# Patient Record
Sex: Male | Born: 1950 | Race: White | Hispanic: No | Marital: Married
Health system: Southern US, Academic
[De-identification: ages and names within clinical notes are randomized; demographics above are authoritative.]

## PROBLEM LIST (undated history)

## (undated) ENCOUNTER — Ambulatory Visit

## (undated) ENCOUNTER — Encounter

## (undated) ENCOUNTER — Telehealth

## (undated) ENCOUNTER — Encounter: Attending: Family | Primary: Family

## (undated) ENCOUNTER — Encounter: Payer: MEDICARE | Attending: Pulmonary Disease | Primary: Pulmonary Disease

## (undated) ENCOUNTER — Ambulatory Visit: Payer: MEDICARE | Attending: Internal Medicine | Primary: Internal Medicine

## (undated) ENCOUNTER — Encounter: Attending: Internal Medicine | Primary: Internal Medicine

## (undated) ENCOUNTER — Encounter: Attending: Pulmonary Disease | Primary: Pulmonary Disease

## (undated) ENCOUNTER — Telehealth: Attending: Pulmonary Disease | Primary: Pulmonary Disease

## (undated) ENCOUNTER — Ambulatory Visit: Payer: MEDICARE

## (undated) ENCOUNTER — Ambulatory Visit: Payer: MEDICARE | Attending: Family | Primary: Family

## (undated) ENCOUNTER — Telehealth: Attending: Registered" | Primary: Registered"

## (undated) ENCOUNTER — Encounter
Payer: MEDICARE | Attending: Student in an Organized Health Care Education/Training Program | Primary: Student in an Organized Health Care Education/Training Program

## (undated) ENCOUNTER — Telehealth: Attending: Family | Primary: Family

## (undated) ENCOUNTER — Ambulatory Visit: Payer: MEDICARE | Attending: Registered" | Primary: Registered"

## (undated) ENCOUNTER — Non-Acute Institutional Stay: Payer: MEDICARE

## (undated) ENCOUNTER — Non-Acute Institutional Stay: Payer: MEDICARE | Attending: Clinical | Primary: Clinical

## (undated) ENCOUNTER — Ambulatory Visit: Payer: MEDICARE | Attending: Pulmonary Disease | Primary: Pulmonary Disease

## (undated) ENCOUNTER — Ambulatory Visit: Payer: Medicare (Managed Care)

## (undated) ENCOUNTER — Encounter: Payer: MEDICARE | Attending: Internal Medicine | Primary: Internal Medicine

## (undated) ENCOUNTER — Encounter
Attending: Pharmacist Clinician (PhC)/ Clinical Pharmacy Specialist | Primary: Pharmacist Clinician (PhC)/ Clinical Pharmacy Specialist

## (undated) ENCOUNTER — Telehealth: Attending: Physician Assistant | Primary: Physician Assistant

## (undated) DIAGNOSIS — E119 Type 2 diabetes mellitus without complications: Secondary | ICD-10-CM

## (undated) HISTORY — PX: OTHER SURGICAL HISTORY: SHX169

## (undated) MED ORDER — IBUPROFEN 200 MG TABLET
Freq: Four times a day (QID) | ORAL | 0 days | PRN
Start: ? — End: 2020-05-28

---
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
## 2014-11-29 ENCOUNTER — Emergency Department
Admission: EM | Admit: 2014-11-29 | Discharge: 2014-11-29 | Disposition: A | Discharge status: Home / Self Care | Payer: BC Managed Care – PPO | Attending: Emergency Medicine | Admitting: Emergency Medicine

## 2014-11-29 ENCOUNTER — Emergency Department: Payer: BC Managed Care – PPO

## 2014-11-29 DIAGNOSIS — W010XXA Fall on same level from slipping, tripping and stumbling without subsequent striking against object, initial encounter: Secondary | ICD-10-CM | POA: Diagnosis not present

## 2014-11-29 DIAGNOSIS — Y9389 Activity, other specified: Secondary | ICD-10-CM | POA: Insufficient documentation

## 2014-11-29 DIAGNOSIS — D329 Benign neoplasm of meninges, unspecified: Secondary | ICD-10-CM | POA: Insufficient documentation

## 2014-11-29 DIAGNOSIS — Y998 Other external cause status: Secondary | ICD-10-CM | POA: Insufficient documentation

## 2014-11-29 DIAGNOSIS — S43015A Anterior dislocation of left humerus, initial encounter: Secondary | ICD-10-CM | POA: Insufficient documentation

## 2014-11-29 DIAGNOSIS — Z87891 Personal history of nicotine dependence: Secondary | ICD-10-CM | POA: Insufficient documentation

## 2014-11-29 DIAGNOSIS — S43005A Unspecified dislocation of left shoulder joint, initial encounter: Secondary | ICD-10-CM

## 2014-11-29 DIAGNOSIS — E119 Type 2 diabetes mellitus without complications: Secondary | ICD-10-CM | POA: Insufficient documentation

## 2014-11-29 DIAGNOSIS — Y92009 Unspecified place in unspecified non-institutional (private) residence as the place of occurrence of the external cause: Secondary | ICD-10-CM | POA: Diagnosis not present

## 2014-11-29 DIAGNOSIS — S4992XA Unspecified injury of left shoulder and upper arm, initial encounter: Secondary | ICD-10-CM | POA: Diagnosis present

## 2014-11-29 HISTORY — DX: Type 2 diabetes mellitus without complications: E11.9

## 2014-11-29 MED ORDER — ONDANSETRON HCL 4 MG/2ML IJ SOLN
INTRAMUSCULAR | Status: AC
  Administered 2014-11-29: 4 mg via INTRAVENOUS
  Filled 2014-11-29: qty 2

## 2014-11-29 MED ORDER — OXYCODONE-ACETAMINOPHEN 5-325 MG PO TABS: 1.0000 | ORAL_TABLET | ORAL | Status: AC | PRN

## 2014-11-29 MED ORDER — ONDANSETRON HCL 4 MG/2ML IJ SOLN
4.0000 mg | Freq: Once | INTRAMUSCULAR | Status: AC
  Administered 2014-11-29: 4 mg via INTRAVENOUS

## 2014-11-29 MED ORDER — MORPHINE SULFATE 2 MG/ML IJ SOLN
INTRAMUSCULAR | Status: AC
  Administered 2014-11-29: 2 mg via INTRAVENOUS
  Filled 2014-11-29: qty 1

## 2014-11-29 MED ORDER — MORPHINE SULFATE 2 MG/ML IJ SOLN
2.0000 mg | Freq: Once | INTRAMUSCULAR | Status: AC
  Administered 2014-11-29: 2 mg via INTRAVENOUS

## 2014-11-29 NOTE — Discharge Instructions (Signed)
Shoulder Dislocation Your shoulder is made up of three bones: the collar bone (clavicle); the shoulder blade (scapula), which includes the socket (glenoid cavity); and the upper arm bone (humerus). Your shoulder joint is the place where these bones meet. Strong, fibrous tissues hold these bones together (ligaments). Muscles and strong, fibrous tissues that connect the muscles to these bones (tendons) allow your arm to move through this joint. The range of motion of your shoulder joint is more extensive than most of your other joints, and the glenoid cavity is very shallow. That is the reason that your shoulder joint is one of the most unstable joints in your body. It is far more prone to dislocation than your other joints. Shoulder dislocation is when your humerus is forced out of your shoulder joint. CAUSES Shoulder dislocation is caused by a forceful impact on your shoulder. This impact usually is from an injury, such as a sports injury or a fall. SYMPTOMS Symptoms of shoulder dislocation include:  Deformity of your shoulder.  Intense pain.  Inability to move your shoulder joint.  Numbness, weakness, or tingling around your shoulder joint (your neck or down your arm).  Bruising or swelling around your shoulder. DIAGNOSIS In order to diagnose a dislocated shoulder, your caregiver will perform a physical exam. Your caregiver also may have an X-ray exam done to see if you have any broken bones. Magnetic resonance imaging (MRI) is a procedure that sometimes is done to help your caregiver see any damage to the soft tissues around your shoulder, particularly your rotator cuff tendons. Additionally, your caregiver also may have electromyography done to measure the electrical discharges produced in your muscles if you have signs or symptoms of nerve damage. TREATMENT A shoulder dislocation is treated by placing the humerus back in the joint (reduction). Your caregiver does this either manually (closed  reduction), by moving your humerus back into the joint through manipulation, or through surgery (open reduction). When your humerus is back in place, severe pain should improve almost immediately. You also may need to have surgery if you have a weak shoulder joint or ligaments, and you have recurring shoulder dislocations, despite rehabilitation. In rare cases, surgery is necessary if your nerves or blood vessels are damaged during the dislocation. After your reduction, your arm will be placed in a shoulder immobilizer or sling to keep it from moving. Your caregiver will have you wear your shoulder immobilizer or sling for 3 days to 3 weeks, depending on how serious your dislocation is. When your shoulder immobilizer or sling is removed, your caregiver may prescribe physical therapy to help improve the range of motion in your shoulder joint. HOME CARE INSTRUCTIONS  The following measures can help to reduce pain and speed up the healing process:  Rest your injured joint. Do not move it. Avoid activities similar to the one that caused your injury.  Apply ice to your injured joint for the first day or two after your reduction or as directed by your caregiver. Applying ice helps to reduce inflammation and pain.  Put ice in a plastic bag.  Place a towel between your skin and the bag.  Leave the ice on for 15-20 minutes at a time, every 2 hours while you are awake.  Exercise your hand by squeezing a soft ball. This helps to eliminate stiffness and swelling in your hand and wrist.  Take over-the-counter or prescription medicine for pain or discomfort as told by your caregiver. SEEK IMMEDIATE MEDICAL CARE IF:   Your  shoulder immobilizer or sling becomes damaged.  Your pain becomes worse rather than better.  You lose feeling in your arm or hand, or they become white and cold. MAKE SURE YOU:   Understand these instructions.  Will watch your condition.  Will get help right away if you are not  doing well or get worse. Document Released: 01/19/2001 Document Revised: 09/10/2013 Document Reviewed: 02/14/2011 [facility] Patient Information 2015 [place], [place]. This information is not intended to replace advice given to you by your health care provider. Make sure you discuss any questions you have with your health care provider.  Meningioma Meningioma is a tumor that occurs in the thin tissue that covers the brain and spinal cord (meninges). Meningiomas are usually benign, which means they are not cancerous and do not spread to other areas. In rare cases, a meningioma may become cancerous (malignant). Older women have a higher risk of having meningiomas. However, men have a higher risk of having a meningioma that is malignant. RISK FACTORS People who have had radiation exposure in the past may have an increased risk of developing this type of tumor. People who have neurofibromatosis 2 may also have an increased risk of meningioma. Older women have a higher risk of meningiomas than men or children. However, men have a higher risk of meningiomas that are malignant. SIGNS AND SYMPTOMS Symptoms of meningioma usually begin very slowly. The symptoms may depend on the size and location of the tumor. Possible symptoms include:   Headaches.  Nausea and vomiting.  Vision changes.  Hearing changes.   Loss of the sense of smell.  Seizures.   Weakness or numbness on one side of the body or in an arm or leg.   Mood changes.   Problems with memory or thinking.  DIAGNOSIS  Brain tumors can usually be seen on brain imaging, such as CT scan or MRI. A sample of the tumor will need to be studied in a laboratory (biopsy) to confirm the diagnosis of meningioma. Information about the tumor cells also helps guide treatment. TREATMENT  Because meningioma is so slow growing, treatment is often delayed until symptoms affect daily activities. Regular monitoring is performed to track the tumor's  growth.  There are several ways that meningioma is treated:  Surgery to remove as much of the tumor as possible.  High-energy rays (radiation therapy) to help shrink or kill the tumor.  Chemotherapy to shrink or kill the tumor. Because normal cells may also be killed, chemotherapy has many side effects.  Targeted therapy, using substances that injure or kill cancer cells without affecting normal cells.  Steroid medicine to decrease brain swelling and improve symptoms. HOME CARE INSTRUCTIONS  Take all medicines as directed by your health care provider.  Go to all follow-up appointments. SEEK MEDICAL CARE IF:  Any symptoms come back.  You have diarrhea, throw up (vomit), or have abdominal pain.  You cannot eat or drink as much as you need.  You are more weak or tired than usual.   You are losing weight without trying. SEEK IMMEDIATE MEDICAL CARE IF:  Your diarrhea, vomiting, or abdominal pain does not go away.  You have new symptoms, such as vision problems or difficulty walking.   You have a seizure.   You have bleeding that does not stop.   You have trouble breathing.   You have a fever.  Document Released: 05/01/2013 Document Reviewed: 05/01/2013 [facility] Patient Information 2015 [place], [place]. This information is not intended to replace advice given  to you by your health care provider. Make sure you discuss any questions you have with your health care provider.

## 2014-11-29 NOTE — ED Notes (Signed)
Pt returned from CT °

## 2014-11-29 NOTE — ED Provider Notes (Signed)
[facility] Emergency Department Provider Note  ____________________________________________  Time seen: 1:00 AM  I have reviewed the triage vital signs and the nursing notes.   HISTORY  Chief Complaint Shoulder Injury      HPI Samuel Morgan is a 64 y.o. male presents with history of slipping on water at home with left shoulder injury. Patient states current pain score is 10 out of 10. Patient admits to drinking approximately 14 beers over the course of today. Patient admits to brief loss of consciousness status post fall    Past Medical History  Diagnosis Date  . Diabetes mellitus without complication     There are no active problems to display for this patient.   Past Surgical History  Procedure Laterality Date  . Liver cancer    . Colon cancer      No current outpatient prescriptions on file.  Allergies Review of patient's allergies indicates not on file.  No family history on file.  Social History History  Substance Use Topics  . Smoking status: Former Smoker    Quit date: 11/28/1989  . Smokeless tobacco: Not on file  . Alcohol Use: 8.4 oz/week    14 Cans of beer per week    Review of Systems  Constitutional: Negative for fever. Eyes: Negative for visual changes. ENT: Negative for sore throat. Cardiovascular: Negative for chest pain. Respiratory: Negative for shortness of breath. Gastrointestinal: Negative for abdominal pain, vomiting and diarrhea. Genitourinary: Negative for dysuria. Musculoskeletal: Positive left shoulder pain Skin: Negative for rash. Neurological: Negative for headaches, focal weakness or numbness.   10-point ROS otherwise negative.  ____________________________________________   PHYSICAL EXAM:  VITAL SIGNS: ED Triage Vitals  Enc Vitals Group     BP --      Pulse Rate 11/29/14 0036 113     Resp 11/29/14 0036 18     Temp 11/29/14 0036 98.8 F (37.1 C)     Temp Source 11/29/14 0036 Oral      SpO2 11/29/14 0036 93 %     Weight 11/29/14 0036 205 lb (92.987 kg)     Height 11/29/14 0036 5\' 8"  (1.727 m)     Head Cir --      Peak Flow --      Pain Score 11/29/14 0038 10     Pain Loc --      Pain Edu? --      Excl. in [place]? --      Constitutional: Alert and oriented. Well appearing and in no distress. Eyes: Conjunctivae are normal. PERRL. Normal extraocular movements. ENT   Head: Normocephalic and atraumatic.   Nose: No congestion/rhinnorhea.   Mouth/Throat: Mucous membranes are moist.   Neck: No stridor. Cardiovascular: Normal rate, regular rhythm. Normal and symmetric distal pulses are present in all extremities. No murmurs, rubs, or gallops. Respiratory: Normal respiratory effort without tachypnea nor retractions. Breath sounds are clear and equal bilaterally. No wheezes/rales/rhonchi. Gastrointestinal: Soft and nontender. No distention. There is no CVA tenderness. Genitourinary: deferred Musculoskeletal: Nontender with normal range of motion in all extremities. No joint effusions.  No lower extremity tenderness nor edema. Neurologic:  Normal speech and language. No gross focal neurologic deficits are appreciated. Speech is normal.  Skin:  Skin is warm, dry and intact. No rash noted. Psychiatric: Mood and affect are normal. Speech and behavior are normal. Patient exhibits appropriate insight and judgment.      RADIOLOGY Left shoulder x-ray revealed:    DG Shoulder Left (Final result) Result time:  11/29/14 02:05:42   Final result by Rad Results In Interface (11/29/14 02:05:42)   Narrative:   CLINICAL DATA: Fell on left shoulder, with left shoulder pain. Initial encounter.  EXAM: LEFT SHOULDER - 2+ VIEW  COMPARISON: None.  FINDINGS: There is anterior dislocation of the left humeral head. No definite Hill-Sachs or osseous Bankart lesion is characterized.  The left acromioclavicular joint demonstrates minimal degenerative change. Mild atelectasis  or scarring is noted within the left lung. No definite soft tissue abnormalities are characterized on radiograph.  IMPRESSION: Anterior dislocation of the left humeral head. No definite osseous Bankart lesion or Hill-Sachs lesion seen.   Electronically Signed By: Garald Balding M.D. On: 11/29/2014 02:05            CT head revealed: CT Head Wo Contrast (Final result) Result time: 11/29/14 04:00:58   Final result by Rad Results In Interface (11/29/14 04:00:58)   Narrative:   CLINICAL DATA: Initial evaluation for acute trauma, fall.  EXAM: CT HEAD WITHOUT CONTRAST  TECHNIQUE: Contiguous axial images were obtained from the base of the skull through the vertex without intravenous contrast.  COMPARISON: None.  FINDINGS: Diffuse prominence of the CSF containing spaces is compatible with generalized cerebral atrophy. Patchy hypodensity within thLeft shouldere periventricular and deep white matter of both cerebral hemispheres most consistent with chronic small vessel ischemic disease.  No acute large vessel territory infarct. No intracranial hemorrhage.  There is a hyperdense well-circumscribed mass that measures 2.2 x 1.8 x 1.8 cm overlying the anterior re- or right frontal lobe (series 2, image 19). While this lesion appears somewhat mild long and pointed on axial sequence, this of P ears fairly round and ovoid on coronal and sagittal sequence. This is favored to be extra-axial in nature, and is most consistent with a meningioma. There is minimal associated edema within the subjacent right frontal lobe without significant regional mass effect. No other mass lesion.  No midline shift. No hydrocephalus. No extra-axial fluid collection.  Scalp soft tissues within normal limits. No acute abnormality about the orbits. Sequelae of prior lens extraction seen within the left globe.  Calvarium intact.  Mild mucosal thickening within the visualized ethmoidal air  cells. Paranasal sinuses are otherwise clear. No mastoid effusion. Middle ear cavities are clear.  IMPRESSION: 1. No acute intracranial process. 2. 2.2 x 1.8 x 1.8 cm well-circumscribed hyperdense mass overlying the anterior right frontal lobe, most consistent with a meningioma. There is minimal edema within the subjacent right frontal lobe without significant regional mass effect. 3. 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       Procedure:Reduction of dislocation Date/Time: 4:16 AM Performed by: Gregor Hams Authorized by: Gregor Hams Consent: Verbal consent obtained. Risks and benefits: risks, benefits and alternatives were discussed Consent given by: patient Required items: required blood products, implants, devices, and special equipment available Time out: Immediately prior to procedure a "time out" was called to verify the correct patient, procedure, equipment, support staff and site/side marked as required.  Patient sedated: no  Vitals: Vital signs were monitored during sedation. Patient tolerance: Patient tolerated the procedure well with no immediate complications. Joint: left shoulder Reduction technique: traction-counter traction     INITIAL IMPRESSION / ASSESSMENT AND PLAN / ED COURSE  Pertinent labs & imaging results that were available during my care of the patient were reviewed by me and considered in my medical decision making (see chart for details). Patient received IV morphine with improvement in pain. Shoulder reduction performed via  traction countertraction technique as Milch technique was successful. Postreduction x-ray showed successful reduction of the left humeral head.   ____________________________________________   FINAL CLINICAL IMPRESSION(S) / ED DIAGNOSES  Final diagnoses:  Shoulder dislocation, left, initial encounter  Meningioma      Gregor Hams, MD 11/29/14 [phone]

## 2014-11-29 NOTE — ED Notes (Signed)
Patient slipped on water at home and patient fell onto left shoulder. Presents with left shoulder lower than right and pain.

## 2014-11-29 NOTE — ED Notes (Signed)
Patient reports drinking approximately 14 beers over the course of the day.

## 2016-08-13 IMAGING — CT CT HEAD W/O CM
3 of 4 series · 16 of 47 positions shown, 19 images · non-contrast
Comparison: None.

CLINICAL DATA: Initial evaluation for acute trauma, fall.

EXAM:
CT HEAD WITHOUT CONTRAST
TECHNIQUE: Contiguous axial images were obtained from the base of the skull
through the vertex without intravenous contrast.

[Series 4: head wo recons 1.5 · axial · 0.44mm/px · z∈[+429,+569]mm · 10 of 108 slices shown, 13 images]
[im 11/108  brain]
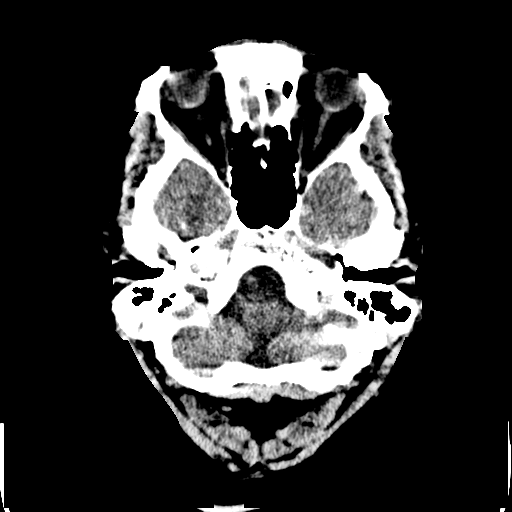
[im 11/108  bone]
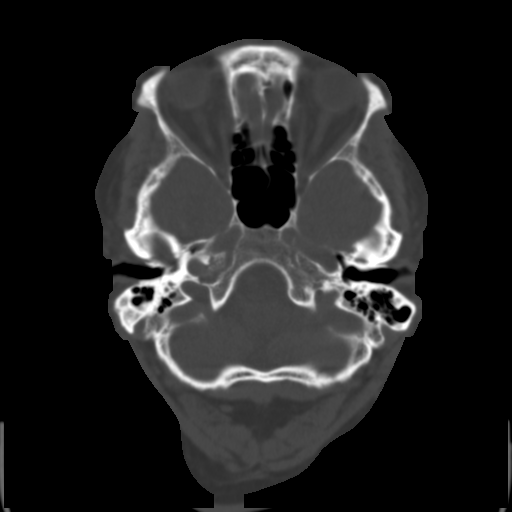
[im 21/108  brain]
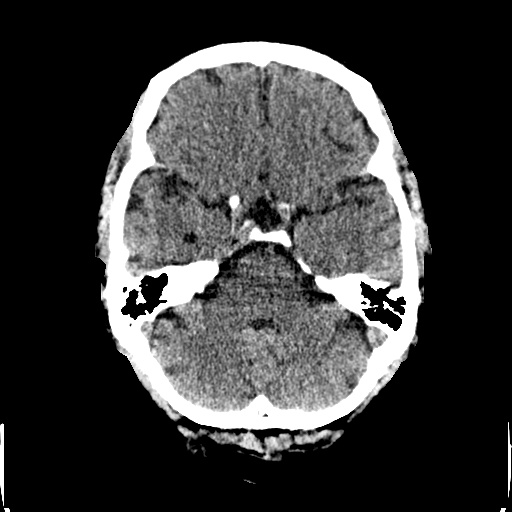
[im 31/108  brain]
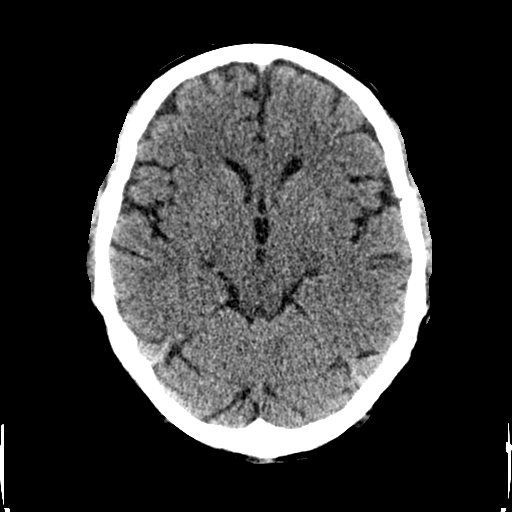
[im 41/108  brain]
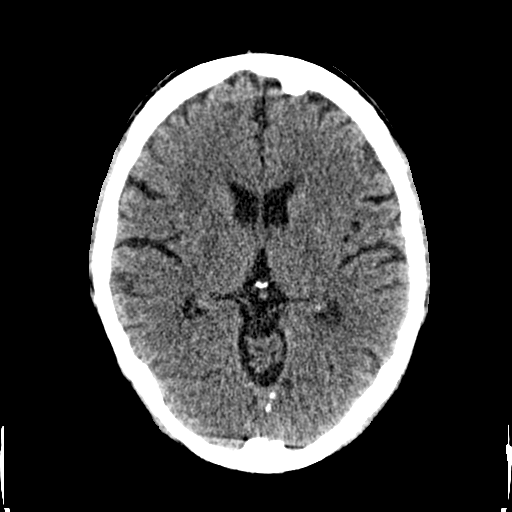
[im 51/108  brain]
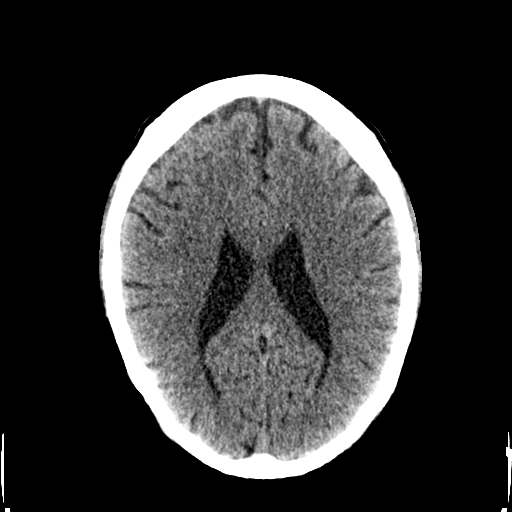
[im 51/108  bone]
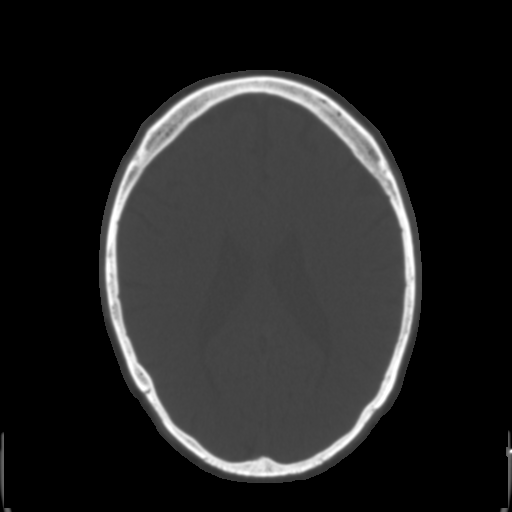
[im 62/108  brain]
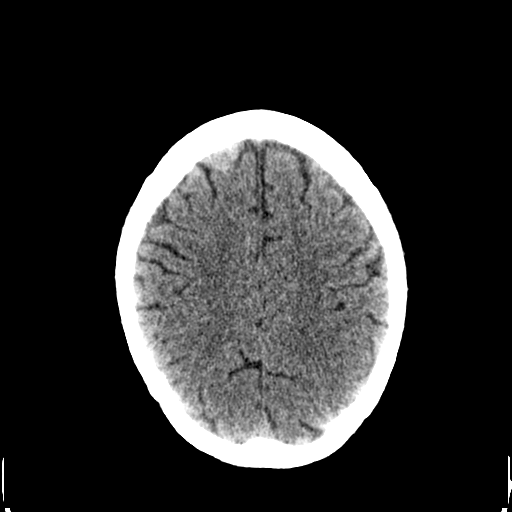
[im 72/108  brain]
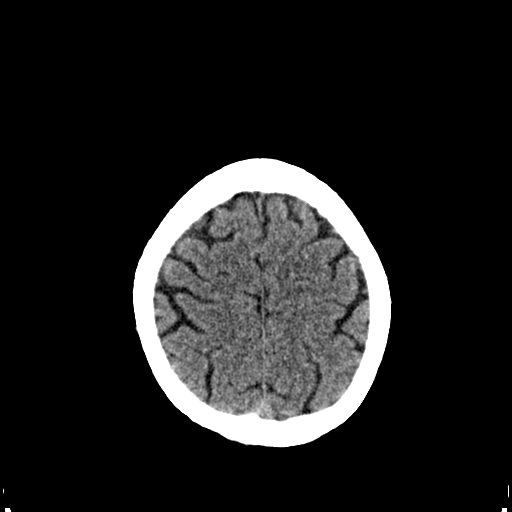
[im 82/108  brain]
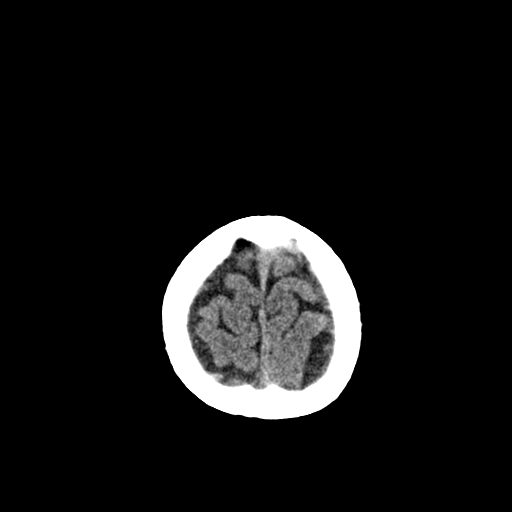
[im 92/108  brain]
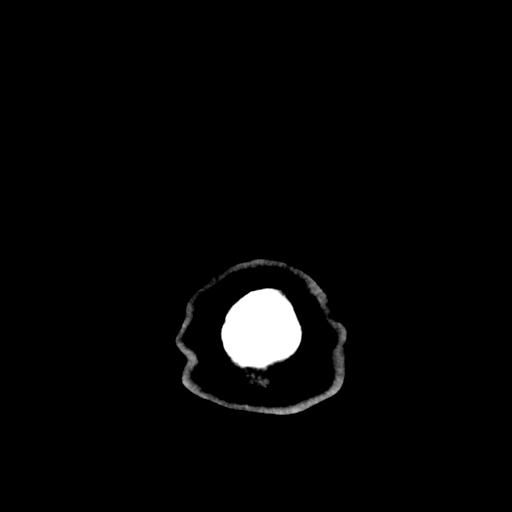
[im 92/108  bone]
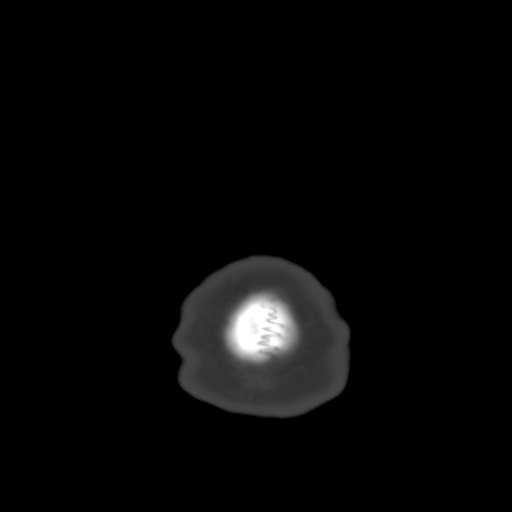
[im 102/108  brain]
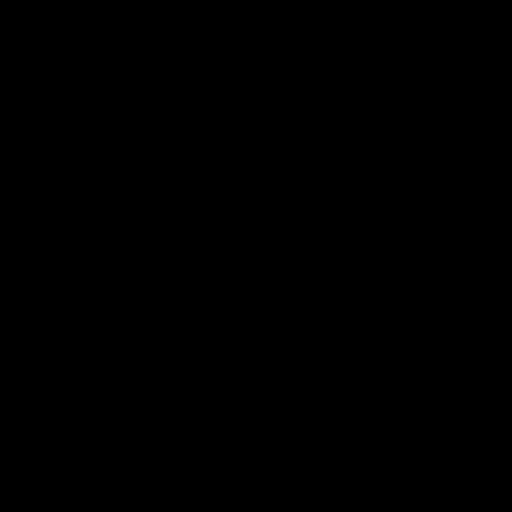

[Series 602: coronals · coronal · 0.52mm/px · 3 of 118 slices shown]
[im 40/118  brain]
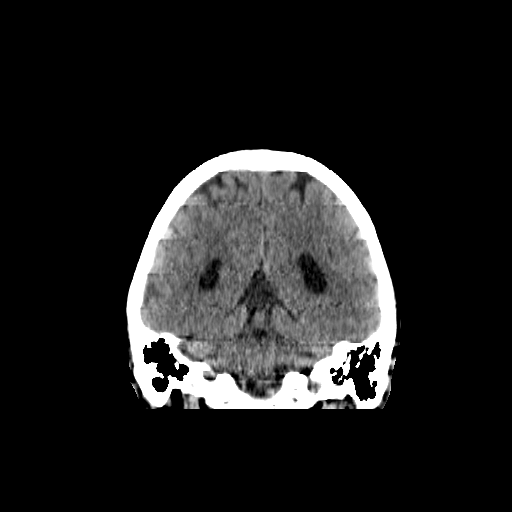
[im 53/118  brain]
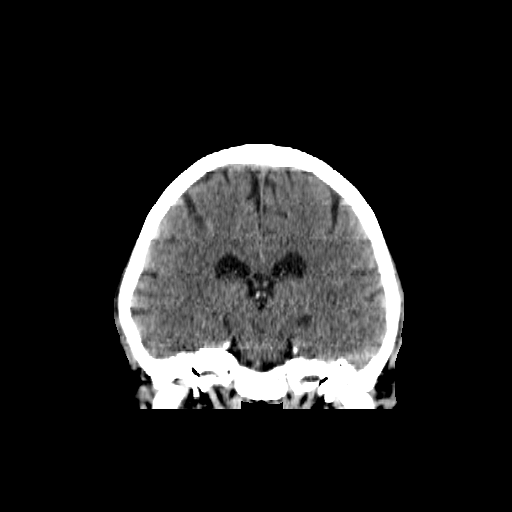
[im 66/118  brain]
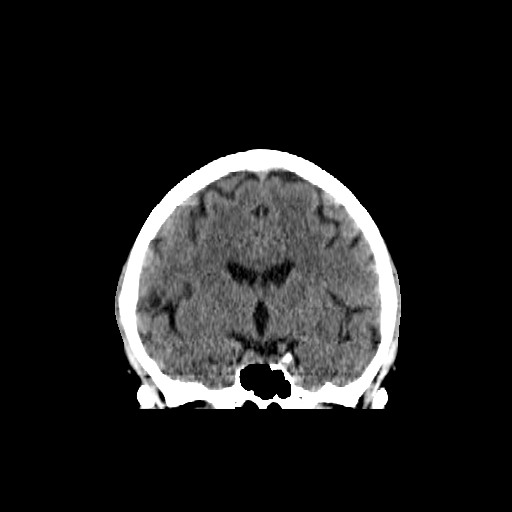

[Series 603: sags · sagittal · 0.52mm/px · 3 of 94 slices shown]
[im 32/94  brain]
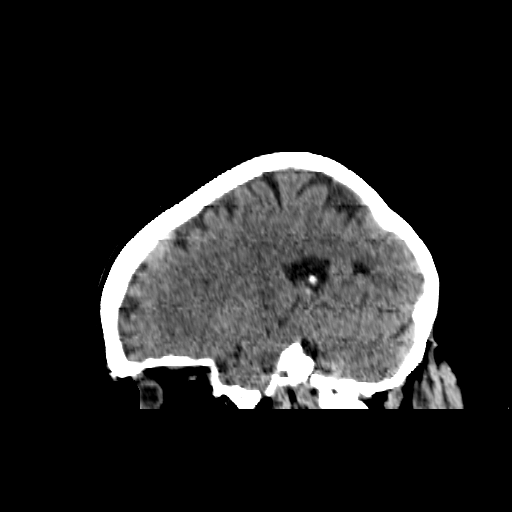
[im 47/94  brain]
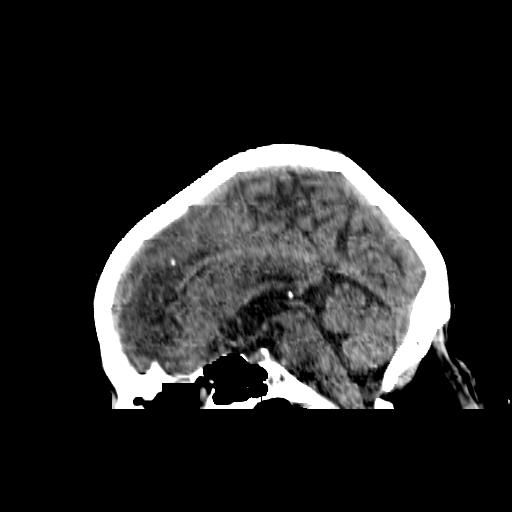
[im 63/94  brain]
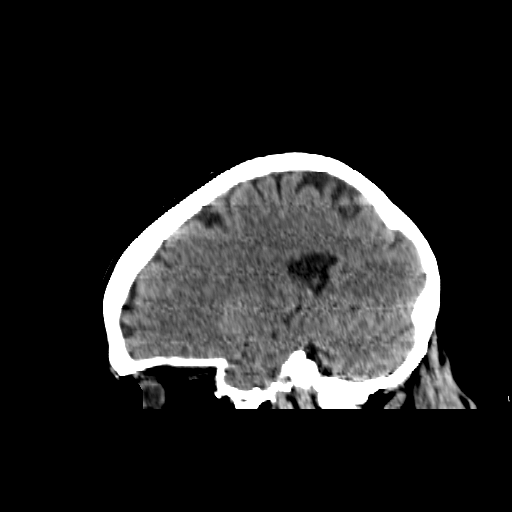

[16 of 47 positions shown; findings below may reference images not displayed]

FINDINGS: Diffuse prominence of the CSF containing spaces is compatible with
generalized cerebral atrophy. Patchy hypodensity within the
periventricular and deep white matter of both cerebral hemispheres
most consistent with chronic small vessel ischemic disease.

No acute large vessel territory infarct. No intracranial hemorrhage.

There is a hyperdense well-circumscribed mass that measures 2.2 x
1.8 x 1.8 cm overlying the anterior re- or right frontal lobe
(series 2, image 19). While this lesion appears somewhat mild long
and pointed on axial sequence, this of P ears fairly round and ovoid
on coronal and sagittal sequence. This is favored to be extra-axial
in nature, and is most consistent with a meningioma. There is
minimal associated edema within the subjacent right frontal lobe
without significant regional mass effect. No other mass lesion.

No midline shift. No hydrocephalus. No extra-axial fluid collection.

Scalp soft tissues within normal limits. No acute abnormality about
the orbits. Sequelae of prior lens extraction seen within the left
globe.

Calvarium intact.

Mild mucosal thickening within the visualized ethmoidal air cells.
Paranasal sinuses are otherwise clear. No mastoid effusion. Middle
ear cavities are clear.
IMPRESSION: 1. No acute intracranial process.
2. 2.2 x 1.8 x 1.8 cm well-circumscribed hyperdense mass overlying
the anterior right frontal lobe, most consistent with a meningioma.
There is minimal edema within the subjacent right frontal lobe
without significant regional mass effect.
3. Generalized cerebral atrophy with chronic microvascular ischemic
disease.

## 2016-08-13 IMAGING — CR DG SHOULDER 2+V*L*
3 series · 3 of 3 positions shown · non-contrast
Comparison: None.

CLINICAL DATA: Fell on left shoulder, with left shoulder pain.
Initial encounter.

EXAM:
LEFT SHOULDER - 2+ VIEW

[shoulder grashey (1 of 2)]
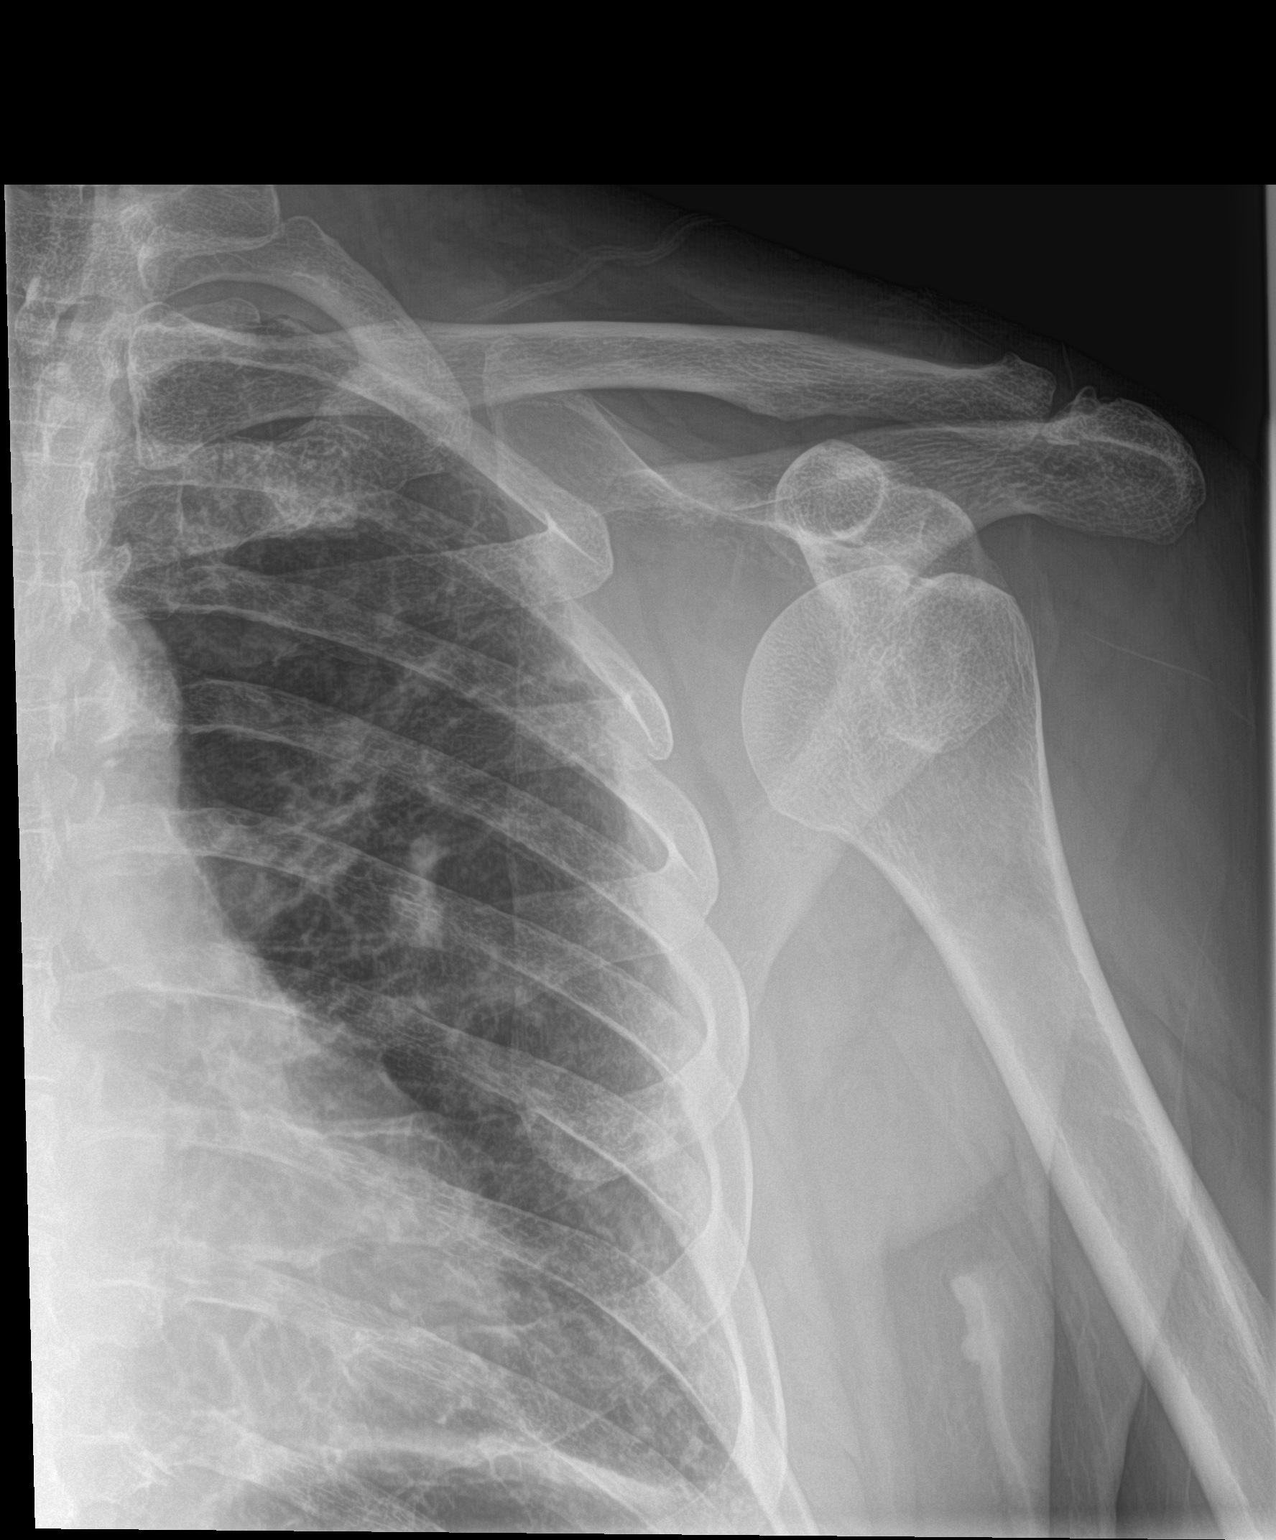

[shoulder y view]
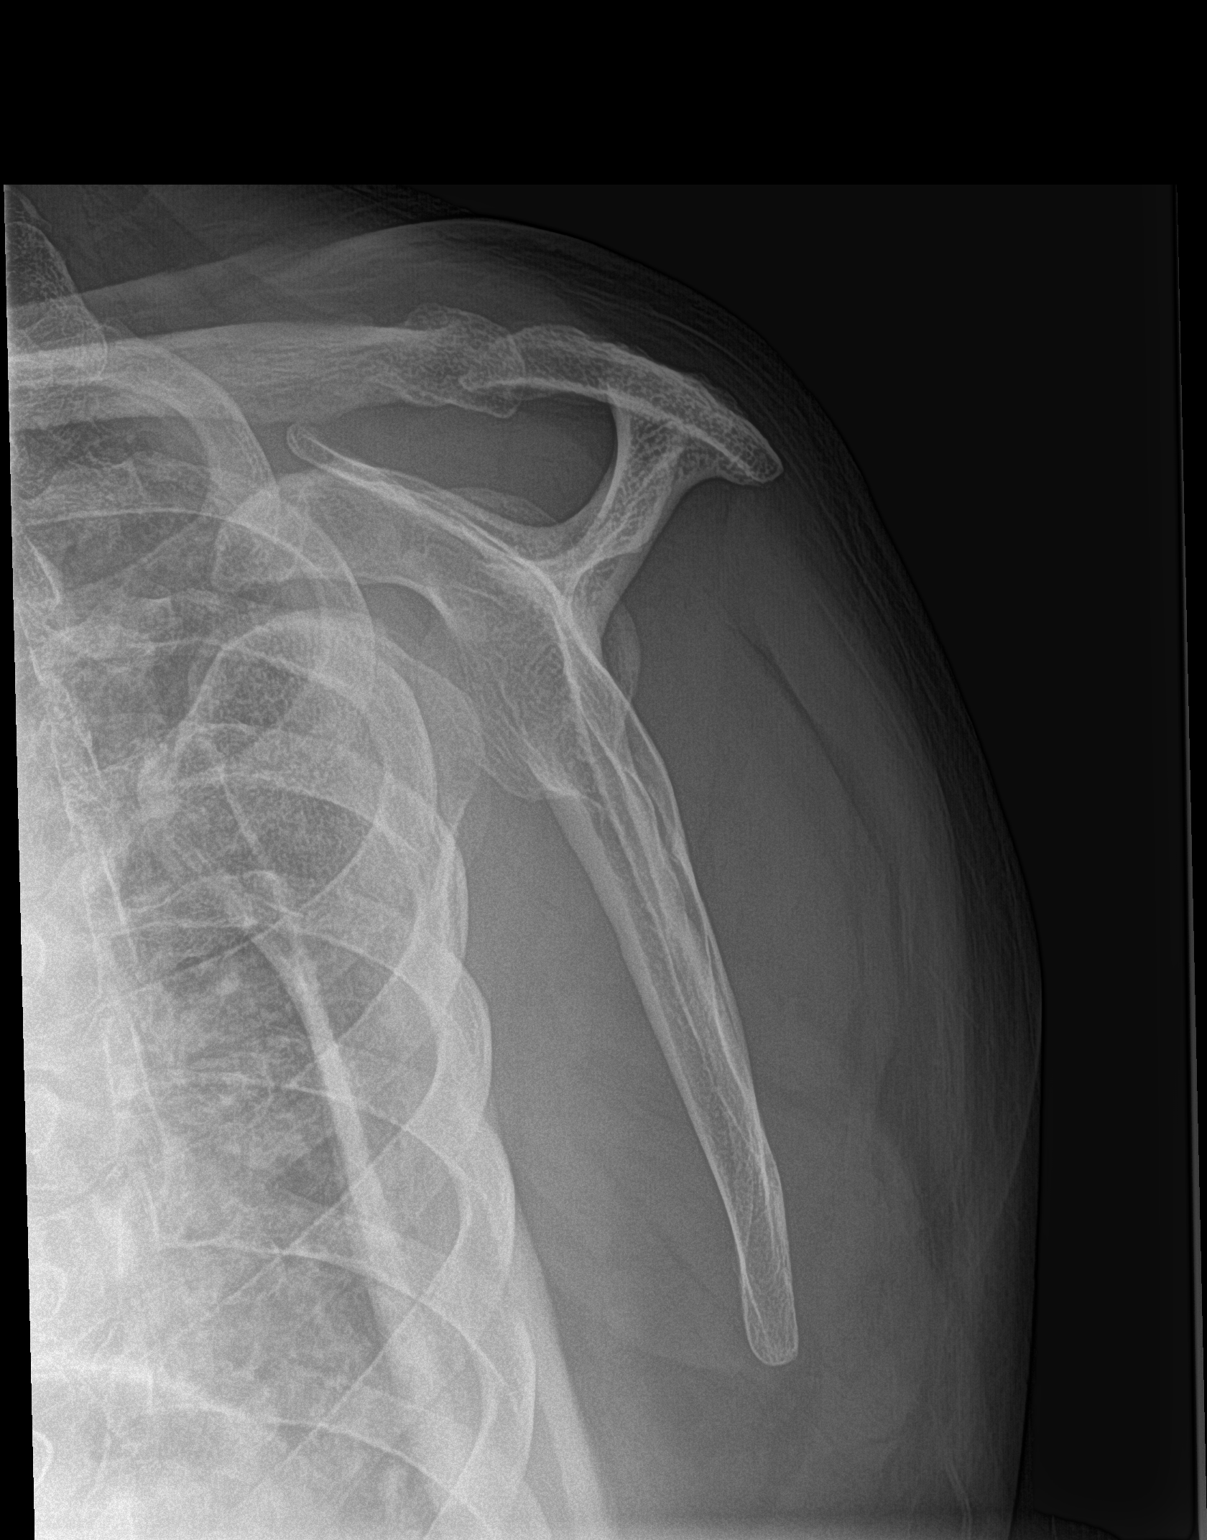

[shoulder grashey (2 of 2)]
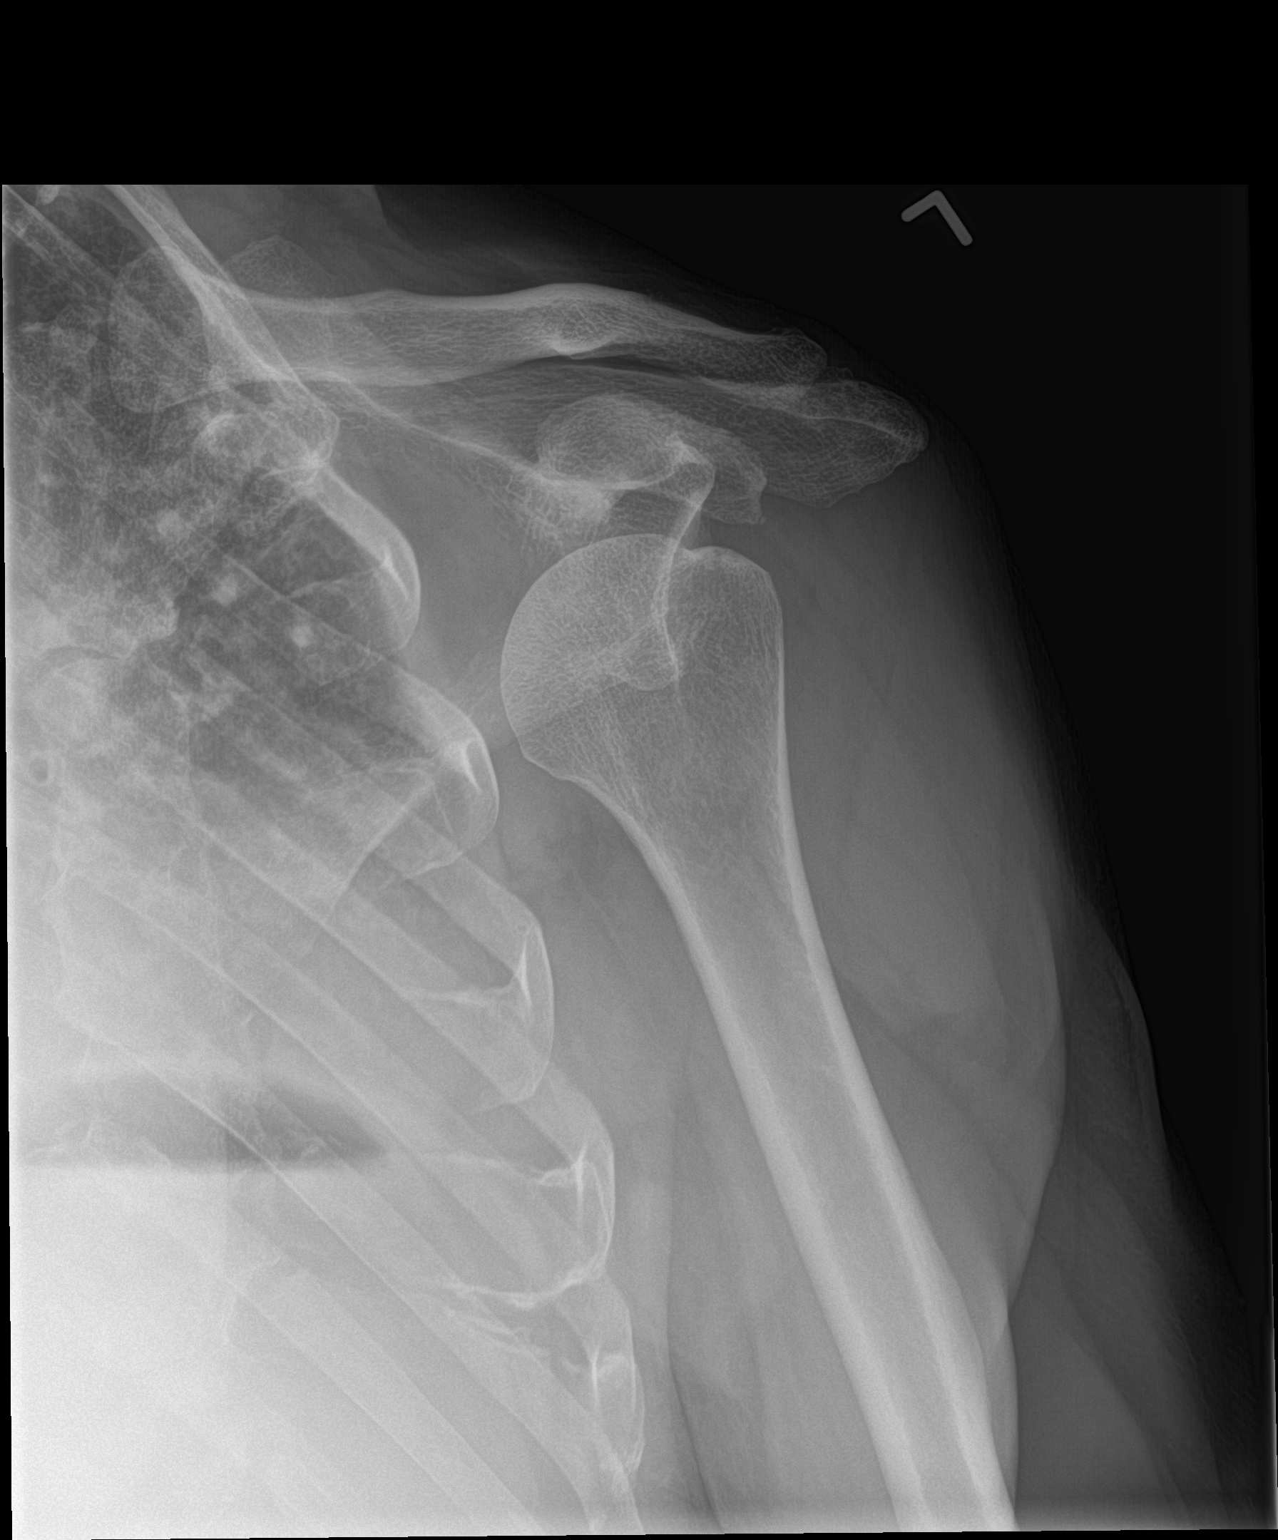

[3 of 3 positions shown; findings below may reference images not displayed]

FINDINGS: There is anterior dislocation of the left humeral head. No definite
Hill-Sachs or osseous Bankart lesion is characterized.

The left acromioclavicular joint demonstrates minimal degenerative
change. Mild atelectasis or scarring is noted within the left lung.
No definite soft tissue abnormalities are characterized on
radiograph.
IMPRESSION: Anterior dislocation of the left humeral head. No definite osseous
Bankart lesion or Hill-Sachs lesion seen.

## 2016-12-08 ENCOUNTER — Ambulatory Visit: Admission: RE | Admit: 2016-12-08 | Discharge: 2016-12-08 | Disposition: A | Discharge status: Home / Self Care

## 2016-12-08 DIAGNOSIS — Z77090 Contact with and (suspected) exposure to asbestos: Principal | ICD-10-CM

## 2016-12-17 ENCOUNTER — Ambulatory Visit: Admission: RE | Admit: 2016-12-17 | Discharge: 2016-12-17 | Disposition: A | Discharge status: Home / Self Care

## 2016-12-17 DIAGNOSIS — Z0000 Encounter for general adult medical examination without abnormal findings: Principal | ICD-10-CM

## 2016-12-17 MED ORDER — ENALAPRIL MALEATE 20 MG TABLET
ORAL_TABLET | Freq: Two times a day (BID) | ORAL | 1 refills | 0.00000 days | Status: CP
Start: 2016-12-17 — End: 2017-06-21

## 2016-12-17 MED ORDER — METFORMIN 1,000 MG TABLET
ORAL_TABLET | Freq: Two times a day (BID) | ORAL | 1 refills | 0 days | Status: CP
Start: 2016-12-17 — End: 2017-06-21

## 2016-12-17 MED ORDER — HYDROCHLOROTHIAZIDE 25 MG TABLET
ORAL_TABLET | Freq: Every day | ORAL | 1 refills | 0 days | Status: CP
Start: 2016-12-17 — End: 2017-06-21

## 2016-12-28 ENCOUNTER — Ambulatory Visit
Admission: RE | Admit: 2016-12-28 | Discharge: 2016-12-28 | Disposition: A | Discharge status: Home / Self Care | Attending: Anesthesiology | Admitting: Anesthesiology

## 2016-12-28 ENCOUNTER — Ambulatory Visit
Admission: RE | Admit: 2016-12-28 | Discharge: 2016-12-28 | Disposition: A | Discharge status: Home / Self Care | Attending: Gastroenterology | Admitting: Gastroenterology

## 2017-01-11 ENCOUNTER — Ambulatory Visit: Admission: RE | Admit: 2017-01-11 | Discharge: 2017-01-11 | Admitting: Ophthalmology

## 2017-01-11 DIAGNOSIS — H43813 Vitreous degeneration, bilateral: Secondary | ICD-10-CM

## 2017-01-11 DIAGNOSIS — E119 Type 2 diabetes mellitus without complications: Principal | ICD-10-CM

## 2017-01-11 DIAGNOSIS — H269 Unspecified cataract: Secondary | ICD-10-CM

## 2017-01-11 MED ORDER — SILDENAFIL (PULMONARY HYPERTENSION) 20 MG TABLET
ORAL_TABLET | 0 refills | 0 days | Status: CP
Start: 2017-01-11 — End: 2017-08-09

## 2017-02-11 ENCOUNTER — Ambulatory Visit: Admission: RE | Admit: 2017-02-11 | Discharge: 2017-02-11 | Attending: Internal Medicine | Admitting: Internal Medicine

## 2017-02-11 DIAGNOSIS — H00014 Hordeolum externum left upper eyelid: Principal | ICD-10-CM

## 2017-02-11 MED ORDER — ERYTHROMYCIN 5 MG/GRAM (0.5 %) EYE OINTMENT
Freq: Four times a day (QID) | OPHTHALMIC | 0 refills | 0.00000 days | Status: CP
Start: 2017-02-11 — End: 2017-02-21

## 2017-02-17 ENCOUNTER — Ambulatory Visit: Admission: RE | Admit: 2017-02-17 | Discharge: 2017-02-17

## 2017-02-17 DIAGNOSIS — H00014 Hordeolum externum left upper eyelid: Principal | ICD-10-CM

## 2017-03-23 MED ORDER — RANITIDINE 150 MG TABLET
ORAL_TABLET | 2 refills | 0 days | Status: CP
Start: 2017-03-23 — End: 2017-12-26

## 2017-06-21 MED ORDER — ENALAPRIL MALEATE 20 MG TABLET
ORAL_TABLET | Freq: Two times a day (BID) | ORAL | 1 refills | 0 days | Status: CP
Start: 2017-06-21 — End: 2017-12-30

## 2017-06-21 MED ORDER — LOVASTATIN 40 MG TABLET
ORAL_TABLET | Freq: Every day | ORAL | 2 refills | 0 days | Status: CP
Start: 2017-06-21 — End: 2017-12-30

## 2017-06-21 MED ORDER — HYDROCHLOROTHIAZIDE 25 MG TABLET
ORAL_TABLET | Freq: Every day | ORAL | 1 refills | 0 days | Status: CP
Start: 2017-06-21 — End: 2017-12-26

## 2017-06-21 MED ORDER — METFORMIN 1,000 MG TABLET
ORAL_TABLET | Freq: Two times a day (BID) | ORAL | 1 refills | 0 days | Status: CP
Start: 2017-06-21 — End: 2017-12-26

## 2017-08-09 MED ORDER — SILDENAFIL (PULMONARY HYPERTENSION) 20 MG TABLET: tablet | 0 refills | 0 days | Status: AC

## 2017-08-09 MED ORDER — SILDENAFIL (PULMONARY HYPERTENSION) 20 MG TABLET
ORAL_TABLET | ORAL | 0 refills | 0.00000 days | Status: CP
Start: 2017-08-09 — End: 2017-08-09

## 2017-09-26 MED ORDER — CLOBETASOL 0.05 % TOPICAL CREAM
Freq: Two times a day (BID) | TOPICAL | 1 refills | 0.00000 days | Status: CP
Start: 2017-09-26 — End: 2018-12-26

## 2017-12-21 ENCOUNTER — Encounter: Admit: 2017-12-21 | Discharge: 2017-12-22 | Payer: MEDICARE

## 2017-12-21 DIAGNOSIS — Z0289 Encounter for other administrative examinations: Principal | ICD-10-CM

## 2017-12-21 DIAGNOSIS — Z77090 Contact with and (suspected) exposure to asbestos: Secondary | ICD-10-CM

## 2017-12-26 MED ORDER — HYDROCHLOROTHIAZIDE 25 MG TABLET
ORAL_TABLET | 0 refills | 0 days | Status: CP
Start: 2017-12-26 — End: 2017-12-30

## 2017-12-26 MED ORDER — RANITIDINE 150 MG TABLET
ORAL_TABLET | 0 refills | 0 days | Status: CP
Start: 2017-12-26 — End: 2018-01-25

## 2017-12-26 MED ORDER — METFORMIN 1,000 MG TABLET
ORAL_TABLET | 0 refills | 0 days | Status: CP
Start: 2017-12-26 — End: 2017-12-30

## 2017-12-27 MED ORDER — NAPROXEN 500 MG TABLET
ORAL_TABLET | 0 refills | 0 days | Status: CP
Start: 2017-12-27 — End: 2017-12-30

## 2017-12-30 ENCOUNTER — Encounter: Admit: 2017-12-30 | Discharge: 2017-12-31 | Payer: MEDICARE

## 2017-12-30 DIAGNOSIS — Z0000 Encounter for general adult medical examination without abnormal findings: Secondary | ICD-10-CM

## 2017-12-30 DIAGNOSIS — C189 Malignant neoplasm of colon, unspecified: Principal | ICD-10-CM

## 2017-12-30 DIAGNOSIS — E119 Type 2 diabetes mellitus without complications: Secondary | ICD-10-CM

## 2017-12-30 DIAGNOSIS — R9389 Abnormal findings on diagnostic imaging of other specified body structures: Secondary | ICD-10-CM

## 2017-12-30 DIAGNOSIS — M4800 Spinal stenosis, site unspecified: Secondary | ICD-10-CM

## 2017-12-30 DIAGNOSIS — I10 Essential (primary) hypertension: Secondary | ICD-10-CM

## 2017-12-30 DIAGNOSIS — E782 Mixed hyperlipidemia: Secondary | ICD-10-CM

## 2017-12-30 MED ORDER — LOVASTATIN 40 MG TABLET
ORAL_TABLET | Freq: Every day | ORAL | 3 refills | 0 days | Status: CP
Start: 2017-12-30 — End: 2018-03-27

## 2017-12-30 MED ORDER — HYDROCHLOROTHIAZIDE 25 MG TABLET
ORAL_TABLET | Freq: Every day | ORAL | 3 refills | 0 days | Status: CP
Start: 2017-12-30 — End: 2018-12-25

## 2017-12-30 MED ORDER — ENALAPRIL MALEATE 20 MG TABLET
ORAL_TABLET | Freq: Two times a day (BID) | ORAL | 3 refills | 0.00000 days | Status: CP
Start: 2017-12-30 — End: 2018-12-25

## 2017-12-30 MED ORDER — METFORMIN 1,000 MG TABLET
ORAL_TABLET | Freq: Two times a day (BID) | ORAL | 3 refills | 0 days | Status: CP
Start: 2017-12-30 — End: 2018-12-25

## 2017-12-30 MED ORDER — SILDENAFIL (PULMONARY HYPERTENSION) 20 MG TABLET
ORAL_TABLET | 0 refills | 0 days | Status: CP
Start: 2017-12-30 — End: 2018-07-13

## 2018-01-26 MED ORDER — RANITIDINE 150 MG TABLET
ORAL_TABLET | 0 refills | 0 days | Status: CP
Start: 2018-01-26 — End: 2018-04-24

## 2018-01-26 MED ORDER — NAPROXEN 500 MG TABLET
ORAL_TABLET | 0 refills | 0 days | Status: CP
Start: 2018-01-26 — End: 2018-04-24

## 2018-02-14 ENCOUNTER — Encounter: Admit: 2018-02-14 | Discharge: 2018-02-15 | Payer: MEDICARE

## 2018-02-14 DIAGNOSIS — H04129 Dry eye syndrome of unspecified lacrimal gland: Secondary | ICD-10-CM

## 2018-02-14 DIAGNOSIS — E119 Type 2 diabetes mellitus without complications: Principal | ICD-10-CM

## 2018-02-14 DIAGNOSIS — H43813 Vitreous degeneration, bilateral: Secondary | ICD-10-CM

## 2018-02-14 DIAGNOSIS — H269 Unspecified cataract: Secondary | ICD-10-CM

## 2018-03-20 ENCOUNTER — Encounter: Admit: 2018-03-20 | Discharge: 2018-03-21 | Payer: MEDICARE

## 2018-03-20 DIAGNOSIS — J018 Other acute sinusitis: Principal | ICD-10-CM

## 2018-03-20 MED ORDER — FLUTICASONE PROPIONATE 50 MCG/ACTUATION NASAL SPRAY,SUSPENSION
Freq: Every day | NASAL | 11 refills | 0.00000 days | Status: CP
Start: 2018-03-20 — End: ?

## 2018-03-20 MED ORDER — AZITHROMYCIN 250 MG TABLET
ORAL_TABLET | 0 refills | 0 days | Status: CP
Start: 2018-03-20 — End: 2018-03-25

## 2018-03-24 ENCOUNTER — Encounter
Admit: 2018-03-24 | Discharge: 2018-03-25 | Payer: MEDICARE | Attending: Student in an Organized Health Care Education/Training Program | Primary: Student in an Organized Health Care Education/Training Program

## 2018-03-24 DIAGNOSIS — H2511 Age-related nuclear cataract, right eye: Principal | ICD-10-CM

## 2018-03-27 MED ORDER — LOVASTATIN 40 MG TABLET
ORAL_TABLET | Freq: Every day | ORAL | 3 refills | 0.00000 days | Status: CP
Start: 2018-03-27 — End: 2018-04-24

## 2018-04-24 MED ORDER — NAPROXEN 500 MG TABLET
0 refills | 0 days | Status: CP
Start: 2018-04-24 — End: 2018-06-25

## 2018-04-24 MED ORDER — RANITIDINE 150 MG TABLET
0 refills | 0 days | Status: CP
Start: 2018-04-24 — End: 2018-09-08

## 2018-04-24 MED ORDER — LOVASTATIN 40 MG TABLET
0 refills | 0 days | Status: CP
Start: 2018-04-24 — End: 2018-09-19

## 2018-05-24 ENCOUNTER — Encounter: Admit: 2018-05-24 | Discharge: 2018-05-25 | Payer: MEDICARE

## 2018-05-24 DIAGNOSIS — E119 Type 2 diabetes mellitus without complications: Principal | ICD-10-CM

## 2018-05-24 DIAGNOSIS — H2511 Age-related nuclear cataract, right eye: Principal | ICD-10-CM

## 2018-05-24 DIAGNOSIS — I10 Essential (primary) hypertension: Secondary | ICD-10-CM

## 2018-05-24 DIAGNOSIS — R9389 Abnormal findings on diagnostic imaging of other specified body structures: Secondary | ICD-10-CM

## 2018-05-25 ENCOUNTER — Encounter: Admit: 2018-05-25 | Discharge: 2018-05-25 | Payer: MEDICARE | Attending: Anesthesiology | Primary: Anesthesiology

## 2018-05-25 ENCOUNTER — Encounter: Admit: 2018-05-25 | Discharge: 2018-05-25 | Payer: MEDICARE

## 2018-05-25 DIAGNOSIS — H2511 Age-related nuclear cataract, right eye: Principal | ICD-10-CM

## 2018-05-25 MED ORDER — OFLOXACIN 0.3 % EYE DROPS
Freq: Four times a day (QID) | OPHTHALMIC | 0 refills | 0.00000 days | Status: CP
Start: 2018-05-25 — End: 2018-06-23

## 2018-05-25 MED ORDER — PREDNISOLONE ACETATE 1 % EYE DROPS,SUSPENSION
Freq: Four times a day (QID) | OPHTHALMIC | 1 refills | 0 days | Status: CP
Start: 2018-05-25 — End: 2018-06-23

## 2018-05-26 ENCOUNTER — Encounter
Admit: 2018-05-26 | Discharge: 2018-05-27 | Payer: MEDICARE | Attending: Student in an Organized Health Care Education/Training Program | Primary: Student in an Organized Health Care Education/Training Program

## 2018-05-26 DIAGNOSIS — Z9889 Other specified postprocedural states: Secondary | ICD-10-CM

## 2018-05-26 DIAGNOSIS — Z961 Presence of intraocular lens: Principal | ICD-10-CM

## 2018-06-23 ENCOUNTER — Encounter
Admit: 2018-06-23 | Discharge: 2018-06-24 | Payer: MEDICARE | Attending: Student in an Organized Health Care Education/Training Program | Primary: Student in an Organized Health Care Education/Training Program

## 2018-06-23 DIAGNOSIS — Z9889 Other specified postprocedural states: Principal | ICD-10-CM

## 2018-06-25 MED ORDER — NAPROXEN 500 MG TABLET
0 refills | 0 days | Status: CP
Start: 2018-06-25 — End: ?

## 2018-06-27 ENCOUNTER — Encounter: Admit: 2018-06-27 | Discharge: 2018-06-27 | Payer: MEDICARE

## 2018-06-27 ENCOUNTER — Encounter: Admit: 2018-06-27 | Discharge: 2018-06-27 | Payer: MEDICARE | Attending: Pulmonary Disease | Primary: Pulmonary Disease

## 2018-06-27 DIAGNOSIS — R9389 Abnormal findings on diagnostic imaging of other specified body structures: Principal | ICD-10-CM

## 2018-06-27 DIAGNOSIS — E119 Type 2 diabetes mellitus without complications: Secondary | ICD-10-CM

## 2018-07-10 ENCOUNTER — Encounter: Admit: 2018-07-10 | Discharge: 2018-07-11 | Payer: MEDICARE

## 2018-07-10 DIAGNOSIS — R9389 Abnormal findings on diagnostic imaging of other specified body structures: Principal | ICD-10-CM

## 2018-07-10 DIAGNOSIS — J8410 Pulmonary fibrosis, unspecified: Principal | ICD-10-CM

## 2018-07-10 DIAGNOSIS — R590 Localized enlarged lymph nodes: Principal | ICD-10-CM

## 2018-07-10 DIAGNOSIS — I7789 Other specified disorders of arteries and arterioles: Principal | ICD-10-CM

## 2018-07-10 DIAGNOSIS — J439 Emphysema, unspecified: Principal | ICD-10-CM

## 2018-07-13 MED ORDER — SILDENAFIL (PULMONARY HYPERTENSION) 20 MG TABLET
ORAL_TABLET | 0 refills | 0 days | Status: CP
Start: 2018-07-13 — End: ?

## 2018-07-14 ENCOUNTER — Encounter
Admit: 2018-07-14 | Discharge: 2018-07-15 | Payer: MEDICARE | Attending: Student in an Organized Health Care Education/Training Program | Primary: Student in an Organized Health Care Education/Training Program

## 2018-07-14 DIAGNOSIS — M1990 Unspecified osteoarthritis, unspecified site: Principal | ICD-10-CM

## 2018-07-14 DIAGNOSIS — J439 Emphysema, unspecified: Principal | ICD-10-CM

## 2018-07-14 DIAGNOSIS — H04123 Dry eye syndrome of bilateral lacrimal glands: Principal | ICD-10-CM

## 2018-07-14 DIAGNOSIS — Z789 Other specified health status: Principal | ICD-10-CM

## 2018-07-14 DIAGNOSIS — E119 Type 2 diabetes mellitus without complications: Principal | ICD-10-CM

## 2018-07-14 DIAGNOSIS — Z9889 Other specified postprocedural states: Principal | ICD-10-CM

## 2018-07-14 DIAGNOSIS — H269 Unspecified cataract: Principal | ICD-10-CM

## 2018-07-14 DIAGNOSIS — C801 Malignant (primary) neoplasm, unspecified: Principal | ICD-10-CM

## 2018-09-08 ENCOUNTER — Institutional Professional Consult (permissible substitution): Admit: 2018-09-08 | Discharge: 2018-09-09 | Payer: MEDICARE | Attending: Pulmonary Disease | Primary: Pulmonary Disease

## 2018-09-08 ENCOUNTER — Ambulatory Visit: Admit: 2018-09-08 | Discharge: 2018-09-09 | Payer: MEDICARE

## 2018-09-08 DIAGNOSIS — J8410 Pulmonary fibrosis, unspecified: Principal | ICD-10-CM

## 2018-09-08 DIAGNOSIS — J439 Emphysema, unspecified: Secondary | ICD-10-CM

## 2018-09-08 DIAGNOSIS — Z114 Encounter for screening for human immunodeficiency virus [HIV]: Secondary | ICD-10-CM

## 2018-09-08 MED ORDER — TIOTROPIUM BROMIDE 18 MCG CAPSULE WITH INHALATION DEVICE
Freq: Every day | RESPIRATORY_TRACT | 11 refills | 0 days | Status: CP
Start: 2018-09-08 — End: 2019-09-08

## 2018-09-08 MED ORDER — ALBUTEROL SULFATE HFA 90 MCG/ACTUATION AEROSOL INHALER
RESPIRATORY_TRACT | 6 refills | 0 days | Status: CP | PRN
Start: 2018-09-08 — End: ?

## 2018-09-19 MED ORDER — LOVASTATIN 40 MG TABLET
ORAL_TABLET | 0 refills | 0 days | Status: CP
Start: 2018-09-19 — End: 2018-12-25

## 2018-09-22 ENCOUNTER — Encounter: Admit: 2018-09-22 | Discharge: 2018-09-23 | Payer: MEDICARE

## 2018-09-26 ENCOUNTER — Ambulatory Visit: Admit: 2018-09-26 | Discharge: 2018-09-27 | Payer: MEDICARE

## 2018-09-26 DIAGNOSIS — Z114 Encounter for screening for human immunodeficiency virus [HIV]: Secondary | ICD-10-CM

## 2018-09-26 DIAGNOSIS — J439 Emphysema, unspecified: Principal | ICD-10-CM

## 2018-09-26 DIAGNOSIS — J8410 Pulmonary fibrosis, unspecified: Secondary | ICD-10-CM

## 2018-11-24 ENCOUNTER — Ambulatory Visit: Admit: 2018-11-24 | Discharge: 2018-11-25 | Payer: MEDICARE | Attending: Pulmonary Disease | Primary: Pulmonary Disease

## 2018-11-24 ENCOUNTER — Encounter: Admit: 2018-11-24 | Discharge: 2018-11-25 | Payer: MEDICARE

## 2018-11-24 DIAGNOSIS — J8410 Pulmonary fibrosis, unspecified: Principal | ICD-10-CM

## 2018-11-24 DIAGNOSIS — J439 Emphysema, unspecified: Principal | ICD-10-CM

## 2018-11-24 MED ORDER — FLUTICASONE PROPIONATE 230 MCG-SALMETEROL 21 MCG/ACTUATION HFA INHALER
Freq: Two times a day (BID) | RESPIRATORY_TRACT | 11 refills | 0.00000 days | Status: CP
Start: 2018-11-24 — End: 2019-11-24

## 2018-12-12 MED ORDER — OFEV 150 MG CAPSULE
ORAL_CAPSULE | Freq: Two times a day (BID) | ORAL | 11 refills | 0.00000 days | Status: CP
Start: 2018-12-12 — End: 2019-12-12

## 2018-12-25 MED ORDER — LOVASTATIN 40 MG TABLET
ORAL_TABLET | 0 refills | 0 days | Status: CP
Start: 2018-12-25 — End: ?

## 2018-12-25 MED ORDER — METFORMIN 1,000 MG TABLET
ORAL_TABLET | 0 refills | 0 days | Status: CP
Start: 2018-12-25 — End: ?

## 2018-12-25 MED ORDER — ENALAPRIL MALEATE 20 MG TABLET
ORAL_TABLET | 0 refills | 0 days | Status: CP
Start: 2018-12-25 — End: ?

## 2018-12-25 MED ORDER — HYDROCHLOROTHIAZIDE 25 MG TABLET
ORAL_TABLET | 0 refills | 0 days | Status: CP
Start: 2018-12-25 — End: ?

## 2018-12-26 MED ORDER — CLOBETASOL 0.05 % TOPICAL CREAM
Freq: Two times a day (BID) | TOPICAL | 1 refills | 0.00000 days | Status: CP
Start: 2018-12-26 — End: ?

## 2019-01-03 MED ORDER — PREDNISONE 20 MG TABLET
ORAL_TABLET | Freq: Every day | ORAL | 0 refills | 5.00000 days | Status: CP
Start: 2019-01-03 — End: 2019-01-08

## 2019-01-03 MED ORDER — AZITHROMYCIN 250 MG TABLET
ORAL_TABLET | 0 refills | 5 days | Status: CP
Start: 2019-01-03 — End: 2019-01-08

## 2019-01-17 ENCOUNTER — Ambulatory Visit: Admit: 2019-01-17 | Discharge: 2019-01-18 | Payer: MEDICARE

## 2019-01-17 DIAGNOSIS — J8410 Pulmonary fibrosis, unspecified: Secondary | ICD-10-CM

## 2019-01-19 ENCOUNTER — Encounter: Admit: 2019-01-19 | Discharge: 2019-01-20 | Payer: MEDICARE

## 2019-01-19 DIAGNOSIS — Z0000 Encounter for general adult medical examination without abnormal findings: Secondary | ICD-10-CM

## 2019-01-19 DIAGNOSIS — R9389 Abnormal findings on diagnostic imaging of other specified body structures: Secondary | ICD-10-CM

## 2019-01-19 DIAGNOSIS — E119 Type 2 diabetes mellitus without complications: Secondary | ICD-10-CM

## 2019-01-19 DIAGNOSIS — I10 Essential (primary) hypertension: Secondary | ICD-10-CM

## 2019-01-19 DIAGNOSIS — C189 Malignant neoplasm of colon, unspecified: Secondary | ICD-10-CM

## 2019-01-19 MED ORDER — ENALAPRIL MALEATE 20 MG TABLET
ORAL_TABLET | Freq: Two times a day (BID) | ORAL | 3 refills | 90 days | Status: CP
Start: 2019-01-19 — End: 2019-01-29

## 2019-01-19 MED ORDER — METFORMIN 1,000 MG TABLET
ORAL_TABLET | Freq: Two times a day (BID) | ORAL | 3 refills | 90 days | Status: CP
Start: 2019-01-19 — End: ?

## 2019-01-19 MED ORDER — HYDROCHLOROTHIAZIDE 25 MG TABLET
ORAL_TABLET | Freq: Every day | ORAL | 3 refills | 90 days | Status: CP
Start: 2019-01-19 — End: ?

## 2019-01-19 MED ORDER — FAMOTIDINE 20 MG TABLET
ORAL_TABLET | Freq: Every day | ORAL | 3 refills | 90.00000 days | Status: CP
Start: 2019-01-19 — End: ?

## 2019-01-19 MED ORDER — LOVASTATIN 40 MG TABLET
ORAL_TABLET | Freq: Every day | ORAL | 3 refills | 90 days | Status: CP
Start: 2019-01-19 — End: ?

## 2019-01-21 ENCOUNTER — Ambulatory Visit
Admit: 2019-01-21 | Discharge: 2019-01-21 | Disposition: A | Discharge status: Home / Self Care | Payer: MEDICARE | Attending: Emergency Medicine

## 2019-01-21 DIAGNOSIS — Z7982 Long term (current) use of aspirin: Secondary | ICD-10-CM

## 2019-01-21 DIAGNOSIS — E119 Type 2 diabetes mellitus without complications: Secondary | ICD-10-CM

## 2019-01-21 DIAGNOSIS — R221 Localized swelling, mass and lump, neck: Secondary | ICD-10-CM

## 2019-01-21 DIAGNOSIS — Z87891 Personal history of nicotine dependence: Secondary | ICD-10-CM

## 2019-01-21 DIAGNOSIS — J8410 Pulmonary fibrosis, unspecified: Secondary | ICD-10-CM

## 2019-01-21 DIAGNOSIS — T783XXA Angioneurotic edema, initial encounter: Secondary | ICD-10-CM

## 2019-01-21 DIAGNOSIS — J449 Chronic obstructive pulmonary disease, unspecified: Secondary | ICD-10-CM

## 2019-01-21 DIAGNOSIS — K148 Other diseases of tongue: Secondary | ICD-10-CM

## 2019-01-21 MED ORDER — PREDNISONE 20 MG TABLET
ORAL_TABLET | Freq: Every day | ORAL | 0 refills | 4.00000 days | Status: CP
Start: 2019-01-21 — End: 2019-01-29

## 2019-01-24 MED ORDER — GLIPIZIDE 5 MG TABLET
ORAL_TABLET | Freq: Every day | ORAL | 11 refills | 30 days | Status: CP
Start: 2019-01-24 — End: 2020-01-24

## 2019-01-29 ENCOUNTER — Encounter: Admit: 2019-01-29 | Discharge: 2019-01-30 | Payer: MEDICARE

## 2019-01-29 DIAGNOSIS — T464X5A Adverse effect of angiotensin-converting-enzyme inhibitors, initial encounter: Secondary | ICD-10-CM

## 2019-01-29 DIAGNOSIS — K120 Recurrent oral aphthae: Secondary | ICD-10-CM

## 2019-01-29 DIAGNOSIS — I10 Essential (primary) hypertension: Secondary | ICD-10-CM

## 2019-01-29 DIAGNOSIS — T783XXA Angioneurotic edema, initial encounter: Principal | ICD-10-CM

## 2019-01-29 DIAGNOSIS — N401 Enlarged prostate with lower urinary tract symptoms: Secondary | ICD-10-CM

## 2019-01-29 DIAGNOSIS — R3914 Feeling of incomplete bladder emptying: Secondary | ICD-10-CM

## 2019-01-29 MED ORDER — AMLODIPINE 5 MG TABLET
ORAL_TABLET | Freq: Every day | ORAL | 3 refills | 90 days | Status: CP
Start: 2019-01-29 — End: 2020-01-29

## 2019-01-29 MED ORDER — ALFUZOSIN ER 10 MG TABLET,EXTENDED RELEASE 24 HR
ORAL_TABLET | Freq: Every day | ORAL | 3 refills | 90 days | Status: CP
Start: 2019-01-29 — End: 2020-01-29

## 2019-02-16 ENCOUNTER — Encounter: Admit: 2019-02-16 | Discharge: 2019-02-17 | Payer: MEDICARE

## 2019-02-16 ENCOUNTER — Ambulatory Visit: Admit: 2019-02-16 | Discharge: 2019-02-17 | Payer: MEDICARE | Attending: Pulmonary Disease | Primary: Pulmonary Disease

## 2019-02-16 DIAGNOSIS — Z7951 Long term (current) use of inhaled steroids: Secondary | ICD-10-CM

## 2019-02-16 DIAGNOSIS — J439 Emphysema, unspecified: Secondary | ICD-10-CM

## 2019-02-16 DIAGNOSIS — I10 Essential (primary) hypertension: Secondary | ICD-10-CM

## 2019-02-16 DIAGNOSIS — Z87891 Personal history of nicotine dependence: Secondary | ICD-10-CM

## 2019-02-16 DIAGNOSIS — J8410 Pulmonary fibrosis, unspecified: Secondary | ICD-10-CM

## 2019-02-16 DIAGNOSIS — Z9981 Dependence on supplemental oxygen: Secondary | ICD-10-CM

## 2019-02-16 DIAGNOSIS — E119 Type 2 diabetes mellitus without complications: Secondary | ICD-10-CM

## 2019-02-16 DIAGNOSIS — Z85038 Personal history of other malignant neoplasm of large intestine: Secondary | ICD-10-CM

## 2019-02-16 DIAGNOSIS — R05 Cough: Secondary | ICD-10-CM

## 2019-02-16 DIAGNOSIS — Z9221 Personal history of antineoplastic chemotherapy: Secondary | ICD-10-CM

## 2019-02-16 DIAGNOSIS — Z5181 Encounter for therapeutic drug level monitoring: Secondary | ICD-10-CM

## 2019-02-16 DIAGNOSIS — R0683 Snoring: Secondary | ICD-10-CM

## 2019-02-19 ENCOUNTER — Encounter: Admit: 2019-02-19 | Discharge: 2019-02-20 | Payer: MEDICARE

## 2019-02-19 DIAGNOSIS — J849 Interstitial pulmonary disease, unspecified: Principal | ICD-10-CM

## 2019-02-19 DIAGNOSIS — R0602 Shortness of breath: Principal | ICD-10-CM

## 2019-03-07 ENCOUNTER — Encounter: Admit: 2019-03-07 | Discharge: 2019-03-08 | Payer: MEDICARE

## 2019-03-07 DIAGNOSIS — R05 Cough: Principal | ICD-10-CM

## 2019-03-09 ENCOUNTER — Encounter: Admit: 2019-03-09 | Discharge: 2019-03-10 | Payer: MEDICARE

## 2019-03-09 DIAGNOSIS — J189 Pneumonia, unspecified organism: Principal | ICD-10-CM

## 2019-03-09 MED ORDER — AMOXICILLIN 875 MG-POTASSIUM CLAVULANATE 125 MG TABLET
ORAL_TABLET | Freq: Two times a day (BID) | ORAL | 0 refills | 28 days | Status: CP
Start: 2019-03-09 — End: 2019-04-06

## 2019-03-16 ENCOUNTER — Ambulatory Visit: Admit: 2019-03-16 | Discharge: 2019-03-17 | Payer: MEDICARE

## 2019-03-16 DIAGNOSIS — E119 Type 2 diabetes mellitus without complications: Principal | ICD-10-CM

## 2019-03-27 MED ORDER — NAPROXEN 500 MG TABLET
ORAL_TABLET | 0 refills | 0 days | Status: CP
Start: 2019-03-27 — End: ?

## 2019-04-04 DIAGNOSIS — J189 Pneumonia, unspecified organism: Principal | ICD-10-CM

## 2019-04-04 MED ORDER — AMOXICILLIN 875 MG-POTASSIUM CLAVULANATE 125 MG TABLET
ORAL_TABLET | Freq: Two times a day (BID) | ORAL | 0 refills | 14.00000 days | Status: CP
Start: 2019-04-04 — End: 2019-04-18

## 2019-04-26 DIAGNOSIS — J189 Pneumonia, unspecified organism: Principal | ICD-10-CM

## 2019-05-09 ENCOUNTER — Encounter: Admit: 2019-05-09 | Discharge: 2019-05-10 | Payer: MEDICARE

## 2019-05-14 DIAGNOSIS — J189 Pneumonia, unspecified organism: Principal | ICD-10-CM

## 2019-05-14 MED ORDER — AMOXICILLIN 875 MG-POTASSIUM CLAVULANATE 125 MG TABLET
ORAL_TABLET | Freq: Two times a day (BID) | ORAL | 0 refills | 28.00000 days | Status: CP
Start: 2019-05-14 — End: 2019-06-11

## 2019-06-08 DIAGNOSIS — J189 Pneumonia, unspecified organism: Principal | ICD-10-CM

## 2019-06-11 ENCOUNTER — Encounter: Admit: 2019-06-11 | Discharge: 2019-06-12 | Payer: MEDICARE

## 2019-06-12 ENCOUNTER — Other Ambulatory Visit: Admit: 2019-06-12 | Discharge: 2019-06-13 | Payer: MEDICARE

## 2019-06-18 DIAGNOSIS — J449 Chronic obstructive pulmonary disease, unspecified: Principal | ICD-10-CM

## 2019-06-18 DIAGNOSIS — J439 Emphysema, unspecified: Principal | ICD-10-CM

## 2019-06-18 MED ORDER — TIOTROPIUM BROMIDE 18 MCG CAPSULE WITH INHALATION DEVICE
ORAL_CAPSULE | Freq: Every day | RESPIRATORY_TRACT | 11 refills | 30.00000 days | Status: CP
Start: 2019-06-18 — End: 2020-06-17

## 2019-06-22 ENCOUNTER — Encounter: Admit: 2019-06-22 | Discharge: 2019-06-23 | Payer: MEDICARE | Attending: Pulmonary Disease | Primary: Pulmonary Disease

## 2019-06-22 ENCOUNTER — Ambulatory Visit: Admit: 2019-06-22 | Discharge: 2019-06-23 | Payer: MEDICARE

## 2019-06-22 DIAGNOSIS — Z5181 Encounter for therapeutic drug level monitoring: Principal | ICD-10-CM

## 2019-06-22 DIAGNOSIS — J8410 Pulmonary fibrosis, unspecified: Principal | ICD-10-CM

## 2019-06-22 DIAGNOSIS — J449 Chronic obstructive pulmonary disease, unspecified: Principal | ICD-10-CM

## 2019-06-22 DIAGNOSIS — R0602 Shortness of breath: Principal | ICD-10-CM

## 2019-06-22 DIAGNOSIS — K219 Gastro-esophageal reflux disease without esophagitis: Principal | ICD-10-CM

## 2019-06-22 MED ORDER — PANTOPRAZOLE 40 MG TABLET,DELAYED RELEASE
ORAL_TABLET | Freq: Every day | ORAL | 11 refills | 30 days | Status: CP
Start: 2019-06-22 — End: 2020-06-21

## 2019-06-26 ENCOUNTER — Encounter: Admit: 2019-06-26 | Discharge: 2019-06-27 | Payer: MEDICARE

## 2019-06-26 DIAGNOSIS — H43392 Other vitreous opacities, left eye: Principal | ICD-10-CM

## 2019-06-26 DIAGNOSIS — Z961 Presence of intraocular lens: Principal | ICD-10-CM

## 2019-06-26 DIAGNOSIS — H43813 Vitreous degeneration, bilateral: Principal | ICD-10-CM

## 2019-06-26 DIAGNOSIS — E119 Type 2 diabetes mellitus without complications: Principal | ICD-10-CM

## 2019-06-26 DIAGNOSIS — H2511 Age-related nuclear cataract, right eye: Principal | ICD-10-CM

## 2019-06-27 DIAGNOSIS — R0602 Shortness of breath: Principal | ICD-10-CM

## 2019-06-27 DIAGNOSIS — J849 Interstitial pulmonary disease, unspecified: Principal | ICD-10-CM

## 2019-07-05 ENCOUNTER — Encounter: Admit: 2019-07-05 | Discharge: 2019-07-06 | Payer: MEDICARE

## 2019-07-06 ENCOUNTER — Encounter: Admit: 2019-07-06 | Discharge: 2019-07-07 | Payer: MEDICARE

## 2019-07-09 ENCOUNTER — Encounter: Admit: 2019-07-09 | Discharge: 2019-07-10 | Payer: MEDICARE

## 2019-07-23 ENCOUNTER — Encounter: Admit: 2019-07-23 | Discharge: 2019-07-24 | Payer: MEDICARE

## 2019-07-23 DIAGNOSIS — E119 Type 2 diabetes mellitus without complications: Principal | ICD-10-CM

## 2019-07-23 DIAGNOSIS — R9389 Abnormal findings on diagnostic imaging of other specified body structures: Principal | ICD-10-CM

## 2019-07-23 DIAGNOSIS — C189 Malignant neoplasm of colon, unspecified: Principal | ICD-10-CM

## 2019-07-23 DIAGNOSIS — Z0000 Encounter for general adult medical examination without abnormal findings: Principal | ICD-10-CM

## 2019-08-14 MED ORDER — SILDENAFIL (PULMONARY HYPERTENSION) 20 MG TABLET
ORAL_TABLET | Freq: Every day | ORAL | 11 refills | 30 days | Status: CP | PRN
Start: 2019-08-14 — End: ?

## 2019-08-15 MED ORDER — SILDENAFIL (PULMONARY HYPERTENSION) 20 MG TABLET
ORAL_TABLET | Freq: Every day | ORAL | 11 refills | 30 days | Status: CP | PRN
Start: 2019-08-15 — End: ?

## 2019-09-07 ENCOUNTER — Ambulatory Visit: Admit: 2019-09-07 | Discharge: 2019-09-08 | Payer: MEDICARE

## 2019-09-07 DIAGNOSIS — C189 Malignant neoplasm of colon, unspecified: Principal | ICD-10-CM

## 2019-09-19 DIAGNOSIS — R05 Cough: Principal | ICD-10-CM

## 2019-09-19 MED ORDER — AMOXICILLIN 875 MG-POTASSIUM CLAVULANATE 125 MG TABLET
ORAL_TABLET | Freq: Two times a day (BID) | ORAL | 0 refills | 14.00000 days | Status: CP
Start: 2019-09-19 — End: 2019-10-03

## 2019-09-26 DIAGNOSIS — J849 Interstitial pulmonary disease, unspecified: Principal | ICD-10-CM

## 2019-09-26 DIAGNOSIS — R0602 Shortness of breath: Principal | ICD-10-CM

## 2019-09-28 ENCOUNTER — Ambulatory Visit: Admit: 2019-09-28 | Discharge: 2019-09-28 | Payer: MEDICARE

## 2019-09-28 ENCOUNTER — Encounter: Admit: 2019-09-28 | Discharge: 2019-09-28 | Payer: MEDICARE

## 2019-09-28 ENCOUNTER — Ambulatory Visit: Admit: 2019-09-28 | Discharge: 2019-09-28 | Payer: MEDICARE | Attending: Pulmonary Disease | Primary: Pulmonary Disease

## 2019-09-28 DIAGNOSIS — J449 Chronic obstructive pulmonary disease, unspecified: Principal | ICD-10-CM

## 2019-09-28 DIAGNOSIS — J8410 Pulmonary fibrosis, unspecified: Principal | ICD-10-CM

## 2019-09-28 MED ORDER — TRELEGY ELLIPTA 100 MCG-62.5 MCG-25 MCG POWDER FOR INHALATION
Freq: Every day | RESPIRATORY_TRACT | 11 refills | 30 days | Status: CP
Start: 2019-09-28 — End: ?

## 2019-10-09 DIAGNOSIS — Z01818 Encounter for other preprocedural examination: Principal | ICD-10-CM

## 2019-11-08 ENCOUNTER — Encounter: Admit: 2019-11-08 | Discharge: 2019-11-09 | Payer: MEDICARE

## 2019-11-08 DIAGNOSIS — C189 Malignant neoplasm of colon, unspecified: Principal | ICD-10-CM

## 2019-11-08 DIAGNOSIS — J84112 Idiopathic pulmonary fibrosis: Principal | ICD-10-CM

## 2019-11-08 DIAGNOSIS — E119 Type 2 diabetes mellitus without complications: Principal | ICD-10-CM

## 2019-11-08 DIAGNOSIS — I272 Pulmonary hypertension, unspecified: Principal | ICD-10-CM

## 2019-11-08 MED ORDER — SILDENAFIL (PULMONARY HYPERTENSION) 20 MG TABLET
ORAL_TABLET | Freq: Every day | ORAL | 11 refills | 30 days | Status: CP | PRN
Start: 2019-11-08 — End: ?

## 2019-11-08 MED ORDER — NAPROXEN 500 MG TABLET
ORAL_TABLET | 0 refills | 0 days | Status: CP
Start: 2019-11-08 — End: ?

## 2019-11-30 ENCOUNTER — Telehealth: Admit: 2019-11-30 | Discharge: 2019-12-01 | Attending: Ambulatory Care | Primary: Ambulatory Care

## 2019-12-10 DIAGNOSIS — J84112 Idiopathic pulmonary fibrosis: Principal | ICD-10-CM

## 2019-12-11 DIAGNOSIS — J84112 Idiopathic pulmonary fibrosis: Principal | ICD-10-CM

## 2019-12-11 MED ORDER — OFEV 150 MG CAPSULE
ORAL_CAPSULE | Freq: Two times a day (BID) | ORAL | 11 refills | 0.00000 days | Status: CP
Start: 2019-12-11 — End: 2020-12-10

## 2019-12-28 ENCOUNTER — Encounter: Admit: 2019-12-28 | Discharge: 2019-12-28 | Payer: MEDICARE | Attending: Pulmonary Disease | Primary: Pulmonary Disease

## 2019-12-28 ENCOUNTER — Encounter: Admit: 2019-12-28 | Discharge: 2019-12-28 | Payer: MEDICARE

## 2019-12-28 DIAGNOSIS — J439 Emphysema, unspecified: Principal | ICD-10-CM

## 2019-12-28 MED ORDER — ALBUTEROL SULFATE HFA 90 MCG/ACTUATION AEROSOL INHALER
RESPIRATORY_TRACT | 11 refills | 0 days | Status: CP | PRN
Start: 2019-12-28 — End: ?

## 2020-02-05 MED ORDER — ALFUZOSIN ER 10 MG TABLET,EXTENDED RELEASE 24 HR
ORAL_TABLET | 0 refills | 0 days | Status: CP
Start: 2020-02-05 — End: ?

## 2020-02-05 MED ORDER — METFORMIN 1,000 MG TABLET
ORAL_TABLET | 0 refills | 0 days | Status: CP
Start: 2020-02-05 — End: ?

## 2020-02-05 MED ORDER — AMLODIPINE 5 MG TABLET
ORAL_TABLET | 0 refills | 0 days | Status: CP
Start: 2020-02-05 — End: ?

## 2020-02-05 MED ORDER — HYDROCHLOROTHIAZIDE 25 MG TABLET
ORAL_TABLET | 0 refills | 0 days | Status: CP
Start: 2020-02-05 — End: ?

## 2020-02-08 ENCOUNTER — Ambulatory Visit: Admit: 2020-02-08 | Discharge: 2020-02-09 | Payer: MEDICARE

## 2020-02-08 DIAGNOSIS — R9389 Abnormal findings on diagnostic imaging of other specified body structures: Principal | ICD-10-CM

## 2020-02-08 DIAGNOSIS — I10 Essential (primary) hypertension: Principal | ICD-10-CM

## 2020-02-08 DIAGNOSIS — Z0000 Encounter for general adult medical examination without abnormal findings: Principal | ICD-10-CM

## 2020-02-08 DIAGNOSIS — E119 Type 2 diabetes mellitus without complications: Principal | ICD-10-CM

## 2020-02-08 MED ORDER — NAPROXEN 500 MG TABLET
ORAL_TABLET | 0 refills | 0 days | Status: CP
Start: 2020-02-08 — End: ?

## 2020-02-08 MED ORDER — AMLODIPINE 10 MG TABLET
ORAL_TABLET | Freq: Every day | ORAL | 3 refills | 180 days | Status: CP
Start: 2020-02-08 — End: ?

## 2020-02-11 MED ORDER — GLIPIZIDE 5 MG TABLET
ORAL_TABLET | 1 refills | 0 days | Status: CP
Start: 2020-02-11 — End: ?

## 2020-02-12 ENCOUNTER — Encounter: Admit: 2020-02-12 | Discharge: 2020-02-12 | Payer: MEDICARE

## 2020-02-12 ENCOUNTER — Ambulatory Visit: Admit: 2020-02-12 | Discharge: 2020-02-12 | Payer: MEDICARE | Attending: Internal Medicine | Primary: Internal Medicine

## 2020-02-12 DIAGNOSIS — J439 Emphysema, unspecified: Principal | ICD-10-CM

## 2020-02-12 DIAGNOSIS — J84112 Idiopathic pulmonary fibrosis: Principal | ICD-10-CM

## 2020-02-12 DIAGNOSIS — Z01818 Encounter for other preprocedural examination: Principal | ICD-10-CM

## 2020-02-13 ENCOUNTER — Institutional Professional Consult (permissible substitution): Admit: 2020-02-13 | Discharge: 2020-02-14 | Payer: MEDICARE

## 2020-02-13 ENCOUNTER — Encounter: Admit: 2020-02-13 | Discharge: 2020-02-14 | Payer: MEDICARE

## 2020-02-13 ENCOUNTER — Encounter: Admit: 2020-02-13 | Discharge: 2020-02-14 | Payer: MEDICARE | Attending: Registered" | Primary: Registered"

## 2020-02-13 ENCOUNTER — Telehealth: Admit: 2020-02-13 | Discharge: 2020-02-14 | Payer: MEDICARE | Attending: Clinical | Primary: Clinical

## 2020-02-15 ENCOUNTER — Ambulatory Visit: Admit: 2020-02-15 | Discharge: 2020-02-17 | Payer: MEDICARE

## 2020-02-15 ENCOUNTER — Encounter: Admit: 2020-02-15 | Discharge: 2020-02-28 | Payer: MEDICARE

## 2020-02-21 DIAGNOSIS — Z01818 Encounter for other preprocedural examination: Principal | ICD-10-CM

## 2020-02-26 DIAGNOSIS — Z01818 Encounter for other preprocedural examination: Principal | ICD-10-CM

## 2020-02-27 DIAGNOSIS — Z01818 Encounter for other preprocedural examination: Principal | ICD-10-CM

## 2020-03-03 ENCOUNTER — Ambulatory Visit: Admit: 2020-03-03 | Discharge: 2020-03-03 | Payer: MEDICARE

## 2020-03-03 ENCOUNTER — Encounter: Admit: 2020-03-03 | Discharge: 2020-03-03 | Payer: MEDICARE

## 2020-03-03 MED ORDER — AMLODIPINE 5 MG TABLET
0 days
Start: 2020-03-03 — End: 2020-05-28

## 2020-03-04 ENCOUNTER — Ambulatory Visit: Admit: 2020-03-04 | Discharge: 2020-03-04 | Payer: MEDICARE

## 2020-03-05 ENCOUNTER — Ambulatory Visit: Admit: 2020-03-05 | Discharge: 2020-03-06 | Payer: MEDICARE

## 2020-03-05 ENCOUNTER — Encounter: Admit: 2020-03-05 | Discharge: 2020-03-06 | Payer: MEDICARE

## 2020-03-05 ENCOUNTER — Encounter: Admit: 2020-03-05 | Discharge: 2020-03-06 | Payer: MEDICARE | Attending: Clinical | Primary: Clinical

## 2020-03-07 ENCOUNTER — Encounter: Admit: 2020-03-07 | Discharge: 2020-03-08 | Payer: MEDICARE

## 2020-03-07 ENCOUNTER — Ambulatory Visit: Admit: 2020-03-07 | Discharge: 2020-03-08 | Payer: MEDICARE | Attending: Surgery | Primary: Surgery

## 2020-03-07 DIAGNOSIS — Z01818 Encounter for other preprocedural examination: Principal | ICD-10-CM

## 2020-03-10 ENCOUNTER — Encounter
Admit: 2020-03-10 | Discharge: 2020-03-11 | Payer: MEDICARE | Attending: Infectious Disease | Primary: Infectious Disease

## 2020-03-10 ENCOUNTER — Encounter
Admit: 2020-03-10 | Discharge: 2020-04-08 | Payer: MEDICARE | Attending: Rehabilitative and Restorative Service Providers" | Primary: Rehabilitative and Restorative Service Providers"

## 2020-03-10 ENCOUNTER — Encounter: Admit: 2020-03-10 | Discharge: 2020-03-11 | Payer: MEDICARE

## 2020-03-10 ENCOUNTER — Ambulatory Visit: Admit: 2020-03-10 | Discharge: 2020-03-11 | Payer: MEDICARE

## 2020-03-10 DIAGNOSIS — E119 Type 2 diabetes mellitus without complications: Principal | ICD-10-CM

## 2020-03-10 DIAGNOSIS — J8410 Pulmonary fibrosis, unspecified: Principal | ICD-10-CM

## 2020-03-10 DIAGNOSIS — R0602 Shortness of breath: Principal | ICD-10-CM

## 2020-03-10 DIAGNOSIS — Z01818 Encounter for other preprocedural examination: Principal | ICD-10-CM

## 2020-03-10 DIAGNOSIS — M1990 Unspecified osteoarthritis, unspecified site: Principal | ICD-10-CM

## 2020-03-10 DIAGNOSIS — Z7409 Other reduced mobility: Principal | ICD-10-CM

## 2020-03-10 DIAGNOSIS — Z85038 Personal history of other malignant neoplasm of large intestine: Principal | ICD-10-CM

## 2020-03-10 DIAGNOSIS — Z87891 Personal history of nicotine dependence: Principal | ICD-10-CM

## 2020-03-10 DIAGNOSIS — Z23 Encounter for immunization: Principal | ICD-10-CM

## 2020-03-10 DIAGNOSIS — J439 Emphysema, unspecified: Principal | ICD-10-CM

## 2020-03-11 ENCOUNTER — Encounter: Admit: 2020-03-11 | Discharge: 2020-03-12 | Payer: MEDICARE

## 2020-03-11 ENCOUNTER — Institutional Professional Consult (permissible substitution): Admit: 2020-03-11 | Discharge: 2020-03-12 | Payer: MEDICARE

## 2020-03-11 DIAGNOSIS — Z7982 Long term (current) use of aspirin: Principal | ICD-10-CM

## 2020-03-11 DIAGNOSIS — Z7984 Long term (current) use of oral hypoglycemic drugs: Principal | ICD-10-CM

## 2020-03-11 DIAGNOSIS — Z79899 Other long term (current) drug therapy: Principal | ICD-10-CM

## 2020-03-11 DIAGNOSIS — Z01818 Encounter for other preprocedural examination: Principal | ICD-10-CM

## 2020-03-12 ENCOUNTER — Ambulatory Visit: Admit: 2020-03-12 | Discharge: 2020-03-12 | Payer: MEDICARE

## 2020-03-12 ENCOUNTER — Encounter: Admit: 2020-03-12 | Discharge: 2020-03-12 | Payer: MEDICARE

## 2020-03-12 DIAGNOSIS — Z87891 Personal history of nicotine dependence: Principal | ICD-10-CM

## 2020-03-12 DIAGNOSIS — G8929 Other chronic pain: Principal | ICD-10-CM

## 2020-03-12 DIAGNOSIS — E119 Type 2 diabetes mellitus without complications: Principal | ICD-10-CM

## 2020-03-12 DIAGNOSIS — M25511 Pain in right shoulder: Principal | ICD-10-CM

## 2020-03-12 DIAGNOSIS — I7102 Dissection of abdominal aorta: Principal | ICD-10-CM

## 2020-03-12 DIAGNOSIS — I10 Essential (primary) hypertension: Principal | ICD-10-CM

## 2020-03-12 DIAGNOSIS — J84112 Idiopathic pulmonary fibrosis: Principal | ICD-10-CM

## 2020-03-12 DIAGNOSIS — J439 Emphysema, unspecified: Principal | ICD-10-CM

## 2020-03-12 DIAGNOSIS — Z23 Encounter for immunization: Principal | ICD-10-CM

## 2020-03-12 DIAGNOSIS — R0902 Hypoxemia: Principal | ICD-10-CM

## 2020-03-12 DIAGNOSIS — M25512 Pain in left shoulder: Principal | ICD-10-CM

## 2020-03-12 DIAGNOSIS — Z9981 Dependence on supplemental oxygen: Principal | ICD-10-CM

## 2020-03-12 DIAGNOSIS — Z85038 Personal history of other malignant neoplasm of large intestine: Principal | ICD-10-CM

## 2020-03-12 DIAGNOSIS — Z8505 Personal history of malignant neoplasm of liver: Principal | ICD-10-CM

## 2020-03-19 ENCOUNTER — Institutional Professional Consult (permissible substitution): Admit: 2020-03-19 | Discharge: 2020-03-20 | Payer: MEDICARE

## 2020-03-19 ENCOUNTER — Encounter: Admit: 2020-03-19 | Discharge: 2020-03-20 | Payer: MEDICARE | Attending: Registered" | Primary: Registered"

## 2020-03-19 DIAGNOSIS — Z01818 Encounter for other preprocedural examination: Principal | ICD-10-CM

## 2020-03-19 DIAGNOSIS — E119 Type 2 diabetes mellitus without complications: Principal | ICD-10-CM

## 2020-03-25 DIAGNOSIS — I700 Atherosclerosis of aorta: Principal | ICD-10-CM

## 2020-03-25 DIAGNOSIS — I7102 Dissection of abdominal aorta: Principal | ICD-10-CM

## 2020-03-25 DIAGNOSIS — Z789 Other specified health status: Principal | ICD-10-CM

## 2020-03-28 ENCOUNTER — Encounter: Admit: 2020-03-28 | Discharge: 2020-03-29 | Payer: MEDICARE | Attending: Clinical | Primary: Clinical

## 2020-03-28 DIAGNOSIS — Z7289 Other problems related to lifestyle: Principal | ICD-10-CM

## 2020-03-28 DIAGNOSIS — Z789 Other specified health status: Principal | ICD-10-CM

## 2020-03-28 DIAGNOSIS — I10 Essential (primary) hypertension: Principal | ICD-10-CM

## 2020-03-28 DIAGNOSIS — Z1339 Encounter for screening examination for other mental health and behavioral disorders: Principal | ICD-10-CM

## 2020-03-28 DIAGNOSIS — E119 Type 2 diabetes mellitus without complications: Principal | ICD-10-CM

## 2020-04-06 DIAGNOSIS — I714 Abdominal aortic aneurysm, without rupture: Principal | ICD-10-CM

## 2020-04-06 DIAGNOSIS — I7102 Dissection of abdominal aorta: Principal | ICD-10-CM

## 2020-04-07 ENCOUNTER — Encounter: Admit: 2020-04-07 | Discharge: 2020-04-08 | Payer: MEDICARE

## 2020-04-07 DIAGNOSIS — I714 Abdominal aortic aneurysm, without rupture: Principal | ICD-10-CM

## 2020-04-07 DIAGNOSIS — I7102 Dissection of abdominal aorta: Principal | ICD-10-CM

## 2020-04-07 MED ORDER — LOVASTATIN 40 MG TABLET
ORAL_TABLET | 0 refills | 0 days | Status: CP
Start: 2020-04-07 — End: 2020-05-28

## 2020-04-11 ENCOUNTER — Encounter: Admit: 2020-04-11 | Discharge: 2020-04-11 | Payer: PRIVATE HEALTH INSURANCE

## 2020-04-11 ENCOUNTER — Encounter: Admit: 2020-04-11 | Discharge: 2020-04-11 | Payer: MEDICARE | Attending: Pulmonary Disease | Primary: Pulmonary Disease

## 2020-04-11 DIAGNOSIS — J439 Emphysema, unspecified: Principal | ICD-10-CM

## 2020-04-11 DIAGNOSIS — Z7682 Awaiting organ transplant status: Principal | ICD-10-CM

## 2020-04-11 DIAGNOSIS — J8410 Pulmonary fibrosis, unspecified: Principal | ICD-10-CM

## 2020-04-11 DIAGNOSIS — I272 Pulmonary hypertension, unspecified: Principal | ICD-10-CM

## 2020-04-30 MED ORDER — ALFUZOSIN ER 10 MG TABLET,EXTENDED RELEASE 24 HR
ORAL_TABLET | ORAL | 3 refills | 0.00000 days | Status: CP
Start: 2020-04-30 — End: 2020-05-28

## 2020-04-30 MED ORDER — HYDROCHLOROTHIAZIDE 25 MG TABLET
ORAL_TABLET | 3 refills | 0.00000 days | Status: CP
Start: 2020-04-30 — End: 2020-05-28

## 2020-05-01 ENCOUNTER — Encounter: Admit: 2020-05-01 | Discharge: 2020-05-02 | Payer: MEDICARE | Attending: Clinical | Primary: Clinical

## 2020-05-01 DIAGNOSIS — F489 Nonpsychotic mental disorder, unspecified: Principal | ICD-10-CM

## 2020-05-01 DIAGNOSIS — Z1339 Encounter for screening examination for other mental health and behavioral disorders: Principal | ICD-10-CM

## 2020-05-13 MED ORDER — METFORMIN 1,000 MG TABLET
ORAL_TABLET | 0 refills | 0.00000 days | Status: SS
Start: 2020-05-13 — End: 2020-05-27

## 2020-05-14 ENCOUNTER — Encounter: Admit: 2020-05-14 | Discharge: 2020-05-14 | Payer: MEDICARE

## 2020-05-14 ENCOUNTER — Telehealth: Admit: 2020-05-14 | Discharge: 2020-05-15 | Payer: MEDICARE | Attending: Clinical | Primary: Clinical

## 2020-05-14 DIAGNOSIS — Z7289 Other problems related to lifestyle: Principal | ICD-10-CM

## 2020-05-15 ENCOUNTER — Encounter: Admit: 2020-05-15 | Discharge: 2020-05-28 | Disposition: A | Discharge status: Home / Self Care | Payer: MEDICARE

## 2020-05-15 ENCOUNTER — Ambulatory Visit: Admit: 2020-05-15 | Discharge: 2020-05-28 | Disposition: A | Discharge status: Home / Self Care | Payer: MEDICARE

## 2020-05-15 ENCOUNTER — Ambulatory Visit
Admit: 2020-05-15 | Discharge: 2020-05-28 | Disposition: A | Discharge status: Home / Self Care | Payer: PRIVATE HEALTH INSURANCE

## 2020-05-15 DIAGNOSIS — D509 Iron deficiency anemia, unspecified: Principal | ICD-10-CM

## 2020-05-15 DIAGNOSIS — T86812 Lung transplant infection: Principal | ICD-10-CM

## 2020-05-15 DIAGNOSIS — G4733 Obstructive sleep apnea (adult) (pediatric): Principal | ICD-10-CM

## 2020-05-15 DIAGNOSIS — Z7984 Long term (current) use of oral hypoglycemic drugs: Principal | ICD-10-CM

## 2020-05-15 DIAGNOSIS — M1990 Unspecified osteoarthritis, unspecified site: Principal | ICD-10-CM

## 2020-05-15 DIAGNOSIS — T8119XA Other postprocedural shock, initial encounter: Principal | ICD-10-CM

## 2020-05-15 DIAGNOSIS — Z7982 Long term (current) use of aspirin: Principal | ICD-10-CM

## 2020-05-15 DIAGNOSIS — Z8505 Personal history of malignant neoplasm of liver: Principal | ICD-10-CM

## 2020-05-15 DIAGNOSIS — J9612 Chronic respiratory failure with hypercapnia: Principal | ICD-10-CM

## 2020-05-15 DIAGNOSIS — Z85038 Personal history of other malignant neoplasm of large intestine: Principal | ICD-10-CM

## 2020-05-15 DIAGNOSIS — G8918 Other acute postprocedural pain: Principal | ICD-10-CM

## 2020-05-15 DIAGNOSIS — Z9221 Personal history of antineoplastic chemotherapy: Principal | ICD-10-CM

## 2020-05-15 DIAGNOSIS — D72828 Other elevated white blood cell count: Principal | ICD-10-CM

## 2020-05-15 DIAGNOSIS — E1165 Type 2 diabetes mellitus with hyperglycemia: Principal | ICD-10-CM

## 2020-05-15 DIAGNOSIS — Z9981 Dependence on supplemental oxygen: Principal | ICD-10-CM

## 2020-05-15 DIAGNOSIS — J9611 Chronic respiratory failure with hypoxia: Principal | ICD-10-CM

## 2020-05-15 DIAGNOSIS — D62 Acute posthemorrhagic anemia: Principal | ICD-10-CM

## 2020-05-15 DIAGNOSIS — J439 Emphysema, unspecified: Principal | ICD-10-CM

## 2020-05-15 DIAGNOSIS — B449 Aspergillosis, unspecified: Principal | ICD-10-CM

## 2020-05-15 DIAGNOSIS — K219 Gastro-esophageal reflux disease without esophagitis: Principal | ICD-10-CM

## 2020-05-15 DIAGNOSIS — J84112 Idiopathic pulmonary fibrosis: Principal | ICD-10-CM

## 2020-05-15 DIAGNOSIS — Z87891 Personal history of nicotine dependence: Principal | ICD-10-CM

## 2020-05-15 DIAGNOSIS — I10 Essential (primary) hypertension: Principal | ICD-10-CM

## 2020-05-15 DIAGNOSIS — E11649 Type 2 diabetes mellitus with hypoglycemia without coma: Principal | ICD-10-CM

## 2020-05-15 DIAGNOSIS — I272 Pulmonary hypertension, unspecified: Principal | ICD-10-CM

## 2020-05-15 DIAGNOSIS — Z20822 Contact with and (suspected) exposure to covid-19: Principal | ICD-10-CM

## 2020-05-15 DIAGNOSIS — E782 Mixed hyperlipidemia: Principal | ICD-10-CM

## 2020-05-15 DIAGNOSIS — T8182XA Emphysema (subcutaneous) resulting from a procedure, initial encounter: Principal | ICD-10-CM

## 2020-05-15 DIAGNOSIS — B371 Pulmonary candidiasis: Principal | ICD-10-CM

## 2020-05-15 DIAGNOSIS — J42 Unspecified chronic bronchitis: Principal | ICD-10-CM

## 2020-05-15 DIAGNOSIS — J849 Interstitial pulmonary disease, unspecified: Principal | ICD-10-CM

## 2020-05-15 DIAGNOSIS — Z9049 Acquired absence of other specified parts of digestive tract: Principal | ICD-10-CM

## 2020-05-15 DIAGNOSIS — Z942 Lung transplant status: Principal | ICD-10-CM

## 2020-05-15 DIAGNOSIS — Z298 Encounter for other specified prophylactic measures: Principal | ICD-10-CM

## 2020-05-15 DIAGNOSIS — B259 Cytomegaloviral disease, unspecified: Principal | ICD-10-CM

## 2020-05-15 NOTE — Unmapped (Addendum)
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    Procedure: Flexible Bronchoscopy    Double Lung transplant with Cardiopulmonary bypass    Donor backtable lung dissection  Placement of left subclavian central line       Surgeon:  Allayne Gitelman, MD    Surgical Assistant: Cathlean Sauer PA-C and Gunnar Fusi, PA-C no qualified resident was available.    Anesthesia:  General    Indications:  This is a 70 year old male who present with end stage lung disease secondary to ILD and COPD.  The patient has a history of diabetes, hypertension, colon cancer status post resection chemotherapy and right hepatectomy for metastasis.  The patient present for transplant.  The patient was made aware of the risk and the benefits of the procedure and were willing to proceed.    Operative findings:  Donor cross-clamp time was 14:12.  left lung out of ice time was 17:43.  left lung clamp off time was 18:33.  left lung cold ischemia time was 211 minutes.  left warm ischemia time was 50 minutes.   right lung out of ice time was 18:56.  right lung clamp off time was 19:42.  right lung cold ischemia time was 284 minutes.  right warm ischemia time was 46 minutes.  VA ECMO time was 192 minutes.    Gunnar Fusi assisted with placement of the left subclavian central line.    Tatjuana Misunina assisted with positioning and prepping and draping and placement of right femoral arterial line and incision he had dissection of native lungs and donor back table preparation of donor lungs and preparation for VA ECMO and implantation of donor lungs and closing.        Procedure:  The patient was brought into the operating room.  The patient was placed in the supine position.  After the appropriate timeout was performed and after the appropriate ABO and donor ID was established the patient was given general anesthesia and was then intubated with a double lumen endotracheal tube.  After the appropriate monitoring devices were placed and after the appropriate placement of the double-lumen endotracheal tube, the patient was kept in the supine position with the arms outstretched.   Bilateral anterior thoracotomy incisions were performed. The chest space was entered at the fourth intercostal space.  The right was isolated first.  Adhesions were taken down with a combination of sharp and blunt dissection.  The inferior pulmonary ligament was taken down to the level of the inferior pulmonary vein.  The hilum was then dissected.  The superior and inferior pulmonary veins were identified and encircled.  The pulmonary artery and its branches were identified and encircled at the hilum.   The left was then  isolated.  Adhesions were taken down with a combination of sharp and blunt dissection.  The inferior pulmonary ligament was taken down to the level of the inferior pulmonary vein.  The hilum was then dissected.  The superior and inferior pulmonary veins were identified and encircled.  The pulmonary artery and its branches were identified and encircled at the hilum.     The patient was then placed on VA ECMO.  In preparation the patient was given 5000 units of IV heparin.  The pericardium was opened.  The ascending aorta and the right atrium were identified.  Cannulation sutures were placed around the ascending aorta and  the right atrium.  After 3 minutes cannulas were then placed within the ascending aorta and right atrium and fashioned appropriately.  Cannulas were then connected to the [facility] ECMO circuit without entraining air.  The patient was then started on [place] ECMO..    The donor lungs are then taken out of ice.  The appropriate timeout was performed again to establish the patient the donor ID and the ABO compatibility.  The lungs were then separated on the back table.  All the hilar structures were then dissected free.  The pulmonary artery pulmonary vein and bronchus were easily visualized and trimmed appropriately.      The left lung transplant was performed first.  Endostaplers were used to ligate the pulmonary artery and pulmonary vein.  The bronchus was then cut sharply.  The left lung was then removed from the patient's body.  Bleeding was controlled with electrocautery and with hemoclips.  The pulmonary artery and pulmonary vein were then dissected back to into the pericardium.  The bronchus was trimmed back to the level of the mainstem bronchus.  Hemostasis was controlled with hemoclips.  The left donor lung was brought out of ice.  The bronchial anastomosis was performed first.  This was performed with 3-0 Maxon suture.  The membranous portion of the airway wasanastomosed in a running fashion.  The cartilaginous rings were also anastomosed in a running fashion as well.  A vascular clamp was then placed on the cuff of the left atrium.  The recipient's pulmonary veins were then opened.  This anastomosis was performed with a 4-0 Prolene suture.  This anastomosis was not tied.   A vascular clamp was placed on the pulmonary artery.  The pulmonary artery was then opened to fashion a cuff.  This anastomosis was performed with 6-0 Prolene suture.  This anastomosis was not tied.  Appropriate de-airing maneuvers were then performed.  The pulmonary artery and pulmonary vein anastomosis were then tied.  The lung was the re expanded.    The right lung transplant was performed next. Endostaplers were used to ligate the pulmonary artery and pulmonary vein.  The bronchus was then cut sharply.  The right lung was then removed from the patient's body.  Bleeding was controlled with electrocautery and with hemoclips.  The pulmonary artery and pulmonary vein were then dissected back to into the pericardium.  The bronchus was trimmed back to the level of the mainstem bronchus.  Hemostasis was controlled with hemoclips.  The right donor lung was brought out of ice.  The bronchial anastomosis was performed first.  This was performed with 3-0 Maxon suture.  The membranous portion of the airway wasanastomosed in a running fashion.  The cartilaginous rings were also anastomosed in a running fashion as well.  A vascular clamp was then placed on the cuff of the left atrium.  The recipient's pulmonary veins were then opened.  This anastomosis was performed with a 4-0 Prolene suture.  This anastomosis was not tied.   A vascular clamp was placed on the pulmonary artery.  The pulmonary artery was then opened to fashion a cuff.  This anastomosis was performed with 6-0 Prolene suture.  This anastomosis was not tied.  Appropriate de-airing maneuvers were then performed.  The pulmonary artery and pulmonary vein anastomosis were then tied.  The lung was the re expanded.  The lung was then allowed to slowly reperfuse over a 15 minute period.      The patient was slowly weaned from [place] ECMO.  After being weaned from [place] ECMO cannulas were removed.  Cannulation sutures were then tied.  Hemostasis was controlled.    The patient had 2 chest tubes placed on the right and 2 chest tubes placed on the left side.  The 2 anterior chest tubes were 32 French straight chest tubes.  2 posterior chest tubes were 32 French right angled chest tubes.    Thoracotomy incision was closed.  Sterile dressings placed on in the operating room.  The double-lumen endotracheal tube was then changed out to a single-lumen endotracheal tube.  Flexible bronchoscopy was performed.  This demonstrated patent airway.    The patient was then transported to the CT ICU in stable but critical condition.    Teaching Surgeon Attestation: I was present for the procedure

## 2020-05-16 DIAGNOSIS — K219 Gastro-esophageal reflux disease without esophagitis: Principal | ICD-10-CM

## 2020-05-16 DIAGNOSIS — I272 Pulmonary hypertension, unspecified: Principal | ICD-10-CM

## 2020-05-16 DIAGNOSIS — Z20822 Contact with and (suspected) exposure to covid-19: Principal | ICD-10-CM

## 2020-05-16 DIAGNOSIS — Z942 Lung transplant status: Principal | ICD-10-CM

## 2020-05-16 DIAGNOSIS — T8119XA Other postprocedural shock, initial encounter: Principal | ICD-10-CM

## 2020-05-16 DIAGNOSIS — M1990 Unspecified osteoarthritis, unspecified site: Principal | ICD-10-CM

## 2020-05-16 DIAGNOSIS — Z7984 Long term (current) use of oral hypoglycemic drugs: Principal | ICD-10-CM

## 2020-05-16 DIAGNOSIS — Z9049 Acquired absence of other specified parts of digestive tract: Principal | ICD-10-CM

## 2020-05-16 DIAGNOSIS — J439 Emphysema, unspecified: Principal | ICD-10-CM

## 2020-05-16 DIAGNOSIS — T8182XA Emphysema (subcutaneous) resulting from a procedure, initial encounter: Principal | ICD-10-CM

## 2020-05-16 DIAGNOSIS — G4733 Obstructive sleep apnea (adult) (pediatric): Principal | ICD-10-CM

## 2020-05-16 DIAGNOSIS — G8918 Other acute postprocedural pain: Principal | ICD-10-CM

## 2020-05-16 DIAGNOSIS — Z8505 Personal history of malignant neoplasm of liver: Principal | ICD-10-CM

## 2020-05-16 DIAGNOSIS — Z9221 Personal history of antineoplastic chemotherapy: Principal | ICD-10-CM

## 2020-05-16 DIAGNOSIS — E1165 Type 2 diabetes mellitus with hyperglycemia: Principal | ICD-10-CM

## 2020-05-16 DIAGNOSIS — Z85038 Personal history of other malignant neoplasm of large intestine: Principal | ICD-10-CM

## 2020-05-16 DIAGNOSIS — E782 Mixed hyperlipidemia: Principal | ICD-10-CM

## 2020-05-16 DIAGNOSIS — J9612 Chronic respiratory failure with hypercapnia: Principal | ICD-10-CM

## 2020-05-16 DIAGNOSIS — E11649 Type 2 diabetes mellitus with hypoglycemia without coma: Principal | ICD-10-CM

## 2020-05-16 DIAGNOSIS — Z7982 Long term (current) use of aspirin: Principal | ICD-10-CM

## 2020-05-16 DIAGNOSIS — T86812 Lung transplant infection: Principal | ICD-10-CM

## 2020-05-16 DIAGNOSIS — J9611 Chronic respiratory failure with hypoxia: Principal | ICD-10-CM

## 2020-05-16 DIAGNOSIS — I10 Essential (primary) hypertension: Principal | ICD-10-CM

## 2020-05-16 DIAGNOSIS — B371 Pulmonary candidiasis: Principal | ICD-10-CM

## 2020-05-16 DIAGNOSIS — Z87891 Personal history of nicotine dependence: Principal | ICD-10-CM

## 2020-05-16 DIAGNOSIS — D62 Acute posthemorrhagic anemia: Principal | ICD-10-CM

## 2020-05-16 DIAGNOSIS — J84112 Idiopathic pulmonary fibrosis: Principal | ICD-10-CM

## 2020-05-16 DIAGNOSIS — D509 Iron deficiency anemia, unspecified: Principal | ICD-10-CM

## 2020-05-16 DIAGNOSIS — Z9981 Dependence on supplemental oxygen: Principal | ICD-10-CM

## 2020-05-16 DIAGNOSIS — D72828 Other elevated white blood cell count: Principal | ICD-10-CM

## 2020-05-16 MED ORDER — TACROLIMUS 1 MG CAPSULE, IMMEDIATE-RELEASE
ORAL_CAPSULE | Freq: Every day | ORAL | 0 refills | 30 days
Start: 2020-05-16 — End: 2020-05-16

## 2020-05-16 MED ORDER — VALGANCICLOVIR 450 MG TABLET
ORAL_TABLET | Freq: Every day | ORAL | 0 refills | 30 days
Start: 2020-05-16 — End: 2020-05-16

## 2020-05-16 MED ORDER — POSACONAZOLE 100 MG TABLET,DELAYED RELEASE
ORAL_TABLET | Freq: Every day | ORAL | 2 refills | 30.00000 days | Status: CP
Start: 2020-05-16 — End: 2020-05-27

## 2020-05-16 MED ORDER — MYCOPHENOLATE MOFETIL 500 MG TABLET
ORAL_TABLET | Freq: Two times a day (BID) | ORAL | 0 refills | 30 days
Start: 2020-05-16 — End: 2020-05-16

## 2020-05-17 DIAGNOSIS — Z20822 Contact with and (suspected) exposure to covid-19: Principal | ICD-10-CM

## 2020-05-17 DIAGNOSIS — G4733 Obstructive sleep apnea (adult) (pediatric): Principal | ICD-10-CM

## 2020-05-17 DIAGNOSIS — D62 Acute posthemorrhagic anemia: Principal | ICD-10-CM

## 2020-05-17 DIAGNOSIS — Z87891 Personal history of nicotine dependence: Principal | ICD-10-CM

## 2020-05-17 DIAGNOSIS — E782 Mixed hyperlipidemia: Principal | ICD-10-CM

## 2020-05-17 DIAGNOSIS — J84112 Idiopathic pulmonary fibrosis: Principal | ICD-10-CM

## 2020-05-17 DIAGNOSIS — I10 Essential (primary) hypertension: Principal | ICD-10-CM

## 2020-05-17 DIAGNOSIS — D72828 Other elevated white blood cell count: Principal | ICD-10-CM

## 2020-05-17 DIAGNOSIS — D509 Iron deficiency anemia, unspecified: Principal | ICD-10-CM

## 2020-05-17 DIAGNOSIS — J439 Emphysema, unspecified: Principal | ICD-10-CM

## 2020-05-17 DIAGNOSIS — Z9981 Dependence on supplemental oxygen: Principal | ICD-10-CM

## 2020-05-17 DIAGNOSIS — J9612 Chronic respiratory failure with hypercapnia: Principal | ICD-10-CM

## 2020-05-17 DIAGNOSIS — T8182XA Emphysema (subcutaneous) resulting from a procedure, initial encounter: Principal | ICD-10-CM

## 2020-05-17 DIAGNOSIS — G8918 Other acute postprocedural pain: Principal | ICD-10-CM

## 2020-05-17 DIAGNOSIS — Z7982 Long term (current) use of aspirin: Principal | ICD-10-CM

## 2020-05-17 DIAGNOSIS — B371 Pulmonary candidiasis: Principal | ICD-10-CM

## 2020-05-17 DIAGNOSIS — I272 Pulmonary hypertension, unspecified: Principal | ICD-10-CM

## 2020-05-17 DIAGNOSIS — Z8505 Personal history of malignant neoplasm of liver: Principal | ICD-10-CM

## 2020-05-17 DIAGNOSIS — J9611 Chronic respiratory failure with hypoxia: Principal | ICD-10-CM

## 2020-05-17 DIAGNOSIS — Z9049 Acquired absence of other specified parts of digestive tract: Principal | ICD-10-CM

## 2020-05-17 DIAGNOSIS — E1165 Type 2 diabetes mellitus with hyperglycemia: Principal | ICD-10-CM

## 2020-05-17 DIAGNOSIS — E11649 Type 2 diabetes mellitus with hypoglycemia without coma: Principal | ICD-10-CM

## 2020-05-17 DIAGNOSIS — K219 Gastro-esophageal reflux disease without esophagitis: Principal | ICD-10-CM

## 2020-05-17 DIAGNOSIS — M1990 Unspecified osteoarthritis, unspecified site: Principal | ICD-10-CM

## 2020-05-17 DIAGNOSIS — T8119XA Other postprocedural shock, initial encounter: Principal | ICD-10-CM

## 2020-05-17 DIAGNOSIS — Z7984 Long term (current) use of oral hypoglycemic drugs: Principal | ICD-10-CM

## 2020-05-17 DIAGNOSIS — Z85038 Personal history of other malignant neoplasm of large intestine: Principal | ICD-10-CM

## 2020-05-17 DIAGNOSIS — Z9221 Personal history of antineoplastic chemotherapy: Principal | ICD-10-CM

## 2020-05-17 DIAGNOSIS — T86812 Lung transplant infection: Principal | ICD-10-CM

## 2020-05-18 DIAGNOSIS — J439 Emphysema, unspecified: Principal | ICD-10-CM

## 2020-05-18 DIAGNOSIS — Z7982 Long term (current) use of aspirin: Principal | ICD-10-CM

## 2020-05-18 DIAGNOSIS — Z85038 Personal history of other malignant neoplasm of large intestine: Principal | ICD-10-CM

## 2020-05-18 DIAGNOSIS — G4733 Obstructive sleep apnea (adult) (pediatric): Principal | ICD-10-CM

## 2020-05-18 DIAGNOSIS — J9611 Chronic respiratory failure with hypoxia: Principal | ICD-10-CM

## 2020-05-18 DIAGNOSIS — J9612 Chronic respiratory failure with hypercapnia: Principal | ICD-10-CM

## 2020-05-18 DIAGNOSIS — E11649 Type 2 diabetes mellitus with hypoglycemia without coma: Principal | ICD-10-CM

## 2020-05-18 DIAGNOSIS — E1165 Type 2 diabetes mellitus with hyperglycemia: Principal | ICD-10-CM

## 2020-05-18 DIAGNOSIS — G8918 Other acute postprocedural pain: Principal | ICD-10-CM

## 2020-05-18 DIAGNOSIS — Z9981 Dependence on supplemental oxygen: Principal | ICD-10-CM

## 2020-05-18 DIAGNOSIS — D72828 Other elevated white blood cell count: Principal | ICD-10-CM

## 2020-05-18 DIAGNOSIS — Z87891 Personal history of nicotine dependence: Principal | ICD-10-CM

## 2020-05-18 DIAGNOSIS — J84112 Idiopathic pulmonary fibrosis: Principal | ICD-10-CM

## 2020-05-18 DIAGNOSIS — E782 Mixed hyperlipidemia: Principal | ICD-10-CM

## 2020-05-18 DIAGNOSIS — Z8505 Personal history of malignant neoplasm of liver: Principal | ICD-10-CM

## 2020-05-18 DIAGNOSIS — Z7984 Long term (current) use of oral hypoglycemic drugs: Principal | ICD-10-CM

## 2020-05-18 DIAGNOSIS — I272 Pulmonary hypertension, unspecified: Principal | ICD-10-CM

## 2020-05-18 DIAGNOSIS — T8182XA Emphysema (subcutaneous) resulting from a procedure, initial encounter: Principal | ICD-10-CM

## 2020-05-18 DIAGNOSIS — M1990 Unspecified osteoarthritis, unspecified site: Principal | ICD-10-CM

## 2020-05-18 DIAGNOSIS — D62 Acute posthemorrhagic anemia: Principal | ICD-10-CM

## 2020-05-18 DIAGNOSIS — T8119XA Other postprocedural shock, initial encounter: Principal | ICD-10-CM

## 2020-05-18 DIAGNOSIS — D509 Iron deficiency anemia, unspecified: Principal | ICD-10-CM

## 2020-05-18 DIAGNOSIS — T86812 Lung transplant infection: Principal | ICD-10-CM

## 2020-05-18 DIAGNOSIS — B371 Pulmonary candidiasis: Principal | ICD-10-CM

## 2020-05-18 DIAGNOSIS — K219 Gastro-esophageal reflux disease without esophagitis: Principal | ICD-10-CM

## 2020-05-18 DIAGNOSIS — Z9221 Personal history of antineoplastic chemotherapy: Principal | ICD-10-CM

## 2020-05-18 DIAGNOSIS — Z20822 Contact with and (suspected) exposure to covid-19: Principal | ICD-10-CM

## 2020-05-18 DIAGNOSIS — Z9049 Acquired absence of other specified parts of digestive tract: Principal | ICD-10-CM

## 2020-05-18 DIAGNOSIS — I10 Essential (primary) hypertension: Principal | ICD-10-CM

## 2020-05-19 DIAGNOSIS — E1165 Type 2 diabetes mellitus with hyperglycemia: Principal | ICD-10-CM

## 2020-05-19 DIAGNOSIS — T8119XA Other postprocedural shock, initial encounter: Principal | ICD-10-CM

## 2020-05-19 DIAGNOSIS — B371 Pulmonary candidiasis: Principal | ICD-10-CM

## 2020-05-19 DIAGNOSIS — J84112 Idiopathic pulmonary fibrosis: Principal | ICD-10-CM

## 2020-05-19 DIAGNOSIS — D62 Acute posthemorrhagic anemia: Principal | ICD-10-CM

## 2020-05-19 DIAGNOSIS — Z7984 Long term (current) use of oral hypoglycemic drugs: Principal | ICD-10-CM

## 2020-05-19 DIAGNOSIS — G8918 Other acute postprocedural pain: Principal | ICD-10-CM

## 2020-05-19 DIAGNOSIS — G4733 Obstructive sleep apnea (adult) (pediatric): Principal | ICD-10-CM

## 2020-05-19 DIAGNOSIS — J9611 Chronic respiratory failure with hypoxia: Principal | ICD-10-CM

## 2020-05-19 DIAGNOSIS — Z20822 Contact with and (suspected) exposure to covid-19: Principal | ICD-10-CM

## 2020-05-19 DIAGNOSIS — Z85038 Personal history of other malignant neoplasm of large intestine: Principal | ICD-10-CM

## 2020-05-19 DIAGNOSIS — Z87891 Personal history of nicotine dependence: Principal | ICD-10-CM

## 2020-05-19 DIAGNOSIS — D509 Iron deficiency anemia, unspecified: Principal | ICD-10-CM

## 2020-05-19 DIAGNOSIS — Z9221 Personal history of antineoplastic chemotherapy: Principal | ICD-10-CM

## 2020-05-19 DIAGNOSIS — Z9049 Acquired absence of other specified parts of digestive tract: Principal | ICD-10-CM

## 2020-05-19 DIAGNOSIS — J439 Emphysema, unspecified: Principal | ICD-10-CM

## 2020-05-19 DIAGNOSIS — K219 Gastro-esophageal reflux disease without esophagitis: Principal | ICD-10-CM

## 2020-05-19 DIAGNOSIS — T86812 Lung transplant infection: Principal | ICD-10-CM

## 2020-05-19 DIAGNOSIS — D72828 Other elevated white blood cell count: Principal | ICD-10-CM

## 2020-05-19 DIAGNOSIS — T8182XA Emphysema (subcutaneous) resulting from a procedure, initial encounter: Principal | ICD-10-CM

## 2020-05-19 DIAGNOSIS — Z7982 Long term (current) use of aspirin: Principal | ICD-10-CM

## 2020-05-19 DIAGNOSIS — Z9981 Dependence on supplemental oxygen: Principal | ICD-10-CM

## 2020-05-19 DIAGNOSIS — E11649 Type 2 diabetes mellitus with hypoglycemia without coma: Principal | ICD-10-CM

## 2020-05-19 DIAGNOSIS — E782 Mixed hyperlipidemia: Principal | ICD-10-CM

## 2020-05-19 DIAGNOSIS — Z8505 Personal history of malignant neoplasm of liver: Principal | ICD-10-CM

## 2020-05-19 DIAGNOSIS — I272 Pulmonary hypertension, unspecified: Principal | ICD-10-CM

## 2020-05-19 DIAGNOSIS — J9612 Chronic respiratory failure with hypercapnia: Principal | ICD-10-CM

## 2020-05-19 DIAGNOSIS — M1990 Unspecified osteoarthritis, unspecified site: Principal | ICD-10-CM

## 2020-05-19 DIAGNOSIS — I10 Essential (primary) hypertension: Principal | ICD-10-CM

## 2020-05-20 DIAGNOSIS — D62 Acute posthemorrhagic anemia: Principal | ICD-10-CM

## 2020-05-20 DIAGNOSIS — Z9981 Dependence on supplemental oxygen: Principal | ICD-10-CM

## 2020-05-20 DIAGNOSIS — Z20822 Contact with and (suspected) exposure to covid-19: Principal | ICD-10-CM

## 2020-05-20 DIAGNOSIS — Z7984 Long term (current) use of oral hypoglycemic drugs: Principal | ICD-10-CM

## 2020-05-20 DIAGNOSIS — J9612 Chronic respiratory failure with hypercapnia: Principal | ICD-10-CM

## 2020-05-20 DIAGNOSIS — J439 Emphysema, unspecified: Principal | ICD-10-CM

## 2020-05-20 DIAGNOSIS — Z87891 Personal history of nicotine dependence: Principal | ICD-10-CM

## 2020-05-20 DIAGNOSIS — M1990 Unspecified osteoarthritis, unspecified site: Principal | ICD-10-CM

## 2020-05-20 DIAGNOSIS — Z9049 Acquired absence of other specified parts of digestive tract: Principal | ICD-10-CM

## 2020-05-20 DIAGNOSIS — K219 Gastro-esophageal reflux disease without esophagitis: Principal | ICD-10-CM

## 2020-05-20 DIAGNOSIS — J9611 Chronic respiratory failure with hypoxia: Principal | ICD-10-CM

## 2020-05-20 DIAGNOSIS — G8918 Other acute postprocedural pain: Principal | ICD-10-CM

## 2020-05-20 DIAGNOSIS — T8119XA Other postprocedural shock, initial encounter: Principal | ICD-10-CM

## 2020-05-20 DIAGNOSIS — G4733 Obstructive sleep apnea (adult) (pediatric): Principal | ICD-10-CM

## 2020-05-20 DIAGNOSIS — E1165 Type 2 diabetes mellitus with hyperglycemia: Principal | ICD-10-CM

## 2020-05-20 DIAGNOSIS — E11649 Type 2 diabetes mellitus with hypoglycemia without coma: Principal | ICD-10-CM

## 2020-05-20 DIAGNOSIS — Z85038 Personal history of other malignant neoplasm of large intestine: Principal | ICD-10-CM

## 2020-05-20 DIAGNOSIS — D509 Iron deficiency anemia, unspecified: Principal | ICD-10-CM

## 2020-05-20 DIAGNOSIS — D72828 Other elevated white blood cell count: Principal | ICD-10-CM

## 2020-05-20 DIAGNOSIS — I10 Essential (primary) hypertension: Principal | ICD-10-CM

## 2020-05-20 DIAGNOSIS — I272 Pulmonary hypertension, unspecified: Principal | ICD-10-CM

## 2020-05-20 DIAGNOSIS — B371 Pulmonary candidiasis: Principal | ICD-10-CM

## 2020-05-20 DIAGNOSIS — T8182XA Emphysema (subcutaneous) resulting from a procedure, initial encounter: Principal | ICD-10-CM

## 2020-05-20 DIAGNOSIS — E782 Mixed hyperlipidemia: Principal | ICD-10-CM

## 2020-05-20 DIAGNOSIS — Z7982 Long term (current) use of aspirin: Principal | ICD-10-CM

## 2020-05-20 DIAGNOSIS — J84112 Idiopathic pulmonary fibrosis: Principal | ICD-10-CM

## 2020-05-20 DIAGNOSIS — Z8505 Personal history of malignant neoplasm of liver: Principal | ICD-10-CM

## 2020-05-20 DIAGNOSIS — Z9221 Personal history of antineoplastic chemotherapy: Principal | ICD-10-CM

## 2020-05-20 DIAGNOSIS — T86812 Lung transplant infection: Principal | ICD-10-CM

## 2020-05-20 MED ORDER — OSELTAMIVIR 75 MG CAPSULE
ORAL_CAPSULE | Freq: Every day | ORAL | 0 refills | 30.00000 days
Start: 2020-05-20 — End: 2020-05-20

## 2020-05-21 DIAGNOSIS — J9611 Chronic respiratory failure with hypoxia: Principal | ICD-10-CM

## 2020-05-21 DIAGNOSIS — B371 Pulmonary candidiasis: Principal | ICD-10-CM

## 2020-05-21 DIAGNOSIS — I272 Pulmonary hypertension, unspecified: Principal | ICD-10-CM

## 2020-05-21 DIAGNOSIS — Z7984 Long term (current) use of oral hypoglycemic drugs: Principal | ICD-10-CM

## 2020-05-21 DIAGNOSIS — Z9981 Dependence on supplemental oxygen: Principal | ICD-10-CM

## 2020-05-21 DIAGNOSIS — D62 Acute posthemorrhagic anemia: Principal | ICD-10-CM

## 2020-05-21 DIAGNOSIS — Z87891 Personal history of nicotine dependence: Principal | ICD-10-CM

## 2020-05-21 DIAGNOSIS — J439 Emphysema, unspecified: Principal | ICD-10-CM

## 2020-05-21 DIAGNOSIS — E782 Mixed hyperlipidemia: Principal | ICD-10-CM

## 2020-05-21 DIAGNOSIS — J9612 Chronic respiratory failure with hypercapnia: Principal | ICD-10-CM

## 2020-05-21 DIAGNOSIS — Z85038 Personal history of other malignant neoplasm of large intestine: Principal | ICD-10-CM

## 2020-05-21 DIAGNOSIS — G4733 Obstructive sleep apnea (adult) (pediatric): Principal | ICD-10-CM

## 2020-05-21 DIAGNOSIS — T8119XA Other postprocedural shock, initial encounter: Principal | ICD-10-CM

## 2020-05-21 DIAGNOSIS — K219 Gastro-esophageal reflux disease without esophagitis: Principal | ICD-10-CM

## 2020-05-21 DIAGNOSIS — J84112 Idiopathic pulmonary fibrosis: Principal | ICD-10-CM

## 2020-05-21 DIAGNOSIS — Z9221 Personal history of antineoplastic chemotherapy: Principal | ICD-10-CM

## 2020-05-21 DIAGNOSIS — M1990 Unspecified osteoarthritis, unspecified site: Principal | ICD-10-CM

## 2020-05-21 DIAGNOSIS — G8918 Other acute postprocedural pain: Principal | ICD-10-CM

## 2020-05-21 DIAGNOSIS — E1165 Type 2 diabetes mellitus with hyperglycemia: Principal | ICD-10-CM

## 2020-05-21 DIAGNOSIS — T8182XA Emphysema (subcutaneous) resulting from a procedure, initial encounter: Principal | ICD-10-CM

## 2020-05-21 DIAGNOSIS — Z8505 Personal history of malignant neoplasm of liver: Principal | ICD-10-CM

## 2020-05-21 DIAGNOSIS — D72828 Other elevated white blood cell count: Principal | ICD-10-CM

## 2020-05-21 DIAGNOSIS — Z9049 Acquired absence of other specified parts of digestive tract: Principal | ICD-10-CM

## 2020-05-21 DIAGNOSIS — I10 Essential (primary) hypertension: Principal | ICD-10-CM

## 2020-05-21 DIAGNOSIS — T86812 Lung transplant infection: Principal | ICD-10-CM

## 2020-05-21 DIAGNOSIS — Z20822 Contact with and (suspected) exposure to covid-19: Principal | ICD-10-CM

## 2020-05-21 DIAGNOSIS — Z7982 Long term (current) use of aspirin: Principal | ICD-10-CM

## 2020-05-21 DIAGNOSIS — E11649 Type 2 diabetes mellitus with hypoglycemia without coma: Principal | ICD-10-CM

## 2020-05-21 DIAGNOSIS — D509 Iron deficiency anemia, unspecified: Principal | ICD-10-CM

## 2020-05-22 DIAGNOSIS — G4733 Obstructive sleep apnea (adult) (pediatric): Principal | ICD-10-CM

## 2020-05-22 DIAGNOSIS — Z7982 Long term (current) use of aspirin: Principal | ICD-10-CM

## 2020-05-22 DIAGNOSIS — Z9981 Dependence on supplemental oxygen: Principal | ICD-10-CM

## 2020-05-22 DIAGNOSIS — E782 Mixed hyperlipidemia: Principal | ICD-10-CM

## 2020-05-22 DIAGNOSIS — D72828 Other elevated white blood cell count: Principal | ICD-10-CM

## 2020-05-22 DIAGNOSIS — Z7984 Long term (current) use of oral hypoglycemic drugs: Principal | ICD-10-CM

## 2020-05-22 DIAGNOSIS — Z87891 Personal history of nicotine dependence: Principal | ICD-10-CM

## 2020-05-22 DIAGNOSIS — Z85038 Personal history of other malignant neoplasm of large intestine: Principal | ICD-10-CM

## 2020-05-22 DIAGNOSIS — E11649 Type 2 diabetes mellitus with hypoglycemia without coma: Principal | ICD-10-CM

## 2020-05-22 DIAGNOSIS — J9611 Chronic respiratory failure with hypoxia: Principal | ICD-10-CM

## 2020-05-22 DIAGNOSIS — Z20822 Contact with and (suspected) exposure to covid-19: Principal | ICD-10-CM

## 2020-05-22 DIAGNOSIS — T86812 Lung transplant infection: Principal | ICD-10-CM

## 2020-05-22 DIAGNOSIS — T8182XA Emphysema (subcutaneous) resulting from a procedure, initial encounter: Principal | ICD-10-CM

## 2020-05-22 DIAGNOSIS — B371 Pulmonary candidiasis: Principal | ICD-10-CM

## 2020-05-22 DIAGNOSIS — Z9049 Acquired absence of other specified parts of digestive tract: Principal | ICD-10-CM

## 2020-05-22 DIAGNOSIS — Z8505 Personal history of malignant neoplasm of liver: Principal | ICD-10-CM

## 2020-05-22 DIAGNOSIS — I272 Pulmonary hypertension, unspecified: Principal | ICD-10-CM

## 2020-05-22 DIAGNOSIS — T8119XA Other postprocedural shock, initial encounter: Principal | ICD-10-CM

## 2020-05-22 DIAGNOSIS — J439 Emphysema, unspecified: Principal | ICD-10-CM

## 2020-05-22 DIAGNOSIS — G8918 Other acute postprocedural pain: Principal | ICD-10-CM

## 2020-05-22 DIAGNOSIS — J84112 Idiopathic pulmonary fibrosis: Principal | ICD-10-CM

## 2020-05-22 DIAGNOSIS — M1990 Unspecified osteoarthritis, unspecified site: Principal | ICD-10-CM

## 2020-05-22 DIAGNOSIS — K219 Gastro-esophageal reflux disease without esophagitis: Principal | ICD-10-CM

## 2020-05-22 DIAGNOSIS — J9612 Chronic respiratory failure with hypercapnia: Principal | ICD-10-CM

## 2020-05-22 DIAGNOSIS — E1165 Type 2 diabetes mellitus with hyperglycemia: Principal | ICD-10-CM

## 2020-05-22 DIAGNOSIS — I10 Essential (primary) hypertension: Principal | ICD-10-CM

## 2020-05-22 DIAGNOSIS — D62 Acute posthemorrhagic anemia: Principal | ICD-10-CM

## 2020-05-22 DIAGNOSIS — Z9221 Personal history of antineoplastic chemotherapy: Principal | ICD-10-CM

## 2020-05-22 DIAGNOSIS — D509 Iron deficiency anemia, unspecified: Principal | ICD-10-CM

## 2020-05-22 MED ORDER — OSELTAMIVIR 75 MG CAPSULE
ORAL_CAPSULE | Freq: Every day | ORAL | 3 refills | 30 days | Status: CP
Start: 2020-05-22 — End: 2020-05-27

## 2020-05-23 DIAGNOSIS — Z20822 Contact with and (suspected) exposure to covid-19: Principal | ICD-10-CM

## 2020-05-23 DIAGNOSIS — J439 Emphysema, unspecified: Principal | ICD-10-CM

## 2020-05-23 DIAGNOSIS — G8918 Other acute postprocedural pain: Principal | ICD-10-CM

## 2020-05-23 DIAGNOSIS — T8182XA Emphysema (subcutaneous) resulting from a procedure, initial encounter: Principal | ICD-10-CM

## 2020-05-23 DIAGNOSIS — E1165 Type 2 diabetes mellitus with hyperglycemia: Principal | ICD-10-CM

## 2020-05-23 DIAGNOSIS — D72828 Other elevated white blood cell count: Principal | ICD-10-CM

## 2020-05-23 DIAGNOSIS — D62 Acute posthemorrhagic anemia: Principal | ICD-10-CM

## 2020-05-23 DIAGNOSIS — J84112 Idiopathic pulmonary fibrosis: Principal | ICD-10-CM

## 2020-05-23 DIAGNOSIS — D509 Iron deficiency anemia, unspecified: Principal | ICD-10-CM

## 2020-05-23 DIAGNOSIS — G4733 Obstructive sleep apnea (adult) (pediatric): Principal | ICD-10-CM

## 2020-05-23 DIAGNOSIS — B371 Pulmonary candidiasis: Principal | ICD-10-CM

## 2020-05-23 DIAGNOSIS — I272 Pulmonary hypertension, unspecified: Principal | ICD-10-CM

## 2020-05-23 DIAGNOSIS — Z7984 Long term (current) use of oral hypoglycemic drugs: Principal | ICD-10-CM

## 2020-05-23 DIAGNOSIS — Z8505 Personal history of malignant neoplasm of liver: Principal | ICD-10-CM

## 2020-05-23 DIAGNOSIS — Z9981 Dependence on supplemental oxygen: Principal | ICD-10-CM

## 2020-05-23 DIAGNOSIS — Z85038 Personal history of other malignant neoplasm of large intestine: Principal | ICD-10-CM

## 2020-05-23 DIAGNOSIS — E782 Mixed hyperlipidemia: Principal | ICD-10-CM

## 2020-05-23 DIAGNOSIS — Z9221 Personal history of antineoplastic chemotherapy: Principal | ICD-10-CM

## 2020-05-23 DIAGNOSIS — Z9049 Acquired absence of other specified parts of digestive tract: Principal | ICD-10-CM

## 2020-05-23 DIAGNOSIS — T86812 Lung transplant infection: Principal | ICD-10-CM

## 2020-05-23 DIAGNOSIS — E11649 Type 2 diabetes mellitus with hypoglycemia without coma: Principal | ICD-10-CM

## 2020-05-23 DIAGNOSIS — T8119XA Other postprocedural shock, initial encounter: Principal | ICD-10-CM

## 2020-05-23 DIAGNOSIS — Z87891 Personal history of nicotine dependence: Principal | ICD-10-CM

## 2020-05-23 DIAGNOSIS — I10 Essential (primary) hypertension: Principal | ICD-10-CM

## 2020-05-23 DIAGNOSIS — K219 Gastro-esophageal reflux disease without esophagitis: Principal | ICD-10-CM

## 2020-05-23 DIAGNOSIS — M1990 Unspecified osteoarthritis, unspecified site: Principal | ICD-10-CM

## 2020-05-23 DIAGNOSIS — J9612 Chronic respiratory failure with hypercapnia: Principal | ICD-10-CM

## 2020-05-23 DIAGNOSIS — Z7982 Long term (current) use of aspirin: Principal | ICD-10-CM

## 2020-05-23 DIAGNOSIS — Z942 Lung transplant status: Principal | ICD-10-CM

## 2020-05-23 DIAGNOSIS — J9611 Chronic respiratory failure with hypoxia: Principal | ICD-10-CM

## 2020-05-23 MED ORDER — PEN NEEDLE, DIABETIC 32 GAUGE X 5/32" (4 MM)
2 refills | 0 days
Start: 2020-05-23 — End: 2020-06-11

## 2020-05-23 MED ORDER — POSACONAZOLE 100 MG TABLET,DELAYED RELEASE
ORAL_TABLET | Freq: Every day | ORAL | 0 refills | 30 days
Start: 2020-05-23 — End: 2020-05-23

## 2020-05-23 MED ORDER — NOVOLIN N FLEXPEN 100 UNIT/ML (3 ML) SUBCUTANEOUS INSULIN PEN
Freq: Every day | SUBCUTANEOUS | 2 refills | 250.00000 days | Status: CP
Start: 2020-05-23 — End: 2020-05-27

## 2020-05-23 MED ORDER — INSULIN ASPART (U-100) 100 UNIT/ML (3 ML) SUBCUTANEOUS PEN
Freq: Three times a day (TID) | SUBCUTANEOUS | 2 refills | 167 days | Status: CP
Start: 2020-05-23 — End: 2020-05-27

## 2020-05-24 DIAGNOSIS — Z8505 Personal history of malignant neoplasm of liver: Principal | ICD-10-CM

## 2020-05-24 DIAGNOSIS — J84112 Idiopathic pulmonary fibrosis: Principal | ICD-10-CM

## 2020-05-24 DIAGNOSIS — J439 Emphysema, unspecified: Principal | ICD-10-CM

## 2020-05-24 DIAGNOSIS — Z87891 Personal history of nicotine dependence: Principal | ICD-10-CM

## 2020-05-24 DIAGNOSIS — M1990 Unspecified osteoarthritis, unspecified site: Principal | ICD-10-CM

## 2020-05-24 DIAGNOSIS — I10 Essential (primary) hypertension: Principal | ICD-10-CM

## 2020-05-24 DIAGNOSIS — G4733 Obstructive sleep apnea (adult) (pediatric): Principal | ICD-10-CM

## 2020-05-24 DIAGNOSIS — G8918 Other acute postprocedural pain: Principal | ICD-10-CM

## 2020-05-24 DIAGNOSIS — D509 Iron deficiency anemia, unspecified: Principal | ICD-10-CM

## 2020-05-24 DIAGNOSIS — T86812 Lung transplant infection: Principal | ICD-10-CM

## 2020-05-24 DIAGNOSIS — E782 Mixed hyperlipidemia: Principal | ICD-10-CM

## 2020-05-24 DIAGNOSIS — Z9049 Acquired absence of other specified parts of digestive tract: Principal | ICD-10-CM

## 2020-05-24 DIAGNOSIS — T8182XA Emphysema (subcutaneous) resulting from a procedure, initial encounter: Principal | ICD-10-CM

## 2020-05-24 DIAGNOSIS — Z7984 Long term (current) use of oral hypoglycemic drugs: Principal | ICD-10-CM

## 2020-05-24 DIAGNOSIS — D62 Acute posthemorrhagic anemia: Principal | ICD-10-CM

## 2020-05-24 DIAGNOSIS — E11649 Type 2 diabetes mellitus with hypoglycemia without coma: Principal | ICD-10-CM

## 2020-05-24 DIAGNOSIS — Z9221 Personal history of antineoplastic chemotherapy: Principal | ICD-10-CM

## 2020-05-24 DIAGNOSIS — K219 Gastro-esophageal reflux disease without esophagitis: Principal | ICD-10-CM

## 2020-05-24 DIAGNOSIS — T8119XA Other postprocedural shock, initial encounter: Principal | ICD-10-CM

## 2020-05-24 DIAGNOSIS — J9612 Chronic respiratory failure with hypercapnia: Principal | ICD-10-CM

## 2020-05-24 DIAGNOSIS — Z9981 Dependence on supplemental oxygen: Principal | ICD-10-CM

## 2020-05-24 DIAGNOSIS — Z85038 Personal history of other malignant neoplasm of large intestine: Principal | ICD-10-CM

## 2020-05-24 DIAGNOSIS — Z20822 Contact with and (suspected) exposure to covid-19: Principal | ICD-10-CM

## 2020-05-24 DIAGNOSIS — B371 Pulmonary candidiasis: Principal | ICD-10-CM

## 2020-05-24 DIAGNOSIS — I272 Pulmonary hypertension, unspecified: Principal | ICD-10-CM

## 2020-05-24 DIAGNOSIS — Z7982 Long term (current) use of aspirin: Principal | ICD-10-CM

## 2020-05-24 DIAGNOSIS — J9611 Chronic respiratory failure with hypoxia: Principal | ICD-10-CM

## 2020-05-24 DIAGNOSIS — D72828 Other elevated white blood cell count: Principal | ICD-10-CM

## 2020-05-24 DIAGNOSIS — E1165 Type 2 diabetes mellitus with hyperglycemia: Principal | ICD-10-CM

## 2020-05-25 DIAGNOSIS — T8182XA Emphysema (subcutaneous) resulting from a procedure, initial encounter: Principal | ICD-10-CM

## 2020-05-25 DIAGNOSIS — G4733 Obstructive sleep apnea (adult) (pediatric): Principal | ICD-10-CM

## 2020-05-25 DIAGNOSIS — Z85038 Personal history of other malignant neoplasm of large intestine: Principal | ICD-10-CM

## 2020-05-25 DIAGNOSIS — B371 Pulmonary candidiasis: Principal | ICD-10-CM

## 2020-05-25 DIAGNOSIS — Z9981 Dependence on supplemental oxygen: Principal | ICD-10-CM

## 2020-05-25 DIAGNOSIS — J9611 Chronic respiratory failure with hypoxia: Principal | ICD-10-CM

## 2020-05-25 DIAGNOSIS — J84112 Idiopathic pulmonary fibrosis: Principal | ICD-10-CM

## 2020-05-25 DIAGNOSIS — Z7982 Long term (current) use of aspirin: Principal | ICD-10-CM

## 2020-05-25 DIAGNOSIS — J439 Emphysema, unspecified: Principal | ICD-10-CM

## 2020-05-25 DIAGNOSIS — I272 Pulmonary hypertension, unspecified: Principal | ICD-10-CM

## 2020-05-25 DIAGNOSIS — T86812 Lung transplant infection: Principal | ICD-10-CM

## 2020-05-25 DIAGNOSIS — M1990 Unspecified osteoarthritis, unspecified site: Principal | ICD-10-CM

## 2020-05-25 DIAGNOSIS — T8119XA Other postprocedural shock, initial encounter: Principal | ICD-10-CM

## 2020-05-25 DIAGNOSIS — Z20822 Contact with and (suspected) exposure to covid-19: Principal | ICD-10-CM

## 2020-05-25 DIAGNOSIS — I10 Essential (primary) hypertension: Principal | ICD-10-CM

## 2020-05-25 DIAGNOSIS — E11649 Type 2 diabetes mellitus with hypoglycemia without coma: Principal | ICD-10-CM

## 2020-05-25 DIAGNOSIS — Z8505 Personal history of malignant neoplasm of liver: Principal | ICD-10-CM

## 2020-05-25 DIAGNOSIS — D509 Iron deficiency anemia, unspecified: Principal | ICD-10-CM

## 2020-05-25 DIAGNOSIS — E782 Mixed hyperlipidemia: Principal | ICD-10-CM

## 2020-05-25 DIAGNOSIS — Z9049 Acquired absence of other specified parts of digestive tract: Principal | ICD-10-CM

## 2020-05-25 DIAGNOSIS — E1165 Type 2 diabetes mellitus with hyperglycemia: Principal | ICD-10-CM

## 2020-05-25 DIAGNOSIS — Z9221 Personal history of antineoplastic chemotherapy: Principal | ICD-10-CM

## 2020-05-25 DIAGNOSIS — D62 Acute posthemorrhagic anemia: Principal | ICD-10-CM

## 2020-05-25 DIAGNOSIS — Z87891 Personal history of nicotine dependence: Principal | ICD-10-CM

## 2020-05-25 DIAGNOSIS — K219 Gastro-esophageal reflux disease without esophagitis: Principal | ICD-10-CM

## 2020-05-25 DIAGNOSIS — Z7984 Long term (current) use of oral hypoglycemic drugs: Principal | ICD-10-CM

## 2020-05-25 DIAGNOSIS — G8918 Other acute postprocedural pain: Principal | ICD-10-CM

## 2020-05-25 DIAGNOSIS — D72828 Other elevated white blood cell count: Principal | ICD-10-CM

## 2020-05-25 DIAGNOSIS — J9612 Chronic respiratory failure with hypercapnia: Principal | ICD-10-CM

## 2020-05-26 DIAGNOSIS — E11649 Type 2 diabetes mellitus with hypoglycemia without coma: Principal | ICD-10-CM

## 2020-05-26 DIAGNOSIS — T8182XA Emphysema (subcutaneous) resulting from a procedure, initial encounter: Principal | ICD-10-CM

## 2020-05-26 DIAGNOSIS — I10 Essential (primary) hypertension: Principal | ICD-10-CM

## 2020-05-26 DIAGNOSIS — M1990 Unspecified osteoarthritis, unspecified site: Principal | ICD-10-CM

## 2020-05-26 DIAGNOSIS — B371 Pulmonary candidiasis: Principal | ICD-10-CM

## 2020-05-26 DIAGNOSIS — Z9981 Dependence on supplemental oxygen: Principal | ICD-10-CM

## 2020-05-26 DIAGNOSIS — J84112 Idiopathic pulmonary fibrosis: Principal | ICD-10-CM

## 2020-05-26 DIAGNOSIS — T8119XA Other postprocedural shock, initial encounter: Principal | ICD-10-CM

## 2020-05-26 DIAGNOSIS — E1165 Type 2 diabetes mellitus with hyperglycemia: Principal | ICD-10-CM

## 2020-05-26 DIAGNOSIS — J9611 Chronic respiratory failure with hypoxia: Principal | ICD-10-CM

## 2020-05-26 DIAGNOSIS — Z7982 Long term (current) use of aspirin: Principal | ICD-10-CM

## 2020-05-26 DIAGNOSIS — J439 Emphysema, unspecified: Principal | ICD-10-CM

## 2020-05-26 DIAGNOSIS — Z7984 Long term (current) use of oral hypoglycemic drugs: Principal | ICD-10-CM

## 2020-05-26 DIAGNOSIS — G8918 Other acute postprocedural pain: Principal | ICD-10-CM

## 2020-05-26 DIAGNOSIS — T86812 Lung transplant infection: Principal | ICD-10-CM

## 2020-05-26 DIAGNOSIS — Z87891 Personal history of nicotine dependence: Principal | ICD-10-CM

## 2020-05-26 DIAGNOSIS — J9612 Chronic respiratory failure with hypercapnia: Principal | ICD-10-CM

## 2020-05-26 DIAGNOSIS — Z9049 Acquired absence of other specified parts of digestive tract: Principal | ICD-10-CM

## 2020-05-26 DIAGNOSIS — I272 Pulmonary hypertension, unspecified: Principal | ICD-10-CM

## 2020-05-26 DIAGNOSIS — Z20822 Contact with and (suspected) exposure to covid-19: Principal | ICD-10-CM

## 2020-05-26 DIAGNOSIS — Z85038 Personal history of other malignant neoplasm of large intestine: Principal | ICD-10-CM

## 2020-05-26 DIAGNOSIS — J849 Interstitial pulmonary disease, unspecified: Principal | ICD-10-CM

## 2020-05-26 DIAGNOSIS — Z8505 Personal history of malignant neoplasm of liver: Principal | ICD-10-CM

## 2020-05-26 DIAGNOSIS — Z942 Lung transplant status: Principal | ICD-10-CM

## 2020-05-26 DIAGNOSIS — D62 Acute posthemorrhagic anemia: Principal | ICD-10-CM

## 2020-05-26 DIAGNOSIS — K219 Gastro-esophageal reflux disease without esophagitis: Principal | ICD-10-CM

## 2020-05-26 DIAGNOSIS — E782 Mixed hyperlipidemia: Principal | ICD-10-CM

## 2020-05-26 DIAGNOSIS — Z9221 Personal history of antineoplastic chemotherapy: Principal | ICD-10-CM

## 2020-05-26 DIAGNOSIS — D72828 Other elevated white blood cell count: Principal | ICD-10-CM

## 2020-05-26 DIAGNOSIS — G4733 Obstructive sleep apnea (adult) (pediatric): Principal | ICD-10-CM

## 2020-05-26 DIAGNOSIS — D509 Iron deficiency anemia, unspecified: Principal | ICD-10-CM

## 2020-05-27 DIAGNOSIS — E11649 Type 2 diabetes mellitus with hypoglycemia without coma: Principal | ICD-10-CM

## 2020-05-27 DIAGNOSIS — J84112 Idiopathic pulmonary fibrosis: Principal | ICD-10-CM

## 2020-05-27 DIAGNOSIS — Z20822 Contact with and (suspected) exposure to covid-19: Principal | ICD-10-CM

## 2020-05-27 DIAGNOSIS — J439 Emphysema, unspecified: Principal | ICD-10-CM

## 2020-05-27 DIAGNOSIS — Z9049 Acquired absence of other specified parts of digestive tract: Principal | ICD-10-CM

## 2020-05-27 DIAGNOSIS — K219 Gastro-esophageal reflux disease without esophagitis: Principal | ICD-10-CM

## 2020-05-27 DIAGNOSIS — Z7984 Long term (current) use of oral hypoglycemic drugs: Principal | ICD-10-CM

## 2020-05-27 DIAGNOSIS — I10 Essential (primary) hypertension: Principal | ICD-10-CM

## 2020-05-27 DIAGNOSIS — I272 Pulmonary hypertension, unspecified: Principal | ICD-10-CM

## 2020-05-27 DIAGNOSIS — G4733 Obstructive sleep apnea (adult) (pediatric): Principal | ICD-10-CM

## 2020-05-27 DIAGNOSIS — D62 Acute posthemorrhagic anemia: Principal | ICD-10-CM

## 2020-05-27 DIAGNOSIS — J9612 Chronic respiratory failure with hypercapnia: Principal | ICD-10-CM

## 2020-05-27 DIAGNOSIS — J9611 Chronic respiratory failure with hypoxia: Principal | ICD-10-CM

## 2020-05-27 DIAGNOSIS — Z87891 Personal history of nicotine dependence: Principal | ICD-10-CM

## 2020-05-27 DIAGNOSIS — T8182XA Emphysema (subcutaneous) resulting from a procedure, initial encounter: Principal | ICD-10-CM

## 2020-05-27 DIAGNOSIS — E1165 Type 2 diabetes mellitus with hyperglycemia: Principal | ICD-10-CM

## 2020-05-27 DIAGNOSIS — Z9221 Personal history of antineoplastic chemotherapy: Principal | ICD-10-CM

## 2020-05-27 DIAGNOSIS — M1990 Unspecified osteoarthritis, unspecified site: Principal | ICD-10-CM

## 2020-05-27 DIAGNOSIS — D72828 Other elevated white blood cell count: Principal | ICD-10-CM

## 2020-05-27 DIAGNOSIS — E782 Mixed hyperlipidemia: Principal | ICD-10-CM

## 2020-05-27 DIAGNOSIS — D509 Iron deficiency anemia, unspecified: Principal | ICD-10-CM

## 2020-05-27 DIAGNOSIS — Z9981 Dependence on supplemental oxygen: Principal | ICD-10-CM

## 2020-05-27 DIAGNOSIS — Z85038 Personal history of other malignant neoplasm of large intestine: Principal | ICD-10-CM

## 2020-05-27 DIAGNOSIS — B371 Pulmonary candidiasis: Principal | ICD-10-CM

## 2020-05-27 DIAGNOSIS — Z8505 Personal history of malignant neoplasm of liver: Principal | ICD-10-CM

## 2020-05-27 DIAGNOSIS — T86812 Lung transplant infection: Principal | ICD-10-CM

## 2020-05-27 DIAGNOSIS — Z942 Lung transplant status: Principal | ICD-10-CM

## 2020-05-27 DIAGNOSIS — G8918 Other acute postprocedural pain: Principal | ICD-10-CM

## 2020-05-27 DIAGNOSIS — Z7982 Long term (current) use of aspirin: Principal | ICD-10-CM

## 2020-05-27 DIAGNOSIS — T8119XA Other postprocedural shock, initial encounter: Principal | ICD-10-CM

## 2020-05-27 MED ORDER — CALCIUM CARBONATE 600 MG CALCIUM (1,500 MG) TABLET
ORAL_TABLET | Freq: Two times a day (BID) | ORAL | 11 refills | 30.00000 days | Status: CP
Start: 2020-05-27 — End: ?
  Filled 2020-05-28: qty 100, 25d supply, fill #0
  Filled 2020-05-28: qty 60, 30d supply, fill #0

## 2020-05-27 MED ORDER — ONDANSETRON 4 MG DISINTEGRATING TABLET
ORAL_TABLET | Freq: Three times a day (TID) | ORAL | 0 refills | 10.00000 days | Status: CP | PRN
Start: 2020-05-27 — End: 2020-06-13
  Filled 2020-05-28: qty 18, 21d supply, fill #0

## 2020-05-27 MED ORDER — POLYETHYLENE GLYCOL 3350 17 GRAM/DOSE ORAL POWDER
Freq: Every day | ORAL | 0 refills | 30.00000 days | Status: CP | PRN
Start: 2020-05-27 — End: 2020-06-26
  Filled 2020-05-28: qty 510, 30d supply, fill #0

## 2020-05-27 MED ORDER — TACROLIMUS 0.5 MG CAPSULE, IMMEDIATE-RELEASE
ORAL_CAPSULE | ORAL | 11 refills | 30.00000 days | Status: CP
Start: 2020-05-27 — End: 2020-06-13
  Filled 2020-05-28: qty 90, 30d supply, fill #0

## 2020-05-27 MED ORDER — MYCOPHENOLATE MOFETIL 250 MG CAPSULE
ORAL_CAPSULE | Freq: Two times a day (BID) | ORAL | 11 refills | 30.00000 days | Status: CP
Start: 2020-05-27 — End: 2020-06-13
  Filled 2020-05-28: qty 180, 30d supply, fill #0

## 2020-05-27 MED ORDER — ACETAMINOPHEN 500 MG TABLET
ORAL_TABLET | Freq: Three times a day (TID) | ORAL | 2 refills | 30 days | Status: CP
Start: 2020-05-27 — End: 2020-06-11
  Filled 2020-05-28: qty 90, 30d supply, fill #0
  Filled 2020-05-28: qty 30, 5d supply, fill #0

## 2020-05-27 MED ORDER — ALBUTEROL SULFATE HFA 90 MCG/ACTUATION AEROSOL INHALER
RESPIRATORY_TRACT | 2 refills | 0 days | Status: CP | PRN
Start: 2020-05-27 — End: 2020-06-26
  Filled 2020-05-28: qty 8.5, 25d supply, fill #0

## 2020-05-27 MED ORDER — METFORMIN 1,000 MG TABLET
ORAL_TABLET | Freq: Two times a day (BID) | ORAL | 11 refills | 30.00000 days | Status: CP
Start: 2020-05-27 — End: ?

## 2020-05-27 MED ORDER — NOVOLIN N FLEXPEN 100 UNIT/ML (3 ML) SUBCUTANEOUS INSULIN PEN
Freq: Every day | SUBCUTANEOUS | 2 refills | 125 days | Status: CP
Start: 2020-05-27 — End: 2020-06-03
  Filled 2020-05-28: qty 15, 90d supply, fill #0
  Filled 2020-05-28: qty 15, 31d supply, fill #0

## 2020-05-27 MED ORDER — MAGNESIUM OXIDE 400 MG (241.3 MG MAGNESIUM) TABLET
ORAL_TABLET | Freq: Two times a day (BID) | ORAL | 11 refills | 30 days | Status: CP
Start: 2020-05-27 — End: 2021-05-27
  Filled 2020-05-28: qty 120, 30d supply, fill #0

## 2020-05-27 MED ORDER — TERAZOSIN 1 MG CAPSULE
ORAL_CAPSULE | Freq: Every evening | ORAL | 11 refills | 30 days | Status: CP
Start: 2020-05-27 — End: 2020-06-13
  Filled 2020-05-28: qty 30, 30d supply, fill #0

## 2020-05-27 MED ORDER — BLOOD-GLUCOSE METER
0 refills | 0 days | Status: CP
Start: 2020-05-27 — End: 2020-05-27

## 2020-05-27 MED ORDER — OSELTAMIVIR 75 MG CAPSULE
ORAL_CAPSULE | Freq: Every day | ORAL | 2 refills | 20 days | Status: CP
Start: 2020-05-27 — End: 2020-06-11
  Filled 2020-05-28: qty 20, 20d supply, fill #0

## 2020-05-27 MED ORDER — ACCU-CHEK GUIDE TEST STRIPS
11 refills | 0 days | Status: CP
Start: 2020-05-27 — End: 2020-05-27

## 2020-05-27 MED ORDER — POSACONAZOLE 100 MG TABLET,DELAYED RELEASE
ORAL_TABLET | Freq: Every day | ORAL | 0 refills | 7 days | Status: CP
Start: 2020-05-27 — End: 2020-06-03
  Filled 2020-05-28: qty 21, 7d supply, fill #0

## 2020-05-27 MED ORDER — AZITHROMYCIN 250 MG TABLET
ORAL_TABLET | Freq: Every day | ORAL | 11 refills | 30.00000 days | Status: CP
Start: 2020-05-27 — End: ?
  Filled 2020-05-28: qty 30, 30d supply, fill #0

## 2020-05-27 MED ORDER — INSULIN ASPART (U-100) 100 UNIT/ML (3 ML) SUBCUTANEOUS PEN
2 refills | 0 days | Status: CP
Start: 2020-05-27 — End: 2020-06-13

## 2020-05-27 MED ORDER — ON CALL EXPRESS TEST STRIP
11 refills | 0 days | Status: CP
Start: 2020-05-27 — End: ?
  Filled 2020-05-28: qty 100, 20d supply, fill #0

## 2020-05-27 MED ORDER — PANTOPRAZOLE 40 MG TABLET,DELAYED RELEASE
ORAL_TABLET | Freq: Two times a day (BID) | ORAL | 11 refills | 30.00000 days | Status: CP
Start: 2020-05-27 — End: ?
  Filled 2020-05-28: qty 60, 30d supply, fill #0

## 2020-05-27 MED ORDER — GABAPENTIN 100 MG CAPSULE
ORAL_CAPSULE | Freq: Three times a day (TID) | ORAL | 2 refills | 30.00000 days | Status: CP
Start: 2020-05-27 — End: ?
  Filled 2020-05-28: qty 90, 30d supply, fill #0

## 2020-05-27 MED ORDER — LANCETS 30 GAUGE
11 refills | 0 days | Status: CP
Start: 2020-05-27 — End: ?
  Filled 2020-05-28: qty 100, 20d supply, fill #0

## 2020-05-27 MED ORDER — CHOLECALCIFEROL (VITAMIN D3) 25 MCG (1,000 UNIT) TABLET
ORAL_TABLET | Freq: Every day | ORAL | 11 refills | 50 days | Status: CP
Start: 2020-05-27 — End: 2021-05-27
  Filled 2020-05-28: qty 100, 50d supply, fill #0

## 2020-05-27 MED ORDER — SENNOSIDES 8.6 MG-DOCUSATE SODIUM 50 MG TABLET
ORAL_TABLET | Freq: Two times a day (BID) | ORAL | 0 refills | 50 days | Status: CP | PRN
Start: 2020-05-27 — End: 2020-06-13
  Filled 2020-05-28: qty 100, 50d supply, fill #0

## 2020-05-28 DIAGNOSIS — G4733 Obstructive sleep apnea (adult) (pediatric): Principal | ICD-10-CM

## 2020-05-28 DIAGNOSIS — Z7982 Long term (current) use of aspirin: Principal | ICD-10-CM

## 2020-05-28 DIAGNOSIS — M1990 Unspecified osteoarthritis, unspecified site: Principal | ICD-10-CM

## 2020-05-28 DIAGNOSIS — K219 Gastro-esophageal reflux disease without esophagitis: Principal | ICD-10-CM

## 2020-05-28 DIAGNOSIS — I10 Essential (primary) hypertension: Principal | ICD-10-CM

## 2020-05-28 DIAGNOSIS — Z9049 Acquired absence of other specified parts of digestive tract: Principal | ICD-10-CM

## 2020-05-28 DIAGNOSIS — Z85038 Personal history of other malignant neoplasm of large intestine: Principal | ICD-10-CM

## 2020-05-28 DIAGNOSIS — Z87891 Personal history of nicotine dependence: Principal | ICD-10-CM

## 2020-05-28 DIAGNOSIS — I272 Pulmonary hypertension, unspecified: Principal | ICD-10-CM

## 2020-05-28 DIAGNOSIS — G8918 Other acute postprocedural pain: Principal | ICD-10-CM

## 2020-05-28 DIAGNOSIS — J9612 Chronic respiratory failure with hypercapnia: Principal | ICD-10-CM

## 2020-05-28 DIAGNOSIS — T8182XA Emphysema (subcutaneous) resulting from a procedure, initial encounter: Principal | ICD-10-CM

## 2020-05-28 DIAGNOSIS — J439 Emphysema, unspecified: Principal | ICD-10-CM

## 2020-05-28 DIAGNOSIS — J9611 Chronic respiratory failure with hypoxia: Principal | ICD-10-CM

## 2020-05-28 DIAGNOSIS — B371 Pulmonary candidiasis: Principal | ICD-10-CM

## 2020-05-28 DIAGNOSIS — Z9221 Personal history of antineoplastic chemotherapy: Principal | ICD-10-CM

## 2020-05-28 DIAGNOSIS — D62 Acute posthemorrhagic anemia: Principal | ICD-10-CM

## 2020-05-28 DIAGNOSIS — D72828 Other elevated white blood cell count: Principal | ICD-10-CM

## 2020-05-28 DIAGNOSIS — D509 Iron deficiency anemia, unspecified: Principal | ICD-10-CM

## 2020-05-28 DIAGNOSIS — Z9981 Dependence on supplemental oxygen: Principal | ICD-10-CM

## 2020-05-28 DIAGNOSIS — Z20822 Contact with and (suspected) exposure to covid-19: Principal | ICD-10-CM

## 2020-05-28 DIAGNOSIS — T8119XA Other postprocedural shock, initial encounter: Principal | ICD-10-CM

## 2020-05-28 DIAGNOSIS — Z7984 Long term (current) use of oral hypoglycemic drugs: Principal | ICD-10-CM

## 2020-05-28 DIAGNOSIS — E782 Mixed hyperlipidemia: Principal | ICD-10-CM

## 2020-05-28 DIAGNOSIS — J84112 Idiopathic pulmonary fibrosis: Principal | ICD-10-CM

## 2020-05-28 DIAGNOSIS — T86812 Lung transplant infection: Principal | ICD-10-CM

## 2020-05-28 DIAGNOSIS — E11649 Type 2 diabetes mellitus with hypoglycemia without coma: Principal | ICD-10-CM

## 2020-05-28 DIAGNOSIS — E1165 Type 2 diabetes mellitus with hyperglycemia: Principal | ICD-10-CM

## 2020-05-28 DIAGNOSIS — Z8505 Personal history of malignant neoplasm of liver: Principal | ICD-10-CM

## 2020-05-28 MED ORDER — VALGANCICLOVIR 450 MG TABLET
ORAL_TABLET | Freq: Every day | ORAL | 11 refills | 30.00000 days | Status: CP
Start: 2020-05-28 — End: ?
  Filled 2020-05-28: qty 60, 30d supply, fill #0

## 2020-05-28 MED ORDER — FUROSEMIDE 40 MG TABLET
ORAL_TABLET | Freq: Every day | ORAL | 2 refills | 30.00000 days | Status: CP
Start: 2020-05-28 — End: 2020-06-13
  Filled 2020-05-28: qty 30, 30d supply, fill #0

## 2020-05-28 MED ORDER — POTASSIUM, SODIUM PHOSPHATES 280 MG-160 MG-250 MG ORAL POWDER PACKET
PACK | Freq: Two times a day (BID) | ORAL | 2 refills | 30.00000 days | Status: CP
Start: 2020-05-28 — End: 2020-05-28

## 2020-05-28 MED ORDER — SULFAMETHOXAZOLE 400 MG-TRIMETHOPRIM 80 MG TABLET
ORAL_TABLET | ORAL | 11 refills | 28.00000 days | Status: CP
Start: 2020-05-28 — End: 2020-06-11
  Filled 2020-05-28: qty 12, 28d supply, fill #0

## 2020-05-28 MED ORDER — OXYCODONE 5 MG TABLET
ORAL_TABLET | ORAL | 0 refills | 5.00000 days | Status: CP | PRN
Start: 2020-05-28 — End: 2020-06-02

## 2020-05-28 MED ORDER — PREDNISONE 5 MG TABLET
ORAL_TABLET | Freq: Every day | ORAL | 11 refills | 30.00000 days | Status: CP
Start: 2020-05-28 — End: ?
  Filled 2020-05-28: qty 120, 30d supply, fill #0

## 2020-05-28 MED ORDER — PRAVASTATIN 40 MG TABLET
ORAL_TABLET | Freq: Every day | ORAL | 11 refills | 30.00000 days | Status: CP
Start: 2020-05-28 — End: ?
  Filled 2020-05-28: qty 30, 30d supply, fill #0

## 2020-05-29 DIAGNOSIS — Z942 Lung transplant status: Principal | ICD-10-CM

## 2020-06-03 ENCOUNTER — Encounter: Admit: 2020-06-03 | Discharge: 2020-06-04 | Payer: MEDICARE

## 2020-06-03 ENCOUNTER — Encounter: Admit: 2020-06-03 | Discharge: 2020-06-04 | Payer: MEDICARE | Attending: Family | Primary: Family

## 2020-06-03 DIAGNOSIS — J439 Emphysema, unspecified: Principal | ICD-10-CM

## 2020-06-03 DIAGNOSIS — Z4824 Encounter for aftercare following lung transplant: Principal | ICD-10-CM

## 2020-06-03 DIAGNOSIS — I10 Essential (primary) hypertension: Principal | ICD-10-CM

## 2020-06-03 DIAGNOSIS — Z8505 Personal history of malignant neoplasm of liver: Principal | ICD-10-CM

## 2020-06-03 DIAGNOSIS — Z85038 Personal history of other malignant neoplasm of large intestine: Principal | ICD-10-CM

## 2020-06-03 DIAGNOSIS — E1165 Type 2 diabetes mellitus with hyperglycemia: Principal | ICD-10-CM

## 2020-06-03 DIAGNOSIS — Z79899 Other long term (current) drug therapy: Principal | ICD-10-CM

## 2020-06-03 DIAGNOSIS — D509 Iron deficiency anemia, unspecified: Principal | ICD-10-CM

## 2020-06-03 DIAGNOSIS — R197 Diarrhea, unspecified: Principal | ICD-10-CM

## 2020-06-03 DIAGNOSIS — Z7984 Long term (current) use of oral hypoglycemic drugs: Principal | ICD-10-CM

## 2020-06-03 DIAGNOSIS — J90 Pleural effusion, not elsewhere classified: Principal | ICD-10-CM

## 2020-06-03 DIAGNOSIS — N401 Enlarged prostate with lower urinary tract symptoms: Principal | ICD-10-CM

## 2020-06-03 DIAGNOSIS — Z794 Long term (current) use of insulin: Principal | ICD-10-CM

## 2020-06-03 DIAGNOSIS — E119 Type 2 diabetes mellitus without complications: Principal | ICD-10-CM

## 2020-06-03 DIAGNOSIS — R609 Edema, unspecified: Principal | ICD-10-CM

## 2020-06-03 DIAGNOSIS — Z942 Lung transplant status: Principal | ICD-10-CM

## 2020-06-03 DIAGNOSIS — B449 Aspergillosis, unspecified: Principal | ICD-10-CM

## 2020-06-03 DIAGNOSIS — R3914 Feeling of incomplete bladder emptying: Principal | ICD-10-CM

## 2020-06-03 MED ORDER — INSULIN GLARGINE (U-100) 100 UNIT/ML (3 ML) SUBCUTANEOUS PEN
Freq: Every evening | SUBCUTANEOUS | 3 refills | 107 days | Status: CP
Start: 2020-06-03 — End: 2020-06-13
  Filled 2020-06-03: qty 15, 90d supply, fill #0

## 2020-06-03 MED ORDER — POSACONAZOLE 100 MG TABLET,DELAYED RELEASE
ORAL_TABLET | Freq: Every day | ORAL | 3 refills | 7.00000 days | Status: CP
Start: 2020-06-03 — End: 2020-06-13
  Filled 2020-06-03: qty 21, 7d supply, fill #0

## 2020-06-04 ENCOUNTER — Encounter: Admit: 2020-06-04 | Discharge: 2020-06-05 | Payer: MEDICARE

## 2020-06-04 ENCOUNTER — Encounter
Admit: 2020-06-04 | Discharge: 2020-07-03 | Payer: MEDICARE | Attending: Rehabilitative and Restorative Service Providers" | Primary: Rehabilitative and Restorative Service Providers"

## 2020-06-04 DIAGNOSIS — R197 Diarrhea, unspecified: Principal | ICD-10-CM

## 2020-06-04 DIAGNOSIS — Z942 Lung transplant status: Principal | ICD-10-CM

## 2020-06-04 DIAGNOSIS — R5381 Other malaise: Principal | ICD-10-CM

## 2020-06-04 DIAGNOSIS — E119 Type 2 diabetes mellitus without complications: Principal | ICD-10-CM

## 2020-06-05 ENCOUNTER — Encounter
Admit: 2020-06-05 | Discharge: 2020-06-07 | Payer: MEDICARE | Attending: Rehabilitative and Restorative Service Providers" | Primary: Rehabilitative and Restorative Service Providers"

## 2020-06-05 DIAGNOSIS — Z7409 Other reduced mobility: Principal | ICD-10-CM

## 2020-06-05 DIAGNOSIS — Z942 Lung transplant status: Principal | ICD-10-CM

## 2020-06-05 DIAGNOSIS — E119 Type 2 diabetes mellitus without complications: Principal | ICD-10-CM

## 2020-06-05 DIAGNOSIS — R0602 Shortness of breath: Principal | ICD-10-CM

## 2020-06-05 DIAGNOSIS — R5381 Other malaise: Principal | ICD-10-CM

## 2020-06-05 DIAGNOSIS — Z01818 Encounter for other preprocedural examination: Principal | ICD-10-CM

## 2020-06-10 ENCOUNTER — Ambulatory Visit
Admit: 2020-06-10 | Discharge: 2020-07-07 | Payer: MEDICARE | Attending: Rehabilitative and Restorative Service Providers" | Primary: Rehabilitative and Restorative Service Providers"

## 2020-06-10 ENCOUNTER — Encounter
Admit: 2020-06-10 | Discharge: 2020-07-07 | Payer: MEDICARE | Attending: Rehabilitative and Restorative Service Providers" | Primary: Rehabilitative and Restorative Service Providers"

## 2020-06-10 ENCOUNTER — Encounter: Admit: 2020-06-10 | Discharge: 2020-07-07 | Payer: MEDICARE

## 2020-06-10 ENCOUNTER — Ambulatory Visit: Admit: 2020-06-10 | Discharge: 2020-06-11 | Payer: MEDICARE

## 2020-06-10 ENCOUNTER — Non-Acute Institutional Stay
Admit: 2020-06-10 | Discharge: 2020-07-07 | Payer: MEDICARE | Attending: Rehabilitative and Restorative Service Providers" | Primary: Rehabilitative and Restorative Service Providers"

## 2020-06-10 DIAGNOSIS — E119 Type 2 diabetes mellitus without complications: Principal | ICD-10-CM

## 2020-06-10 DIAGNOSIS — Z942 Lung transplant status: Principal | ICD-10-CM

## 2020-06-10 DIAGNOSIS — Z01818 Encounter for other preprocedural examination: Principal | ICD-10-CM

## 2020-06-10 DIAGNOSIS — Z7409 Other reduced mobility: Principal | ICD-10-CM

## 2020-06-10 DIAGNOSIS — R5381 Other malaise: Principal | ICD-10-CM

## 2020-06-10 DIAGNOSIS — R0602 Shortness of breath: Principal | ICD-10-CM

## 2020-06-11 DIAGNOSIS — Z01818 Encounter for other preprocedural examination: Principal | ICD-10-CM

## 2020-06-11 DIAGNOSIS — Z298 Encounter for other specified prophylactic measures: Principal | ICD-10-CM

## 2020-06-11 DIAGNOSIS — Z7409 Other reduced mobility: Principal | ICD-10-CM

## 2020-06-11 DIAGNOSIS — Z942 Lung transplant status: Principal | ICD-10-CM

## 2020-06-11 DIAGNOSIS — R5381 Other malaise: Principal | ICD-10-CM

## 2020-06-11 DIAGNOSIS — R0602 Shortness of breath: Principal | ICD-10-CM

## 2020-06-11 MED ORDER — PEN NEEDLE, DIABETIC 32 GAUGE X 5/32" (4 MM)
Freq: Three times a day (TID) | SUBCUTANEOUS | 2 refills | 67 days | Status: CP
Start: 2020-06-11 — End: ?
  Filled 2020-06-19: qty 200, 67d supply, fill #0

## 2020-06-11 MED ORDER — ACETAMINOPHEN 500 MG TABLET
ORAL_TABLET | Freq: Three times a day (TID) | ORAL | 0 refills | 30.00000 days | Status: CP
Start: 2020-06-11 — End: 2020-07-19
  Filled 2020-06-19: qty 180, 30d supply, fill #0

## 2020-06-11 MED ORDER — SULFAMETHOXAZOLE 400 MG-TRIMETHOPRIM 80 MG TABLET
ORAL_TABLET | ORAL | 3 refills | 84.00000 days | Status: CP
Start: 2020-06-11 — End: 2020-09-09
  Filled 2020-06-19: qty 36, 84d supply, fill #0

## 2020-06-11 MED ORDER — OSELTAMIVIR 75 MG CAPSULE
ORAL_CAPSULE | Freq: Every day | ORAL | 2 refills | 20 days | Status: CP
Start: 2020-06-11 — End: ?
  Filled 2020-06-23: qty 20, 20d supply, fill #0

## 2020-06-13 ENCOUNTER — Ambulatory Visit: Admit: 2020-06-13 | Discharge: 2020-06-14 | Payer: MEDICARE

## 2020-06-13 ENCOUNTER — Encounter: Admit: 2020-06-13 | Discharge: 2020-06-14 | Payer: MEDICARE | Attending: Family | Primary: Family

## 2020-06-13 ENCOUNTER — Encounter: Admit: 2020-06-13 | Discharge: 2020-06-14 | Payer: MEDICARE

## 2020-06-13 DIAGNOSIS — I10 Essential (primary) hypertension: Principal | ICD-10-CM

## 2020-06-13 DIAGNOSIS — Z79899 Other long term (current) drug therapy: Principal | ICD-10-CM

## 2020-06-13 DIAGNOSIS — J439 Emphysema, unspecified: Principal | ICD-10-CM

## 2020-06-13 DIAGNOSIS — Z9221 Personal history of antineoplastic chemotherapy: Principal | ICD-10-CM

## 2020-06-13 DIAGNOSIS — J90 Pleural effusion, not elsewhere classified: Principal | ICD-10-CM

## 2020-06-13 DIAGNOSIS — R197 Diarrhea, unspecified: Principal | ICD-10-CM

## 2020-06-13 DIAGNOSIS — Z7984 Long term (current) use of oral hypoglycemic drugs: Principal | ICD-10-CM

## 2020-06-13 DIAGNOSIS — Z85038 Personal history of other malignant neoplasm of large intestine: Principal | ICD-10-CM

## 2020-06-13 DIAGNOSIS — N400 Enlarged prostate without lower urinary tract symptoms: Principal | ICD-10-CM

## 2020-06-13 DIAGNOSIS — E1165 Type 2 diabetes mellitus with hyperglycemia: Principal | ICD-10-CM

## 2020-06-13 DIAGNOSIS — Z4824 Encounter for aftercare following lung transplant: Principal | ICD-10-CM

## 2020-06-13 DIAGNOSIS — K219 Gastro-esophageal reflux disease without esophagitis: Principal | ICD-10-CM

## 2020-06-13 DIAGNOSIS — Z794 Long term (current) use of insulin: Principal | ICD-10-CM

## 2020-06-13 DIAGNOSIS — E119 Type 2 diabetes mellitus without complications: Principal | ICD-10-CM

## 2020-06-13 DIAGNOSIS — Z942 Lung transplant status: Principal | ICD-10-CM

## 2020-06-13 DIAGNOSIS — D509 Iron deficiency anemia, unspecified: Principal | ICD-10-CM

## 2020-06-13 DIAGNOSIS — J9811 Atelectasis: Principal | ICD-10-CM

## 2020-06-13 DIAGNOSIS — B449 Aspergillosis, unspecified: Principal | ICD-10-CM

## 2020-06-13 MED ORDER — INSULIN GLARGINE (U-100) 100 UNIT/ML (3 ML) SUBCUTANEOUS PEN
Freq: Every evening | SUBCUTANEOUS | 3 refills | 88 days | Status: CP
Start: 2020-06-13 — End: 2020-06-24

## 2020-06-13 MED ORDER — TERAZOSIN 1 MG CAPSULE
ORAL_CAPSULE | Freq: Every evening | ORAL | 11 refills | 30 days | Status: CP
Start: 2020-06-13 — End: ?
  Filled 2020-06-13: qty 60, 30d supply, fill #0

## 2020-06-13 MED ORDER — AZATHIOPRINE 50 MG TABLET
ORAL_TABLET | Freq: Every day | ORAL | 11 refills | 30.00000 days | Status: CP
Start: 2020-06-13 — End: 2021-06-13
  Filled 2020-06-13: qty 60, 30d supply, fill #0

## 2020-06-13 MED ORDER — POSACONAZOLE 100 MG TABLET,DELAYED RELEASE
ORAL_TABLET | Freq: Every day | ORAL | 3 refills | 7 days
Start: 2020-06-13 — End: ?

## 2020-06-13 MED ORDER — TACROLIMUS 0.5 MG CAPSULE, IMMEDIATE-RELEASE
ORAL_CAPSULE | Freq: Two times a day (BID) | ORAL | 3 refills | 90.00000 days | Status: CP
Start: 2020-06-13 — End: 2020-06-17

## 2020-06-13 MED ORDER — INSULIN ASPART (U-100) 100 UNIT/ML (3 ML) SUBCUTANEOUS PEN
2 refills | 0 days | Status: CP
Start: 2020-06-13 — End: 2020-06-24

## 2020-06-17 DIAGNOSIS — E119 Type 2 diabetes mellitus without complications: Principal | ICD-10-CM

## 2020-06-17 DIAGNOSIS — R0602 Shortness of breath: Principal | ICD-10-CM

## 2020-06-17 DIAGNOSIS — Z942 Lung transplant status: Principal | ICD-10-CM

## 2020-06-17 DIAGNOSIS — R5381 Other malaise: Principal | ICD-10-CM

## 2020-06-17 DIAGNOSIS — Z01818 Encounter for other preprocedural examination: Principal | ICD-10-CM

## 2020-06-17 DIAGNOSIS — Z7409 Other reduced mobility: Principal | ICD-10-CM

## 2020-06-17 MED ORDER — TACROLIMUS 0.5 MG CAPSULE, IMMEDIATE-RELEASE
ORAL_CAPSULE | Freq: Two times a day (BID) | ORAL | 3 refills | 90 days | Status: CP
Start: 2020-06-17 — End: 2021-06-17

## 2020-06-18 ENCOUNTER — Ambulatory Visit: Admit: 2020-06-18 | Discharge: 2020-06-19 | Payer: MEDICARE

## 2020-06-18 DIAGNOSIS — Z01818 Encounter for other preprocedural examination: Principal | ICD-10-CM

## 2020-06-18 DIAGNOSIS — E785 Hyperlipidemia, unspecified: Principal | ICD-10-CM

## 2020-06-18 DIAGNOSIS — E119 Type 2 diabetes mellitus without complications: Principal | ICD-10-CM

## 2020-06-18 DIAGNOSIS — Z942 Lung transplant status: Principal | ICD-10-CM

## 2020-06-18 DIAGNOSIS — J849 Interstitial pulmonary disease, unspecified: Principal | ICD-10-CM

## 2020-06-18 DIAGNOSIS — K219 Gastro-esophageal reflux disease without esophagitis: Principal | ICD-10-CM

## 2020-06-18 DIAGNOSIS — Z794 Long term (current) use of insulin: Principal | ICD-10-CM

## 2020-06-18 DIAGNOSIS — K2970 Gastritis, unspecified, without bleeding: Principal | ICD-10-CM

## 2020-06-18 DIAGNOSIS — J439 Emphysema, unspecified: Principal | ICD-10-CM

## 2020-06-18 DIAGNOSIS — R5381 Other malaise: Principal | ICD-10-CM

## 2020-06-18 DIAGNOSIS — E109 Type 1 diabetes mellitus without complications: Principal | ICD-10-CM

## 2020-06-18 DIAGNOSIS — K3189 Other diseases of stomach and duodenum: Principal | ICD-10-CM

## 2020-06-18 DIAGNOSIS — Z7409 Other reduced mobility: Principal | ICD-10-CM

## 2020-06-18 DIAGNOSIS — I10 Essential (primary) hypertension: Principal | ICD-10-CM

## 2020-06-18 DIAGNOSIS — R0602 Shortness of breath: Principal | ICD-10-CM

## 2020-06-18 DIAGNOSIS — Z79899 Other long term (current) drug therapy: Principal | ICD-10-CM

## 2020-06-19 DIAGNOSIS — Z7409 Other reduced mobility: Principal | ICD-10-CM

## 2020-06-19 DIAGNOSIS — R5381 Other malaise: Principal | ICD-10-CM

## 2020-06-19 DIAGNOSIS — Z942 Lung transplant status: Principal | ICD-10-CM

## 2020-06-19 DIAGNOSIS — Z01818 Encounter for other preprocedural examination: Principal | ICD-10-CM

## 2020-06-19 DIAGNOSIS — E119 Type 2 diabetes mellitus without complications: Principal | ICD-10-CM

## 2020-06-19 DIAGNOSIS — R0602 Shortness of breath: Principal | ICD-10-CM

## 2020-06-19 NOTE — Unmapped (Signed)
[facility] Specialty Medication Onboarding    Specialty Medication: Valganciclovir 450mg  tablet  Prior Authorization: Not Required   Financial Assistance: No - copay  <$25  Final Copay/Day Supply: $5 / 30 days    Insurance Restrictions: None     Notes to Pharmacist: N/A    The triage team has completed the benefits investigation and has determined that the patient is able to fill this medication at [facility]. Please contact the patient to complete the onboarding or follow up with the prescribing physician as needed.

## 2020-06-19 NOTE — Unmapped (Signed)
Pulmonary Transplant Clinic    HISTORY:   HPI:  70 y.o. 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, also with type 2 diabetes mellitus, hypertension, colon cancer in 2007 s/p partial colon and hepatic wedge resection and FOLFOX here for f/u:    - Feeling better overall  - Feels weak and run down after meds for a few hours and then gets some energy back.  - Denies cough, SOB, sputum production, fevers, chills  - Does endorse some runny nose, he has always had this.  - Still feeling some popping of sternum, no pain.  - Doesn't have home spirometer yet.  - Last visit we switched from MMF to imuran  - Diarrhea and GI symptoms have been improved for at least a few days  - Weight is still dropping but not as much, he has lost 1lb since last week, up and down now as opposed to steadily dropping.  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     --> basiliximab induction    --> CMV high risk (D+/R-)    --> EBV moderate risk (D+/R+)    --> Not Increased Risk Donor  - Colon Cancer 2007    --> s/p resection of liver mets    --> s/p resection of 6 inches of colon    --> FOLFOX Chemo  - type 2 diabetes mellitus     --> not insulin dependent  - OA    SOCIAL HISTORY:   - 20pk years, quit in 1991  - drinks about 10 drinks a week but will stop  - Works maintenance at [employer]  - Married and lives in [place]    FAMILY HISTORY:   - Parents lived into late 90's    MEDS:  (personally reviewed in EPIC, pertinent meds noted below)     OBJECTIVE DATA:   PHYSICAL EXAM:  BP 123/66  - Pulse 114  - Temp 36.4 ??C (97.6 ??F) (Tympanic)  - Ht 170.2 cm (5' 7.01)  - Wt 70.8 kg (156 lb)  - SpO2 97%  - BMI 24.43 kg/m?? Gen: - awake, alert, in NAD   Derm: - no rash   HEENT: - no adenopathy, TMs clear   Vascular: - good pulses throughout, no JVP   CV: - RRR, no murmers or gallops   Pulm: - CTA bilaterally   Incision: - CDI, OTA  Abd: - soft, NT, ND, +BS, no hepatosplenomegally   Ext: - no edema, no clubbing.  Mildly tented skin turgor  Joints: - no enlarged joints, no warmth, redness or effusions   Neuro: - CN grossly intact, nl gait     LABS: (reviewd in Epic, pertinent values noted below)     Lab Results   Component Value Date    WBC 9.1 06/20/2020    HGB 14.3 06/20/2020    HCT 46.2 06/20/2020    PLT 229 06/20/2020       Lab Results   Component Value Date    NA 134 (L) 06/20/2020    K 4.5 06/20/2020    CL 100 06/20/2020    CO2 26.0 06/20/2020  BUN 37 (H) 06/20/2020    CREATININE 1.38 (H) 06/20/2020    GLU 139 (H) 06/20/2020    CALCIUM 10.1 06/20/2020    MG 1.7 06/20/2020    PHOS 2.9 06/20/2020       Lab Results   Component Value Date    BILITOT 0.4 06/20/2020    BILIDIR 0.10 05/28/2020    PROT 7.0 06/20/2020    ALBUMIN 3.7 06/20/2020    ALT 27 06/20/2020    AST 22 06/20/2020    ALKPHOS 88 06/20/2020    GGT 19 05/15/2020       Lab Results   Component Value Date    PT 13.4 05/17/2020    INR 1.15 05/17/2020    APTT 23.9 (L) 05/15/2020         PFTs:  (personally reveiewed and interpreted):  Date: FVC (% Pred) FEV1 (% Pred) FEF25-75(% Pred) DLCO TBBx/Results                           06/20/20 3.39 (91.4%) 2.82 (99.2%) 3.34 (150.35)     06/13/20 3.10 (72.8%) 2.66 (82.5%) 3.34 (136.2%)      05/15/20      BOLT    03/11/20  2.75 (72%)  2.36 (81%)       02/12/20  2.85 (75%)  2.44 (86%)             IMAGING: (Personally reviewed, our interpretation is below)  Small right and trace left pleural effusions with adjacent compressive atelectasis. No definite focal consolidation.    ASSESSMENT and PLAN   70 y.o. 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, also with type 2 diabetes mellitus, hypertension, colon cancer in 2007 s/p partial colon and hepatic wedge resection and FOLFOX here for his first discharge f/u:    Loose stools:  - Improved with switch from MMF to imuran  - No associated urgency or stomach cramping/pain.  - Likely medication related.  - GI pathogen panel negative, Cdiff negative.    Graft Function and Immunosuppression:  - PFTs climbing, CXR with persistent R sided effusion, slightly enlarged, effusion to be evaluated by IP, patient feels clinically fatigued.  - Due for biopsy on 06/15/20, will hold for age and good PFTs.  - s/p IVIg on 01/08 and 01/18   - HLA status: No DSAs  - s/p basiliximab induction  - Tacro dose: 1/0.5, Goal: 8-10 (lower for age)  - Cellcept Dose: 750mg  BID (lower for age), stopped MMF 02/04 (GI upset) and switched to imuran 100mg  daily.  - Prednisone Dose: 20mg  daily  - Azithro and prava for BOS ppx    Antimicrobial Prophylaxis:  - CMV, high risk (D+/R-): valganciclovir 900mg  daily for Estimated Creatinine Clearance: 45.3 mL/min (A) (based on SCr of 1.44 mg/dL (H)).  - EBV, (D+/R+): monitor weekly   - Fungal: Posaconazole 300mg  daily, last level 1715 (05/21/20)  - PCP: Bactrim SS MWF   - tamiflu during flu season    Pain control:  - Moderately well controlled  - Continue gabapentin 100mg  TID  - Continue scheduled tylenol    Possible OSA:   - Noted to have desaturations to 88% overnight. Reportedly snores.  - Will need outpatient overnight oximetry to evaluate  ??  Possible COVID exposure:   - Known COVID+ exposure 1/14.  - COVID test obtained 1/19, will f/u to see if needs any therapies    GERD studies  - Continue protonix??  BPH  - Increased terazosin to 2mg  nightly to help symptoms and BP    Blood Pressure Management:  - Home BPs stable  - Not currently on therapy    Kidney Function:  - Cr 0.8-1.13  - Cr improved to 1.3  - Lasix on hold, no increase in LE edema    Iron Deficiency Anemia:   - received IV iron 05/22/20  ??  Antiarrythmic Prophylaxis:   - finished amiodarone without arrhythmia. Type 2 diabetes mellitus   - metformin + insulin (NPH 12 units and SS)  - Estimated Creatinine Clearance: 45.3 mL/min (A) (based on SCr of 1.44 mg/dL (H)).  - Blood sugars remain elevated  - Continue AM lantus, standing short acting with meals + SSI, appreciate pharmacy recs adjusting insulin today.  - Advised to add protein and limit carbs.      Bone Health: Last Dexa: Oct 2021, no osteopenia or osteoporosis  ??    Health Maintenance:  - Last derm visit:  Pre-txp  - Last Colonsocopy (if >40): 12/2016 Diverticulosis and internal hemorrhoids  - Last flu shot:   02/08/2020  - Last Pneumovax:  06/10/2004, 12/30/2017  - Last Prevnar:  03/25/2016  - TdaP:   02/11/2018  - Shingrix:    03/10/20, Due #2  - COVID-19 Gala Murdoch) 06/11/19, 07/09/19, 03/10/20, will be due 08/09/19, scheduled for [place].  - Last Dexa:   02/2020  - Last vitamin D:  27 (05/24/2020)  - Last HbA1c:   6.5 (05/15/2020)    Patient will return to clinic in 1 week    >50min was spent with the patient face to face and >50min was spent reviewing chart/imaging.     Complex medical decision making was done as we adjust medications based on blood work/drug levels and multiple complaints and problems were addressed    The patient was assessed and discussed with Dr. Glenard Haring who is in agreement with plan.      Sharee Pimple MSN, FNP-C  June 20, 2020 10:24 AM  Lung Transplant Nurse Practitioner  Pager [phone]

## 2020-06-19 NOTE — Unmapped (Signed)
[facility]   Patient Onboarding/Medication Counseling    [name] is a 70 y.o. 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  I am speaking to the patient.    Was a Nurse, learning disability used for this call? No    Verified patient's date of birth / HIPAA.    Specialty medication(s) to be sent: Transplant: Patient declined refills today but will follow up with him at the beginning of next week.      Non-specialty medications/supplies to be sent: Pen Needles, smz-tmp, apap      Medications not needed at this time: none       The patient declined counseling on missed dose instructions, goals of therapy, side effects and monitoring parameters, warnings and precautions, drug/food interactions and storage, handling precautions, and disposal because they have taken the medication previously. The information in the declined sections below are for informational purposes only and was not discussed with patient.     Valcyte (valganciclovir)    Medication & Administration     Dosage:   ??? Take 2 tablets (900mg  total) once a day.    Administration:   ??? Take with food  ??? Swallow the pills whole, do not break, crush, or chew    Adherence/Missed dose instructions:  ??? Take a missed dose as soon as you think about it with food  ??? If it is close to your next dose, skip the missed dose and go back to your normal time.  ??? Do not take 2 doses at the same time or extra doses.  ??? Report any missed doses to coordinator    Goals of Therapy     ??? To prevent or treat CMV infection in setting of solid organ transplant    Side Effects & Monitoring Parameters   ??? Common side effects  ??? Headache  ??? Diarrhea or constipation  ??? Appetite or sleep disturbances  ??? Back, muscle, joint, or belly pain  ??? Weight loss  ??? Dizziness  ??? Muscle spasm  ??? Upset stomach or vomiting    ??? The following side effects should be reported to the provider:  ??? Allergic reaction  (rash, hives, swelling, blistered or peeling skin, shortness of breath)  ??? Infection (fever, chills, sore throat, ear/sinus pain, cough, sputum change, urinary pain, mouth sores, non-healing wounds)  ??? Bleeding (cough ground vomit, blood in urine, black/red/tarry stools, unexplained bruising or bleeding)  ??? Electrolyte problems (mood changes, confusion, weakness, abnormal heartbeat, seizures)  ??? Kidney problems (urine changes, weight gain)  ??? Yellowing skin or eyes  ??? Swelling in arms, legs, stomach  ??? Severe dizziness or passing out  ??? Eye issues (eyesight changes, pain, or irritation)  ??? Night sweats    ??? Monitoring parameters  ??? Have eye exam as directed by doctor  ??? CMV counts  ??? CBC  ??? Renal function  ??? Pregnancy test prior to initiation    Contraindications, Warnings, & Precautions   ??? BBW: severe leukopenia, neutropenia, anemia, thrombocytopenia, pancytopenia, and bone marrow failure, including aplastic anemia have been reported  ??? BBW: may cause temporary or permanent inhibition of spermatogenesis and suppression of fertilty; has the potential to cause birth defects and cancers in humans  ??? Male patients should have pregnancy test prior to initiation and use birth control for at least 30 days after discontinuation  ??? Male patients should use a barrier contraceptive while on therapy and for 90 days after discontinuation  ???  Acute renal failure  ??? Not indicated for use in liver transplant recipients  ??? Breastfeeding is not recommended    Drug/Food Interactions   ??? Medication list reviewed in Epic. The patient was instructed to inform the care team before taking any new medications or supplements. No drug interactions identified.   ??? Check with your doctor before getting any vaccinations (live or inactivated)    Storage, Handling Precautions, & Disposal   ??? Store at room temperature  ??? Keep away from children and pets      Current Medications (including OTC/herbals), Comorbidities and Allergies     Current Outpatient Medications   Medication Sig Dispense Refill   ??? acetaminophen (TYLENOL EXTRA STRENGTH) 500 MG tablet Take 2 tablets (1,000 mg total) by mouth Three (3) times a day. 180 tablet 0   ??? albuterol HFA 90 mcg/actuation inhaler Inhale 2 puffs every four (4) hours as needed for wheezing or shortness of breath. 8.5 g 2   ??? azaTHIOprine (IMURAN) 50 mg tablet Take 2 tablets (100 mg total) by mouth daily. 60 tablet 11   ??? azithromycin (ZITHROMAX) 250 MG tablet Take 1 tablet (250 mg total) by mouth daily. 30 tablet 11   ??? blood sugar diagnostic (ON CALL EXPRESS TEST STRIP) Strp Test daily before all meals/snacks and once before bedtime. 100 each 11   ??? calcium carbonate (CALCIUM 600) 1,500 mg (600 mg elem calcium) tablet Take 1 tablet (600 mg of elem calcium total) by mouth Two (2) times a day. 60 tablet 11   ??? cholecalciferol, vitamin D3 25 mcg, 1,000 units,, 1,000 unit (25 mcg) tablet Take 2 tablets (50 mcg total) by mouth daily. 100 tablet 11   ??? gabapentin (NEURONTIN) 100 MG capsule Take 1 capsule (100 mg total) by mouth Three (3) times a day. 90 capsule 2   ??? insulin ASPART (NOVOLOG FLEXPEN U-100 INSULIN) 100 unit/mL (3 mL) injection pen Inject 6 units with breakfast, 6 units with afternoon meal, and 4 units with evening snack plus sliding scale 2 units for every 50 >150; max 25 units/day 15 mL 2   ??? insulin glargine (BASAGLAR, LANTUS) 100 unit/mL (3 mL) injection pen Inject 0.17 mL (17 Units total) under the skin nightly. 15 mL 3   ??? lancets 30 gauge Misc Test daily before all meals/snacks and once before bedtime. 100 each 11   ??? magnesium oxide (MAG-OX) 400 mg (241.3 mg elemental magnesium) tablet Take 2 tablets (800 mg total) by mouth Two (2) times a day. 120 tablet 11   ??? metFORMIN (GLUCOPHAGE) 1000 MG tablet Take 1 tablet (1,000 mg total) by mouth Two (2) times a day. 60 tablet 11   ??? oseltamivir (TAMIFLU) 75 MG capsule Take 1 capsule (75 mg total) by mouth daily. 20 capsule 2   ??? pantoprazole (PROTONIX) 40 MG tablet Take 1 tablet (40 mg total) by mouth Two (2) times a day. 60 tablet 11   ??? pen needle, diabetic (BD ULTRA-FINE NANO PEN NEEDLE) 32 gauge x 5/32 (4 mm) Ndle Inject 1 each under the skin Three (3) times a day. 200 each 2   ??? posaconazole (NOXAFIL) 100 mg delayed released tablet Take 3 tablets (300 mg total) by mouth daily. 21 tablet 3   ??? pravastatin (PRAVACHOL) 40 MG tablet Take 1 tablet (40 mg total) by mouth daily. 30 tablet 11   ??? predniSONE (DELTASONE) 5 MG tablet Take 4 tablets (20 mg total) by mouth daily. 120 tablet 11   ??? sulfamethoxazole-trimethoprim (  BACTRIM) 400-80 mg per tablet Take 1 tablet (80 mg of trimethoprim total) by mouth Every Monday, Wednesday, and Friday. 36 tablet 3   ??? tacrolimus (PROGRAF) 0.5 MG capsule Take 1 capsule (0.5 mg total) by mouth two (2) times a day. 180 capsule 3   ??? terazosin (HYTRIN) 1 MG capsule Take 2 capsules (2 mg total) by mouth nightly. 60 capsule 11   ??? valGANciclovir (VALCYTE) 450 mg tablet Take 2 tablets (900 mg total) by mouth daily. 60 tablet 11     No current facility-administered medications for this visit.       Allergies   Allergen Reactions   ??? Cetuximab Anaphylaxis     Chest pain/rapid heart rate/ BP dropped   ??? Nsaids (Non-Steroidal Anti-Inflammatory Drug)      Interaction with transplant medications   ??? Venom-Honey Bee Anaphylaxis   ??? Enalapril      Angioedema     ??? Pollen Extracts        Patient Active Problem List   Diagnosis   ??? Malignant neoplasm of colon (CMS-HCC)   ??? Mixed hyperlipidemia   ??? Cataract   ??? Type 2 diabetes mellitus without complication (CMS-HCC)   ??? Colon cancer high risk   ??? Hypertension   ??? 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 Hip pain   ??? Lumbar radiculopathy   ??? Spinal stenosis   ??? Chronic pain   ??? Edema   ??? Routine general medical examination at a health care facility   ??? Other acute sinusitis   ??? Hordeolum externum of left upper eyelid   ??? Abnormal CXR   ??? Cataract, nuclear sclerotic, right eye   ??? Angioedema due to angiotensin converting enzyme inhibitor (ACE-I)   ??? Oral aphthae   ??? Benign prostatic hyperplasia with incomplete bladder emptying   ??? ILD (interstitial lung disease) (CMS-HCC)   ??? Lung transplant recipient (CMS-HCC)       Reviewed and up to date in Epic.    Appropriateness of Therapy     Is medication and dose appropriate based on diagnosis? Yes    Prescription has been clinically reviewed: Yes    Baseline Quality of Life Assessment      How many days over the past month did your lung transplant  keep you from your normal activities? For example, brushing your teeth or getting up in the morning. 0 since discharge from hospital    Financial Information     Medication Assistance provided: None Required    Anticipated copay of $5 reviewed with patient. Verified delivery address.    Delivery Information     Scheduled delivery date: Patient declined filling today.  Has medication on hand at home.    Expected start date: Patient has medication on hand at home and is currently taking.    Medication will be delivered via UPS to the prescription address in [facility].  This shipment will not require a signature.      Explained the services we provide at [facility] and that each month we would call to set up refills.  Stressed importance of returning phone calls so that we could ensure they receive their medications in time each month.  Informed patient that we should be setting up refills 7-10 days prior to when they will run out of medication.  A pharmacist will reach out to perform a clinical assessment periodically.  Informed patient that a welcome packet and a drug information handout will  be sent.      Patient verbalized understanding of the above information as well as how to contact the pharmacy at [phone] option 4 with any questions/concerns.  The pharmacy is open Monday through Friday 8:30am-4:30pm.  A pharmacist is available 24/7 via pager to answer any clinical questions they may have.    Patient Specific Needs - Does the patient have any physical, cognitive, or cultural barriers? No    - Patient prefers to have medications discussed with  Patient     - Is the patient or caregiver able to read and understand education materials at a high school level or above? Yes    - Patient's primary language is  English     - Is the patient high risk? No    - Does the patient require a Care Management Plan? No     - Does the patient require physician intervention or other additional services (i.e. nutrition, smoking cessation, social work)? No      Tera Helper  [facility] Specialty Pharmacist

## 2020-06-19 NOTE — Unmapped (Signed)
OUTPATIENT PHYSICAL THERAPY PULMONARY DAILY NOTE  Date2/02/2021  Patient Name: Spencer COURY Sr.  Date of Birth:Feb 02, 1951  Visit #:  [id number]  Diagnosis:   Encounter Diagnoses   Name Primary?   ??? Lung transplant recipient (CMS-HCC) Yes   ??? Physical deconditioning    ??? Pre-transplant evaluation for lung transplant    ??? Impaired mobility and endurance    ??? S/P lung transplant (CMS-HCC)    ??? Type 2 diabetes mellitus without complication, without long-term current use of insulin (CMS-HCC)      Date of onset:  05/15/2020  Date of Eval: 06/04/2020  Referring MD: Reggie Pile Dermot   POC Dates: 06/04/2020 - 09/02/2020    ASSESSMENT:    70 y.o. male presents status post bilateral lung transplant which is limiting his ability to ambulate community distances and perform work-related tasks independently without experiencing fatigue, loss of balance or shortness of breath.  Patient requires skilled Physical Therapy services for generalized conditioning, gross BLE strengthening and exercises to improve cardiovascular endurance. During the PT evaluation, patient was able to ambulate 1,337 ft during a , however he reported a 6RPE on the modified BORG's scale, indicating moderately hard exertion while ambulating. Patient is unable to maintain static standing balance with vision-denied, and is unable to independently navigate compliant surfaces. He will benefit from neuromuscular reeducation to improve balance. Patient is highly motivated to return to [facility] and will benefit from skilled physical therapy.      Today's session focused on progressing pulmonary rehab with emphasis on cardiovascular endurance and LE strengthening. Patient demonstrated good dynamic stability when performing step-ups and multidirectional walking.   Next session: progress aerobic training and BLE strengthening exercises    Personal Factors/Comorbidities Present:  limited walking endurance, limited standing balance with vision-denied, shortness of breath with exercise, limited BUE AROM/strength due to sternal precautions, DM II, history of colon and liver cancers    Examination of Body Systems: cardiovascular, neuromuscular, musculoskeletal    Long Term Goals:  In 8 weeks:  1.   Ambulate 2,000 feet in 6 minutes for improved functional activity tolerance.    2.   Increase exercise tolerance to 30 minutes of sitting/standing exercise with modified BORG score of 3.    3.   Demonstrate decreased SOB with activity as demonstrated by a SOBQ score of 10% for improved quality of life.    4.   Patient will be independent with home program and/or transition into community exercise program.      Prognosis for goal achievement:  Good due to good motivation, supportive family, overall health status and motivation.    PLAN:   Treatment Plan and Educational Needs (check all that apply):  x Cough techniques    Acapella/Vest    Postural drainage/Percussion   x Breathing retraining   x Postural exercises    Footwear education   x General conditioning   x Progressive ambulation   x Strengthening   x Stretching   x Home exercise program   x Benefits of exercise    Lung anatomy and physiology/pathology    Infection prevention/treatment    Psychosocial education/support group    Breathing/relaxation techniques    Airway clearance    Healthy eating    Smoking cessation    Other        Pt. will participate in the following as needed:    x Manual Therapy   x Therapeutic Activities   x Therapeutic Exercise   x Neuromuscular Education  Self Care   x Postural Exercises/education    Unattended estim    taping    Scar massage    Therapeutic Dry Needling    Hot/Cold modalities    Cold Laser    Education       Planned frequency of treatment:  3x / week    Planned duration of treatment: 8 weeks      SUBJECTIVE:  Patient reports   Says that balance has been decent. Reports tightness along incision site. Insulin was high yesterday; they are changing medication. He has noticed a little clicking in his chest.    Reason for Referral/History of Present Condition/Onset of injury/exacerbation:   70 y.o.  male presents to outpatient physical therapy for pulmonary rehab status post lung transplant.   Per Dr. Venancio Poisson on 05/28/2020,  Spencer Pena is a 70 y.o. 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 VA ECMO time was 192 minutes and patient was successfully weaned off ECMO intra-op. Bilateral apical and basilar chest tubes inserted intra-op and placed to suction. He tolerated the procedure well and was transferred to the CT ICU while intubated for continued post-operative management given critical condition. Patient was successfully extubated on POD 1 and placed on HFNC.   ??Bilateral apical chest tubes removed on POD 4 and post-pull CXR without evidence of pneumothorax. Patient with interval development of Right sided subcutaneous emphysema on POD 4, which improved with bilateral basilar chest tubes to continuous suction. Bilateral basilar CTs placed to WS on POD 8 and removed on POD 9 and POD 10 with unremarkable post-pull CXR.   Epidural catheter removed on POD 2. NGT removed on POD 4 and diet advanced following unremarkable modified barium swallow study on 1/10. Patient successfully passed TOV on POD 7.   He did well postoperatively. Diet was slowly advanced and at the time of discharge, patient was tolerating a regular diet.??The patient was able to void spontaneously, have pain controlled with P.O. pain medication. Patient was evaluated by the Thoracic surgery service and all consultants and was found to be fit for discharge. Patient will be discharged home on Hospital Day 13?? in stable condition. Patient received teaching prior to discharge and appropriate post-op follow-up scheduled.    Home Environment:  Patient lives with wife in single story home with one step to enter.    Oxygen Use:   None at this time    Pulmonary Rehab History/ Home exercise:  Sit to stand for 30 seconds, 2x/day  Continue current walking regimen    Current Pain:  2/10 discomfort at L flank where he has pouch    Patient Goals:  Improve strength, Improve ambulation, Return to recreational activities, Return to work and Improve overhead reaching    OBJECTIVE :    Cough: occasional    Airway Clearance Techniques/Devices: N/A    Range of Motion Deficits: BUE limited by sternal precautions and tightness related to incisions    Strength Deficits: BUE and BLE strength WFL    Posture/skin/other: N/A    Gait Analysis:  decreased gait speed  decreased terminal hip ext on bilaterally    Functional Test/Outcome Measures:  30 second chair stand 18 repetitions    6 MWT distance 1,337 ft, sp02 98, HR 120s, AD used none at this time    SOBQ score 12%  Treatment Rendered:      Total Treatment Time: 45 min    Therapeutic Exercise: 45 min  Vitals: HR 107, SpO2 97%, BP 142/72 mmHg    Nustep, level 3, 5 min; level 4, 10.5 min, arms at level 11: 1,000 steps   Vitals: HR 110-120sbpm   SpO2: >96%    Step-ups onto 6 step, no UE support, SBA   - 2 x10 bilat forward step-ups    Alternating toe-taps onto 6 step x20    Multi-directional walking in T-shape (combining forward, backward, R, and L steps x4 each direction), 8x total  HR 127, SpO2 99%    Intermittent standing breaks provided.    Communication/consultation with other professionals:  medicare Cert/POC sent to referring practitioner  Initial note sent to referring practitioner     Equipment provided/recommended:   N/A     HEP:  Tries to walk 2000' inside his house daily.  Incentive spirometer  Sit to stand from chair  Standing heel lifts 2 x20  Standing hip abduction and extension 2 x10-15  Standing mini-squat 2 x10  Feet together, eyes closed, 7 heads with 10-sec hold    I attest that I have reviewed the above information.  Signed: Eulas Post, PT, DPT  06/19/2020 11:07 AM

## 2020-06-20 ENCOUNTER — Encounter: Admit: 2020-06-20 | Discharge: 2020-06-21 | Payer: MEDICARE

## 2020-06-20 ENCOUNTER — Encounter: Admit: 2020-06-20 | Discharge: 2020-06-21 | Payer: MEDICARE | Attending: Family | Primary: Family

## 2020-06-20 ENCOUNTER — Encounter: Admit: 2020-06-20 | Discharge: 2020-06-20 | Payer: MEDICARE

## 2020-06-20 DIAGNOSIS — R197 Diarrhea, unspecified: Principal | ICD-10-CM

## 2020-06-20 DIAGNOSIS — Z942 Lung transplant status: Principal | ICD-10-CM

## 2020-06-20 DIAGNOSIS — C189 Malignant neoplasm of colon, unspecified: Principal | ICD-10-CM

## 2020-06-20 DIAGNOSIS — Z794 Long term (current) use of insulin: Principal | ICD-10-CM

## 2020-06-20 DIAGNOSIS — K648 Other hemorrhoids: Principal | ICD-10-CM

## 2020-06-20 DIAGNOSIS — J439 Emphysema, unspecified: Principal | ICD-10-CM

## 2020-06-20 DIAGNOSIS — D509 Iron deficiency anemia, unspecified: Principal | ICD-10-CM

## 2020-06-20 DIAGNOSIS — K5790 Diverticulosis of intestine, part unspecified, without perforation or abscess without bleeding: Principal | ICD-10-CM

## 2020-06-20 DIAGNOSIS — N400 Enlarged prostate without lower urinary tract symptoms: Principal | ICD-10-CM

## 2020-06-20 DIAGNOSIS — Z48298 Encounter for aftercare following other organ transplant: Principal | ICD-10-CM

## 2020-06-20 DIAGNOSIS — Z20822 Contact with and (suspected) exposure to covid-19: Principal | ICD-10-CM

## 2020-06-20 DIAGNOSIS — I10 Essential (primary) hypertension: Principal | ICD-10-CM

## 2020-06-20 DIAGNOSIS — E1165 Type 2 diabetes mellitus with hyperglycemia: Principal | ICD-10-CM

## 2020-06-20 DIAGNOSIS — Z538 Procedure and treatment not carried out for other reasons: Principal | ICD-10-CM

## 2020-06-20 LAB — MAGNESIUM: MAGNESIUM: 1.7 mg/dL (ref 1.6–2.6)

## 2020-06-20 LAB — CBC W/ AUTO DIFF
BASOPHILS ABSOLUTE COUNT: 0 10*9/L (ref 0.0–0.1)
BASOPHILS RELATIVE PERCENT: 0.3 %
EOSINOPHILS ABSOLUTE COUNT: 0.1 10*9/L (ref 0.0–0.4)
EOSINOPHILS RELATIVE PERCENT: 1.4 %
HEMATOCRIT: 46.2 % (ref 41.0–53.0)
HEMOGLOBIN: 14.3 g/dL (ref 13.5–17.5)
LARGE UNSTAINED CELLS: 0 % (ref 0–4)
LYMPHOCYTES ABSOLUTE COUNT: 0.5 10*9/L — ABNORMAL LOW (ref 1.5–5.0)
LYMPHOCYTES RELATIVE PERCENT: 5 %
MEAN CORPUSCULAR HEMOGLOBIN CONC: 31 g/dL (ref 31.0–37.0)
MEAN CORPUSCULAR HEMOGLOBIN: 30.9 pg (ref 26.0–34.0)
MEAN CORPUSCULAR VOLUME: 99.6 fL (ref 80.0–100.0)
MEAN PLATELET VOLUME: 8.1 fL (ref 7.0–10.0)
MONOCYTES ABSOLUTE COUNT: 0.2 10*9/L (ref 0.2–0.8)
MONOCYTES RELATIVE PERCENT: 2.4 %
NEUTROPHILS ABSOLUTE COUNT: 8.3 10*9/L — ABNORMAL HIGH (ref 2.0–7.5)
NEUTROPHILS RELATIVE PERCENT: 90.5 %
PLATELET COUNT: 229 10*9/L (ref 150–440)
RED BLOOD CELL COUNT: 4.63 10*12/L (ref 4.50–5.90)
RED CELL DISTRIBUTION WIDTH: 18.4 % — ABNORMAL HIGH (ref 12.0–15.0)
WBC ADJUSTED: 9.1 10*9/L (ref 4.5–11.0)

## 2020-06-20 LAB — COMPREHENSIVE METABOLIC PANEL
ALBUMIN: 3.7 g/dL (ref 3.4–5.0)
ALKALINE PHOSPHATASE: 88 U/L (ref 46–116)
ALT (SGPT): 27 U/L (ref 10–49)
ANION GAP: 8 mmol/L (ref 5–14)
AST (SGOT): 22 U/L (ref <=34)
BILIRUBIN TOTAL: 0.4 mg/dL (ref 0.3–1.2)
BLOOD UREA NITROGEN: 37 mg/dL — ABNORMAL HIGH (ref 9–23)
BUN / CREAT RATIO: 27
CALCIUM: 10.1 mg/dL (ref 8.7–10.4)
CHLORIDE: 100 mmol/L (ref 98–107)
CO2: 26 mmol/L (ref 20.0–31.0)
CREATININE: 1.38 mg/dL — ABNORMAL HIGH
EGFR CKD-EPI AA MALE: 60 mL/min/{1.73_m2} (ref >=60)
EGFR CKD-EPI NON-AA MALE: 52 mL/min/{1.73_m2} — ABNORMAL LOW (ref >=60)
GLUCOSE RANDOM: 139 mg/dL — ABNORMAL HIGH (ref 70–99)
POTASSIUM: 4.5 mmol/L (ref 3.4–4.5)
PROTEIN TOTAL: 7 g/dL (ref 5.7–8.2)
SODIUM: 134 mmol/L — ABNORMAL LOW (ref 135–145)

## 2020-06-20 LAB — TACROLIMUS LEVEL, TROUGH: TACROLIMUS, TROUGH: 8.4 ng/mL (ref 5.0–15.0)

## 2020-06-20 LAB — IGG: GAMMAGLOBULIN; IGG: 701 mg/dL (ref 646–2013)

## 2020-06-20 LAB — SLIDE REVIEW

## 2020-06-20 LAB — CMV DNA, QUANTITATIVE, PCR: CMV VIRAL LD: NOT DETECTED

## 2020-06-20 LAB — PHOSPHORUS: PHOSPHORUS: 2.9 mg/dL (ref 2.4–5.1)

## 2020-06-20 NOTE — Unmapped (Signed)
Indication:  Concern for pleural effusion    Procedure:  Limited Chest Ultrasound bilaterally    Findings:  With the patient in the Sitting position, the Bilateral was examined in the mid scapular line and posterior axillary line.      No significant pleural effusion was noted on bilateral chest ultrasound.      Interpretation:  A thoracentesis was not performed due to the absence of effusion    Dr. Erick Colace was present for evaluation    Claudia Pollock, MD  Interventional Pulmonology Fellow  Division of Pulmonary Diseases and Critical Care Medicine  Pager: [phone]

## 2020-06-20 NOTE — Unmapped (Signed)
Pt came to bronch lab for ultrasound inspection. No pleural fluid found via ultrasound

## 2020-06-20 NOTE — Unmapped (Signed)
Called Spencer Mallet Sr. to discuss lab results. Tacrolimus level was normal at 8.4 with a goal of 8-10. Medication regimen is currently 0.5 mg BID. Dosing at this time to remain the same. Repeat labs scheduled one week. Resulted labs reviewed. All questions answered. Pt verbalized understanding.

## 2020-06-20 NOTE — Unmapped (Signed)
[facility] Clinical Assessment & Refill Coordination Note    Spencer Pena Sr., DOB: 06/06/1950  Phone: [phone] (home) [phone] (work)    All above HIPAA information was verified with patient.     Was a Nurse, learning disability used for this call? No    Specialty Medication(s):   Transplant: valgancyclovir 450mg      Current Outpatient Medications   Medication Sig Dispense Refill   ??? acetaminophen (TYLENOL EXTRA STRENGTH) 500 MG tablet Take 2 tablets (1,000 mg total) by mouth Three (3) times a day. 180 tablet 0   ??? albuterol HFA 90 mcg/actuation inhaler Inhale 2 puffs every four (4) hours as needed for wheezing or shortness of breath. 8.5 g 2   ??? azaTHIOprine (IMURAN) 50 mg tablet Take 2 tablets (100 mg total) by mouth daily. 60 tablet 11   ??? azithromycin (ZITHROMAX) 250 MG tablet Take 1 tablet (250 mg total) by mouth daily. 30 tablet 11   ??? blood sugar diagnostic (ON CALL EXPRESS TEST STRIP) Strp Test daily before all meals/snacks and once before bedtime. 100 each 11   ??? calcium carbonate (CALCIUM 600) 1,500 mg (600 mg elem calcium) tablet Take 1 tablet (600 mg of elem calcium total) by mouth Two (2) times a day. 60 tablet 11   ??? cholecalciferol, vitamin D3 25 mcg, 1,000 units,, 1,000 unit (25 mcg) tablet Take 2 tablets (50 mcg total) by mouth daily. 100 tablet 11   ??? gabapentin (NEURONTIN) 100 MG capsule Take 1 capsule (100 mg total) by mouth Three (3) times a day. 90 capsule 2   ??? insulin ASPART (NOVOLOG FLEXPEN U-100 INSULIN) 100 unit/mL (3 mL) injection pen Inject 6 units with breakfast, 6 units with afternoon meal, and 4 units with evening snack plus sliding scale 2 units for every 50 >150; max 25 units/day 15 mL 2   ??? insulin glargine (BASAGLAR, LANTUS) 100 unit/mL (3 mL) injection pen Inject 0.17 mL (17 Units total) under the skin nightly. 15 mL 3   ??? lancets 30 gauge Misc Test daily before all meals/snacks and once before bedtime. 100 each 11   ??? magnesium oxide (MAG-OX) 400 mg (241.3 mg elemental magnesium) tablet Take 2 tablets (800 mg total) by mouth Two (2) times a day. 120 tablet 11   ??? metFORMIN (GLUCOPHAGE) 1000 MG tablet Take 1 tablet (1,000 mg total) by mouth Two (2) times a day. 60 tablet 11   ??? oseltamivir (TAMIFLU) 75 MG capsule Take 1 capsule (75 mg total) by mouth daily. 20 capsule 2   ??? pantoprazole (PROTONIX) 40 MG tablet Take 1 tablet (40 mg total) by mouth Two (2) times a day. 60 tablet 11   ??? pen needle, diabetic (BD ULTRA-FINE NANO PEN NEEDLE) 32 gauge x 5/32 (4 mm) Ndle Use one new pen needle under the skin Three (3) times a day as directed 200 each 2   ??? posaconazole (NOXAFIL) 100 mg delayed released tablet Take 3 tablets (300 mg total) by mouth daily. 21 tablet 3   ??? pravastatin (PRAVACHOL) 40 MG tablet Take 1 tablet (40 mg total) by mouth daily. 30 tablet 11   ??? predniSONE (DELTASONE) 5 MG tablet Take 4 tablets (20 mg total) by mouth daily. 120 tablet 11   ??? sulfamethoxazole-trimethoprim (BACTRIM) 400-80 mg per tablet Take 1 tablet (80 mg of trimethoprim total) by mouth Every Monday, Wednesday, and Friday. 36 tablet 3   ??? tacrolimus (PROGRAF) 0.5 MG capsule Take 1 capsule (0.5 mg total)  by mouth two (2) times a day. 180 capsule 3   ??? terazosin (HYTRIN) 1 MG capsule Take 2 capsules (2 mg total) by mouth nightly. 60 capsule 11   ??? valGANciclovir (VALCYTE) 450 mg tablet Take 2 tablets (900 mg total) by mouth daily. 60 tablet 11     No current facility-administered medications for this visit.        Changes to medications: Caton reports no changes at this time.    Allergies   Allergen Reactions   ??? Cetuximab Anaphylaxis     Chest pain/rapid heart rate/ BP dropped   ??? Nsaids (Non-Steroidal Anti-Inflammatory Drug)      Interaction with transplant medications   ??? Venom-Honey Bee Anaphylaxis   ??? Enalapril      Angioedema     ??? Pollen Extracts        Changes to allergies: No    SPECIALTY MEDICATION ADHERENCE     Valganciclovir 450 mg: 7 days of medicine on hand Medication Adherence    Patient reported X missed doses in the last month: 0  Specialty Medication: Valganciclovir 450mg   Patient is on additional specialty medications: No          Specialty medication(s) dose(s) confirmed: Regimen is correct and unchanged.     Are there any concerns with adherence? No    Adherence counseling provided? Not needed    CLINICAL MANAGEMENT AND INTERVENTION      Clinical Benefit Assessment:    Do you feel the medicine is effective or helping your condition? Yes    Clinical Benefit counseling provided? Not needed    Adverse Effects Assessment:    Are you experiencing any side effects? No    Are you experiencing difficulty administering your medicine? No    Quality of Life Assessment:    How many days over the past month did your lung transplant  keep you from your normal activities? For example, brushing your teeth or getting up in the morning. 0    Have you discussed this with your provider? Not needed    Therapy Appropriateness:    Is therapy appropriate? Yes, therapy is appropriate and should be continued    DISEASE/MEDICATION-SPECIFIC INFORMATION      N/A    PATIENT SPECIFIC NEEDS     - Does the patient have any physical, cognitive, or cultural barriers? No    - Is the patient high risk? Yes, patient is taking a REMS drug. Medication is dispensed in compliance with REMS program    - Does the patient require a Care Management Plan? No     - Does the patient require physician intervention or other additional services (i.e. nutrition, smoking cessation, social work)? No      SHIPPING     Specialty Medication(s) to be Shipped:   Transplant: valgancyclovir 450mg     Other medication(s) to be shipped: oseltamivir, pantoprazole, pravastatin, azithromycin, calcium, mg     Changes to insurance: No    Delivery Scheduled: Yes, Expected medication delivery date: 06/23/20.     Medication will be delivered via Same Day Courier to the confirmed prescription address in [facility].    The patient will receive a drug information handout for each medication shipped and additional FDA Medication Guides as required.  Verified that patient has previously received a Conservation officer, historic buildings.    All of the patient's questions and concerns have been addressed.    Tera Helper   [facility] Specialty Pharmacist

## 2020-06-20 NOTE — Unmapped (Signed)
In with Dr. Glenard Haring to see Spencer Mallet Sr.  Overall doing well, PFTs are up.  Will see IP after clinic to see if they can tap fluid in right lung.  Reports clicking in chest, denies pain.  Return to clinic in one week.  Questions answered, verbalized understanding.

## 2020-06-20 NOTE — Unmapped (Signed)
Please see separate procedure note under the Procedures tab in Epic.    Claudia Pollock, MD  Interventional Pulmonology Fellow

## 2020-06-20 NOTE — Unmapped (Signed)
[facility] CLINIC PHARMACY NOTE  06/20/2020   Spencer PETRON Sr.  [id number]    Medication changes today:   1. Increase lantus to 18 units daily  2. Increase novolog to 7/7/4 + SSI, 2 units for every 50>150  3. Continue to hold lasix    Education/Adherence tools provided today:  1. provided updated medication list  2. provided additional pill box education    Follow up items:  1. BG control with insulin adjustments  2. Renal adjust Tamiflu and Valcyte if renal function worsens  3. Improvement/resolution of diarrhea  4. Posaconazole level at next encounter    Next visit with pharmacy in 1-2 weeks  ____________________________________________________________________      Spencer Mallet Sr. is a 70 y.o. 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     Other PMH significant for hypertension, T2DM, colon cancer s/p hemicolectomy and R hepatectomy for metastasis + chemotherapy (2007), BPH    Seen by pharmacy today for: pill box assessment and adherence education and medication management    CC:  Patient complains of diarrhea that has improved from previous visit.     BP 123/66  - Pulse 114  - Temp 36.4 ??C (97.6 ??F) (Tympanic)  - Ht 170.2 cm (5' 7.01)  - Wt 70.8 kg (156 lb)  - SpO2 97%  - BMI 24.43 kg/m??      Allergies   Allergen Reactions   ??? Cetuximab Anaphylaxis     Chest pain/rapid heart rate/ BP dropped   ??? Nsaids (Non-Steroidal Anti-Inflammatory Drug)      Interaction with transplant medications   ??? Venom-Honey Bee Anaphylaxis   ??? Enalapril      Angioedema     ??? Pollen Extracts      Medications reviewed in EPIC medication station and updated today by the clinical pharmacist practitioner. Medication list includes revisions made during today???s encounter.    Outpatient Encounter Medications as of 06/20/2020   Medication Sig Dispense Refill   ??? acetaminophen (TYLENOL EXTRA STRENGTH) 500 MG tablet Take 2 tablets (1,000 mg total) by mouth Three (3) times a day. 180 tablet 0   ??? albuterol HFA 90 mcg/actuation inhaler Inhale 2 puffs every four (4) hours as needed for wheezing or shortness of breath. 8.5 g 2   ??? azaTHIOprine (IMURAN) 50 mg tablet Take 2 tablets (100 mg total) by mouth daily. 60 tablet 11   ??? azithromycin (ZITHROMAX) 250 MG tablet Take 1 tablet (250 mg total) by mouth daily. 30 tablet 11   ??? blood sugar diagnostic (ON CALL EXPRESS TEST STRIP) Strp Test daily before all meals/snacks and once before bedtime. 100 each 11   ??? calcium carbonate (CALCIUM 600) 1,500 mg (600 mg elem calcium) tablet Take 1 tablet (600 mg of elem calcium total) by mouth Two (2) times a day. 60 tablet 11   ??? cholecalciferol, vitamin D3 25 mcg, 1,000 units,, 1,000 unit (25 mcg) tablet Take 2 tablets (50 mcg total) by mouth daily. 100 tablet 11   ??? gabapentin (NEURONTIN) 100 MG capsule Take 1 capsule (100 mg total) by mouth Three (3) times a day. 90 capsule 2   ??? insulin ASPART (NOVOLOG FLEXPEN U-100 INSULIN) 100 unit/mL (3 mL) injection pen Inject 7 units with breakfast, 7 units with afternoon meal, and 4 units with evening snack plus sliding scale 2 units for every 50 >150; max 30 units/day 15 mL 2   ??? insulin glargine (BASAGLAR, LANTUS) 100 unit/mL (3 mL)  injection pen Inject 0.18 mL (18 Units total) under the skin nightly. 15 mL 3   ??? lancets 30 gauge Misc Test daily before all meals/snacks and once before bedtime. 100 each 11   ??? magnesium oxide (MAG-OX) 400 mg (241.3 mg elemental magnesium) tablet Take 2 tablets (800 mg total) by mouth Two (2) times a day. 120 tablet 11   ??? metFORMIN (GLUCOPHAGE) 1000 MG tablet Take 1 tablet (1,000 mg total) by mouth Two (2) times a day. 60 tablet 11   ??? oseltamivir (TAMIFLU) 75 MG capsule Take 1 capsule (75 mg total) by mouth daily. 20 capsule 2   ??? pantoprazole (PROTONIX) 40 MG tablet Take 1 tablet (40 mg total) by mouth Two (2) times a day. 60 tablet 11   ??? pen needle, diabetic (BD ULTRA-FINE NANO PEN NEEDLE) 32 gauge x 5/32 (4 mm) Ndle Use one new pen needle under the skin Three (3) times a day as directed 200 each 2   ??? posaconazole (NOXAFIL) 100 mg delayed released tablet Take 3 tablets (300 mg total) by mouth daily. 21 tablet 3   ??? pravastatin (PRAVACHOL) 40 MG tablet Take 1 tablet (40 mg total) by mouth daily. 30 tablet 11   ??? predniSONE (DELTASONE) 5 MG tablet Take 4 tablets (20 mg total) by mouth daily. 120 tablet 11   ??? sulfamethoxazole-trimethoprim (BACTRIM) 400-80 mg per tablet Take 1 tablet (80 mg of trimethoprim total) by mouth Every Monday, Wednesday, and Friday. 36 tablet 3   ??? tacrolimus (PROGRAF) 0.5 MG capsule Take 1 capsule (0.5 mg total) by mouth two (2) times a day. 180 capsule 3   ??? terazosin (HYTRIN) 1 MG capsule Take 2 capsules (2 mg total) by mouth nightly. 60 capsule 11   ??? valGANciclovir (VALCYTE) 450 mg tablet Take 2 tablets (900 mg total) by mouth daily. 60 tablet 11   ??? [DISCONTINUED] insulin ASPART (NOVOLOG FLEXPEN U-100 INSULIN) 100 unit/mL (3 mL) injection pen Inject 6 units with breakfast, 6 units with afternoon meal, and 4 units with evening snack plus sliding scale 2 units for every 50 >150; max 25 units/day 15 mL 2   ??? [DISCONTINUED] insulin glargine (BASAGLAR, LANTUS) 100 unit/mL (3 mL) injection pen Inject 0.17 mL (17 Units total) under the skin nightly. 15 mL 3     No facility-administered encounter medications on file as of 06/20/2020.       Immunosuppression:  Induction agent: basiliximab    Current immunosuppression:  ??? tacrolimus 0.5 mg PO qAM + 0.5 mg qPM  o prograf goal: 8-10  ??? Azathioprine 100 mg daily - changed from MMF 2/2 diarrhea  ??? prednisone 20 mg daily    Patient complains of diarrhea that is improving since switching to azathioprine.    IMMUNOSUPPRESSION DRUG LEVELS:  Lab Results   Component Value Date    Tacrolimus, Trough 8.4 06/20/2020    Tacrolimus, Trough 12.3 06/13/2020    Tacrolimus, Trough 9.2 06/03/2020     Tacrolimus level is accurate 12 hour trough, last dose yesterday at 9 pm    Graft function assessment via PFTs: improving  DSA: last checked 06/13/20 - NTD  Biopsies to date: none  WBC/ANC:  WBC WNL; ANC elevated    Plan: Will maintain current immunosuppression; tacrolimus level pending today     OI prophylaxis:   CMV Status: D+/ R-, high risk. CMV prophylaxis with valganciclovir 900 mg daily x 12 months per protocol.  Estimated Creatinine Clearance: 47.2 mL/min (A) (based on SCr of  1.38 mg/dL (H)).  PCP: Prophylaxis with bactrim SS 1 tab MWF indefinitely.  Fungal: Currently on posaconazole 300 mg daily for prophylaxis x 3 months per protocol.  ?? Last trough: 1117 (06/13/20)  ?? Goal: > 700 ng/ml   Flu (seasonal): oseltamivir (Tamiflu) 75 mg daily    Patient is tolerating infectious prophylaxis well. Renal function improving.     Plan: Continue per protocol. Consider dose adjusting Tamiflu and Valcyte at future visit if renal function no longer improving.    Infectious History:  Patient does not have a history of pathogenic infectious complications following lung transplant. 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 Previous antiinfective history use includes meropenem x7 days for broad spectrum gram negative and aspiration PNA coverage post-op per ICID.     Plan: Continue to monitor.    BOS prophylaxis:  Current meds include: azithromycin 250 mg daily and pantoprazole 40 mg BID  Pt denies reflux/heartburn. Esophageal motility study scheduled for 07/23/20.    Plan: Continue to monitor. Plan to hold PPI for five days prior to motility study.     CV Prophylaxis:   Aspirin: not on currently  The 10-year ASCVD risk score Denman George DC Jr., et al., 2013) is: 23.7%  Statin: pravastatin; currently on 40 mg daily.    Plan: Consider initiating aspirin 81 mg daily for primary prevention at a future visit if continues to remain stable post-transplant.    BP/Edema: Goal < 140/90. Encounter vitals reported above.  Home BP ranges: 104-130/60-76 mmHg; HR 90s-100s  Weights: have decreased from 160 lb to 154 lb  Current meds include: terazosin 2 mg at bedtime for BPH    Plan: BP within goal per home readings. SCr has decreased from last visit. Continue to hold Lasix. See below for terazosin plan.    Anemia:  H/H:   Lab Results   Component Value Date    HGB 14.3 06/20/2020     Lab Results   Component Value Date    HCT 46.2 06/20/2020     Iron panel:  Lab Results   Component Value Date    IRON 21 (L) 05/19/2020    TIBC 226 (L) 05/19/2020    FERRITIN 88.6 05/19/2020     Lab Results   Component Value Date    Iron Saturation (%) 9 05/19/2020     Prior anemia therapy: IV Ferrlecit (1/10-1/13)    Plan: H/H improved since discharge. Continue to monitor.     DM:   Lab Results   Component Value Date    A1C 6.0 (H) 06/13/2020   Goal A1c < 7%.  Current meds include:  ?? Insulin Glargine 17 units daily in AM  ?? Novolog SSI 2:50>150  ?? Novolog units 6/6/4  ?? Metformin 1000 mg PO BID  ?? HOLD home glipizide  Home BS log:   Breakfast  Lunch  Dinner  HS   Date Fasting PC AC PC AC PC    06/14/20 173  152  172     06/15/20 160  247  156     06/16/20 129  301  200     06/17/20 233  258  191     06/18/20 151  409  170     06/19/20 152  202  264       Diet: Biggest meal of the day is around 3 pm. Dinner meal is lighter in portion size. Wife has to encourage/remind him to eat at times, trying to incorporate good amount of protein  via shakes  Exercise: walking 100 ft distance 10-20 times every day  Hypoglycemia: no  Plan: Majority of BGs above goal although somewhat improved from previous visit. Will increase lantus to 18 units, and increase Novolog to 7/7/4 + SSI.    Electrolytes: wnl  Current meds include: MgOx 800 mg BID    Plan: Continue    GI/BM: Diarrhea has decreased from 2-3x a day to approximately once per day, denies nausea or abdominal cramping  Current meds include: none; previously on ondansetron, Miralax, and senna    Plan: Continue to monitor.     Pain: pt reports pain is generally mild and manageable with Tylenol, but does still experience some tingling/nerve pain at incision site on chest/abdomen - improved with gabapentin; no longer taking oxycodone  Current meds include: gabapentin 100 mg TID, APAP 100 mg TID    Plan: continue gabapentin 100 mg TID    Bone health:  Patient currently on long-term steroid therapy.  Vitamin D Level: 27.0 (05/24/20). Goal > 30.   Last DEXA results (02/2020 pre-transplant): normal bone density  Current meds include: vitamin D3 2000 units daily, calcium carbonate 600 mg BID    Plan: Continue to monitor.     Women's/Men's Health:  Sabri Teal. is a 70 y.o. male. Patient reports history of BPH. Notes that his BPH symptoms have improved since increasing terazosin.  Current meds include: terazosin 2 mg PO nightly    Plan: Continue terazosin to 2mg  qHS . Continue to monitor.    Adherence:   Patient has poor understanding of medications.  Patient does not fill their own pill box on a regular basis at home. Wife currently fills.  Patient brought medication card: yes  Pill box: was correct  Patient requested refills for the following meds: None; Tamiflu due to be delivered today; wife will reach out if it does not arrive  Corrections needed in Epic medication list: none     Plan: Provided moderate adherence counseling/intervention    I spent a total of 30 minutes face to face with the patient delivering clinical care and providing education/counseling.    Patient was reviewed with Dr.Coakley who was agreement with the stated plan.    During this visit, the following was completed:   BG log data assessment  BP log data assessment  Labs ordered and evaluated  complex treatment plan >1 DS   Patient education was completed for 25-60 minutes     All questions/concerns were addressed to the patient's satisfaction.  __________________________________________  PATIENT SEEN AND EVALUATED BY:    Erin E. Manson Passey, PharmD, BCPS  PGY2 Pharmacy Resident, Pharmacotherapy    Marieann Zipp TEETER Lewanna Petrak, PHARMD, BCPS, CPP  [employer]ID [employer] TRANSPLANT PHARMACIST PRACTITIONER  PAGER [phone]

## 2020-06-22 LAB — EBV QUANTITATIVE PCR, BLOOD: EBV VIRAL LOAD RESULT: NOT DETECTED

## 2020-06-23 MED FILL — VALGANCICLOVIR 450 MG TABLET: ORAL | 30 days supply | Qty: 60 | Fill #0

## 2020-06-23 MED FILL — PANTOPRAZOLE 40 MG TABLET,DELAYED RELEASE: ORAL | 30 days supply | Qty: 60 | Fill #0

## 2020-06-23 MED FILL — AZITHROMYCIN 250 MG TABLET: ORAL | 30 days supply | Qty: 30 | Fill #0

## 2020-06-23 MED FILL — PRAVASTATIN 40 MG TABLET: ORAL | 30 days supply | Qty: 30 | Fill #0

## 2020-06-23 MED FILL — MAGNESIUM OXIDE 400 MG (241.3 MG MAGNESIUM) TABLET: ORAL | 30 days supply | Qty: 120 | Fill #0

## 2020-06-23 MED FILL — CALCIUM CARBONATE 600 MG CALCIUM (1,500 MG) TABLET: ORAL | 30 days supply | Qty: 60 | Fill #0

## 2020-06-24 DIAGNOSIS — E119 Type 2 diabetes mellitus without complications: Principal | ICD-10-CM

## 2020-06-24 DIAGNOSIS — R5381 Other malaise: Principal | ICD-10-CM

## 2020-06-24 DIAGNOSIS — Z942 Lung transplant status: Principal | ICD-10-CM

## 2020-06-24 DIAGNOSIS — Z7409 Other reduced mobility: Principal | ICD-10-CM

## 2020-06-24 DIAGNOSIS — Z01818 Encounter for other preprocedural examination: Principal | ICD-10-CM

## 2020-06-24 DIAGNOSIS — R0602 Shortness of breath: Principal | ICD-10-CM

## 2020-06-24 MED ORDER — INSULIN GLARGINE (U-100) 100 UNIT/ML (3 ML) SUBCUTANEOUS PEN
Freq: Every evening | SUBCUTANEOUS | 3 refills | 83.00000 days | Status: CP
Start: 2020-06-24 — End: 2020-06-27

## 2020-06-24 MED ORDER — INSULIN ASPART (U-100) 100 UNIT/ML (3 ML) SUBCUTANEOUS PEN
2 refills | 0 days | Status: CP
Start: 2020-06-24 — End: 2020-06-27

## 2020-06-24 NOTE — Unmapped (Signed)
OUTPATIENT PHYSICAL THERAPY PULMONARY DAILY NOTE  Date2/15/2022  Patient Name: Spencer HUNGER Sr.  Date of Birth:[birth year]  Visit #:  8  Diagnosis:   Encounter Diagnoses   Name Primary?   ??? Lung transplant recipient (CMS-HCC) Yes   ??? Impaired mobility and endurance    ??? Physical deconditioning    ??? S/P lung transplant (CMS-HCC)    ??? Type 2 diabetes mellitus without complication, without long-term current use of insulin (CMS-HCC)      Date of onset:  05/15/2020  Date of Eval: 06/04/2020  Referring MD: Reggie Pile Dermot   POC Dates: 06/04/2020 - 09/02/2020    ASSESSMENT:    70 y.o. male presents status post bilateral lung transplant which is limiting his ability to ambulate community distances and perform work-related tasks independently without experiencing fatigue, loss of balance or shortness of breath.  Patient requires skilled Physical Therapy services for generalized conditioning, gross BLE strengthening and exercises to improve cardiovascular endurance. During the PT evaluation, patient was able to ambulate 1,337 ft during a , however he reported a 6RPE on the modified BORG's scale, indicating moderately hard exertion while ambulating. Patient is unable to maintain static standing balance with vision-denied, and is unable to independently navigate compliant surfaces. He will benefit from neuromuscular reeducation to improve balance. Patient is highly motivated to return to [facility] and will benefit from skilled physical therapy.      Today's session focused on progressing pulmonary rehab with emphasis on cardiovascular endurance and LE strengthening. Patient demonstrated good dynamic stability when performing step-ups and multidirectional walking. He was able to complete a mile on the Nustep without requiring rest break or reports of RPE on the modified Borg >4.   Next session: progress aerobic training and BLE strengthening exercises    Personal Factors/Comorbidities Present:  limited walking endurance, limited standing balance with vision-denied, shortness of breath with exercise, limited BUE AROM/strength due to sternal precautions, DM II, history of colon and liver cancers    Examination of Body Systems: cardiovascular, neuromuscular, musculoskeletal    Long Term Goals:  In 8 weeks:  1.   Ambulate 2,000 feet in 6 minutes for improved functional activity tolerance.    2.   Increase exercise tolerance to 30 minutes of sitting/standing exercise with modified BORG score of 3.    3.   Demonstrate decreased SOB with activity as demonstrated by a SOBQ score of 10% for improved quality of life.    4.   Patient will be independent with home program and/or transition into community exercise program.      Prognosis for goal achievement:  Good due to good motivation, supportive family, overall health status and motivation.    PLAN:   Treatment Plan and Educational Needs (check all that apply):  x Cough techniques    Acapella/Vest    Postural drainage/Percussion   x Breathing retraining   x Postural exercises    Footwear education   x General conditioning   x Progressive ambulation   x Strengthening   x Stretching   x Home exercise program   x Benefits of exercise    Lung anatomy and physiology/pathology    Infection prevention/treatment    Psychosocial education/support group    Breathing/relaxation techniques    Airway clearance    Healthy eating    Smoking cessation    Other        Pt. will participate in the following as needed:    x Manual Therapy   x Therapeutic  Activities   x Therapeutic Exercise   x Neuromuscular Education    Self Care   x Postural Exercises/education    Unattended estim    taping    Scar massage    Therapeutic Dry Needling    Hot/Cold modalities    Cold Laser    Education       Planned frequency of treatment:  3x / week    Planned duration of treatment: 8 weeks      SUBJECTIVE:  Reports having difficulty sleeping secondary to chest discomfort    Reason for Referral/History of Present Condition/Onset of injury/exacerbation:   70 y.o.  male presents to outpatient physical therapy for pulmonary rehab status post lung transplant.   Per Dr. Venancio Poisson on 05/28/2020,  Spencer Pena is a 70 y.o. 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 [facility] ECMO time was 192 minutes and patient was successfully weaned off ECMO intra-op. Bilateral apical and basilar chest tubes inserted intra-op and placed to suction. He tolerated the procedure well and was transferred to the CT ICU while intubated for continued post-operative management given critical condition. Patient was successfully extubated on POD 1 and placed on HFNC.   ??Bilateral apical chest tubes removed on POD 4 and post-pull CXR without evidence of pneumothorax. Patient with interval development of Right sided subcutaneous emphysema on POD 4, which improved with bilateral basilar chest tubes to continuous suction. Bilateral basilar CTs placed to WS on POD 8 and removed on POD 9 and POD 10 with unremarkable post-pull CXR.   Epidural catheter removed on POD 2. NGT removed on POD 4 and diet advanced following unremarkable modified barium swallow study on 1/10. Patient successfully passed TOV on POD 7.   He did well postoperatively. Diet was slowly advanced and at the time of discharge, patient was tolerating a regular diet.??The patient was able to void spontaneously, have pain controlled with P.O. pain medication. Patient was evaluated by the Thoracic surgery service and all consultants and was found to be fit for discharge. Patient will be discharged home on Hospital Day 13?? in stable condition. Patient received teaching prior to discharge and appropriate post-op follow-up scheduled.    Home Environment:  Patient lives with wife in single story home with one step to enter.    Oxygen Use:   None at this time    Pulmonary Rehab History/ Home exercise:  Sit to stand for 30 seconds, 2x/day  Continue current walking regimen    Current Pain:  2/10 discomfort in his chest    Patient Goals:  Improve strength, Improve ambulation, Return to recreational activities, Return to work and Improve overhead reaching    OBJECTIVE :    Cough: occasional    Airway Clearance Techniques/Devices: N/A    Range of Motion Deficits: BUE limited by sternal precautions and tightness related to incisions    Strength Deficits: BUE and BLE strength WFL    Posture/skin/other: N/A    Gait Analysis:  decreased gait speed  decreased terminal hip ext on bilaterally    Functional Test/Outcome Measures:  30 second chair stand 18 repetitions    6 MWT distance 1,337 ft, sp02 98, HR 120s, AD used none at this time    SOBQ score 12%    Treatment Rendered:      Total Treatment Time: 35  min  Therapeutic Exercise: 30 min  Vitals: HR 118bpm, SpO2 97%, BP 151/62 mmHg    Nustep, level 3, 15 min, arms at level 11: 1 mile, 23 min.   Vitals: HR 110-120sbpm   SpO2: >96%    Multi-directional walking in T-shape (combining forward, backward, R, and L steps x4 each direction), 8x total  HR 127, SpO2 99%    Intermittent standing breaks provided.    Communication/consultation with other professionals:  medicare Cert/POC sent to referring practitioner  Initial note sent to referring practitioner     Equipment provided/recommended:   N/A     HEP:  Tries to walk 2000' inside his house daily.  Incentive spirometer  Sit to stand from chair  Standing heel lifts 2 x20  Standing hip abduction and extension 2 x10-15  Standing mini-squat 2 x10  Feet together, eyes closed, 7 heads with 10-sec hold    I attest that I have reviewed the above information.  Signed: Tona Sensing, PT, DPT  06/24/2020 10:48 AM

## 2020-06-25 ENCOUNTER — Encounter
Admit: 2020-06-25 | Discharge: 2020-07-24 | Payer: MEDICARE | Attending: Rehabilitative and Restorative Service Providers" | Primary: Rehabilitative and Restorative Service Providers"

## 2020-06-25 ENCOUNTER — Ambulatory Visit
Admit: 2020-06-25 | Discharge: 2020-07-24 | Payer: MEDICARE | Attending: Rehabilitative and Restorative Service Providers" | Primary: Rehabilitative and Restorative Service Providers"

## 2020-06-25 MED FILL — PREDNISONE 5 MG TABLET: ORAL | 30 days supply | Qty: 120 | Fill #0

## 2020-06-25 MED FILL — GABAPENTIN 100 MG CAPSULE: ORAL | 30 days supply | Qty: 90 | Fill #0

## 2020-06-25 NOTE — Unmapped (Signed)
[facility] Refill Coordination Note    Specialty Medication(s) to be Shipped:   Transplant: Prednisone 5mg     Other medication(s) to be shipped: gabapentin     [name], DOB: 1950-10-23  Phone: [phone] (home) [phone] (work)      All above HIPAA information was verified with patient's family member, Spoke with Mrs.  today..     Was a translator used for this call? No    Completed refill call assessment today to schedule patient's medication shipment from the [facility] [phone]).       Specialty medication(s) and dose(s) confirmed: Regimen is correct and unchanged.   Changes to medications: Rube reports no changes at this time.  Changes to insurance: No  Questions for the pharmacist: No    Confirmed patient received Welcome Packet with first shipment. The patient will receive a drug information handout for each medication shipped and additional FDA Medication Guides as required.       DISEASE/MEDICATION-SPECIFIC INFORMATION        N/A    SPECIALTY MEDICATION ADHERENCE     Medication Adherence    Patient reported X missed doses in the last month: 0  Specialty Medication: Prednisone 5mg   Patient is on additional specialty medications: No                Prednisone 5 mg: 3 days of medicine on hand        SHIPPING     Shipping address confirmed in Epic.     Delivery Scheduled: Yes, Expected medication delivery date: 06/26/20.     Medication will be delivered via UPS to the prescription address in Epic WAM.    Tera Helper   [facility] Specialty Pharmacist

## 2020-06-25 NOTE — Unmapped (Signed)
OUTPATIENT PHYSICAL THERAPY PULMONARY DAILY NOTE  Date2/16/2022  Patient Name: Spencer Pena Sr.  Date of Birth:[birth year]  Visit #:  9  Diagnosis:   Encounter Diagnoses   Name Primary?   ??? Lung transplant recipient (CMS-HCC) Yes   ??? S/P lung transplant (CMS-HCC)    ??? Impaired mobility and endurance    ??? Type 2 diabetes mellitus without complication, without long-term current use of insulin (CMS-HCC)    ??? Physical deconditioning    ??? Pre-transplant evaluation for lung transplant      Date of onset:  05/15/2020  Date of Eval: 06/04/2020  Referring MD: Reggie Pile Dermot   POC Dates: 06/04/2020 - 09/02/2020    ASSESSMENT:    70 y.o. male presents status post bilateral lung transplant which is limiting his ability to ambulate community distances and perform work-related tasks independently without experiencing fatigue, loss of balance or shortness of breath.  Patient requires skilled Physical Therapy services for generalized conditioning, gross BLE strengthening and exercises to improve cardiovascular endurance. During the PT evaluation, patient was able to ambulate 1,337 ft during a , however he reported a 6RPE on the modified BORG's scale, indicating moderately hard exertion while ambulating. Patient is unable to maintain static standing balance with vision-denied, and is unable to independently navigate compliant surfaces. He will benefit from neuromuscular reeducation to improve balance. Patient is highly motivated to return to [facility] and will benefit from skilled physical therapy.      Today's session focused on progressing pulmonary rehab with emphasis on cardiovascular endurance and LE strengthening. Patient demonstrated limited endurance today secondary to fatigue. He is able to navigate compliant surfaces with vision-denied without experiencing losses of balance or reports of feeling off balance. Next session: progress aerobic training and BLE strengthening exercises    Personal Factors/Comorbidities Present:  limited walking endurance, limited standing balance with vision-denied, shortness of breath with exercise, limited BUE AROM/strength due to sternal precautions, DM II, history of colon and liver cancers    Examination of Body Systems: cardiovascular, neuromuscular, musculoskeletal    Long Term Goals:  In 8 weeks:  1.   Ambulate 2,000 feet in 6 minutes for improved functional activity tolerance.    2.   Increase exercise tolerance to 30 minutes of sitting/standing exercise with modified BORG score of 3.    3.   Demonstrate decreased SOB with activity as demonstrated by a SOBQ score of 10% for improved quality of life.    4.   Patient will be independent with home program and/or transition into community exercise program.      Prognosis for goal achievement:  Good due to good motivation, supportive family, overall health status and motivation.    PLAN:   Treatment Plan and Educational Needs (check all that apply):  x Cough techniques    Acapella/Vest    Postural drainage/Percussion   x Breathing retraining   x Postural exercises    Footwear education   x General conditioning   x Progressive ambulation   x Strengthening   x Stretching   x Home exercise program   x Benefits of exercise    Lung anatomy and physiology/pathology    Infection prevention/treatment    Psychosocial education/support group    Breathing/relaxation techniques    Airway clearance    Healthy eating    Smoking cessation    Other        Pt. will participate in the following as needed:    x Manual Therapy   x  Therapeutic Activities   x Therapeutic Exercise   x Neuromuscular Education    Self Care   x Postural Exercises/education    Unattended estim    taping    Scar massage    Therapeutic Dry Needling    Hot/Cold modalities    Cold Laser    Education       Planned frequency of treatment:  3x / week    Planned duration of treatment: 8 weeks      SUBJECTIVE:  Reports having difficulty sleeping secondary to chest discomfort    Reason for Referral/History of Present Condition/Onset of injury/exacerbation:   70 y.o.  male presents to outpatient physical therapy for pulmonary rehab status post lung transplant.   Per Dr. Venancio Poisson on 05/28/2020,  Jama Krichbaum is a 70 y.o. 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 [facility] ECMO time was 192 minutes and patient was successfully weaned off ECMO intra-op. Bilateral apical and basilar chest tubes inserted intra-op and placed to suction. He tolerated the procedure well and was transferred to the CT ICU while intubated for continued post-operative management given critical condition. Patient was successfully extubated on POD 1 and placed on HFNC.   ??Bilateral apical chest tubes removed on POD 4 and post-pull CXR without evidence of pneumothorax. Patient with interval development of Right sided subcutaneous emphysema on POD 4, which improved with bilateral basilar chest tubes to continuous suction. Bilateral basilar CTs placed to WS on POD 8 and removed on POD 9 and POD 10 with unremarkable post-pull CXR.   Epidural catheter removed on POD 2. NGT removed on POD 4 and diet advanced following unremarkable modified barium swallow study on 1/10. Patient successfully passed TOV on POD 7.   He did well postoperatively. Diet was slowly advanced and at the time of discharge, patient was tolerating a regular diet.??The patient was able to void spontaneously, have pain controlled with P.O. pain medication. Patient was evaluated by the Thoracic surgery service and all consultants and was found to be fit for discharge. Patient will be discharged home on Hospital Day 13?? in stable condition. Patient received teaching prior to discharge and appropriate post-op follow-up scheduled.    Home Environment:  Patient lives with wife in single story home with one step to enter.    Oxygen Use:   None at this time    Pulmonary Rehab History/ Home exercise:  Sit to stand for 30 seconds, 2x/day  Continue current walking regimen    Current Pain:  0/10 discomfort in his chest    Patient Goals:  Improve strength, Improve ambulation, Return to recreational activities, Return to work and Improve overhead reaching    OBJECTIVE :    Cough: occasional    Airway Clearance Techniques/Devices: N/A    Range of Motion Deficits: BUE limited by sternal precautions and tightness related to incisions    Strength Deficits: BUE and BLE strength WFL    Posture/skin/other: N/A    Gait Analysis:  decreased gait speed  decreased terminal hip ext on bilaterally    Functional Test/Outcome Measures:  30 second chair stand 18 repetitions    6 MWT distance 1,337 ft, sp02 98, HR 120s, AD used none at this time    SOBQ score 12%    Treatment Rendered:    Total Treatment Time: 40 min  Therapeutic Exercise: 30 min  Vitals: HR 106bpm, SpO2 97%, BP 132/71 mmHg    Nustep, level 3, 15 min, arms at level 11: 1 mile, 23 min.   Vitals: HR 110-120sbpm   SpO2: >96%  7 heads, eyes closed, normal stance, air-ex, 20 count  Step ups/downs, 1x10.      HR 127, SpO2 99%      Communication/consultation with other professionals:  medicare Cert/POC sent to referring practitioner  Initial note sent to referring practitioner      Equipment provided/recommended:   N/A     HEP:  Tries to walk 2000' inside his house daily.  Incentive spirometer  Sit to stand from chair  Standing heel lifts 2 x20  Standing hip abduction and extension 2 x10-15  Standing mini-squat 2 x10  Feet together, eyes closed, 7 heads with 10-sec hold    I attest that I have reviewed the above information.  Signed: Tona Sensing, PT, DPT  06/25/2020 12:46 PM

## 2020-06-26 ENCOUNTER — Encounter: Admit: 2020-06-26 | Discharge: 2020-06-26 | Payer: MEDICARE | Attending: Anesthesiology | Primary: Anesthesiology

## 2020-06-26 ENCOUNTER — Encounter: Admit: 2020-06-26 | Discharge: 2020-06-26 | Payer: MEDICARE

## 2020-06-26 MED ADMIN — lactated Ringers infusion: 10 mL/h | INTRAVENOUS | @ 14:00:00 | Stop: 2020-06-26

## 2020-06-26 MED ADMIN — propofoL (DIPRIVAN) injection: INTRAVENOUS | @ 14:00:00 | Stop: 2020-06-26

## 2020-06-26 MED ADMIN — propofol (DIPRIVAN) infusion 10 mg/mL: INTRAVENOUS | @ 14:00:00 | Stop: 2020-06-26

## 2020-06-26 MED ADMIN — lidocaine (XYLOCAINE) 20 mg/mL (2 %) injection: INTRAVENOUS | @ 14:00:00 | Stop: 2020-06-26

## 2020-06-27 ENCOUNTER — Encounter: Admit: 2020-06-27 | Discharge: 2020-06-28 | Payer: MEDICARE

## 2020-06-27 DIAGNOSIS — R9389 Abnormal findings on diagnostic imaging of other specified body structures: Principal | ICD-10-CM

## 2020-06-27 DIAGNOSIS — Z942 Lung transplant status: Principal | ICD-10-CM

## 2020-06-27 LAB — CBC W/ AUTO DIFF
BASOPHILS ABSOLUTE COUNT: 0 10*9/L (ref 0.0–0.1)
BASOPHILS RELATIVE PERCENT: 0.6 %
EOSINOPHILS ABSOLUTE COUNT: 0.1 10*9/L (ref 0.0–0.4)
EOSINOPHILS RELATIVE PERCENT: 2.3 %
HEMATOCRIT: 40.8 % — ABNORMAL LOW (ref 41.0–53.0)
HEMOGLOBIN: 12.6 g/dL — ABNORMAL LOW (ref 13.5–17.5)
LARGE UNSTAINED CELLS: 1 % (ref 0–4)
LYMPHOCYTES ABSOLUTE COUNT: 0.7 10*9/L — ABNORMAL LOW (ref 1.5–5.0)
LYMPHOCYTES RELATIVE PERCENT: 12.5 %
MEAN CORPUSCULAR HEMOGLOBIN CONC: 30.8 g/dL — ABNORMAL LOW (ref 31.0–37.0)
MEAN CORPUSCULAR HEMOGLOBIN: 30.5 pg (ref 26.0–34.0)
MEAN CORPUSCULAR VOLUME: 99 fL (ref 80.0–100.0)
MEAN PLATELET VOLUME: 8.4 fL (ref 7.0–10.0)
MONOCYTES ABSOLUTE COUNT: 0.2 10*9/L (ref 0.2–0.8)
MONOCYTES RELATIVE PERCENT: 3.1 %
NEUTROPHILS ABSOLUTE COUNT: 4.7 10*9/L (ref 2.0–7.5)
NEUTROPHILS RELATIVE PERCENT: 80.6 %
PLATELET COUNT: 240 10*9/L (ref 150–440)
RED BLOOD CELL COUNT: 4.13 10*12/L — ABNORMAL LOW (ref 4.50–5.90)
RED CELL DISTRIBUTION WIDTH: 17.8 % — ABNORMAL HIGH (ref 12.0–15.0)
WBC ADJUSTED: 5.8 10*9/L (ref 4.5–11.0)

## 2020-06-27 LAB — POSACONAZOLE: POSACONAZOLE LEVEL: 992 ng/mL

## 2020-06-27 LAB — COMPREHENSIVE METABOLIC PANEL
ALBUMIN: 3.4 g/dL (ref 3.4–5.0)
ALKALINE PHOSPHATASE: 80 U/L (ref 46–116)
ALT (SGPT): 54 U/L — ABNORMAL HIGH (ref 10–49)
ANION GAP: 7 mmol/L (ref 5–14)
AST (SGOT): 24 U/L (ref <=34)
BILIRUBIN TOTAL: 0.4 mg/dL (ref 0.3–1.2)
BLOOD UREA NITROGEN: 33 mg/dL — ABNORMAL HIGH (ref 9–23)
BUN / CREAT RATIO: 26
CALCIUM: 9.7 mg/dL (ref 8.7–10.4)
CHLORIDE: 100 mmol/L (ref 98–107)
CO2: 28 mmol/L (ref 20.0–31.0)
CREATININE: 1.28 mg/dL — ABNORMAL HIGH
EGFR CKD-EPI AA MALE: 66 mL/min/{1.73_m2} (ref >=60)
EGFR CKD-EPI NON-AA MALE: 57 mL/min/{1.73_m2} — ABNORMAL LOW (ref >=60)
GLUCOSE RANDOM: 113 mg/dL — ABNORMAL HIGH (ref 70–99)
POTASSIUM: 4.6 mmol/L — ABNORMAL HIGH (ref 3.4–4.5)
PROTEIN TOTAL: 6.6 g/dL (ref 5.7–8.2)
SODIUM: 135 mmol/L (ref 135–145)

## 2020-06-27 LAB — MAGNESIUM: MAGNESIUM: 1.7 mg/dL (ref 1.6–2.6)

## 2020-06-27 LAB — TACROLIMUS LEVEL, TROUGH: TACROLIMUS, TROUGH: 8.7 ng/mL (ref 5.0–15.0)

## 2020-06-27 LAB — PHOSPHORUS: PHOSPHORUS: 2.8 mg/dL (ref 2.4–5.1)

## 2020-06-27 LAB — IGG: GAMMAGLOBULIN; IGG: 587 mg/dL — ABNORMAL LOW (ref 646–2013)

## 2020-06-27 MED ORDER — INSULIN ASPART (U-100) 100 UNIT/ML (3 ML) SUBCUTANEOUS PEN
2 refills | 0 days | Status: CP
Start: 2020-06-27 — End: ?
  Filled 2020-07-04: qty 15, 37d supply, fill #0

## 2020-06-27 MED ORDER — ASPIRIN 81 MG TABLET,DELAYED RELEASE
ORAL_TABLET | Freq: Every day | ORAL | 3 refills | 90 days | Status: CP
Start: 2020-06-27 — End: 2021-06-27

## 2020-06-27 MED ORDER — INSULIN GLARGINE (U-100) 100 UNIT/ML (3 ML) SUBCUTANEOUS PEN
Freq: Every evening | SUBCUTANEOUS | 3 refills | 75 days | Status: CP
Start: 2020-06-27 — End: 2021-06-27

## 2020-06-27 NOTE — Unmapped (Signed)
Called [name] to discuss lab results. Tacrolimus level was normal at 8.7 with a goal of 8-10. Medication regimen is currently 0.5 mg BID. Dosing at this time to remain the same. Repeat labs scheduled one week. Resulted labs reviewed. All questions answered. Pt verbalized understanding.

## 2020-06-27 NOTE — Unmapped (Signed)
In with Dr. Glenard Haring to see Spencer Pena. Doing well, PFTs slightly down today, will repeat next week.  Starting aspirin, increasing insulin today.  Return to clinic one week. Questions answered, verbalized understanding.

## 2020-06-27 NOTE — Unmapped (Signed)
Pulmonary Transplant Clinic    HISTORY:   HPI:  70 y.o. 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, also with type 2 diabetes mellitus, hypertension, colon cancer in 2007 s/p partial colon and hepatic wedge resection and FOLFOX here for f/u:    Last seen in clinic on 06/20/20, at that time on imaging possible effusion which has been seen before - Seen by IP on 06/20/20 - on ultrasound no pleural effusion seen. PFTs down slightly today, but generally feels that breathing is good, maybe wasn't able to blow as well today. Does have a bit of a cough with a bit of mucus, stable, no changes.     Participating in PT - Working on getting stronger, feels better once moving around  Feels fatigued during the day, wondering about iron levels (have previously been low)  Sleeps well first couple of hours then cat naps; Mild AM headaches, nothing major. Naps during day but doesn't fall asleep when not intending to    Had EGD 06/26/20, which showed normal esophagus, erosive gastropathy without recent bleeding (which was biopsied) and normal duodenum    Sugars remain elevated lantus 18, meal time 7/7/4 + SSI, working on appetite and sugars higher    BPs good at home, HR elevated, not currently on any BP meds  Edema not a big issue, not currently on lasix    Creatinine today slightly improved    Diarrhea improved    PH probe in March scheduled    Some burning/indigestion one morning when he woke up; took some tylenol and resolved; no dysphagia, odynophagia, regurgitation    No fevers or chills      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     --> basiliximab induction    --> CMV high risk (D+/R-)    --> EBV moderate risk (D+/R+)    --> Not Increased Risk Donor  - Colon Cancer 2007    --> s/p resection of liver mets    --> s/p resection of 6 inches of colon    --> FOLFOX Chemo  - type 2 diabetes mellitus     --> not insulin dependent  - OA    SOCIAL HISTORY:   - 20pk years, quit in 1991  - drinks about 10 drinks a week but will stop  - Works maintenance at [employer]  - Married and lives in [place]    FAMILY HISTORY:   - Parents lived into late 90's    MEDS:  (personally reviewed in EPIC, pertinent meds noted below)     OBJECTIVE DATA:   PHYSICAL EXAM:  BP 121/66  - Pulse 103  - Temp 36.6 ??C (Tympanic)  - Ht 167.6 cm (5' 6)  - Wt 72.1 kg (159 lb)  - SpO2 98%  - BMI 25.66 kg/m??      Gen: - awake, alert, in NAD   Derm: - no rash   HEENT: - no adenopathy, TMs clear, MMM, no cervical LAD  Vascular: - good pulses throughout, no JVP   CV: - RRR, no murmers or gallops   Pulm: - CTA bilaterally   Incision: - CDI, OTA  Abd: - soft, NT, ND, +BS, no hepatosplenomegally   Ext: - no edema, no clubbing.   Joints: - no enlarged joints, no warmth, redness or effusions   Neuro: - CN grossly intact, nl gait     LABS: (reviewd in Epic, pertinent values noted below)  Lab Results   Component Value Date    WBC 5.8 06/27/2020    HGB 12.6 (L) 06/27/2020    HCT 40.8 (L) 06/27/2020    PLT 240 06/27/2020       Lab Results   Component Value Date    NA 135 06/27/2020    K 4.6 (H) 06/27/2020    CL 100 06/27/2020    CO2 28.0 06/27/2020    BUN 33 (H) 06/27/2020    CREATININE 1.28 (H) 06/27/2020    GLU 113 (H) 06/27/2020    CALCIUM 9.7 06/27/2020    MG 1.7 06/27/2020    PHOS 2.8 06/27/2020       Lab Results   Component Value Date    BILITOT 0.4 06/27/2020    BILIDIR 0.10 05/28/2020    PROT 6.6 06/27/2020    ALBUMIN 3.4 06/27/2020    ALT 54 (H) 06/27/2020    AST 24 06/27/2020    ALKPHOS 80 06/27/2020    GGT 19 05/15/2020       Lab Results   Component Value Date    PT 13.4 05/17/2020    INR 1.15 05/17/2020    APTT 23.9 (L) 05/15/2020         PFTs:  (personally reveiewed and interpreted):  Date: FVC (% Pred) FEV1 (% Pred) FEF25-75(% Pred) DLCO TBBx/Results                   06/27/2020 3.13 84.5 2.70 95.4 3.51 157.8     06/20/20 3.39 (91.4%) 2.82 (99.2%) 3.34 (150.35)     06/13/20 3.10 (72.8%) 2.66 (82.5%) 3.34 (136.2%)      05/15/20 BOLT    03/11/20  2.75 (72%)  2.36 (81%)       02/12/20  2.85 (75%)  2.44 (86%)                IMAGING: (Personally reviewed, our interpretation is below)  Small right and trace left pleural effusions with adjacent compressive atelectasis. No definite focal consolidation. Stable from prior.     ASSESSMENT and PLAN   70 y.o. 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, also with type 2 diabetes mellitus, hypertension, colon cancer in 2007 s/p partial colon and hepatic wedge resection and FOLFOX here for follow up    Graft Function and Immunosuppression:  - PFTs down today, CXR with persistent R hemidiaphragm elevation on lateral view (previously queried possible effusion and none seen on ultrasound) - will repeat PFTs in 1 week and if still down will plan for CT and bronch (was due for biopsy on 06/15/20, but held off for age and good PFTs at that time)  - s/p IVIg on 01/08 and 01/18, total IgG 587 06/27/20   - HLA status: No DSAs, 06/13/20 HLA testing with DPB1:1 but report comments on DP1 bead known false reactivity) - discussed with HLA lab that this is definitely false positive   - s/p basiliximab induction  - Tacro dose: 1/0.5, Goal: 8-10 (lower for age)  - Imuran 100 mg daily; Stopped MMF 02/04 (GI upset)  - Prednisone Dose: 20mg  daily  - Azithro and prava for BOS ppx, add aspirin 81 mg daily    Antimicrobial Prophylaxis:  - CMV, high risk (D+/R-): valganciclovir 900mg  daily for Estimated Creatinine Clearance: 49.2 mL/min (A) (based on SCr of 1.28 mg/dL (H)). Negative 06/20/20; follow up CMV VL 06/27/20  - EBV, (D+/R+): negative 06/20/20; follow up EBV VL 06/27/20  - Fungal: Posaconazole 300mg  daily, last level  1715 (05/21/20); follow up level 06/27/20  - PCP: Bactrim SS MWF   - tamiflu during flu season    Pain control:  - Moderately well controlled  - Continue gabapentin 100mg  TID  - Continue scheduled tylenol    Loose stools:  - Improved with switch from MMF to imuran, now resolved  - No associated urgency or stomach cramping/pain.  - Likely medication related.  - GI pathogen panel negative, Cdiff negative.      Possible OSA:   - Noted to have desaturations to 88% overnight. Reportedly snores.  - Will need outpatient overnight oximetry to evaluate    GERD   - studies ordered, normal esophagus but erosive gastropathy on EGD, biopsies pending  - Continue protonix??    BPH  - Increased terazosin to 2mg  nightly to help symptoms and BP, and symptoms have resolved    Blood Pressure Management:  - Home BPs stable  - Not currently on therapy    Kidney Function:  - Cr baseline 0.8-1.13  - Cr improved to 1.28 today 06/27/20  - Lasix on hold, no increase in LE edema    Iron Deficiency Anemia:   - received IV iron 05/22/20, ongoing fatigue, ctm  ??  Antiarrythmic Prophylaxis:   - finished amiodarone without arrhythmia.     Type 2 diabetes mellitus   - metformin + insulin, increasing lantus to 20, mealtime to 8/8/4 - will have him start to check nighttime sugars  - Estimated Creatinine Clearance: 49.2 mL/min (A) (based on SCr of 1.28 mg/dL (H)).  - Blood sugars remain elevated    Bone Health: Last Dexa: Oct 2021, no osteopenia or osteoporosis  - on Ca and Vit D supplementation currently, last Vit D 27 on 05/24/20    Health Maintenance:  - Last derm visit:  Pre-txp  - Last Colonsocopy (if >40): 12/2016 Diverticulosis and internal hemorrhoids  - Last flu shot:   02/08/2020  - Last Pneumovax:  06/10/2004, 12/30/2017  - Last Prevnar:  03/25/2016  - TdaP:   02/11/2018  - Shingrix:    03/10/20, Due #2  - COVID-19 (Moderna) 06/11/19, 07/09/19, 03/10/20, got Evushield 06/20/20; will be due next week   - Last Dexa:   02/2020  - Last vitamin D:  27 (05/24/2020)  - Last HbA1c:   6.5 (05/15/2020)    Immunization History   Administered Date(s) Administered   ??? COVID-19 VACCINE,MRNA(MODERNA)(PF)(IM) 06/11/2019, 07/09/2019, 03/10/2020   ??? HEPB-CPG,ADULT DOSAGE (2 DOSE SCHEDULE)ADJUVANTED, IM 03/12/2020   ??? INFLUENZA INJ MDCK PF, QUAD,(FLUCELVAX)(30MO AND UP EGG FREE) 02/08/2020   ??? INFLUENZA TIV (TRI) PF (IM) 02/27/2010   ??? Influenza Vaccine Quad (IIV4 PF) 6mo+ injectable 01/19/2019   ??? Influenza Virus Vaccine, unspecified formulation 02/07/2013, 02/28/2015, 02/05/2016, 02/02/2017, 01/27/2018   ??? PNEUMOCOCCAL POLYSACCHARIDE 23 06/10/2004, 12/30/2017   ??? Pneumococcal Conjugate 13-Valent 03/25/2016   ??? SHINGRIX-ZOSTER VACCINE (HZV), RECOMBINANT,SUB-UNIT,ADJUVANTED IM 03/10/2020   ??? TdaP 02/15/2012, 02/11/2018         Patient will return to clinic in 1 week    >59min was spent with the patient face to face and >45min was spent reviewing chart/imaging.     Complex medical decision making was done as we adjust medications based on blood work/drug levels and multiple complaints and problems were addressed    The patient was assessed and discussed with Dr. Glenard Haring who is in agreement with plan.

## 2020-06-27 NOTE — Unmapped (Signed)
See Pharmacy encounter in clinic this day for additional documentation.

## 2020-06-27 NOTE — Unmapped (Signed)
[facility] CLINIC PHARMACY NOTE  06/27/2020   Spencer HATFIELD Sr.  [id number]    Medication changes today:   1. INCREASE Lantus to 20 units daily  2. INCREASE Novolog to 8/8/4 + SSI, 2 units for every 50>150  3. START aspirin 81 mg PO daily  4. Continue to hold Lasix    Education/Adherence tools provided today:  1. provided updated medication list  2. provided additional pill box education    Follow up items:  1. BG control with insulin adjustments - will have him begin checking BGs at bedtime  2. Renally adjust Tamiflu and Valcyte dosing if renal function worsens  3. Posaconazole level pending today    Next visit with pharmacy in 1-2 weeks  ____________________________________________________________________      Spencer Mallet Sr. is a 70 y.o. 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     Other PMH significant for hypertension, T2DM, colon cancer s/p hemicolectomy and R hepatectomy for metastasis + chemotherapy (2007), BPH    Seen by pharmacy today for: pill box assessment and adherence education and medication management    CC:  Patient has no complaints today.         BP 121/66  06/27/2020   Pulse 103  06/27/2020   Resp 17  06/26/2020   Temp 36.6 ??C (97.9 ??F)  06/27/2020   SpO2 98 %  06/27/2020   Weight 72.1 kg (159 lb)  06/27/2020   Height 1.676 m (5' 5.98)  06/27/2020   BMI (Calculated) 25.68  06/27/2020       Allergies   Allergen Reactions   ??? Cetuximab Anaphylaxis     Chest pain/rapid heart rate/ BP dropped   ??? Nsaids (Non-Steroidal Anti-Inflammatory Drug)      Interaction with transplant medications   ??? Venom-Honey Bee Anaphylaxis   ??? Enalapril      Angioedema     ??? Pollen Extracts      Medications reviewed in EPIC medication station and updated today by the clinical pharmacist practitioner. Medication list includes revisions made during today???s encounter.    Outpatient Encounter Medications as of 06/27/2020   Medication Sig Dispense Refill   ??? acetaminophen (TYLENOL EXTRA STRENGTH) 500 MG tablet Take 2 tablets (1,000 mg total) by mouth Three (3) times a day. 180 tablet 0   ??? aspirin (ECOTRIN) 81 MG tablet Take 1 tablet (81 mg total) by mouth daily. 90 tablet 3   ??? azaTHIOprine (IMURAN) 50 mg tablet Take 2 tablets (100 mg total) by mouth daily. 60 tablet 11   ??? azithromycin (ZITHROMAX) 250 MG tablet Take 1 tablet (250 mg total) by mouth daily. 30 tablet 11   ??? blood sugar diagnostic (ON CALL EXPRESS TEST STRIP) Strp Test daily before all meals/snacks and once before bedtime. 100 each 11   ??? calcium carbonate (CALCIUM 600) 1,500 mg (600 mg elem calcium) tablet Take 1 tablet (600 mg of elem calcium total) by mouth Two (2) times a day. 60 tablet 11   ??? cholecalciferol, vitamin D3 25 mcg, 1,000 units,, 1,000 unit (25 mcg) tablet Take 2 tablets (50 mcg total) by mouth daily. 100 tablet 11   ??? gabapentin (NEURONTIN) 100 MG capsule Take 1 capsule (100 mg total) by mouth Three (3) times a day. 90 capsule 2   ??? insulin ASPART (NOVOLOG FLEXPEN U-100 INSULIN) 100 unit/mL (3 mL) injection pen Inject 8 units with breakfast, 8 units with afternoon meal, and 4 units with evening snack  plus sliding scale 2 units for every 50 >150; max 40 units/day 15 mL 2   ??? insulin glargine (BASAGLAR, LANTUS) 100 unit/mL (3 mL) injection pen Inject 0.2 mL (20 Units total) under the skin nightly. 15 mL 3   ??? lancets 30 gauge Misc Test daily before all meals/snacks and once before bedtime. 100 each 11   ??? magnesium oxide (MAG-OX) 400 mg (241.3 mg elemental magnesium) tablet Take 2 tablets (800 mg total) by mouth Two (2) times a day. 120 tablet 11   ??? metFORMIN (GLUCOPHAGE) 1000 MG tablet Take 1 tablet (1,000 mg total) by mouth Two (2) times a day. 60 tablet 11   ??? oseltamivir (TAMIFLU) 75 MG capsule Take 1 capsule (75 mg total) by mouth daily. 20 capsule 2   ??? pantoprazole (PROTONIX) 40 MG tablet Take 1 tablet (40 mg total) by mouth Two (2) times a day. 60 tablet 11   ??? pen needle, diabetic (BD ULTRA-FINE NANO PEN NEEDLE) 32 gauge x 5/32 (4 mm) Ndle Use one new pen needle under the skin Three (3) times a day as directed 200 each 2   ??? posaconazole (NOXAFIL) 100 mg delayed released tablet Take 3 tablets (300 mg total) by mouth daily. 21 tablet 3   ??? pravastatin (PRAVACHOL) 40 MG tablet Take 1 tablet (40 mg total) by mouth daily. 30 tablet 11   ??? predniSONE (DELTASONE) 5 MG tablet Take 4 tablets (20 mg total) by mouth daily. 120 tablet 11   ??? sulfamethoxazole-trimethoprim (BACTRIM) 400-80 mg per tablet Take 1 tablet (80 mg of trimethoprim total) by mouth Every Monday, Wednesday, and Friday. 36 tablet 3   ??? tacrolimus (PROGRAF) 0.5 MG capsule Take 1 capsule (0.5 mg total) by mouth two (2) times a day. 180 capsule 3   ??? terazosin (HYTRIN) 1 MG capsule Take 2 capsules (2 mg total) by mouth nightly. 60 capsule 11   ??? valGANciclovir (VALCYTE) 450 mg tablet Take 2 tablets (900 mg total) by mouth daily. 60 tablet 11   ??? [DISCONTINUED] insulin ASPART (NOVOLOG FLEXPEN U-100 INSULIN) 100 unit/mL (3 mL) injection pen Inject 7 units with breakfast, 7 units with afternoon meal, and 4 units with evening snack plus sliding scale 2 units for every 50 >150; max 30 units/day 15 mL 2   ??? [DISCONTINUED] insulin glargine (BASAGLAR, LANTUS) 100 unit/mL (3 mL) injection pen Inject 0.18 mL (18 Units total) under the skin nightly. 15 mL 3     No facility-administered encounter medications on file as of 06/27/2020.       Immunosuppression:  Induction agent: basiliximab    Current immunosuppression:  ??? tacrolimus 0.5 mg PO qAM + 0.5 mg qPM  o Prograf goal: 8-10  ??? Azathioprine 100 mg daily - changed from MMF 2/2 diarrhea  ??? prednisone 20 mg daily    Patient is tolerating immunosuppression well. Previous diarrhea has resolved.    IMMUNOSUPPRESSION DRUG LEVELS:  Lab Results   Component Value Date    Tacrolimus, Trough 8.7 06/27/2020    Tacrolimus, Trough 8.4 06/20/2020    Tacrolimus, Trough 12.3 06/13/2020     Tacrolimus level is accurate 12 hour trough, last dose yesterday at 21:00.    Graft function assessment via PFTs: worsening slightly from previous study  DSA: last checked 06/13/20 - NTD  Biopsies to date: none  WBC/ANC:  wnl    Plan: Will maintain current immunosuppression; tacrolimus level pending today.     OI prophylaxis:   CMV Status: D+/ R-, high  risk. CMV prophylaxis with valganciclovir 900 mg daily x 12 months per protocol.  Estimated Creatinine Clearance: 49.2 mL/min (A) (based on SCr of 1.28 mg/dL (H)).  PCP: Prophylaxis with bactrim SS 1 tab MWF indefinitely.  Fungal: Currently on posaconazole 300 mg daily for prophylaxis x 3 months per protocol.  ?? Last trough: 1117 (06/13/20)  ?? Goal: > 700 ng/ml   Flu (seasonal): oseltamivir (Tamiflu) 75 mg daily    Patient is tolerating infectious prophylaxis well. Renal function continues to improve.     Plan: Continue per protocol. Consider dose adjusting Tamiflu and Valcyte at future visit if renal function worsens.    Infectious History:  Patient does not have a history of pathogenic infectious complications following lung transplant. 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 Previous antiinfective history use includes meropenem x7 days for broad spectrum gram negative and aspiration PNA coverage post-op per ICID.     Plan: Continue to monitor.    BOS prophylaxis:  Current meds include: azithromycin 250 mg daily and pantoprazole 40 mg BID  Pt denies reflux/heartburn. Esophageal motility study scheduled for 07/23/20. Endoscopy on 06/26/20. Normal findings with biopsy results pending.    Plan: Continue to monitor. Plan to hold PPI for five days prior to motility study.     CV Prophylaxis:   Aspirin: not on currently  The 10-year ASCVD risk score Denman George DC Jr., et al., 2013) is: 23.1%  Statin: pravastatin; currently on 40 mg daily.    Plan: Start aspirin 81 mg PO daily.     BP/Edema: Goal < 140/90. Encounter vitals reported above.  Home BP ranges: 100s-130s/60-70s mmHg; HR 90s-100s  Weights: ranged from 152.6 to 157.2 lbs  Current meds include: terazosin 2 mg at bedtime for BPH    Plan: BP within goal per home readings. SCr has continued to decrease over last visits. Continue to hold Lasix.     Anemia:  H/H:   Lab Results   Component Value Date    HGB 12.6 (L) 06/27/2020     Lab Results   Component Value Date    HCT 40.8 (L) 06/27/2020     Iron panel:  Lab Results   Component Value Date    IRON 21 (L) 05/19/2020    TIBC 226 (L) 05/19/2020    FERRITIN 88.6 05/19/2020     Lab Results   Component Value Date    Iron Saturation (%) 9 05/19/2020     Prior anemia therapy: IV Ferrlecit (1/10-1/13)    Plan: H/H improved since discharge. Continue to monitor.     DM:   Lab Results   Component Value Date    A1C 6.0 (H) 06/13/2020   Goal A1c < 7%.  Current meds include:  ?? Insulin Glargine 18 units daily in AM  ?? Novolog SSI 2:50>150  ?? Novolog units 7/7/4  ?? Metformin 1000 mg PO BID  ?? HOLDING home glipizide  Home BS log:   Breakfast  Lunch  Dinner  HS   Date Fasting PC AC PC AC PC    06/20/20 229  151  235     06/21/20 167  276  189     06/22/20 127  310  268     06/23/20 172  227  236     06/24/20 170  266  140     06/25/20 174  303  196     06/26/20 202  232  165     06/27/20 165  Diet: Appetite is improving and he is eating more since last visit. Most meals involve a meat and a potato side.   Exercise: walking 100 ft distance 10-20 times every day  Hypoglycemia: no  Plan: Majority of BGs above goal and stable from last visit. Max BG at last visit in the low 400s, now in the low 300s. Will increase Lantus to 20 units nightly, and increase Novolog to 8/8/4 + SSI. Will have patient start checking bedtime BGs to gain further information regarding trends in the evening/nighttime.    Electrolytes: wnl  Current meds include: MgOx 800 mg BID    Plan: Continue    GI/BM: Diarrhea and GI distress have resolved. Denies nausea or abdominal cramping.  Current meds include: none; previously on ondansetron, Miralax, and senna    Plan: Continue to monitor.     Pain: Pain well controlled with scheduled APAP and gabapentin. ,   Current meds include: gabapentin 100 mg TID, APAP 1000 mg TID    Plan: continue gabapentin 100 mg TID and APAP 1000 mg TID    Bone health:  Patient currently on long-term steroid therapy.  Vitamin D Level: 27.0 (05/24/20). Goal > 30.   Last DEXA results (02/2020 pre-transplant): normal bone density  Current meds include: vitamin D3 2000 units daily, calcium carbonate 600 mg BID    Plan: Continue to monitor.     Women's/Men's Health:  Welby Montminy. is a 70 y.o. male. Patient reports history of BPH. Reports that BPH symptoms are controlled.  Current meds include: terazosin 2 mg PO nightly    Plan: Continue terazosin to 2mg  qHS . Continue to monitor.    Adherence:   Patient has poor understanding of medications.  Patient does not fill their own pill box on a regular basis at home. Wife currently fills.  Patient brought medication card: yes  Pill box: was correct  Patient requested refills for the following meds: None  Corrections needed in Epic medication list: none     Plan: Provided moderate adherence counseling/intervention    I spent a total of 20 minutes face to face with the patient delivering clinical care and providing education/counseling.    Patient was reviewed with Dr.Coakley who was agreement with the stated plan.    During this visit, the following was completed:   BG log data assessment  BP log data assessment  Labs ordered and evaluated  complex treatment plan >1 DS   Patient education was completed for 25-60 minutes     All questions/concerns were addressed to the patient's satisfaction.  __________________________________________  PATIENT SEEN AND EVALUATED BY:    Erin E. Manson Passey, PharmD, BCPS  PGY2 Pharmacy Resident, Pharmacotherapy    Marielis Samara TEETER Lakyra Tippins, PHARMD, BCPS, CPP  [employer]ID [employer] TRANSPLANT PHARMACIST PRACTITIONER  PAGER [phone]

## 2020-06-28 NOTE — Unmapped (Signed)
LUNG TRANSPLANT SOCIAL WORK NOTE      Spencer Pena says things are going pretty well. He is starting to do a little more for himself, and is eager to do more. (He particularly wants to drive.) He doesn't like having to depend on others. He says rehab is going OK. He and wife Spencer Pena got mixed up about lung transplant class dates and so missed the last two, and canceled a PT visit they thought conflicted. We've gotten it straight now.    Spencer Pena seems a bit stressed. Her mom is having more health issues and isn't able to prepare simple meals anymore, which was a help. Spencer Pena is worried about getting all of Spencer Pena's medications right and stressed by trying to get home from appointments to cook for him, saying he has to eat at certain times. Emotional support offered.    Asked Spencer Pena to complete depression and anxiety screens:  PHQ-9 Score:  PHQ-9 TOTAL SCORE: 6    GAD-7 Score:  GAD-7 Total Score: 0    His biggest symptom of depression is feeling tired or having little energy, more than half of the days. We talked about that also being a normal feeling so soon after such a major surgery. He isn't worried about his mood and so it isn't causing him any difficulties in taking care of things or getting along with other people. He knows that we are available to help, with counseling, medication, or both, should his mood start to bother him and negatively impact his life.    Met with patient in an exam room in the [facility].  He was wearing a mask for the duration of our interaction. His wife wore a mask during the entire visit. I wore personally-acquired N-95 mask and wrap-around eye protection and was within 6 feet of the patient/visitor during this interaction.     [name], LCSW, CCTSW  Transplant Case Manager  June 27, 2020 5:14 PM

## 2020-06-30 NOTE — Unmapped (Signed)
P[facility] Transplant [facility]    HISTORY:   HPI:  70 y.o. 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, also with type 2 diabetes mellitus, hypertension, colon cancer in 2007 s/p partial colon and hepatic wedge resection and FOLFOX here for f/u:    - Feeling OK overall  - Notes appetite is doing better overall  - Early Thursday AM had several bouts of diarrhea, loose and watery  - Then had nausea and vomiting  - T 99.4, this resolved and temp has returned to normal.  - Denies cough, SOB, sputum production, fevers, chills  - Home PFTs increasing.  - Endorses occasional reflux/heartburn, compliant with protonix  - Reflux happens early in the morning when he is on his left side  - pH probe is scheduled for March.  - Denies LE edema  - Home BPs 1teens-130s/60s-80s  - Was gaining weight up until episode Thursday AM.  - Blood sugars are improving, AM close to goal, lunch time and snack are elevated.  - Exercising in pulm rehab doing rowing machine, stepping machine, bridge, balance exercises.        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     --> basiliximab induction    --> CMV high risk (D+/R-)    --> EBV moderate risk (D+/R+)    --> Not Increased Risk Donor  - Colon Cancer 2007    --> s/p resection of liver mets    --> s/p resection of 6 inches of colon    --> FOLFOX Chemo  - type 2 diabetes mellitus     --> not insulin dependent  - OA    SOCIAL HISTORY:   - 20pk years, quit in 1991  - drinks about 10 drinks a week but will stop  - Works maintenance at [employer]  - Married and lives in [place]    FAMILY HISTORY:   - Parents lived into late 90's    MEDS:  (personally reviewed in EPIC, pertinent meds noted below)     OBJECTIVE DATA:   PHYSICAL EXAM:  BP 113/88  - Pulse 103  - Temp 36.2 ??C (97.2 ??F) (Tympanic)  - Ht 167.6 cm (5' 5.98)  - Wt 70.8 kg (156 lb)  - SpO2 99%  - BMI 25.19 kg/m??      Gen: - awake, alert, in NAD   Derm: - no rash   HEENT: - no adenopathy, TMs clear, MMM, no cervical LAD  Vascular: - good pulses throughout, no JVP   CV: - RRR, no murmers or gallops   Pulm: - CTA bilaterally   Incision: - CDI, OTA  Abd: - soft, NT, ND, +BS, no hepatosplenomegally   Ext: - no edema, no clubbing.   Joints: - no enlarged joints, no warmth, redness or effusions   Neuro: - CN grossly intact, nl gait     LABS: (reviewd in Epic, pertinent values noted below)     Lab Results   Component Value Date    WBC 3.3 (L) 07/04/2020    HGB 13.4 (L) 07/04/2020    HCT 42.0 07/04/2020    PLT 222 07/04/2020       Lab Results   Component Value Date    NA 132 (L) 07/04/2020    K 4.1 07/04/2020    CL 101 07/04/2020    CO2 24.0 07/04/2020    BUN 38 (H) 07/04/2020    CREATININE 1.49 (H) 07/04/2020    GLU  112 07/04/2020    CALCIUM 9.2 07/04/2020    MG 1.8 07/04/2020    PHOS 3.0 07/04/2020       Lab Results   Component Value Date    BILITOT 0.3 07/04/2020    BILIDIR 0.10 05/28/2020    PROT 6.4 07/04/2020    ALBUMIN 3.4 07/04/2020    ALT 56 (H) 07/04/2020    AST 45 (H) 07/04/2020    ALKPHOS 62 07/04/2020    GGT 19 05/15/2020       Lab Results   Component Value Date    PT 13.4 05/17/2020    INR 1.15 05/17/2020    APTT 23.9 (L) 05/15/2020         PFTs:  (personally reveiewed and interpreted):  Date: FVC (% Pred) FEV1 (% Pred) FEF25-75(% Pred) DLCO TBBx/Results           07/04/20 3.43 (92.5%) 2.97 (104.7%) 4.26 (191.7%)     06/27/2020 3.13 84.5 2.70 95.4 3.51 157.8     06/20/20 3.39 (91.4%) 2.82 (99.2%) 3.34 (150.35)     06/13/20 3.10 (72.8%) 2.66 (82.5%) 3.34 (136.2%)      05/15/20      BOLT    03/11/20  2.75 (72%)  2.36 (81%)       02/12/20  2.85 (75%)  2.44 (86%)                IMAGING: (Personally reviewed, our interpretation is below)  Small right and trace left pleural effusions with adjacent compressive atelectasis. No definite focal consolidation. Stable from prior.     ASSESSMENT and PLAN   70 y.o. 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, also with type 2 diabetes mellitus, hypertension, colon cancer in 2007 s/p partial colon and hepatic wedge resection and FOLFOX here for follow up    Graft Function and Immunosuppression:  - PFTs increased, CXR pending for today but has had persistent R hemidiaphragm elevation on lateral view (previously queried possible effusion and none seen on ultrasound).   (was due for biopsy on 06/15/20, but held off for age and good PFTs at that time)  - s/p IVIg on 01/08 and 01/18, total IgG 552 07/04/20   - HLA status: No DSAs, 06/13/20 HLA testing with DPB1:1 but report comments on DP1 bead known false reactivity) - discussed with HLA lab that this is definitely false positive   - s/p basiliximab induction  - Tacro dose: 0.5mg  BID, Goal: 8-10 (lower for age)  - Tac was taken prior to labs being drawn today.    - Imuran 100 mg daily; Stopped MMF 02/04 (GI upset)  - Prednisone Dose: 20mg  daily  - Azithro and prava for BOS ppx, aspirin 81 mg daily    Antimicrobial Prophylaxis:  - CMV, high risk (D+/R-): valganciclovir 900mg  daily for Estimated Creatinine Clearance: 48.5 mL/min (A) (based on SCr of 1.28 mg/dL (H)). Negative 06/20/20  - EBV, (D+/R+): negative 06/20/20  - Fungal: Posaconazole 300mg  daily, last level 992 (06/27/20)  - PCP: Bactrim SS MWF   - tamiflu during flu season    Pain control:  - Moderately well controlled  - Continue gabapentin 100mg  TID  - Continue scheduled tylenol    Loose stools:  - Improved with switch from MMF to imuran, now resolved  - No associated urgency or stomach cramping/pain.  - Likely medication related.  - GI pathogen panel negative, Cdiff negative.    Possible OSA:   - Noted to have desaturations to 88% overnight. Reportedly  snores.  - Will need outpatient overnight oximetry to evaluate    GERD   - studies ordered, normal esophagus but erosive gastropathy on EGD, biopsies pending  - pH probe upcoming in March  - Continue protonix??    BPH  - Increased terazosin to 2mg  nightly to help symptoms and BP, and symptoms have resolved    Blood Pressure Management:  - Home BPs stable  - Not currently on therapy    Kidney Function:  - Cr baseline 0.8-1.13  - Cr improved to 1.28 today 06/27/20  - Lasix on hold, no increase in LE edema    Iron Deficiency Anemia:   - received IV iron 05/22/20, ongoing fatigue, ctm  ??  Antiarrythmic Prophylaxis:   - finished amiodarone without arrhythmia.     Type 2 diabetes mellitus   - metformin + insulin, continue lantus 20, increase mealtime to 9/9/4   - Estimated Creatinine Clearance: 48.5 mL/min (A) (based on SCr of 1.28 mg/dL (H)).  - Blood sugars remain elevated    Bone Health: Last Dexa: Oct 2021, no osteopenia or osteoporosis  - on Ca and Vit D supplementation currently, last Vit D 27 on 05/24/20    Health Maintenance:  - Last derm visit:  Pre-txp  - Last Colonsocopy (if >40): 12/2016 Diverticulosis and internal hemorrhoids  - Last flu shot:   02/08/2020  - Last Pneumovax:  06/10/2004, 12/30/2017  - Last Prevnar:  03/25/2016  - TdaP:   02/11/2018  - Shingrix:    03/10/20, Due #2  - COVID-19 (Moderna) 06/11/19, 07/09/19, 03/10/20, got 1/2 dose Evushield 06/20/20; will be due today for 4th COVID vaccine.   - Last Dexa:   02/2020  - Last vitamin D:  27 (05/24/2020)  - Last HbA1c:   6.5 (05/15/2020)    Immunization History   Administered Date(s) Administered   ??? COVID-19 VACCINE,MRNA(MODERNA)(PF)(IM) 06/11/2019, 07/09/2019, 03/10/2020   ??? HEPB-CPG,ADULT DOSAGE (2 DOSE SCHEDULE)ADJUVANTED, IM 03/12/2020   ??? INFLUENZA INJ MDCK PF, QUAD,(FLUCELVAX)(30MO AND UP EGG FREE) 02/08/2020   ??? INFLUENZA TIV (TRI) PF (IM) 02/27/2010   ??? Influenza Vaccine Quad (IIV4 PF) 87mo+ injectable 01/19/2019   ??? Influenza Virus Vaccine, unspecified formulation 02/07/2013, 02/28/2015, 02/05/2016, 02/02/2017, 01/27/2018   ??? PNEUMOCOCCAL POLYSACCHARIDE 23 06/10/2004, 12/30/2017   ??? Pneumococcal Conjugate 13-Valent 03/25/2016   ??? SHINGRIX-ZOSTER VACCINE (HZV), RECOMBINANT,SUB-UNIT,ADJUVANTED IM 03/10/2020   ??? TdaP 02/15/2012, 02/11/2018         Patient will return to clinic in 1 week    >76min was spent with the patient face to face and >42min was spent reviewing chart/imaging.     Complex medical decision making was done as we adjust medications based on blood work/drug levels and multiple complaints and problems were addressed    The patient was assessed and discussed with Dr. Dudley Major who is in agreement with plan.    Sharee Pimple MSN, FNP-C  July 04, 2020 9:16 AM  Lung Transplant Nurse Practitioner  Pager [phone]

## 2020-07-01 DIAGNOSIS — B449 Aspergillosis, unspecified: Principal | ICD-10-CM

## 2020-07-01 LAB — CMV DNA, QUANTITATIVE, PCR: CMV VIRAL LD: NOT DETECTED

## 2020-07-01 LAB — EBV QUANTITATIVE PCR, BLOOD: EBV VIRAL LOAD RESULT: NOT DETECTED

## 2020-07-01 MED ORDER — POSACONAZOLE 100 MG TABLET,DELAYED RELEASE
ORAL_TABLET | Freq: Every day | ORAL | 3 refills | 7.00000 days
Start: 2020-07-01 — End: 2020-07-08

## 2020-07-01 NOTE — Unmapped (Signed)
[facility] Refill Coordination Note    Specialty Medication(s) to be Shipped:   Transplant: Azathioprine 50mg     Other medication(s) to be shipped: oseltamivir 75mg  and novolog     Spencer CAPPELLA Sr., DOB: 11/24/1950  Phone: [phone] (home) [phone] (work)      All above HIPAA information was verified with patient's caregiver, [name]     Was a Nurse, learning disability used for this call? No    Completed refill call assessment today to schedule patient's medication shipment from the [facility] [phone]).       Specialty medication(s) and dose(s) confirmed: Regimen is correct and unchanged.   Changes to medications: Lyman reports no changes at this time.  Changes to insurance: No  Questions for the pharmacist: No    Confirmed patient received Welcome Packet with first shipment. The patient will receive a drug information handout for each medication shipped and additional FDA Medication Guides as required.       DISEASE/MEDICATION-SPECIFIC INFORMATION        N/A    SPECIALTY MEDICATION ADHERENCE     Medication Adherence    Patient reported X missed doses in the last month: 0  Specialty Medication: Azathioprine 50mg   Patient is on additional specialty medications: No        Azathioprine 50 mg: 8 days of medicine on hand     SHIPPING     Shipping address confirmed in Epic.     Delivery Scheduled: Yes, Expected medication delivery date: 07/04/2020.     Medication will be delivered via Same Day Courier to the prescription address in Epic WAM.    [name]   [facility] Specialty Technician

## 2020-07-02 MED ORDER — POSACONAZOLE 100 MG TABLET,DELAYED RELEASE
ORAL_TABLET | Freq: Every day | ORAL | 3 refills | 7 days
Start: 2020-07-02 — End: 2020-07-09

## 2020-07-02 NOTE — Unmapped (Signed)
Pt request for RX Refill    posaconazole (NOXAFIL) 100 mg delayed released tablet

## 2020-07-04 ENCOUNTER — Encounter: Admit: 2020-07-04 | Discharge: 2020-07-05 | Payer: PRIVATE HEALTH INSURANCE

## 2020-07-04 ENCOUNTER — Ambulatory Visit: Admit: 2020-07-04 | Discharge: 2020-07-05 | Payer: MEDICARE

## 2020-07-04 ENCOUNTER — Encounter: Admit: 2020-07-04 | Payer: MEDICARE

## 2020-07-04 ENCOUNTER — Encounter: Admit: 2020-07-04 | Discharge: 2020-07-05 | Payer: MEDICARE

## 2020-07-04 ENCOUNTER — Encounter: Admit: 2020-07-04 | Discharge: 2020-07-05 | Payer: MEDICARE | Attending: Family | Primary: Family

## 2020-07-04 DIAGNOSIS — Z942 Lung transplant status: Principal | ICD-10-CM

## 2020-07-04 DIAGNOSIS — J849 Interstitial pulmonary disease, unspecified: Principal | ICD-10-CM

## 2020-07-04 LAB — HEPATITIS C RNA, QUANTITATIVE, PCR: HCV RNA: NOT DETECTED

## 2020-07-04 LAB — CBC W/ AUTO DIFF
BASOPHILS ABSOLUTE COUNT: 0 10*9/L (ref 0.0–0.1)
BASOPHILS RELATIVE PERCENT: 0.5 %
EOSINOPHILS ABSOLUTE COUNT: 0.1 10*9/L (ref 0.0–0.4)
EOSINOPHILS RELATIVE PERCENT: 2.5 %
HEMATOCRIT: 42 % (ref 41.0–53.0)
HEMOGLOBIN: 13.4 g/dL — ABNORMAL LOW (ref 13.5–17.5)
LARGE UNSTAINED CELLS: 1 % (ref 0–4)
LYMPHOCYTES ABSOLUTE COUNT: 0.6 10*9/L — ABNORMAL LOW (ref 1.5–5.0)
LYMPHOCYTES RELATIVE PERCENT: 17 %
MEAN CORPUSCULAR HEMOGLOBIN CONC: 31.8 g/dL (ref 31.0–37.0)
MEAN CORPUSCULAR HEMOGLOBIN: 31 pg (ref 26.0–34.0)
MEAN CORPUSCULAR VOLUME: 97.6 fL (ref 80.0–100.0)
MEAN PLATELET VOLUME: 8 fL (ref 7.0–10.0)
MONOCYTES ABSOLUTE COUNT: 0.1 10*9/L — ABNORMAL LOW (ref 0.2–0.8)
MONOCYTES RELATIVE PERCENT: 4.3 %
NEUTROPHILS ABSOLUTE COUNT: 2.4 10*9/L (ref 2.0–7.5)
NEUTROPHILS RELATIVE PERCENT: 74.6 %
PLATELET COUNT: 222 10*9/L (ref 150–440)
RED BLOOD CELL COUNT: 4.31 10*12/L — ABNORMAL LOW (ref 4.50–5.90)
RED CELL DISTRIBUTION WIDTH: 17.1 % — ABNORMAL HIGH (ref 12.0–15.0)
WBC ADJUSTED: 3.3 10*9/L — ABNORMAL LOW (ref 4.5–11.0)

## 2020-07-04 LAB — COMPREHENSIVE METABOLIC PANEL
ALBUMIN: 3.4 g/dL (ref 3.4–5.0)
ALKALINE PHOSPHATASE: 62 U/L (ref 46–116)
ALT (SGPT): 56 U/L — ABNORMAL HIGH (ref 10–49)
ANION GAP: 7 mmol/L (ref 5–14)
AST (SGOT): 45 U/L — ABNORMAL HIGH (ref <=34)
BILIRUBIN TOTAL: 0.3 mg/dL (ref 0.3–1.2)
BLOOD UREA NITROGEN: 38 mg/dL — ABNORMAL HIGH (ref 9–23)
BUN / CREAT RATIO: 26
CALCIUM: 9.2 mg/dL (ref 8.7–10.4)
CHLORIDE: 101 mmol/L (ref 98–107)
CO2: 24 mmol/L (ref 20.0–31.0)
CREATININE: 1.49 mg/dL — ABNORMAL HIGH
EGFR CKD-EPI AA MALE: 54 mL/min/{1.73_m2} — ABNORMAL LOW (ref >=60)
EGFR CKD-EPI NON-AA MALE: 47 mL/min/{1.73_m2} — ABNORMAL LOW (ref >=60)
GLUCOSE RANDOM: 112 mg/dL (ref 70–179)
POTASSIUM: 4.1 mmol/L (ref 3.4–4.5)
PROTEIN TOTAL: 6.4 g/dL (ref 5.7–8.2)
SODIUM: 132 mmol/L — ABNORMAL LOW (ref 135–145)

## 2020-07-04 LAB — IGG: GAMMAGLOBULIN; IGG: 552 mg/dL — ABNORMAL LOW (ref 646–2013)

## 2020-07-04 LAB — PHOSPHORUS: PHOSPHORUS: 3 mg/dL (ref 2.4–5.1)

## 2020-07-04 LAB — MAGNESIUM: MAGNESIUM: 1.8 mg/dL (ref 1.6–2.6)

## 2020-07-04 LAB — SLIDE REVIEW

## 2020-07-04 NOTE — Unmapped (Signed)
Labs collected and sent.

## 2020-07-04 NOTE — Unmapped (Signed)
In with Dr. Dudley Major to see Spencer Mallet Sr..  Doing well, PFTs are up.  Will get 4th covid vaccine today.  Repeat labs next week as tac trough will not be accurate.  Return to clinic in two weeks.  Questions answered, verbalized understanding.

## 2020-07-04 NOTE — Unmapped (Signed)
[facility] CLINIC PHARMACY NOTE  07/04/2020   Spencer ALCALA Sr.  [id number]    Medication changes today:   1. INCREASE Novolog to 9/9/4 + SSI, 2 units for every 50>150  2. Continue to hold Lasix    Education/Adherence tools provided today:  1. provided updated medication list  2. provided additional pill box education    Follow up items:  1. Renally adjust Tamiflu and Valcyte dosing if renal function worsens    Next visit with pharmacy in 1-2 weeks  ____________________________________________________________________      Spencer Mallet Sr. is a 70 y.o. 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     Other PMH significant for hypertension, T2DM, colon cancer s/p hemicolectomy and R hepatectomy for metastasis + chemotherapy (2007), BPH    Seen by pharmacy today for: pill box assessment and adherence education and medication management    CC:  Patient complains of multiple episodes of diarrhea, N/V early Thursday morning that have since resolved. .       BP 113/88  07/04/2020   Pulse 103  07/04/2020   Resp 17  06/26/2020   Temp 36.2 ??C (97.2 ??F)  07/04/2020   SpO2 99 %  07/04/2020   Weight 70.8 kg (156 lb)  07/04/2020   Height 1.676 m (5' 5.98)  07/04/2020   BMI (Calculated) 25.19  07/04/2020       Allergies   Allergen Reactions   ??? Cetuximab Anaphylaxis     Chest pain/rapid heart rate/ BP dropped   ??? Nsaids (Non-Steroidal Anti-Inflammatory Drug)      Interaction with transplant medications   ??? Venom-Honey Bee Anaphylaxis   ??? Enalapril      Angioedema     ??? Pollen Extracts      Medications reviewed in EPIC medication station and updated today by the clinical pharmacist practitioner. Medication list includes revisions made during today???s encounter.    Outpatient Encounter Medications as of 07/04/2020   Medication Sig Dispense Refill   ??? acetaminophen (TYLENOL EXTRA STRENGTH) 500 MG tablet Take 2 tablets (1,000 mg total) by mouth Three (3) times a day. 180 tablet 0   ??? aspirin (ECOTRIN) 81 MG tablet Take 1 tablet (81 mg total) by mouth daily. 90 tablet 3   ??? azaTHIOprine (IMURAN) 50 mg tablet Take 2 tablets (100 mg total) by mouth daily. 60 tablet 11   ??? azithromycin (ZITHROMAX) 250 MG tablet Take 1 tablet (250 mg total) by mouth daily. 30 tablet 11   ??? blood sugar diagnostic (ON CALL EXPRESS TEST STRIP) Strp Test daily before all meals/snacks and once before bedtime. 100 each 11   ??? calcium carbonate (CALCIUM 600) 1,500 mg (600 mg elem calcium) tablet Take 1 tablet (600 mg of elem calcium total) by mouth Two (2) times a day. 60 tablet 11   ??? cholecalciferol, vitamin D3 25 mcg, 1,000 units,, 1,000 unit (25 mcg) tablet Take 2 tablets (50 mcg total) by mouth daily. 100 tablet 11   ??? gabapentin (NEURONTIN) 100 MG capsule Take 1 capsule (100 mg total) by mouth Three (3) times a day. 90 capsule 2   ??? insulin ASPART (NOVOLOG FLEXPEN U-100 INSULIN) 100 unit/mL (3 mL) injection pen Inject 8 units with breakfast, 8 units with afternoon meal, and 4 units with evening snack plus sliding scale 2 units for every 50 >150; max 40 units/day 15 mL 2   ??? insulin glargine (BASAGLAR, LANTUS) 100 unit/mL (3 mL) injection pen Inject  0.2 mL (20 Units total) under the skin nightly. 15 mL 3   ??? lancets 30 gauge Misc Test daily before all meals/snacks and once before bedtime. 100 each 11   ??? magnesium oxide (MAG-OX) 400 mg (241.3 mg elemental magnesium) tablet Take 2 tablets (800 mg total) by mouth Two (2) times a day. 120 tablet 11   ??? metFORMIN (GLUCOPHAGE) 1000 MG tablet Take 1 tablet (1,000 mg total) by mouth Two (2) times a day. 60 tablet 11   ??? oseltamivir (TAMIFLU) 75 MG capsule Take 1 capsule (75 mg total) by mouth daily. 20 capsule 2   ??? pantoprazole (PROTONIX) 40 MG tablet Take 1 tablet (40 mg total) by mouth Two (2) times a day. 60 tablet 11   ??? pen needle, diabetic (BD ULTRA-FINE NANO PEN NEEDLE) 32 gauge x 5/32 (4 mm) Ndle Use one new pen needle under the skin Three (3) times a day as directed 200 each 2   ??? posaconazole (NOXAFIL) 100 mg delayed released tablet Take 3 tablets (300 mg total) by mouth daily. 21 tablet 3   ??? pravastatin (PRAVACHOL) 40 MG tablet Take 1 tablet (40 mg total) by mouth daily. 30 tablet 11   ??? predniSONE (DELTASONE) 5 MG tablet Take 4 tablets (20 mg total) by mouth daily. 120 tablet 11   ??? sulfamethoxazole-trimethoprim (BACTRIM) 400-80 mg per tablet Take 1 tablet (80 mg of trimethoprim total) by mouth Every Monday, Wednesday, and Friday. 36 tablet 3   ??? tacrolimus (PROGRAF) 0.5 MG capsule Take 1 capsule (0.5 mg total) by mouth two (2) times a day. 180 capsule 3   ??? terazosin (HYTRIN) 1 MG capsule Take 2 capsules (2 mg total) by mouth nightly. 60 capsule 11   ??? valGANciclovir (VALCYTE) 450 mg tablet Take 2 tablets (900 mg total) by mouth daily. 60 tablet 11     No facility-administered encounter medications on file as of 07/04/2020.       Immunosuppression:  Induction agent: basiliximab    Current immunosuppression:  ??? tacrolimus 0.5 mg PO qAM + 0.5 mg qPM  o Prograf goal: 8-10  ??? Azathioprine 100 mg daily - changed from MMF 2/2 diarrhea  ??? prednisone 20 mg daily    Patient is tolerating immunosuppression well.     IMMUNOSUPPRESSION DRUG LEVELS:  Lab Results   Component Value Date    Tacrolimus, Trough 8.7 06/27/2020    Tacrolimus, Trough 8.4 06/20/2020    Tacrolimus, Trough 12.3 06/13/2020     Tac level not drawn today.    Graft function assessment via PFTs: improving   DSA: last checked 06/13/20 - NTD  Biopsies to date: none  WBC/ANC:  wnl    Plan: Will maintain current immunosuppression.    OI prophylaxis:   CMV Status: D+/ R-, high risk. CMV prophylaxis with valganciclovir 900 mg daily x 12 months per protocol.  Estimated Creatinine Clearance: 41.6 mL/min (A) (based on SCr of 1.49 mg/dL (H)).  PCP: Prophylaxis with bactrim SS 1 tab MWF indefinitely.  Fungal: Currently on posaconazole 300 mg daily for prophylaxis x 3 months per protocol.  ?? Last trough: 1117 (06/13/20)  ?? Goal: > 700 ng/ml   Flu (seasonal): oseltamivir (Tamiflu) 75 mg daily    Patient is tolerating infectious prophylaxis well.     Plan: Continue per protocol. Consider dose adjusting Tamiflu and Valcyte at future visit if renal function worsens.    Infectious History:  Patient does not have a history of pathogenic infectious complications following lung  transplant. 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 Previous antiinfective history use includes meropenem x7 days for broad spectrum gram negative and aspiration PNA coverage post-op per ICID.     Plan: Continue to monitor.    BOS prophylaxis:  Current meds include: azithromycin 250 mg daily and pantoprazole 40 mg BID  Pt denies reflux/heartburn. Esophageal motility study scheduled for 07/23/20. Endoscopy on 06/26/20. Normal findings with biopsy results pending.    Plan: Continue to monitor. Plan to hold PPI for five days prior to motility study.     CV Prophylaxis:   Aspirin: asa 81 mg   The 10-year ASCVD risk score Denman George DC Jr., et al., 2013) is: 22.5%  Statin: pravastatin; currently on 40 mg daily.    Plan: Continue aspirin and statin therapy    BP/Edema: Goal < 140/90. Encounter vitals reported above.  Home BP ranges: 110s-130s/60-80s mmHg; HR 90s-100s  Weights: ranged from 154 to 157.8 lbs  Current meds include: terazosin 2 mg at bedtime for BPH    Plan: BP within goal per home readings. SCr has increased from previous visit, likely secondary to diarrhea and vomiting on Thursday morning. Continue to hold Lasix.     Anemia:  H/H:   Lab Results   Component Value Date    HGB 13.4 (L) 07/04/2020     Lab Results   Component Value Date    HCT 42.0 07/04/2020     Iron panel:  Lab Results   Component Value Date    IRON 21 (L) 05/19/2020    TIBC 226 (L) 05/19/2020    FERRITIN 88.6 05/19/2020     Lab Results   Component Value Date    Iron Saturation (%) 9 05/19/2020     Prior anemia therapy: IV Ferrlecit (1/10-1/13)    Plan: H/H improved since discharge. Continue to monitor. DM:   Lab Results   Component Value Date    A1C 6.0 (H) 06/13/2020   Goal A1c < 7%.  Current meds include:  ?? Insulin Glargine 20 units daily in AM  ?? Novolog SSI 2:50>150  ?? Novolog units 8/8/4  ?? Metformin 1000 mg PO BID  ?? HOLDING home glipizide  Home BS log: Patient eats a small snack as dinner around 8-9 pm and then goes to bed approximately one hour after that. Unable to check bedtime BG as his pre-dinner BG is close to bedtime as it is.    Breakfast  Lunch  Dinner  HS   Date Fasting PC AC PC AC PC    06/27/20 165  195  223     06/28/20 116  264  239     06/29/20 119  241  215     06/30/20 142  190  252     07/01/20 155  289  173     07/02/20 109  241  172     07/03/20 241  199  154     07/04/20 147           Diet: Appetite is improving and he is eating more since last visit. Most meals involve a meat and a potato side. Eats an apple at night.  Exercise: walking 100 ft distance 10-20 times every day  Hypoglycemia: no  Plan: Majority of BGs above goal but have improved prior to previous visit. Max BG at last visit in the low 300s, now 289. Will continue Lantus 20 units nightly, and increase Novolog to 9/9/4 + SSI.     Electrolytes:  wnl  Current meds include: MgOx 800 mg BID    Plan: Continue as above. Can consider decreasing in the future to 400 mg BID if magnesium continues to remain stable.    GI/BM: Diarrhea and GI distress have resolved since Thursday AM episode.   Current meds include: none; previously on ondansetron, Miralax, and senna    Plan: Continue to monitor.     Pain: Pain well controlled with scheduled APAP and gabapentin.    Current meds include: gabapentin 100 mg TID, APAP 1000 mg TID    Plan: continue gabapentin 100 mg TID and APAP 1000 mg TID    Bone health:  Patient currently on long-term steroid therapy.  Vitamin D Level: 27.0 (05/24/20). Goal > 30.   Last DEXA results (02/2020 pre-transplant): normal bone density  Current meds include: vitamin D3 2000 units daily, calcium carbonate 600 mg BID    Plan: Continue to monitor.     Women's/Men's Health:  Spencer Nedeau. is a 70 y.o. male. Patient reports history of BPH. Reports that BPH symptoms are controlled.  Current meds include: terazosin 2 mg PO nightly    Plan: Continue terazosin to 2mg  qHS . Continue to monitor.    Adherence:   Patient has poor understanding of medications.  Patient does not fill their own pill box on a regular basis at home. Wife currently fills.  Patient brought medication card: yes  Pill box: was correct  Patient requested refills for the following meds: None  Corrections needed in Epic medication list: none     Plan: Provided moderate adherence counseling/intervention    I spent a total of 20 minutes face to face with the patient delivering clinical care and providing education/counseling.    Patient was reviewed with Sharee Pimple, ARNP who was agreement with the stated plan.    During this visit, the following was completed:   BG log data assessment  BP log data assessment  Labs ordered and evaluated  complex treatment plan >1 DS   Patient education was completed for 25-60 minutes     All questions/concerns were addressed to the patient's satisfaction.  __________________________________________  PATIENT SEEN AND EVALUATED BY:    Erin E. Manson Passey, PharmD, BCPS  PGY2 Pharmacy Resident, Pharmacotherapy    Jaquavious Mercer TEETER Baran Kuhrt, PHARMD, BCPS, CPP  [employer]ID [employer] TRANSPLANT PHARMACIST PRACTITIONER  PAGER [phone]

## 2020-07-04 NOTE — Unmapped (Signed)
See Pharmacy encounter in clinic this day for additional documentation.

## 2020-07-05 LAB — EBV QUANTITATIVE PCR, BLOOD: EBV VIRAL LOAD RESULT: NOT DETECTED

## 2020-07-05 LAB — CMV DNA, QUANTITATIVE, PCR: CMV VIRAL LD: NOT DETECTED

## 2020-07-07 MED FILL — AZATHIOPRINE 50 MG TABLET: ORAL | 30 days supply | Qty: 60 | Fill #0

## 2020-07-08 MED FILL — OSELTAMIVIR 75 MG CAPSULE: ORAL | 20 days supply | Qty: 20 | Fill #1

## 2020-07-09 LAB — HEPATITIS B DNA, QUANTITATIVE, PCR

## 2020-07-09 LAB — HIV RNA, QUANTITATIVE, PCR: HIV RNA QNT RSLT: NOT DETECTED

## 2020-07-09 NOTE — Unmapped (Signed)
OUTPATIENT PHYSICAL THERAPY PULMONARY DAILY NOTE  Date3/06/2020  Patient Name: Spencer BELCASTRO Sr.  Date of Birth:[birth year]  Visit #:  9  Diagnosis:   Encounter Diagnoses   Name Primary?   ??? Lung transplant recipient (CMS-HCC) Yes   ??? S/P lung transplant (CMS-HCC)    ??? Physical deconditioning    ??? Impaired mobility and endurance    ??? Pre-transplant evaluation for lung transplant    ??? Type 2 diabetes mellitus without complication, without long-term current use of insulin (CMS-HCC)      Date of onset:  05/15/2020  Date of Eval: 06/04/2020  Referring MD: Spencer Pena   POC Dates: 06/04/2020 - 09/02/2020    ASSESSMENT:    70 y.o. male presents status post bilateral lung transplant which is limiting his ability to ambulate community distances and perform work-related tasks independently without experiencing fatigue, loss of balance or shortness of breath.  Patient requires skilled Physical Therapy services for generalized conditioning, gross BLE strengthening and exercises to improve cardiovascular endurance. During the PT evaluation, patient was able to ambulate 1,337 ft during a , however he reported a 6RPE on the modified BORG's scale, indicating moderately hard exertion while ambulating. Patient is unable to maintain static standing balance with vision-denied, and is unable to independently navigate compliant surfaces. He will benefit from neuromuscular reeducation to improve balance. Patient is highly motivated to return to [facility] and will benefit from skilled physical therapy.      Today's session focused on progressing pulmonary rehab with emphasis on cardiovascular endurance and LE strengthening. Patient demonstrated limited endurance today secondary to fatigue. He is able to navigate compliant surfaces with vision-denied without experiencing losses of balance or reports of feeling off balance.     Next session: Reassess    Personal Factors/Comorbidities Present:  limited walking endurance, limited standing balance with vision-denied, shortness of breath with exercise, limited BUE AROM/strength due to sternal precautions, DM II, history of colon and liver cancers    Examination of Body Systems: cardiovascular, neuromuscular, musculoskeletal    Long Term Goals:  In 8 weeks:  1.   Ambulate 2,000 feet in 6 minutes for improved functional activity tolerance.    2.   Increase exercise tolerance to 30 minutes of sitting/standing exercise with modified BORG score of 3.    3.   Demonstrate decreased SOB with activity as demonstrated by a SOBQ score of 10% for improved quality of life.    4.   Patient will be independent with home program and/or transition into community exercise program.      Prognosis for goal achievement:  Good due to good motivation, supportive family, overall health status and motivation.    PLAN:   Treatment Plan and Educational Needs (check all that apply):  x Cough techniques    Acapella/Vest    Postural drainage/Percussion   x Breathing retraining   x Postural exercises    Footwear education   x General conditioning   x Progressive ambulation   x Strengthening   x Stretching   x Home exercise program   x Benefits of exercise    Lung anatomy and physiology/pathology    Infection prevention/treatment    Psychosocial education/support group    Breathing/relaxation techniques    Airway clearance    Healthy eating    Smoking cessation    Other        Pt. will participate in the following as needed:    x Manual Therapy   x Therapeutic Activities  x Therapeutic Exercise   x Neuromuscular Education    Self Care   x Postural Exercises/education    Unattended estim    taping    Scar massage    Therapeutic Dry Needling    Hot/Cold modalities    Cold Laser    Education       Planned frequency of treatment:  3x / week    Planned duration of treatment: 8 weeks      SUBJECTIVE:  Reports having difficulty sleeping secondary to chest discomfort    Reason for Referral/History of Present Condition/Onset of injury/exacerbation:   70 y.o.  male presents to outpatient physical therapy for pulmonary rehab status post lung transplant.   Per Dr. Venancio Pena on 05/28/2020,  Spencer Pena is a 70 y.o. 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 [facility] ECMO time was 192 minutes and patient was successfully weaned off ECMO intra-op. Bilateral apical and basilar chest tubes inserted intra-op and placed to suction. He tolerated the procedure well and was transferred to the CT ICU while intubated for continued post-operative management given critical condition. Patient was successfully extubated on POD 1 and placed on HFNC.   ??Bilateral apical chest tubes removed on POD 4 and post-pull CXR without evidence of pneumothorax. Patient with interval development of Right sided subcutaneous emphysema on POD 4, which improved with bilateral basilar chest tubes to continuous suction. Bilateral basilar CTs placed to WS on POD 8 and removed on POD 9 and POD 10 with unremarkable post-pull CXR.   Epidural catheter removed on POD 2. NGT removed on POD 4 and diet advanced following unremarkable modified barium swallow study on 1/10. Patient successfully passed TOV on POD 7.   He did well postoperatively. Diet was slowly advanced and at the time of discharge, patient was tolerating a regular diet.??The patient was able to void spontaneously, have pain controlled with P.O. pain medication. Patient was evaluated by the Thoracic surgery service and all consultants and was found to be fit for discharge. Patient will be discharged home on Hospital Day 13?? in stable condition. Patient received teaching prior to discharge and appropriate post-op follow-up scheduled.    Home Environment:  Patient lives with wife in single story home with one step to enter.    Oxygen Use:   None at this time    Pulmonary Rehab History/ Home exercise:  Sit to stand for 30 seconds, 2x/day  Continue current walking regimen    Current Pain:  0/10 discomfort in his chest    Patient Goals:  Improve strength, Improve ambulation, Return to recreational activities, Return to work and Improve overhead reaching    OBJECTIVE :    Cough: occasional    Airway Clearance Techniques/Devices: N/A    Range of Motion Deficits: BUE limited by sternal precautions and tightness related to incisions    Strength Deficits: BUE and BLE strength WFL    Posture/skin/other: N/A    Gait Analysis:  decreased gait speed  decreased terminal hip ext on bilaterally    Functional Test/Outcome Measures:  30 second chair stand 18 repetitions    6 MWT distance 1,337 ft, sp02 98, HR 120s, AD used none at this time    SOBQ score 12%    Treatment Rendered:    Total Treatment Time: 45 min  Therapeutic Exercise: 40  min  Vitals: HR 99bpm, SpO2 96%, BP 133/64 mmHg    Nustep, level 5, 15 min, arms at level 11: 1 mile, 23 min.   Vitals: HR 110-120sbpm   SpO2: >96%  Side stepping with GTB around ankles, 4x50'  Sit to stand with GTB proximal to knees, 2x10 without BUE support    HEP:  Tries to walk 2000' inside his house daily.  Incentive spirometer  Sit to stand from chair  Standing heel lifts 2 x20  Standing hip abduction and extension 2 x10-15  Standing mini-squat 2 x10  Feet together, eyes closed, 7 heads with 10-sec hold    I attest that I have reviewed the above information.  Signed: Tona Sensing, PT, DPT  07/09/2020 10:22 AM

## 2020-07-10 NOTE — Unmapped (Signed)
LUNG TRANSPLANT CLASS PARTICIPATION NOTE    Spencer Pena and wife Spencer Pena attended today's lung transplant class via telephone. Lillia Corporal, AGNP, talked about the lung transplant admission.     [name], LCSW, CCTSW  Transplant Case Manager  July 10, 2020 3:46 PM

## 2020-07-11 ENCOUNTER — Encounter: Admit: 2020-07-11 | Discharge: 2020-07-12 | Payer: MEDICARE

## 2020-07-11 DIAGNOSIS — Z942 Lung transplant status: Principal | ICD-10-CM

## 2020-07-11 LAB — CBC W/ AUTO DIFF
BASOPHILS ABSOLUTE COUNT: 0 10*9/L (ref 0.0–0.1)
BASOPHILS RELATIVE PERCENT: 0.4 %
EOSINOPHILS ABSOLUTE COUNT: 0 10*9/L (ref 0.0–0.5)
EOSINOPHILS RELATIVE PERCENT: 1 %
HEMATOCRIT: 38 % — ABNORMAL LOW (ref 39.0–48.0)
HEMOGLOBIN: 12.2 g/dL — ABNORMAL LOW (ref 12.9–16.5)
LYMPHOCYTES ABSOLUTE COUNT: 0.8 10*9/L — ABNORMAL LOW (ref 1.1–3.6)
LYMPHOCYTES RELATIVE PERCENT: 25.2 %
MEAN CORPUSCULAR HEMOGLOBIN CONC: 32.1 g/dL (ref 32.0–36.0)
MEAN CORPUSCULAR HEMOGLOBIN: 30 pg (ref 25.9–32.4)
MEAN CORPUSCULAR VOLUME: 93.6 fL (ref 77.6–95.7)
MEAN PLATELET VOLUME: 6.9 fL (ref 6.8–10.7)
MONOCYTES ABSOLUTE COUNT: 0.2 10*9/L — ABNORMAL LOW (ref 0.3–0.8)
MONOCYTES RELATIVE PERCENT: 5.7 %
NEUTROPHILS ABSOLUTE COUNT: 2.3 10*9/L (ref 1.8–7.8)
NEUTROPHILS RELATIVE PERCENT: 67.7 %
NUCLEATED RED BLOOD CELLS: 0 /100{WBCs} (ref <=4)
PLATELET COUNT: 220 10*9/L (ref 150–450)
RED BLOOD CELL COUNT: 4.06 10*12/L — ABNORMAL LOW (ref 4.26–5.60)
RED CELL DISTRIBUTION WIDTH: 20.2 % — ABNORMAL HIGH (ref 12.2–15.2)
WBC ADJUSTED: 3.3 10*9/L — ABNORMAL LOW (ref 3.6–11.2)

## 2020-07-11 LAB — COMPREHENSIVE METABOLIC PANEL
ALBUMIN: 3.7 g/dL (ref 3.4–5.0)
ALKALINE PHOSPHATASE: 99 U/L (ref 46–116)
ALT (SGPT): 139 U/L — ABNORMAL HIGH (ref 10–49)
ANION GAP: 7 mmol/L (ref 5–14)
AST (SGOT): 37 U/L — ABNORMAL HIGH (ref <=34)
BILIRUBIN TOTAL: 0.5 mg/dL (ref 0.3–1.2)
BLOOD UREA NITROGEN: 42 mg/dL — ABNORMAL HIGH (ref 9–23)
BUN / CREAT RATIO: 29
CALCIUM: 10 mg/dL (ref 8.7–10.4)
CHLORIDE: 104 mmol/L (ref 98–107)
CO2: 28.8 mmol/L (ref 20.0–31.0)
CREATININE: 1.45 mg/dL — ABNORMAL HIGH
EGFR CKD-EPI AA MALE: 56 mL/min/{1.73_m2} — ABNORMAL LOW (ref >=60)
EGFR CKD-EPI NON-AA MALE: 48 mL/min/{1.73_m2} — ABNORMAL LOW (ref >=60)
GLUCOSE RANDOM: 120 mg/dL (ref 70–179)
POTASSIUM: 4.6 mmol/L (ref 3.4–4.8)
PROTEIN TOTAL: 6.8 g/dL (ref 5.7–8.2)
SODIUM: 140 mmol/L (ref 135–145)

## 2020-07-11 LAB — SLIDE REVIEW

## 2020-07-11 LAB — IGG: GAMMAGLOBULIN; IGG: 536 mg/dL — ABNORMAL LOW (ref 646–2013)

## 2020-07-11 LAB — CMV DNA, QUANTITATIVE, PCR: CMV VIRAL LD: NOT DETECTED

## 2020-07-11 LAB — EBV QUANTITATIVE PCR, BLOOD: EBV VIRAL LOAD RESULT: NOT DETECTED

## 2020-07-11 LAB — PHOSPHORUS: PHOSPHORUS: 2.7 mg/dL (ref 2.4–5.1)

## 2020-07-11 LAB — MAGNESIUM: MAGNESIUM: 2 mg/dL (ref 1.6–2.6)

## 2020-07-11 LAB — TACROLIMUS LEVEL, TROUGH: TACROLIMUS, TROUGH: 10.9 ng/mL (ref 5.0–15.0)

## 2020-07-11 NOTE — Unmapped (Signed)
Called [name] to discuss lab results. Tacrolimus level was slightly  increased at 10.9 with a goal of 8-10. Medication regimen is currently 0.5 mg BID. Discussed with [name], CPP, dosing at this time to remain the same. Repeat labs scheduled one week at clinic. Resulted labs reviewed. All questions answered. Pt verbalized understanding.

## 2020-07-11 NOTE — Unmapped (Signed)
Outgoing call to [employer] Sr.. Asked him to page on call coordinator tomorrow to follow up on tac level.  Questions answered, verbalized understanding.

## 2020-07-15 MED ORDER — ACETAMINOPHEN 500 MG TABLET
ORAL_TABLET | Freq: Three times a day (TID) | ORAL | 0 refills | 30 days | Status: CP
Start: 2020-07-15 — End: 2020-08-14

## 2020-07-15 NOTE — Unmapped (Signed)
[facility] Refill Coordination Note    Specialty Medication(s) to be Shipped:   Transplant: valgancyclovir 450mg  and Prednisone 5mg     Other medication(s) to be shipped: tylenol, vitamin D, gabapentin, metformin, test strips, lancets, terazosin, pantoprazole, pravastatin, azithromycin, calcium, mg     Spencer Mallet Sr., DOB: 1951-05-08  Phone: [phone] (home) [phone] (work)      All above HIPAA information was verified with patient's caregiver, [name]     Was a Nurse, learning disability used for this call? No    Completed refill call assessment today to schedule patient's medication shipment from the [facility] [phone]).       Specialty medication(s) and dose(s) confirmed: Regimen is correct and unchanged.   Changes to medications: [name] reports no changes at this time.  Changes to insurance: No  Questions for the pharmacist: No    Confirmed patient received Welcome Packet with first shipment. The patient will receive a drug information handout for each medication shipped and additional FDA Medication Guides as required.       DISEASE/MEDICATION-SPECIFIC INFORMATION        N/A    SPECIALTY MEDICATION ADHERENCE     Medication Adherence    Patient reported X missed doses in the last month: 0  Specialty Medication: Valganciclovir 450mg   Patient is on additional specialty medications: Yes  Additional Specialty Medications: Prednisone 5mg   Patient Reported Additional Medication X Missed Doses in the Last Month: 0  Patient is on more than two specialty medications: No        Valganciclovir 450 mg: 9 days of medicine on hand   Prednisone 5 mg: 9 days of medicine on hand     SHIPPING     Shipping address confirmed in Epic.     Delivery Scheduled: Yes, Expected medication delivery date: 07/18/2020.     Medication will be delivered via Same Day Courier to the prescription address in Epic WAM.    [name]   [facility] Specialty Technician

## 2020-07-16 ENCOUNTER — Ambulatory Visit
Admit: 2020-07-16 | Discharge: 2020-08-14 | Payer: MEDICARE | Attending: Rehabilitative and Restorative Service Providers" | Primary: Rehabilitative and Restorative Service Providers"

## 2020-07-16 ENCOUNTER — Non-Acute Institutional Stay
Admit: 2020-07-16 | Discharge: 2020-08-14 | Payer: MEDICARE | Attending: Rehabilitative and Restorative Service Providers" | Primary: Rehabilitative and Restorative Service Providers"

## 2020-07-16 ENCOUNTER — Encounter
Admit: 2020-07-16 | Discharge: 2020-08-14 | Payer: MEDICARE | Attending: Rehabilitative and Restorative Service Providers" | Primary: Rehabilitative and Restorative Service Providers"

## 2020-07-16 ENCOUNTER — Ambulatory Visit: Admit: 2020-07-16 | Discharge: 2020-08-14 | Payer: MEDICARE

## 2020-07-16 NOTE — Unmapped (Signed)
OUTPATIENT PHYSICAL THERAPY PULMONARY   REASSESSMENT  Date3/01/2021  Patient Name: Spencer Pena.  Date of Birth:Sep 24, 1950  Visit #:  10  Diagnosis:   Encounter Diagnoses   Name Primary?   ??? Lung transplant recipient (CMS-HCC) Yes   ??? S/P lung transplant (CMS-HCC)    ??? Physical deconditioning    ??? Type 2 diabetes mellitus without complication, without long-term current use of insulin (CMS-HCC)    ??? Pre-transplant evaluation for lung transplant    ??? Impaired mobility and endurance      Date of onset:  05/15/2020  Date of Eval: 06/04/2020  Referring MD: Reggie Pile Dermot   POC Dates: 06/04/2020 - 09/02/2020    ASSESSMENT:    70 y.o. male presents status post bilateral lung transplant which is limiting his ability to ambulate community distances and perform work-related tasks independently without experiencing fatigue, loss of balance or shortness of breath.  Patient requires skilled Physical Therapy services for generalized conditioning, gross BLE strengthening and exercises to improve cardiovascular endurance. During the PT evaluation, patient was able to ambulate 1,337 ft during a , however he reported a 6RPE on the modified BORG's scale, indicating moderately hard exertion while ambulating. Patient is unable to maintain static standing balance with vision-denied, and is unable to independently navigate compliant surfaces. He will benefit from neuromuscular reeducation to improve balance. Patient is highly motivated to return to [facility] and will benefit from skilled physical therapy.      The purpose of today's session was to reassess patient's progress. He demonstrates an SOBQ score of 11% and his is 1,355ft. He was able to complete all exercises without RPE increasing past 3 on the modified BORG's scale. He may benefit from further skilled physical therapy to progress his BLE strengthening program, with plans to discharge in 3-4 weeks. Will reduce frequency of PT visits to 1x/week.     Next session: Progress HEP with emphasis on BLE strengthening exercises. Discharge planning in 3-4 sessions.    Examination of Body Systems: cardiovascular, neuromuscular, musculoskeletal    Long Term Goals:  In 8 weeks:  1.   Ambulate 2,000 feet in 6 minutes for improved functional activity tolerance. ONGOING 07/16/20    2.   Increase exercise tolerance to 30 minutes of sitting/standing exercise with modified BORG score of 3. MET 07/16/20    3.   Demonstrate decreased SOB with activity as demonstrated by a SOBQ score of 10% for improved quality of life. ONGOING 07/16/20    4.   Patient will be independent with home program and/or transition into community exercise program. MET 07/16/20      Prognosis for goal achievement:  Good due to good motivation, supportive family, overall health status and motivation.    PLAN:   Treatment Plan and Educational Needs (check all that apply):  x Cough techniques    Acapella/Vest    Postural drainage/Percussion   x Breathing retraining   x Postural exercises    Footwear education   x General conditioning   x Progressive ambulation   x Strengthening   x Stretching   x Home exercise program   x Benefits of exercise    Lung anatomy and physiology/pathology    Infection prevention/treatment    Psychosocial education/support group    Breathing/relaxation techniques    Airway clearance    Healthy eating    Smoking cessation    Other        Pt. will participate in the following as needed:  x Manual Therapy   x Therapeutic Activities   x Therapeutic Exercise   x Neuromuscular Education    Self Care   x Postural Exercises/education    Unattended estim    taping    Scar massage    Therapeutic Dry Needling    Hot/Cold modalities    Cold Laser    Education       Planned frequency of treatment:  3x / week    Planned duration of treatment: 8 weeks      SUBJECTIVE:  Patient reports feeling 85% improvement since initial evaluation. I feel like I could do a lot more. I get out and I walk 1,200-1,400 ft everyday. My wife is the one holding me back and making sure I don't do what I'm not supposed to.    Reason for Referral/History of Present Condition/Onset of injury/exacerbation:   70 y.o.  male presents to outpatient physical therapy for pulmonary rehab status post lung transplant.   Per Dr. Venancio Poisson on 05/28/2020,  Bernard Slayden is a 70 y.o. 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 VA ECMO time was 192 minutes and patient was successfully weaned off ECMO intra-op. Bilateral apical and basilar chest tubes inserted intra-op and placed to suction. He tolerated the procedure well and was transferred to the CT ICU while intubated for continued post-operative management given critical condition. Patient was successfully extubated on POD 1 and placed on HFNC.   ??Bilateral apical chest tubes removed on POD 4 and post-pull CXR without evidence of pneumothorax. Patient with interval development of Right sided subcutaneous emphysema on POD 4, which improved with bilateral basilar chest tubes to continuous suction. Bilateral basilar CTs placed to WS on POD 8 and removed on POD 9 and POD 10 with unremarkable post-pull CXR.   Epidural catheter removed on POD 2. NGT removed on POD 4 and diet advanced following unremarkable modified barium swallow study on 1/10. Patient successfully passed TOV on POD 7.   He did well postoperatively. Diet was slowly advanced and at the time of discharge, patient was tolerating a regular diet.??The patient was able to void spontaneously, have pain controlled with P.O. pain medication. Patient was evaluated by the Thoracic surgery service and all consultants and was found to be fit for discharge. Patient will be discharged home on Hospital Day 13?? in stable condition. Patient received teaching prior to discharge and appropriate post-op follow-up scheduled.    Home Environment:  Patient lives with wife in single story home with one step to enter.    Oxygen Use:   None at this time    Pulmonary Rehab History/ Home exercise:  Sit to stand for 30 seconds, 2x/day  Continue current walking regimen    Current Pain:  0/10     Patient Goals:  Improve strength, Improve ambulation, Return to recreational activities, Return to work and Improve overhead reaching    OBJECTIVE :    Cough: occasional    Airway Clearance Techniques/Devices: N/A    Range of Motion Deficits: BUE limited by sternal precautions and tightness related to incisions    Strength Deficits: BUE and BLE strength WFL    Posture/skin/other: N/A    Functional Test/Outcome Measures:  30 second chair stand 18 repetitions with BUE (from evaluation)  30 second chair stand 14 repetition without BUE (  reassessment)    6 MWT distance 1,371 ft, sp02 98, HR 120s, AD used none at this time, BORG rate of perceived exertion 2-3    SOBQ score 12%    Treatment Rendered:    Total Treatment Time: 45 min  PPT: 25 min  Vitals: HR 101bpm, BP: 140/30mmHg, O2: 98%  30 seconds sit to stand: 14 (without BUE)  : 1,360ft  SOBQ    There Ex: 15 min  Sit to stand with theraband prox to knees: 2x15  Monster walks (sideways, forward/retro), TB prox to ankles, 4x20' each.  Heel raises  Single leg RDLs (modified to chair), 1O10 B    HEP:  Tries to walk 2000' inside his house daily.  Incentive spirometer  Sit to stand from chair  Standing heel lifts 2 x20  Standing hip abduction and extension 2 x10-15  Standing mini-squat 2 x10  Feet together, eyes closed, 7 heads with 10-sec hold    I attest that I have reviewed the above information.  Signed: Tona Sensing, PT, DPT  07/16/2020 9:57 AM

## 2020-07-17 DIAGNOSIS — E559 Vitamin D deficiency, unspecified: Principal | ICD-10-CM

## 2020-07-18 ENCOUNTER — Encounter: Admit: 2020-07-18 | Discharge: 2020-07-19 | Payer: MEDICARE

## 2020-07-18 DIAGNOSIS — B259 Cytomegaloviral disease, unspecified: Principal | ICD-10-CM

## 2020-07-18 DIAGNOSIS — Z942 Lung transplant status: Principal | ICD-10-CM

## 2020-07-18 DIAGNOSIS — J849 Interstitial pulmonary disease, unspecified: Principal | ICD-10-CM

## 2020-07-18 LAB — TACROLIMUS LEVEL, TROUGH: TACROLIMUS, TROUGH: 11.8 ng/mL (ref 5.0–15.0)

## 2020-07-18 LAB — CBC W/ AUTO DIFF
BASOPHILS ABSOLUTE COUNT: 0 10*9/L (ref 0.0–0.1)
BASOPHILS RELATIVE PERCENT: 0.1 %
EOSINOPHILS ABSOLUTE COUNT: 0 10*9/L (ref 0.0–0.5)
EOSINOPHILS RELATIVE PERCENT: 0.9 %
HEMATOCRIT: 37.6 % — ABNORMAL LOW (ref 39.0–48.0)
HEMOGLOBIN: 12.7 g/dL — ABNORMAL LOW (ref 12.9–16.5)
LYMPHOCYTES ABSOLUTE COUNT: 0.7 10*9/L — ABNORMAL LOW (ref 1.1–3.6)
LYMPHOCYTES RELATIVE PERCENT: 21.3 %
MEAN CORPUSCULAR HEMOGLOBIN CONC: 33.8 g/dL (ref 32.0–36.0)
MEAN CORPUSCULAR HEMOGLOBIN: 31.1 pg (ref 25.9–32.4)
MEAN CORPUSCULAR VOLUME: 91.9 fL (ref 77.6–95.7)
MEAN PLATELET VOLUME: 6.7 fL — ABNORMAL LOW (ref 6.8–10.7)
MONOCYTES ABSOLUTE COUNT: 0.1 10*9/L — ABNORMAL LOW (ref 0.3–0.8)
MONOCYTES RELATIVE PERCENT: 4.7 %
NEUTROPHILS ABSOLUTE COUNT: 2.3 10*9/L (ref 1.8–7.8)
NEUTROPHILS RELATIVE PERCENT: 73 %
PLATELET COUNT: 182 10*9/L (ref 150–450)
RED BLOOD CELL COUNT: 4.09 10*12/L — ABNORMAL LOW (ref 4.26–5.60)
RED CELL DISTRIBUTION WIDTH: 19.9 % — ABNORMAL HIGH (ref 12.2–15.2)
WBC ADJUSTED: 3.2 10*9/L — ABNORMAL LOW (ref 3.6–11.2)

## 2020-07-18 LAB — COMPREHENSIVE METABOLIC PANEL
ALBUMIN: 3.8 g/dL (ref 3.4–5.0)
ALKALINE PHOSPHATASE: 88 U/L (ref 46–116)
ALT (SGPT): 61 U/L — ABNORMAL HIGH (ref 10–49)
ANION GAP: 5 mmol/L (ref 5–14)
AST (SGOT): 29 U/L (ref <=34)
BILIRUBIN TOTAL: 0.4 mg/dL (ref 0.3–1.2)
BLOOD UREA NITROGEN: 37 mg/dL — ABNORMAL HIGH (ref 9–23)
BUN / CREAT RATIO: 23
CALCIUM: 9.9 mg/dL (ref 8.7–10.4)
CHLORIDE: 101 mmol/L (ref 98–107)
CO2: 30 mmol/L (ref 20.0–31.0)
CREATININE: 1.58 mg/dL — ABNORMAL HIGH
EGFR CKD-EPI AA MALE: 50 mL/min/{1.73_m2} — ABNORMAL LOW (ref >=60)
EGFR CKD-EPI NON-AA MALE: 44 mL/min/{1.73_m2} — ABNORMAL LOW (ref >=60)
GLUCOSE RANDOM: 108 mg/dL — ABNORMAL HIGH (ref 70–99)
POTASSIUM: 4.4 mmol/L (ref 3.4–4.8)
PROTEIN TOTAL: 6.7 g/dL (ref 5.7–8.2)
SODIUM: 136 mmol/L (ref 135–145)

## 2020-07-18 LAB — IGG: GAMMAGLOBULIN; IGG: 533 mg/dL — ABNORMAL LOW (ref 646–2013)

## 2020-07-18 LAB — POSACONAZOLE: POSACONAZOLE LEVEL: 1748 ng/mL

## 2020-07-18 LAB — PHOSPHORUS: PHOSPHORUS: 2.7 mg/dL (ref 2.4–5.1)

## 2020-07-18 LAB — MAGNESIUM: MAGNESIUM: 1.9 mg/dL (ref 1.6–2.6)

## 2020-07-18 MED ORDER — METFORMIN 1,000 MG TABLET
ORAL_TABLET | 0 refills | 0 days | Status: CP
Start: 2020-07-18 — End: ?

## 2020-07-18 MED ORDER — VALGANCICLOVIR 450 MG TABLET
ORAL_TABLET | Freq: Every day | ORAL | 11 refills | 30 days | Status: CP
Start: 2020-07-18 — End: ?
  Filled 2020-07-21: qty 30, 30d supply, fill #0

## 2020-07-18 MED ORDER — ACETAMINOPHEN 500 MG TABLET
ORAL_TABLET | Freq: Four times a day (QID) | ORAL | 0 refills | 23 days | Status: CP | PRN
Start: 2020-07-18 — End: 2020-08-17
  Filled 2020-07-21: qty 180, 23d supply, fill #0

## 2020-07-18 MED ORDER — OSELTAMIVIR 30 MG CAPSULE
ORAL_CAPSULE | Freq: Every day | ORAL | 2 refills | 30.00000 days | Status: CP
Start: 2020-07-18 — End: ?

## 2020-07-18 MED FILL — PRAVASTATIN 40 MG TABLET: ORAL | 30 days supply | Qty: 30 | Fill #1

## 2020-07-18 MED FILL — CHOLECALCIFEROL (VITAMIN D3) 25 MCG (1,000 UNIT) TABLET: ORAL | 50 days supply | Qty: 100 | Fill #0

## 2020-07-18 MED FILL — ON CALL EXPRESS TEST STRIP: 20 days supply | Qty: 100 | Fill #0

## 2020-07-18 MED FILL — PANTOPRAZOLE 40 MG TABLET,DELAYED RELEASE: ORAL | 30 days supply | Qty: 60 | Fill #1

## 2020-07-18 MED FILL — AZITHROMYCIN 250 MG TABLET: ORAL | 30 days supply | Qty: 30 | Fill #1

## 2020-07-18 MED FILL — TERAZOSIN 1 MG CAPSULE: ORAL | 30 days supply | Qty: 60 | Fill #0

## 2020-07-18 MED FILL — PREDNISONE 5 MG TABLET: ORAL | 30 days supply | Qty: 120 | Fill #1

## 2020-07-18 MED FILL — CALCIUM CARBONATE 600 MG CALCIUM (1,500 MG) TABLET: ORAL | 30 days supply | Qty: 60 | Fill #1

## 2020-07-18 MED FILL — MAGNESIUM OXIDE 400 MG (241.3 MG MAGNESIUM) TABLET: ORAL | 30 days supply | Qty: 120 | Fill #1

## 2020-07-18 MED FILL — ON CALL LANCET 30 GAUGE: 20 days supply | Qty: 100 | Fill #0

## 2020-07-18 NOTE — Unmapped (Signed)
Per provider, the patient received Moderna 4th dose vaccine.  Patient ID verified with name and date of birth.  All screening questions were answered.  Vaccine(s) were administered as ordered.  See immunization history for documentation.  Patient tolerated the injection(s) well and waited the required observation period with no issues noted.  Vaccine Information sheet given to the patient.

## 2020-07-18 NOTE — Unmapped (Signed)
Clinical Assessment Needed For: Dose Change  Medication: Valganciclovir 450mg  tablet  Last Fill Date/Day Supply: 06/23/2020 / 30 days  Copay $5  Was previous dose already scheduled to fill: Yes    Notes to Pharmacist: Scheduled to fill today SAME DAY 03/11. Other dose was D/C'd after being verified. I've contacted [facility] to discuss the situation and see what can/needs to be done.

## 2020-07-18 NOTE — Unmapped (Signed)
See Pharmacy encounter in clinic this day for additional documentation.

## 2020-07-18 NOTE — Unmapped (Signed)
Called Spencer Mallet Sr. to discuss lab results. Tacrolimus level was increased at 11.8 with a goal new goal of 7-9. Medication regimen is currently 0.5 mg BID. Discussed with [name], CPP, dosing at this time decreased to  0.5 mg daily, posa level pending. Repeat labs scheduled one week. Resulted labs reviewed. All questions answered. Pt verbalized understanding.

## 2020-07-18 NOTE — Unmapped (Signed)
In with Dr. Glenard Haring to see Spencer Mallet Sr. Doing well, PFTs stable.  Cr increased, will adjust Valcyte.  Will get covid booster today and evusheld in two weeks with return clinic visit.  Will ask Lennox Laity to reach out to discuss diet.  Questions answered, verbalized understanding.

## 2020-07-18 NOTE — Unmapped (Signed)
[facility] CLINIC PHARMACY NOTE  07/17/2020   Spencer Pena Sr.  [id number]    Medication changes today:   1. Stop gabapentin  2. Decrease valcyte to 450mg  daily  3. Decrease tamiflu to 30mg  daily  4. Change apap to PRN    Education/Adherence tools provided today:  1. provided updated medication list  2. provided additional pill box education    Follow up items:  1. Scr  2. Will reach out to dietician to discuss consistency with carbohydrate intake    Next visit with pharmacy in 1-2 weeks  ____________________________________________________________________      Spencer Pena Sr. is a 70 y.o. 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     Other PMH significant for hypertension, T2DM, colon cancer s/p hemicolectomy and R hepatectomy for metastasis + chemotherapy (2007), BPH    Seen by pharmacy today for: pill box assessment and adherence education and medication management    CC:  Patient has no complaints today.     There were no vitals filed for this visit.      Allergies   Allergen Reactions   ??? Cetuximab Anaphylaxis     Chest pain/rapid heart rate/ BP dropped   ??? Nsaids (Non-Steroidal Anti-Inflammatory Drug)      Interaction with transplant medications   ??? Venom-Honey Bee Anaphylaxis   ??? Enalapril      Angioedema     ??? Pollen Extracts      Medications reviewed in EPIC medication station and updated today by the clinical pharmacist practitioner. Medication list includes revisions made during today???s encounter.    Outpatient Encounter Medications as of 07/18/2020   Medication Sig Dispense Refill   ??? acetaminophen (TYLENOL EXTRA STRENGTH) 500 MG tablet Take 2 tablets (1,000 mg total) by mouth Three (3) times a day. 180 tablet 0   ??? aspirin (ECOTRIN) 81 MG tablet Take 1 tablet (81 mg total) by mouth daily. 90 tablet 3   ??? azaTHIOprine (IMURAN) 50 mg tablet Take 2 tablets (100 mg total) by mouth daily. 60 tablet 11   ??? azithromycin (ZITHROMAX) 250 MG tablet Take 1 tablet (250 mg total) by mouth daily. 30 tablet 11   ??? blood sugar diagnostic (ON CALL EXPRESS TEST STRIP) Strp Test daily before all meals/snacks and once before bedtime. 100 each 11   ??? calcium carbonate (CALCIUM 600) 1,500 mg (600 mg elem calcium) tablet Take 1 tablet (600 mg of elem calcium total) by mouth Two (2) times a day. 60 tablet 11   ??? cholecalciferol, vitamin D3 25 mcg, 1,000 units,, 1,000 unit (25 mcg) tablet Take 2 tablets (50 mcg total) by mouth daily. 100 tablet 11   ??? gabapentin (NEURONTIN) 100 MG capsule Take 1 capsule (100 mg total) by mouth Three (3) times a day. 90 capsule 2   ??? insulin ASPART (NOVOLOG FLEXPEN U-100 INSULIN) 100 unit/mL (3 mL) injection pen Inject 8 units with breakfast, 8 units with afternoon meal, and 4 units with evening snack plus sliding scale 2 units for every 50 >150; max 40 units/day 15 mL 2   ??? insulin glargine (BASAGLAR, LANTUS) 100 unit/mL (3 mL) injection pen Inject 0.2 mL (20 Units total) under the skin nightly. 15 mL 3   ??? lancets 30 gauge Misc Test daily before all meals/snacks and once before bedtime. 100 each 11   ??? magnesium oxide (MAG-OX) 400 mg (241.3 mg elemental magnesium) tablet Take 2 tablets (800 mg total) by mouth Two (2)  times a day. 120 tablet 11   ??? metFORMIN (GLUCOPHAGE) 1000 MG tablet Take 1 tablet (1,000 mg total) by mouth Two (2) times a day. 60 tablet 11   ??? oseltamivir (TAMIFLU) 75 MG capsule Take 1 capsule (75 mg total) by mouth daily. 20 capsule 2   ??? pantoprazole (PROTONIX) 40 MG tablet Take 1 tablet (40 mg total) by mouth Two (2) times a day. 60 tablet 11   ??? pen needle, diabetic (BD ULTRA-FINE NANO PEN NEEDLE) 32 gauge x 5/32 (4 mm) Ndle Use one new pen needle under the skin Three (3) times a day as directed 200 each 2   ??? posaconazole (NOXAFIL) 100 mg delayed released tablet Take 3 tablets (300 mg total) by mouth daily. 21 tablet 3   ??? pravastatin (PRAVACHOL) 40 MG tablet Take 1 tablet (40 mg total) by mouth daily. 30 tablet 11   ??? predniSONE (DELTASONE) 5 MG tablet Take 4 tablets (20 mg total) by mouth daily. 120 tablet 11   ??? sulfamethoxazole-trimethoprim (BACTRIM) 400-80 mg per tablet Take 1 tablet (80 mg of trimethoprim total) by mouth Every Monday, Wednesday, and Friday. 36 tablet 3   ??? tacrolimus (PROGRAF) 0.5 MG capsule Take 1 capsule (0.5 mg total) by mouth two (2) times a day. 180 capsule 3   ??? terazosin (HYTRIN) 1 MG capsule Take 2 capsules (2 mg total) by mouth nightly. 60 capsule 11   ??? valGANciclovir (VALCYTE) 450 mg tablet Take 2 tablets (900 mg total) by mouth daily. 60 tablet 11     No facility-administered encounter medications on file as of 07/18/2020.       Immunosuppression:  Induction agent: basiliximab    Current immunosuppression:  ??? tacrolimus 0.5 mg PO qAM + 0.5 mg qPM  o Prograf goal: 8-10  ??? Azathioprine 100 mg daily - changed from MMF 2/2 diarrhea  ??? prednisone 20 mg daily    Patient is tolerating immunosuppression well.     IMMUNOSUPPRESSION DRUG LEVELS:  Lab Results   Component Value Date    Tacrolimus, Trough 10.9 07/11/2020    Tacrolimus, Trough 8.7 06/27/2020    Tacrolimus, Trough 8.4 06/20/2020     Tacrolimus level is accurate 12 hour trough    Graft function assessment via PFTs: improving   DSA: last checked 06/13/20 - NTD  Biopsies to date: none  WBC/ANC:  wnl    Plan: Will maintain current immunosuppression pending tac level    OI prophylaxis:   CMV Status: D+/ R-, high risk. CMV prophylaxis with valganciclovir 900 mg daily x 12 months per protocol.  Estimated Creatinine Clearance: 42.8 mL/min (A) (based on SCr of 1.45 mg/dL (H)).  PCP: Prophylaxis with bactrim SS 1 tab MWF indefinitely.  Fungal: Currently on posaconazole 300 mg daily for prophylaxis x 3 months per protocol.  ?? Last trough: 992 (06/27/20)  ?? Goal: > 700 ng/ml   Flu (seasonal): oseltamivir (Tamiflu) 75 mg daily    Patient is tolerating infectious prophylaxis well.     Plan: Continue per protocol. Dose adjusting Tamiflu and Valcyte for renal dysfunction.    Infectious History:  Patient does not have a history of pathogenic infectious complications following lung transplant. 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 Previous antiinfective history use includes meropenem x7 days for broad spectrum gram negative and aspiration PNA coverage post-op per ICID.     Plan: Continue to monitor.    BOS prophylaxis:  Current meds include: azithromycin 250 mg daily and pantoprazole 40 mg BID  Pt denies reflux/heartburn. Esophageal motility study scheduled for 07/23/20. Endoscopy on 06/26/20. Normal findings with biopsy results pending.    Plan: Continue to monitor. Plan to hold PPI for five days prior to motility study (currently doing).     CV Prophylaxis:   Aspirin: asa 81 mg daily  The 10-year ASCVD risk score Denman George DC Jr., et al., 2013) is: 22.5%  Statin: pravastatin; currently on 40 mg daily.    Plan: Continue aspirin, hold statin given rising LFTs    BP/Edema: Goal < 140/90. Encounter vitals reported above.  Home BP ranges: 110s-120s/70s  Weights: down due to lack of appetite  Current meds include: terazosin 2 mg at bedtime for BPH    Plan: BP within goal per home readings.     Anemia:  H/H:   Lab Results   Component Value Date    HGB 12.2 (L) 07/11/2020     Lab Results   Component Value Date    HCT 38.0 (L) 07/11/2020     Iron panel:  Lab Results   Component Value Date    IRON 21 (L) 05/19/2020    TIBC 226 (L) 05/19/2020    FERRITIN 88.6 05/19/2020     Lab Results   Component Value Date    Iron Saturation (%) 9 05/19/2020     Prior anemia therapy: IV Ferrlecit (1/10-1/13)    Plan: H/H improved since discharge. Continue to monitor.     DM:   Lab Results   Component Value Date    A1C 6.0 (H) 06/13/2020   Goal A1c < 7%.  Current meds include:  ?? Insulin Glargine 20 units daily in AM  ?? Novolog SSI 2:50>150  ?? Novolog units 9/9/4  ?? Metformin 1000 mg PO BID  ?? HOLDING home glipizide    Home BS log: highly variable, fastings 98-145; lunch 115-218, dinners 111-276    Patient eats a small snack as dinner around 8-9 pm and then goes to bed approximately one hour after that. Unable to check bedtime BG as his pre-dinner BG is close to bedtime as it is. Highly variable PO intake  Diet: Appetite is improving and he is eating more since last visit. Most meals involve a meat and a potato side. Eats an apple at night.  Exercise: walking 100 ft distance 10-20 times every day  Hypoglycemia: no  Plan: highly variable BG; continue current insulin regimen, will reach out to dietician to discuss consistency with carbohydrate intake    Electrolytes: wnl  Current meds include: MgOx 800 mg BID    Plan: Continue as above. Can consider decreasing in the future to 400 mg BID if magnesium continues to remain stable.    GI/BM: Diarrhea and GI distress have resolved since Thursday AM episode.   Current meds include: none; previously on ondansetron, Miralax, and senna    Plan: Continue to monitor.     Pain: no pain   Current meds include: gabapentin 100 mg TID, APAP 1000 mg TID    Plan: stop gabapentin, change apap to PRN    Bone health:  Patient currently on long-term steroid therapy.  Vitamin D Level: 27.0 (05/24/20). Goal > 30.   Last DEXA results (02/2020 pre-transplant): normal bone density  Current meds include: vitamin D3 2000 units daily, calcium carbonate 600 mg BID    Plan: repeat vitamin d level today     Women's/Men's Health:  Spencer Pena Sr. is a 70 y.o. male. Patient reports history of BPH. Reports that BPH  symptoms are controlled.  Current meds include: terazosin 2 mg PO nightly    Plan: Continue terazosin to 2mg  qHS . Continue to monitor.    Adherence:   Patient has poor understanding of medications.  Patient does not fill their own pill box on a regular basis at home. Wife currently fills.  Patient brought medication card: yes  Pill box: was correct  Patient requested refills for the following meds: None  Corrections needed in Epic medication list: none     Plan: Provided moderate adherence counseling/intervention    I spent a total of 20 minutes face to face with the patient delivering clinical care and providing education/counseling.    Patient was reviewed with Dr.Coakley who was agreement with the stated plan.    During this visit, the following was completed:   BG log data assessment  BP log data assessment  Labs ordered and evaluated  complex treatment plan >1 DS   Patient education was completed for 25-60 minutes     All questions/concerns were addressed to the patient's satisfaction.  __________________________________________  PATIENT SEEN AND EVALUATED BY:  Cienna Dumais TEETER Atleigh Gruen, PHARMD, BCPS, CPP  [employer] PHARMACIST PRACTITIONER  PAGER [phone]

## 2020-07-18 NOTE — Unmapped (Signed)
Pulmonary Transplant Clinic    HISTORY:   HPI:  70 y.o. 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     --> basiliximab induction    --> CMV high risk (D+/R-)    --> EBV moderate risk (D+/R+)    --> Not Increased Risk Donor  - Colon Cancer 2007    --> s/p resection of liver mets    --> s/p resection of 6 inches of colon    --> FOLFOX Chemo  - type 2 diabetes mellitus     --> not insulin dependent  - OA    SOCIAL HISTORY:   - 20pk years, quit in 1991  - drinks about 10 drinks a week but will stop  - Works maintenance at [employer]  - Married and lives in [place]    FAMILY HISTORY:   - Parents lived into late 90's    MEDS:  (personally reviewed in EPIC, pertinent meds noted below)     Current Outpatient Medications   Medication Instructions   ??? acetaminophen (TYLENOL EXTRA STRENGTH) 500-1,000 mg, Oral, Every 6 hours PRN   ??? aspirin (ECOTRIN) 81 mg, Oral, Daily (standard)   ??? azaTHIOprine (IMURAN) 100 mg, Oral, Daily (standard)   ??? azithromycin (ZITHROMAX) 250 mg, Oral, Daily (standard)   ??? blood sugar diagnostic (ON CALL EXPRESS TEST STRIP) Strp Test daily before all meals/snacks and once before bedtime.   ??? calcium carbonate (CALCIUM 600) 1,500 mg (600 mg elem calcium) tablet 600 mg of elem calcium, Oral, 2 times a day (standard)   ??? cholecalciferol (vitamin D3 25 mcg (1,000 units)) 50 mcg, Oral, Daily (standard)   ??? insulin ASPART (NOVOLOG FLEXPEN U-100 INSULIN) 100 unit/mL (3 mL) injection pen Inject 8 units with breakfast, 8 units with afternoon meal, and 4 units with evening snack plus sliding scale 2 units for every 50 >150; max 40 units/day   ??? insulin glargine (BASAGLAR, LANTUS) 20 Units, Subcutaneous, Nightly   ??? lancets 30 gauge Misc Test daily before all meals/snacks and once before bedtime.   ??? magnesium oxide (MAG-OX) 800 mg, Oral, 2 times a day (standard)   ??? metFORMIN (GLUCOPHAGE) 1,000 mg, Oral, 2 times a day (standard)   ??? oseltamivir (TAMIFLU) 30 mg, Oral, Daily (standard)   ??? pantoprazole (PROTONIX) 40 mg, Oral, 2 times a day (standard)   ??? pen needle, diabetic (BD ULTRA-FINE NANO PEN NEEDLE) 32 gauge x 5/32 (4 mm) Ndle Use one new pen needle under the skin Three (3) times a day as directed   ??? posaconazole (NOXAFIL) 300 mg, Oral, Daily (standard)   ??? pravastatin (PRAVACHOL) 40 mg, Oral, Daily (standard)   ??? predniSONE (DELTASONE) 20 mg, Oral, Daily (standard)   ??? sulfamethoxazole-trimethoprim (BACTRIM) 400-80 mg per tablet 80 mg of trimethoprim, Oral, Every Mon-Wed-Fri   ??? tacrolimus (PROGRAF) 0.5 mg,  Oral, 2 times a day   ??? terazosin (HYTRIN) 2 mg, Oral, Nightly   ??? valGANciclovir (VALCYTE) 450 mg, Oral, Daily (standard)         OBJECTIVE DATA:   PHYSICAL EXAM:  BP 117/68  - Pulse 104  - Temp 36.4 ??C (Tympanic)  - Ht 167.6 cm (5' 5.98)  - Wt 69.5 kg (153 lb 3.2 oz)  - SpO2 98%  - BMI 24.74 kg/m??      Wt Readings from Last 12 Encounters:   07/18/20 69.5 kg (153 lb 3.2 oz)   07/18/20 69.5 kg (153 lb 3.2 oz)   07/04/20 70.8 kg (156 lb)   07/04/20 70.8 kg (156 lb)   06/27/20 72.1 kg (159 lb)   06/27/20 72.1 kg (159 lb)   06/26/20 70.2 kg (154 lb 12.8 oz)   06/20/20 70.8 kg (156 lb)   06/13/20 71.1 kg (156 lb 12.8 oz)   06/13/20 71.1 kg (156 lb 12.8 oz)   06/10/20 74.1 kg (163 lb 6.4 oz)   06/03/20 75.4 kg (166 lb 4.8 oz)     Gen: - awake, alert, in NAD  Derm: - well-healing surgical scars s/p BOLT, right sided chest tube sites healing very well, left sided chest tube sites healing well but remain scarred over  Vascular: - good pulses throughout, no JVP   CV: - RRR, no m/r/g  Pulm: - CTA bilaterally, resonant to percussion throughout  Abd: - soft, NT, ND, +BS, no hepatosplenomegally  Ext: - no edema, no clubbing  Neuro: - CN grossly intact, nl gait     LABS: (reviewed in Epic, pertinent values noted below)     Lab Results   Component Value Date    WBC 3.2 (L) 07/18/2020    HGB 12.7 (L) 07/18/2020    HCT 37.6 (L) 07/18/2020    PLT 182 07/18/2020       Lab Results   Component Value Date    NA 136 07/18/2020    K 4.4 07/18/2020    CL 101 07/18/2020    CO2 30.0 07/18/2020    BUN 37 (H) 07/18/2020    CREATININE 1.58 (H) 07/18/2020    GLU 108 (H) 07/18/2020    CALCIUM 9.9 07/18/2020    MG 1.9 07/18/2020    PHOS 2.7 07/18/2020       Lab Results   Component Value Date    BILITOT 0.4 07/18/2020    BILIDIR 0.10 05/28/2020    PROT 6.7 07/18/2020    ALBUMIN 3.8 07/18/2020    ALT 61 (H) 07/18/2020    AST 29 07/18/2020    ALKPHOS 88 07/18/2020    GGT 19 05/15/2020       Lab Results   Component Value Date    PT 13.4 05/17/2020    INR 1.15 05/17/2020    APTT 23.9 (L) 05/15/2020         PFTs:  (personally reveiewed and interpreted):  Date: FVC (% Pred) FEV1 (% Pred) FEF25-75(% Pred) DLCO TBBx/Results   07/18/20 3.49 (94%) 2.98 (105%) 4.20 (189%)     07/04/20 3.43 (92.5%) 2.97 (104.7%) 4.26 (191.7%)     06/27/2020 3.13 84.5 2.70 95.4 3.51 157.8     06/20/20 3.39 (91.4%) 2.82 (99.2%) 3.34 (150.35)     06/13/20 3.10 (72.8%) 2.66 (82.5%) 3.34 (136.2%)      05/15/20      BOLT    03/11/20  2.75 (72%)  2.36 (81%)       02/12/20  2.85 (  75%)  2.44 (86%)                IMAGING: (Personally reviewed, our interpretation is below)  CXR 07/18/2020:  Small right pleural effusion vs mild elevation of the right hemidiaphragm, stable since early 06/2020    ASSESSMENT and PLAN   70 y.o. 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   (was due for biopsy on 06/15/20, but held off for age and good PFTs at that time)  - s/p IVIg on 01/08 and 01/18, total IgG 533 07/18/2020  - HLA status: No DSAs, 06/13/20 HLA testing with DPB1:1 but report comments on DP1 bead known false reactivity) - previous provider discussed with HLA lab that this is definitely false positive.  Repeat pending 07/18/2020.  - s/p basiliximab induction  - Tacro dose: 0.5mg  BID, Goal: 8-10 (lower for age)  - Imuran 100 mg daily; Stopped MMF 02/04 (GI upset)  - Prednisone Dose: 20mg  daily  - Azithro and prava for BOS ppx, aspirin 81 mg daily    Antimicrobial Prophylaxis:  - CMV, high risk (D+/R-): valganciclovir 900mg  daily for Estimated Creatinine Clearance: 39.3 mL/min (A) (based on SCr of 1.58 mg/dL (H)). Negative 06/20/20  - EBV, (D+/R+): negative 06/20/20  - Fungal: Posaconazole 300mg  daily, last level 992 (06/27/20)  - PCP: Bactrim SS MWF   - tamiflu during flu season    Pain control:  - Moderately well controlled  - Continue gabapentin 100mg  TID  - Continue scheduled tylenol    Loose stools (resolved)  - Improved with switch from MMF to imuran, now resolved  - No associated urgency or stomach cramping/pain.  - Likely medication related.  - GI pathogen panel negative, Cdiff negative.    Possible OSA:   - Noted to have desaturations to 88% overnight. Reportedly snores.  - Will need outpatient overnight oximetry to evaluate    GERD   - studies ordered, normal esophagus but erosive gastropathy on EGD, biopsies pending  - pH probe upcoming in March  - Continue protonix??    BPH  - Increased terazosin to 2mg  nightly to help symptoms and BP, and symptoms have resolved    Blood Pressure Management:  - Home BPs stable  - Not currently on therapy    Kidney Function:  - Cr baseline 0.8-1.13  - Cr 1.58 on 07/18/2020, slowly rising since early 06/2020  - Lasix on hold, no increase in LE edema    Iron Deficiency Anemia:   - received IV iron 05/22/20, ongoing fatigue, ctm  ??  Antiarrythmic Prophylaxis:   - finished amiodarone without arrhythmia.     Type 2 diabetes mellitus   - metformin + insulin, continue lantus 20, increase mealtime to 9/9/4   - Estimated Creatinine Clearance: 39.3 mL/min (A) (based on SCr of 1.58 mg/dL (H)).  - Blood sugars remain elevated    Bone Health: Last Dexa: Oct 2021, no osteopenia or osteoporosis  - on Ca and Vit D supplementation currently, last Vit D 27 on 05/24/20    Health Maintenance:  - Last derm visit:  Pre-txp  - Last Colonsocopy (if >40): 12/2016 Diverticulosis and internal hemorrhoids  - Last flu shot:  02/08/2020  - Last Pneumovax:  06/10/2004, 12/30/2017  - Last Prevnar:  03/25/2016  - TdaP:   02/11/2018  - Shingrix:    03/10/20, Due #2 (planned for administration)  - COVID-19 (Moderna) 06/11/19, 07/09/19, 03/10/20, got 1/2 dose Evushield 06/20/20, booster 07/18/2020  - Last Dexa:   02/2020  - Last vitamin D:  27 (05/24/2020)  - Last HbA1c:   6.5 (05/15/2020)    Immunization History   Administered Date(s) Administered   ??? COVID-19 VACCINE,MRNA(MODERNA)(PF)(IM) 06/11/2019, 07/09/2019, 03/10/2020, 07/18/2020   ??? HEPB-CPG,ADULT DOSAGE (2 DOSE SCHEDULE)ADJUVANTED, IM 03/12/2020   ??? INFLUENZA INJ MDCK PF, QUAD,(FLUCELVAX)(24MO AND UP EGG FREE) 02/08/2020   ??? INFLUENZA TIV (TRI) PF (IM) 02/27/2010   ??? Influenza Vaccine Quad (IIV4 PF) 103mo+ injectable 01/19/2019   ??? Influenza Virus Vaccine, unspecified formulation 02/07/2013, 02/28/2015, 02/05/2016, 02/02/2017, 01/27/2018   ??? PNEUMOCOCCAL POLYSACCHARIDE 23 06/10/2004, 12/30/2017   ??? Pneumococcal Conjugate 13-Valent 03/25/2016   ??? SHINGRIX-ZOSTER VACCINE (HZV), RECOMBINANT,SUB-UNIT,ADJUVANTED IM 03/10/2020   ??? TdaP 02/15/2012, 02/11/2018 The patient was assessed and discussed with Dr. Glenard Haring who is in agreement with plan.    Agapito Games, MD  [facility]

## 2020-07-19 LAB — CMV DNA, QUANTITATIVE, PCR: CMV VIRAL LD: NOT DETECTED

## 2020-07-19 LAB — EBV QUANTITATIVE PCR, BLOOD: EBV VIRAL LOAD RESULT: NOT DETECTED

## 2020-07-20 LAB — HEPATITIS B SURFACE ANTIBODY
HEPATITIS B SURFACE ANTIBODY QUANT: 86.26 m[IU]/mL — ABNORMAL HIGH (ref <8.00)
HEPATITIS B SURFACE ANTIBODY: REACTIVE — AB

## 2020-07-21 LAB — VITAMIN D 25 HYDROXY: VITAMIN D, TOTAL (25OH): 51.2 ng/mL (ref 20.0–80.0)

## 2020-07-22 NOTE — Unmapped (Signed)
OUTPATIENT PHYSICAL THERAPY PULMONARY   DAILY TREATMENT NOTE  Date3/15/2022  Patient Name: Spencer KUTZER Sr.  Date of Birth:09/09/1950  Visit #:  11  Diagnosis:   No diagnosis found.  Date of onset:  05/15/2020  Date of Eval: 06/04/2020  Referring MD: Reggie Pile Dermot   POC Dates: 06/04/2020 - 09/02/2020    ASSESSMENT:    70 y.o. male presents status post bilateral lung transplant which is limiting his ability to ambulate community distances and perform work-related tasks independently without experiencing fatigue, loss of balance or shortness of breath.  Patient requires skilled Physical Therapy services for generalized conditioning, gross BLE strengthening and exercises to improve cardiovascular endurance. During the PT evaluation, patient was able to ambulate 1,337 ft during a , however he reported a 6RPE on the modified BORG's scale, indicating moderately hard exertion while ambulating. Patient is unable to maintain static standing balance with vision-denied, and is unable to independently navigate compliant surfaces. He will benefit from neuromuscular reeducation to improve balance. Patient is highly motivated to return to [facility] and will benefit from skilled physical therapy.      Today's session focused on LE strengthening. Patient tolerated treatment well. He benefited from regular rest breaks. Was able to work up to 3 x 5 at 135lb on leg press.     Next session: Progress HEP with emphasis on BLE strengthening exercises. Discharge planning in 3-4 sessions.    Examination of Body Systems: cardiovascular, neuromuscular, musculoskeletal    Long Term Goals:  In 8 weeks:  1.   Ambulate 2,000 feet in 6 minutes for improved functional activity tolerance. ONGOING 07/16/20    2.   Increase exercise tolerance to 30 minutes of sitting/standing exercise with modified BORG score of 3. MET 07/16/20    3.   Demonstrate decreased SOB with activity as demonstrated by a SOBQ score of 10% for improved quality of life. ONGOING 07/16/20    4.   Patient will be independent with home program and/or transition into community exercise program. MET 07/16/20      Prognosis for goal achievement:  Good due to good motivation, supportive family, overall health status and motivation.    PLAN:   Treatment Plan and Educational Needs (check all that apply):  x Cough techniques    Acapella/Vest    Postural drainage/Percussion   x Breathing retraining   x Postural exercises    Footwear education   x General conditioning   x Progressive ambulation   x Strengthening   x Stretching   x Home exercise program   x Benefits of exercise    Lung anatomy and physiology/pathology    Infection prevention/treatment    Psychosocial education/support group    Breathing/relaxation techniques    Airway clearance    Healthy eating    Smoking cessation    Other        Pt. will participate in the following as needed:    x Manual Therapy   x Therapeutic Activities   x Therapeutic Exercise   x Neuromuscular Education    Self Care   x Postural Exercises/education    Unattended estim    taping    Scar massage    Therapeutic Dry Needling    Hot/Cold modalities    Cold Laser    Education       Planned frequency of treatment:  3x / week    Planned duration of treatment: 8 weeks      SUBJECTIVE:  Patient reports no  updates.     Reason for Referral/History of Present Condition/Onset of injury/exacerbation:   70 y.o.  male presents to outpatient physical therapy for pulmonary rehab status post lung transplant.   Per Dr. Venancio Poisson on 05/28/2020,  Deniro Laymon is a 70 y.o. 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 VA ECMO time was 192 minutes and patient was successfully weaned off ECMO intra-op. Bilateral apical and basilar chest tubes inserted intra-op and placed to suction. He tolerated the procedure well and was transferred to the CT ICU while intubated for continued post-operative management given critical condition. Patient was successfully extubated on POD 1 and placed on HFNC.   ??Bilateral apical chest tubes removed on POD 4 and post-pull CXR without evidence of pneumothorax. Patient with interval development of Right sided subcutaneous emphysema on POD 4, which improved with bilateral basilar chest tubes to continuous suction. Bilateral basilar CTs placed to WS on POD 8 and removed on POD 9 and POD 10 with unremarkable post-pull CXR.   Epidural catheter removed on POD 2. NGT removed on POD 4 and diet advanced following unremarkable modified barium swallow study on 1/10. Patient successfully passed TOV on POD 7.   He did well postoperatively. Diet was slowly advanced and at the time of discharge, patient was tolerating a regular diet.??The patient was able to void spontaneously, have pain controlled with P.O. pain medication. Patient was evaluated by the Thoracic surgery service and all consultants and was found to be fit for discharge. Patient will be discharged home on Hospital Day 13?? in stable condition. Patient received teaching prior to discharge and appropriate post-op follow-up scheduled.    Home Environment:  Patient lives with wife in single story home with one step to enter.    Oxygen Use:   None at this time    Pulmonary Rehab History/ Home exercise:  Sit to stand for 30 seconds, 2x/day  Continue current walking regimen    Current Pain:  0/10     Patient Goals:  Improve strength, Improve ambulation, Return to recreational activities, Return to work and Improve overhead reaching    OBJECTIVE :  Vitals: HR 104bpm, BP: 137/68mmHg, O2: 98%      There Ex: 40 min  Leg press:  45#, 60#, 75#, 90#, 105# x 10  120# x 5  135# 3 x 5    Step throughs at 7 step x 10 each side    Sit to stand with feet on rocker board 2 x 10 from raised mat table, progressed to low mat table     Lateral step ups 7 step x 10 each side no UE support    Heel raises 2 x 10    Standing hip abduction and extension x 10 each direction each LE      Functional Test/Outcome Measures:  30 second chair stand 18 repetitions with BUE (from evaluation)  30 second chair stand 14 repetition without BUE (reassessment)    6 MWT distance 1,371 ft, sp02 98, HR 120s, AD used none at this time, BORG rate of perceived exertion 2-3    SOBQ score 12%    Treatment Rendered:    Total Treatment Time: 40 min    HEP:  Tries to walk 2000' inside his house daily.  Incentive spirometer  Sit to stand from chair  Standing heel lifts 2 x20  Standing hip abduction and extension 2 x10-15  Standing mini-squat 2 x10  Feet together, eyes closed, 7 heads with 10-sec hold    I attest that I have reviewed the above information.  Signed: Christoper Allegra, PT, DPT  07/22/2020 11:00 AM

## 2020-07-23 ENCOUNTER — Encounter: Admit: 2020-07-23 | Discharge: 2020-07-23 | Payer: MEDICARE

## 2020-07-23 MED ADMIN — lidocaine (XYLOCAINE) 2% viscous mucosal solution: NASAL | @ 13:00:00 | Stop: 2020-07-23

## 2020-07-23 MED ADMIN — benzocaine 20% (HURRICAINE) mucosal gel: NASAL | @ 13:00:00 | Stop: 2020-07-23

## 2020-07-23 NOTE — Unmapped (Signed)
GI Motility Lab Discharge Instructions:     *You may resume your normal activities, diet, and medications after your procedure unless your doctor instructed you otherwise.      *You may notice a little blood on the tissue due to irritation from the procedure.    *If you have any unusual pain or bleeding we recommend you call the GI physician  on call at [phone] or report to the nearest emergency room.    *Contact your doctor for your test results, medications questions, or other issues.  Allow two weeks for your doctor to receive the test results.     *You may have a slightly sore throat if you had an esophageal test.

## 2020-07-24 ENCOUNTER — Encounter: Admit: 2020-07-24 | Discharge: 2020-07-25 | Payer: MEDICARE

## 2020-07-24 LAB — CBC W/ AUTO DIFF
BASOPHILS ABSOLUTE COUNT: 0 10*9/L (ref 0.0–0.1)
BASOPHILS RELATIVE PERCENT: 0.3 %
EOSINOPHILS ABSOLUTE COUNT: 0 10*9/L (ref 0.0–0.5)
EOSINOPHILS RELATIVE PERCENT: 0.8 %
HEMATOCRIT: 37.5 % — ABNORMAL LOW (ref 39.0–48.0)
HEMOGLOBIN: 12.5 g/dL — ABNORMAL LOW (ref 12.9–16.5)
LYMPHOCYTES ABSOLUTE COUNT: 0.7 10*9/L — ABNORMAL LOW (ref 1.1–3.6)
LYMPHOCYTES RELATIVE PERCENT: 19.6 %
MEAN CORPUSCULAR HEMOGLOBIN CONC: 33.5 g/dL (ref 32.0–36.0)
MEAN CORPUSCULAR HEMOGLOBIN: 31 pg (ref 25.9–32.4)
MEAN CORPUSCULAR VOLUME: 92.5 fL (ref 77.6–95.7)
MEAN PLATELET VOLUME: 6.9 fL (ref 6.8–10.7)
MONOCYTES ABSOLUTE COUNT: 0.2 10*9/L — ABNORMAL LOW (ref 0.3–0.8)
MONOCYTES RELATIVE PERCENT: 4.7 %
NEUTROPHILS ABSOLUTE COUNT: 2.8 10*9/L (ref 1.8–7.8)
NEUTROPHILS RELATIVE PERCENT: 74.6 %
PLATELET COUNT: 180 10*9/L (ref 150–450)
RED BLOOD CELL COUNT: 4.05 10*12/L — ABNORMAL LOW (ref 4.26–5.60)
RED CELL DISTRIBUTION WIDTH: 20.1 % — ABNORMAL HIGH (ref 12.2–15.2)
WBC ADJUSTED: 3.8 10*9/L (ref 3.6–11.2)

## 2020-07-24 LAB — COMPREHENSIVE METABOLIC PANEL
ALBUMIN: 3.8 g/dL (ref 3.4–5.0)
ALKALINE PHOSPHATASE: 85 U/L (ref 46–116)
ALT (SGPT): 75 U/L — ABNORMAL HIGH (ref 10–49)
ANION GAP: 9 mmol/L (ref 5–14)
AST (SGOT): 34 U/L (ref <=34)
BILIRUBIN TOTAL: 0.4 mg/dL (ref 0.3–1.2)
BLOOD UREA NITROGEN: 35 mg/dL — ABNORMAL HIGH (ref 9–23)
BUN / CREAT RATIO: 23
CALCIUM: 9.6 mg/dL (ref 8.7–10.4)
CHLORIDE: 102 mmol/L (ref 98–107)
CO2: 26 mmol/L (ref 20.0–31.0)
CREATININE: 1.51 mg/dL — ABNORMAL HIGH
EGFR CKD-EPI AA MALE: 53 mL/min/{1.73_m2} — ABNORMAL LOW (ref >=60)
EGFR CKD-EPI NON-AA MALE: 46 mL/min/{1.73_m2} — ABNORMAL LOW (ref >=60)
GLUCOSE RANDOM: 86 mg/dL (ref 70–99)
POTASSIUM: 4.1 mmol/L (ref 3.4–4.8)
PROTEIN TOTAL: 6.6 g/dL (ref 5.7–8.2)
SODIUM: 137 mmol/L (ref 135–145)

## 2020-07-24 LAB — HLA DS POST TRANSPLANT
ANTI-DONOR DRW #1 MFI: 0 MFI
ANTI-DONOR HLA-A #1 MFI: 0 MFI
ANTI-DONOR HLA-A #2 MFI: 0 MFI
ANTI-DONOR HLA-B #1 MFI: 0 MFI
ANTI-DONOR HLA-B #2 MFI: 0 MFI
ANTI-DONOR HLA-C #1 MFI: 0 MFI
ANTI-DONOR HLA-C #2 MFI: 0 MFI
ANTI-DONOR HLA-DQB #1 MFI: 0 MFI
ANTI-DONOR HLA-DQB #2 MFI: 0 MFI
ANTI-DONOR HLA-DR #1 MFI: 0 MFI
ANTI-DONOR HLA-DR #2 MFI: 0 MFI

## 2020-07-24 LAB — CMV DNA, QUANTITATIVE, PCR: CMV VIRAL LD: NOT DETECTED

## 2020-07-24 LAB — IGG: GAMMAGLOBULIN; IGG: 504 mg/dL — ABNORMAL LOW (ref 646–2013)

## 2020-07-24 LAB — TACROLIMUS LEVEL, TROUGH: TACROLIMUS, TROUGH: 5.3 ng/mL (ref 5.0–15.0)

## 2020-07-24 LAB — FSAB CLASS 2 ANTIBODY SPECIFICITY: HLA CL2 AB RESULT: NEGATIVE

## 2020-07-24 LAB — MAGNESIUM: MAGNESIUM: 1.9 mg/dL (ref 1.6–2.6)

## 2020-07-24 LAB — PHOSPHORUS: PHOSPHORUS: 3.7 mg/dL (ref 2.4–5.1)

## 2020-07-24 LAB — FSAB CLASS 1 ANTIBODY SPECIFICITY: HLA CLASS 1 ANTIBODY RESULT: NEGATIVE

## 2020-07-24 NOTE — Unmapped (Signed)
LUNG TRANSPLANT CLASS PARTICIPATION NOTE    Cranston and wife [name] attended today's lung transplant class via telephone. Arley Phenix, PT, talked about the lung transplant admission. We also had a discussion about using My  Chart.     [name], LCSW, CCTSW  Transplant Case Manager  July 24, 2020 4:26 PM

## 2020-07-24 NOTE — Unmapped (Signed)
Called Spencer Pena Sr. to discuss lab results. Tacrolimus level was decreased at 5.3 with a goal of 7-9. Medication regimen is currently 0.5 mg daily. Discussed with Dr. Reggie Pile, dosing at this time increased to 0.5 mg BID. Repeat labs scheduled one week. Resulted labs reviewed. All questions answered. Pt verbalized understanding.

## 2020-07-24 NOTE — Unmapped (Signed)
Patient returned for removal of pH probe.  Removed catheter with ease.  No complications.  Patient instructed to follow up with provider for results of test.

## 2020-07-26 LAB — EBV QUANTITATIVE PCR, BLOOD: EBV VIRAL LOAD RESULT: NOT DETECTED

## 2020-07-29 DIAGNOSIS — Z942 Lung transplant status: Principal | ICD-10-CM

## 2020-07-29 MED ORDER — PEN NEEDLE, DIABETIC 32 GAUGE X 5/32" (4 MM)
Freq: Four times a day (QID) | SUBCUTANEOUS | 3 refills | 50 days | Status: CP
Start: 2020-07-29 — End: ?

## 2020-07-29 NOTE — Unmapped (Signed)
Pulmonary Transplant Clinic    HISTORY:   HPI:  70 y.o. 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, also with type 2 diabetes mellitus, hypertension, colon cancer in 2007 s/p partial colon and hepatic wedge resection and FOLFOX here for f/u, approaching 3 mos mark:    - Feeling well overall  - Denies cough, SOB, sputum production, fevers, chills  - Home PFTs are steady  - Appetite varies and has gained a bit of weight.  - Minimal diarrhea with sugar free icecream otherwise ok.  - Had minimal reflux/heartburn when off PPI for testing, otherwise minimal symptoms  - Denies LE edema  - Home BPs stable  - Blood sugars still a struggle, eating a lot of carbs.  - Exercising in rehab but has missed a couple of sessions (weather).      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     --> basiliximab induction    --> CMV high risk (D+/R-)    --> EBV moderate risk (D+/R+)    --> Not Increased Risk Donor  - Colon Cancer 2007    --> s/p resection of liver mets    --> s/p resection of 6 inches of colon    --> FOLFOX Chemo  - type 2 diabetes mellitus     --> not insulin dependent  - OA    SOCIAL HISTORY:   - 20pk years, quit in 1991  - drinks about 10 drinks a week but will stop  - Works maintenance at [employer]  - Married and lives in [place]    FAMILY HISTORY:   - Parents lived into late 90's    MEDS:  (personally reviewed in [place], pertinent meds noted below)     Current Outpatient Medications   Medication Instructions   ??? acetaminophen (TYLENOL EXTRA STRENGTH) 500-1,000 mg, Oral, Every 6 hours PRN   ??? aspirin (ECOTRIN) 81 mg, Oral, Daily (standard)   ??? azaTHIOprine (IMURAN) 100 mg, Oral, Daily (standard)   ??? azithromycin (ZITHROMAX) 250 mg, Oral, Daily (standard)   ??? blood sugar diagnostic (ON CALL EXPRESS TEST STRIP) Strp Test daily before all meals/snacks and once before bedtime.   ??? calcium carbonate (CALCIUM 600) 1,500 mg (600 mg elem calcium) tablet 600 mg of elem calcium, Oral, 2 times a day (standard)   ??? cholecalciferol (vitamin D3 25 mcg (1,000 units)) 50 mcg, Oral, Daily (standard)   ??? insulin ASPART (NOVOLOG FLEXPEN U-100 INSULIN) 100 unit/mL (3 mL) injection pen Inject 8 units with breakfast, 8 units with afternoon meal, and 4 units with evening snack plus sliding scale 2 units for every 50 >150; max 40 units/day   ??? insulin glargine (BASAGLAR, LANTUS) 20 Units, Subcutaneous, Nightly   ??? lancets 30 gauge Misc Test daily before all meals/snacks and once before bedtime.   ??? magnesium oxide (MAG-OX) 800 mg, Oral, 2 times a day (standard)   ??? metFORMIN (GLUCOPHAGE) 1000 MG tablet Take 1 tablet by mouth twice daily   ??? oseltamivir (TAMIFLU) 30 mg, Oral, Daily (standard)   ??? pantoprazole (PROTONIX) 40 mg, Oral, 2 times a day (standard)   ??? pen needle, diabetic (BD ULTRA-FINE NANO PEN NEEDLE) 32 gauge x 5/32 (4 mm) Ndle 1 each, Subcutaneous, 4 times a day   ??? posaconazole (NOXAFIL) 300 mg, Oral, Daily (standard)   ??? pravastatin (PRAVACHOL) 40 mg, Oral, Daily (standard)   ??? predniSONE (DELTASONE) 20 mg, Oral, Daily (standard)   ??? sulfamethoxazole-trimethoprim (BACTRIM)  400-80 mg per tablet 80 mg of trimethoprim, Oral, Every Mon-Wed-Fri   ??? tacrolimus (PROGRAF) 0.5 mg, Oral, 2 times a day   ??? terazosin (HYTRIN) 2 mg, Oral, Nightly   ??? valGANciclovir (VALCYTE) 450 mg, Oral, Daily (standard)         OBJECTIVE DATA:   PHYSICAL EXAM:  BP 135/72 (BP Site: R Arm, BP Position: Sitting, BP Cuff Size: Medium)  - Pulse 99  - Temp 36.1 ??C (97 ??F) (Tympanic)  - Ht 171.5 cm (5' 7.5)  - Wt 70.8 kg (156 lb 1.6 oz)  - SpO2 100%  - BMI 24.09 kg/m??      Wt Readings from Last 12 Encounters:   07/23/20 68.4 kg (150 lb 12.8 oz)   07/18/20 69.5 kg (153 lb 3.2 oz)   07/18/20 69.5 kg (153 lb 3.2 oz)   07/04/20 70.8 kg (156 lb)   07/04/20 70.8 kg (156 lb)   06/27/20 72.1 kg (159 lb)   06/27/20 72.1 kg (159 lb)   06/26/20 70.2 kg (154 lb 12.8 oz)   06/20/20 70.8 kg (156 lb)   06/13/20 71.1 kg (156 lb 12.8 oz) 06/13/20 71.1 kg (156 lb 12.8 oz)   06/10/20 74.1 kg (163 lb 6.4 oz)     Gen: - awake, alert, in NAD  Derm: - well-healing surgical scars s/p BOLT, right sided chest tube sites healing very well, left sided chest tube sites healing well but remain scarred over  Vascular: - good pulses throughout, no JVP   CV: - RRR, no m/r/g  Pulm: - CTA bilaterally, resonant to percussion throughout  Abd: - soft, NT, ND, +BS, no hepatosplenomegally  Ext: - no edema, no clubbing  Neuro: - CN grossly intact, nl gait     LABS: (reviewed in Epic, pertinent values noted below)     Lab Results   Component Value Date    WBC 3.7 08/01/2020    HGB 12.3 (L) 08/01/2020    HCT 36.0 (L) 08/01/2020    PLT 160 08/01/2020       Lab Results   Component Value Date    NA 138 08/01/2020    K 4.4 08/01/2020    CL 101 08/01/2020    CO2 25.0 08/01/2020    BUN 44 (H) 08/01/2020    CREATININE 1.37 (H) 08/01/2020    GLU 82 08/01/2020    CALCIUM 9.7 08/01/2020    MG 1.5 (L) 08/01/2020    PHOS 2.7 08/01/2020       Lab Results   Component Value Date    BILITOT 0.3 08/01/2020    BILIDIR 0.10 05/28/2020    PROT 6.6 08/01/2020    ALBUMIN 3.9 08/01/2020    ALT 97 (H) 08/01/2020    AST 56 (H) 08/01/2020    ALKPHOS 95 08/01/2020    GGT 19 05/15/2020       Lab Results   Component Value Date    PT 13.4 05/17/2020    INR 1.15 05/17/2020    APTT 23.9 (L) 05/15/2020         PFTs:  (personally reveiewed and interpreted):  Date: FVC (% Pred) FEV1 (% Pred) FEF25-75(% Pred) DLCO TBBx/Results                   08/01/20 3.64 (98.2%) 3.10 (109.3%) 4.41 (198.8%)     07/18/20 3.49 (94%) 2.98 (105%) 4.20 (189%)     07/04/20 3.43 (92.5%) 2.97 (104.7%) 4.26 (191.7%)     06/27/2020 3.13 84.5 2.70  95.4 3.51 157.8     06/20/20 3.39 (91.4%) 2.82 (99.2%) 3.34 (150.35)     06/13/20 3.10 (72.8%) 2.66 (82.5%) 3.34 (136.2%)      05/15/20      BOLT    03/11/20  2.75 (72%)  2.36 (81%)       02/12/20  2.85 (75%)  2.44 (86%)                IMAGING: (Personally reviewed, our interpretation is below)  CXR 07/18/2020:  Small right pleural effusion vs mild elevation of the right hemidiaphragm, stable since early 06/2020    ASSESSMENT and PLAN   70 y.o. 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, also with type 2 diabetes mellitus, hypertension, colon cancer in 2007 s/p partial colon and hepatic wedge resection and FOLFOX here for follow up    Graft Function and Immunosuppression:  - PFTs continue to increase, CXR today w/ persistent R hemidiaphragm elevation (previously queried possible effusion and none seen on ultrasound).   (was due for biopsy on 06/15/20, but held off for age and good PFTs at that time)  - Will hold off on 3 mos bronch in setting of age, excellent PFTs.  Will send cell free DNA.  - s/p IVIg on 01/08 and 01/18, total IgG 468 (08/01/20), needs infusion.  - HLA status: No DSAs, 06/13/20 HLA testing with DPB1:1 but report comments on DP1 bead known false reactivity) - previous provider discussed with HLA lab that this is definitely false positive.  Repeat negative 07/18/2020 with MFIs all 0.  - s/p basiliximab induction  - Tacro dose: 0.5mg  BID, Goal: 8-10 (lower for age)  - Imuran 100 mg daily; Stopped MMF 02/04 (GI upset).  Will drop imuran to 75mg  daily with no DSAs, excellent PFTs, age and renal function.  - Prednisone Dose: 20mg  daily  - Azithro and prava for BOS ppx, aspirin 81 mg daily    Antimicrobial Prophylaxis:  - CMV, high risk (D+/R-): valganciclovir 900mg  daily for Estimated Creatinine Clearance: 47.8 mL/min (A) (based on SCr of 1.37 mg/dL (H)). Negative 06/20/20  - EBV, (D+/R+): negative 06/20/20  - Fungal: Posaconazole 300mg  daily, last level 992 (06/27/20).  Will plan to stop at 3mos pending normal cell free DNA test.    - PCP: Bactrim SS MWF   - tamiflu during flu season    Pain control:  - Moderately well controlled  - Continue gabapentin 100mg  TID  - Continue scheduled tylenol    Loose stools (resolved)  - Improved with switch from MMF to imuran, now resolved  - Encouraged him to avoid sugar free foods as this exacerbates symptoms.  - No associated urgency or stomach cramping/pain.  - Likely medication related.  - GI pathogen panel negative, Cdiff negative.    Possible OSA:   - Noted to have desaturations to 88% overnight. Reportedly snores.  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  - pH probe with DeMeester 16.7%, slightly in abnormal range but will hold off on surgical intervention at this time in the setting of normal graft function.  - Continue protonix but drop from BID to QD    BPH  - Increased terazosin to 2mg  nightly to help symptoms and BP, and  symptoms have resolved    Blood Pressure Management:  - Home BPs stable  - Not currently on therapy    Kidney Function:  - Cr baseline 0.8-1.13  - Cr 1.37 08/01/20, slowly rising since early 06/2020  - Lasix on hold, no increase in LE edema    Iron Deficiency Anemia:   - received IV iron 05/22/20, ongoing fatigue, ctm  ??  Antiarrythmic Prophylaxis:   - finished amiodarone without arrhythmia.     Type 2 diabetes mellitus   - metformin + insulin, continue lantus 20, mealtime to 9/9/4   - Estimated Creatinine Clearance: 47.8 mL/min (A) (based on SCr of 1.37 mg/dL (H)).  - Blood sugars remain elevated    Bone Health: Last Dexa: Oct 2021, no osteopenia or osteoporosis  - on Ca and Vit D supplementation currently, last Vit D 27 on 05/24/20    Health Maintenance:  - Last derm visit:  Pre-txp  - Last Colonsocopy (if >40): 12/2016 Diverticulosis and internal hemorrhoids  - Last flu shot:   02/08/2020  - Last Pneumovax:  06/10/2004, 12/30/2017  - Last Prevnar:  03/25/2016  - TdaP:   02/11/2018  - Shingrix:    03/10/20, Due #2 (planned for administration)  - COVID-19 (Moderna) 06/11/19, 07/09/19, 03/10/20, 07/18/2020, received 1/2 dose Evushield 06/20/20, needs booster today.  - Last Dexa:   02/2020  - Last vitamin D:  27 (05/24/2020)  - Last HbA1c:   6.5 (05/15/2020)    Immunization History   Administered Date(s) Administered   ??? COVID-19 VACCINE,MRNA(MODERNA)(PF)(IM) 06/11/2019, 07/09/2019, 03/10/2020, 07/18/2020   ??? HEPB-CPG,ADULT DOSAGE (2 DOSE SCHEDULE)ADJUVANTED, IM 03/12/2020   ??? INFLUENZA INJ MDCK PF, QUAD,(FLUCELVAX)(69MO AND UP EGG FREE) 02/08/2020   ??? INFLUENZA TIV (TRI) PF (IM) 02/27/2010   ??? Influenza Vaccine Quad (IIV4 PF) 53mo+ injectable 01/19/2019   ??? Influenza Virus Vaccine, unspecified formulation 02/07/2013, 02/28/2015, 02/05/2016, 02/02/2017, 01/27/2018   ??? PNEUMOCOCCAL POLYSACCHARIDE 23 06/10/2004, 12/30/2017   ??? Pneumococcal Conjugate 13-Valent 03/25/2016   ??? SHINGRIX-ZOSTER VACCINE (HZV), RECOMBINANT,SUB-UNIT,ADJUVANTED IM 03/10/2020   ??? TdaP 02/15/2012, 02/11/2018       >76min was spent with the patient face to face and >75min was spent reviewing chart/imaging.     Complex medical decision making was done as we adjust medications based on blood work/drug levels and multiple complaints and problems were addressed    The patient was assessed and discussed with Dr. Dudley Major who is in agreement with plan.    Sharee Pimple MSN, FNP-C  August 01, 2020 9:16 AM  Lung Transplant Nurse Practitioner  Pager [phone]

## 2020-07-29 NOTE — Unmapped (Signed)
[facility] Refill Coordination Note    Specialty Medication(s) to be Shipped:   Transplant: Azathioprine 50mg     Other medication(s) to be shipped: pen needles     Spencer Pena Sr., DOB: 1950/08/20  Phone: [phone] (home) [phone] (work)      All above HIPAA information was verified with patient's caregiver, Spencer Pena     Was a Nurse, learning disability used for this call? No    Completed refill call assessment today to schedule patient's medication shipment from the [facility] [phone]).       Specialty medication(s) and dose(s) confirmed: Regimen is correct and unchanged.   Changes to medications: Latwan reports no changes at this time.  Changes to insurance: No  Questions for the pharmacist: No    Confirmed patient received a Conservation officer, historic buildings and a Surveyor, mining with first shipment. The patient will receive a drug information handout for each medication shipped and additional FDA Medication Guides as required.       DISEASE/MEDICATION-SPECIFIC INFORMATION        N/A    SPECIALTY MEDICATION ADHERENCE     Medication Adherence    Patient reported X missed doses in the last month: 0  Specialty Medication: Azathioprine 50mg   Patient is on additional specialty medications: No        Azathioprine 50 mg: 11 days of medicine on hand     SHIPPING     Shipping address confirmed in Epic.     Delivery Scheduled: Yes, Expected medication delivery date: 08/05/2020.     Medication will be delivered via Next Day Courier to the prescription address in Epic WAM.    [name]   [facility] Specialty Technician

## 2020-07-30 NOTE — Unmapped (Signed)
[facility]  [address] [place], [place] [address]    [phone]    NAHZIR POHLE Sr. did not show for his scheduled Physical Therapy follow-up session, secondary to inclement weather.  Please contact me if you have any questions or concerns.    Thank you for this referral,     Signed: Tona Sensing, PT, DPT  07/30/2020 11:57 AM

## 2020-07-31 DIAGNOSIS — Z942 Lung transplant status: Principal | ICD-10-CM

## 2020-07-31 DIAGNOSIS — D509 Iron deficiency anemia, unspecified: Principal | ICD-10-CM

## 2020-07-31 DIAGNOSIS — D801 Nonfamilial hypogammaglobulinemia: Principal | ICD-10-CM

## 2020-08-01 ENCOUNTER — Encounter: Admit: 2020-08-01 | Discharge: 2020-08-02 | Payer: MEDICARE

## 2020-08-01 ENCOUNTER — Ambulatory Visit: Admit: 2020-08-01 | Discharge: 2020-08-02 | Payer: MEDICARE

## 2020-08-01 ENCOUNTER — Encounter: Admit: 2020-08-01 | Discharge: 2020-08-02 | Payer: MEDICARE | Attending: Family | Primary: Family

## 2020-08-01 DIAGNOSIS — Z48298 Encounter for aftercare following other organ transplant: Principal | ICD-10-CM

## 2020-08-01 DIAGNOSIS — Z942 Lung transplant status: Principal | ICD-10-CM

## 2020-08-01 DIAGNOSIS — K219 Gastro-esophageal reflux disease without esophagitis: Principal | ICD-10-CM

## 2020-08-01 LAB — CBC W/ AUTO DIFF
BASOPHILS ABSOLUTE COUNT: 0 10*9/L (ref 0.0–0.1)
BASOPHILS RELATIVE PERCENT: 0.2 %
EOSINOPHILS ABSOLUTE COUNT: 0 10*9/L (ref 0.0–0.5)
EOSINOPHILS RELATIVE PERCENT: 1 %
HEMATOCRIT: 36 % — ABNORMAL LOW (ref 39.0–48.0)
HEMOGLOBIN: 12.3 g/dL — ABNORMAL LOW (ref 12.9–16.5)
LYMPHOCYTES ABSOLUTE COUNT: 0.7 10*9/L — ABNORMAL LOW (ref 1.1–3.6)
LYMPHOCYTES RELATIVE PERCENT: 20.5 %
MEAN CORPUSCULAR HEMOGLOBIN CONC: 34.1 g/dL (ref 32.0–36.0)
MEAN CORPUSCULAR HEMOGLOBIN: 31.4 pg (ref 25.9–32.4)
MEAN CORPUSCULAR VOLUME: 92 fL (ref 77.6–95.7)
MEAN PLATELET VOLUME: 6.9 fL (ref 6.8–10.7)
MONOCYTES ABSOLUTE COUNT: 0.2 10*9/L — ABNORMAL LOW (ref 0.3–0.8)
MONOCYTES RELATIVE PERCENT: 6.2 %
NEUTROPHILS ABSOLUTE COUNT: 2.6 10*9/L (ref 1.8–7.8)
NEUTROPHILS RELATIVE PERCENT: 72.1 %
PLATELET COUNT: 160 10*9/L (ref 150–450)
RED BLOOD CELL COUNT: 3.92 10*12/L — ABNORMAL LOW (ref 4.26–5.60)
RED CELL DISTRIBUTION WIDTH: 20.5 % — ABNORMAL HIGH (ref 12.2–15.2)
WBC ADJUSTED: 3.7 10*9/L (ref 3.6–11.2)

## 2020-08-01 LAB — CMV DNA, QUANTITATIVE, PCR: CMV VIRAL LD: NOT DETECTED

## 2020-08-01 LAB — COMPREHENSIVE METABOLIC PANEL
ALBUMIN: 3.9 g/dL (ref 3.4–5.0)
ALKALINE PHOSPHATASE: 95 U/L (ref 46–116)
ALT (SGPT): 97 U/L — ABNORMAL HIGH (ref 10–49)
ANION GAP: 12 mmol/L (ref 5–14)
AST (SGOT): 56 U/L — ABNORMAL HIGH (ref <=34)
BILIRUBIN TOTAL: 0.3 mg/dL (ref 0.3–1.2)
BLOOD UREA NITROGEN: 44 mg/dL — ABNORMAL HIGH (ref 9–23)
BUN / CREAT RATIO: 32
CALCIUM: 9.7 mg/dL (ref 8.7–10.4)
CHLORIDE: 101 mmol/L (ref 98–107)
CO2: 25 mmol/L (ref 20.0–31.0)
CREATININE: 1.37 mg/dL — ABNORMAL HIGH
EGFR CKD-EPI AA MALE: 60 mL/min/{1.73_m2} (ref >=60)
EGFR CKD-EPI NON-AA MALE: 52 mL/min/{1.73_m2} — ABNORMAL LOW (ref >=60)
GLUCOSE RANDOM: 82 mg/dL (ref 70–99)
POTASSIUM: 4.4 mmol/L (ref 3.4–4.8)
PROTEIN TOTAL: 6.6 g/dL (ref 5.7–8.2)
SODIUM: 138 mmol/L (ref 135–145)

## 2020-08-01 LAB — IRON PANEL
IRON SATURATION: 17 %
IRON: 52 ug/dL — ABNORMAL LOW
TOTAL IRON BINDING CAPACITY: 304 ug/dL (ref 250–425)

## 2020-08-01 LAB — MAGNESIUM: MAGNESIUM: 1.5 mg/dL — ABNORMAL LOW (ref 1.6–2.6)

## 2020-08-01 LAB — FERRITIN: FERRITIN: 607.8 ng/mL — ABNORMAL HIGH

## 2020-08-01 LAB — IGG: GAMMAGLOBULIN; IGG: 486 mg/dL — ABNORMAL LOW (ref 646–2013)

## 2020-08-01 LAB — TACROLIMUS LEVEL, TROUGH: TACROLIMUS, TROUGH: 13.1 ng/mL (ref 5.0–15.0)

## 2020-08-01 LAB — PHOSPHORUS: PHOSPHORUS: 2.7 mg/dL (ref 2.4–5.1)

## 2020-08-01 MED ORDER — PEN NEEDLE, DIABETIC 32 GAUGE X 5/32" (4 MM)
SUBCUTANEOUS | 11 refills | 30 days | Status: CP
Start: 2020-08-01 — End: ?
  Filled 2020-08-05: qty 200, 40d supply, fill #0

## 2020-08-01 MED ORDER — INSULIN ASPART (U-100) 100 UNIT/ML (3 ML) SUBCUTANEOUS PEN
2 refills | 0 days | Status: CP
Start: 2020-08-01 — End: ?
  Filled 2020-08-05: qty 15, 75d supply, fill #0

## 2020-08-01 MED ORDER — OSELTAMIVIR 75 MG CAPSULE
ORAL | 0 days
Start: 2020-08-01 — End: 2020-08-31

## 2020-08-01 MED ORDER — AZATHIOPRINE 50 MG TABLET
ORAL_TABLET | Freq: Every day | ORAL | 3 refills | 90 days | Status: CP
Start: 2020-08-01 — End: 2021-08-01
  Filled 2020-08-05: qty 135, 90d supply, fill #0

## 2020-08-01 MED ORDER — PANTOPRAZOLE 40 MG TABLET,DELAYED RELEASE
ORAL_TABLET | Freq: Every day | ORAL | 3 refills | 90 days | Status: CP
Start: 2020-08-01 — End: 2020-10-30
  Filled 2020-08-18: qty 90, 90d supply, fill #0

## 2020-08-01 MED ORDER — PRAVASTATIN 40 MG TABLET
ORAL_TABLET | 11 refills | 0 days
Start: 2020-08-01 — End: ?

## 2020-08-01 MED ORDER — TACROLIMUS 0.5 MG CAPSULE, IMMEDIATE-RELEASE
ORAL_CAPSULE | 3 refills | 0 days
Start: 2020-08-01 — End: ?

## 2020-08-01 MED ADMIN — tixagevimab-cilgavimab 150 mg/1.5 mL- 150 mg/1.5 mL injection 3 mL: 3 mL | INTRAMUSCULAR | @ 14:00:00 | Stop: 2020-08-01

## 2020-08-01 NOTE — Unmapped (Signed)
LUNG TRANSPLANT SOCIAL WORK NOTE      Met with Spencer Pena and wife Spencer Pena to check in on how they are doing at home. Spencer Pena says things are going well, but Spencer Pena keeps telling him he can't do things he wants to do. He is accustomed to doing all of the yard work and home maintenance himself, and they are having a hard time finding someone else to come out as far as they live to help. She has a nephew who lives 4 hours away who might help some. She has started using the riding lawnmower, but says she has never done yard work before she can't manage other equipment. Spencer Pena wants to crawl under the house to do something, and has heard clearly from [place] and the team that he should not do that.    Emotional support offered. Let [name] know that in time some things will be back in his purview, with some risk mitigation strategies, but some things like crawling under the house should always be off limits to keep him safe.    Encouraged Spencer Pena to participate in the lung transplant class caregiver panel next week. She says she is willing, but doesn't have much to say as all has gone so well so far. Let her know that many people will be relieved just to hear her say that, as there is a spectrum of post-transplant experiences.    Met with patient in an exam room in the [facility]. He and his wife wore a mask during the entire visit. I wore a surgical mask and wrap-around eye protection and was within 6 feet of the patient/visitor during this interaction.     [name], LCSW, CCTSW  Transplant Case Manager  August 01, 2020 4:43 PM

## 2020-08-01 NOTE — Unmapped (Signed)
In with Dr. Dudley Major to see Spencer Mallet Sr..  Doing well, PFTs great today.  Will need IVIG for hypogam.  Decreasing Imuran today.  Getting catch up evusheld today.  Return to clinic in two weeks. Questions answered, verbalized understanding.  Questions answered, verbalized understanding.

## 2020-08-01 NOTE — Unmapped (Signed)
Please see pharmacist notes for documentation.

## 2020-08-01 NOTE — Unmapped (Signed)
Called Spencer Mallet Sr. to discuss lab results. Tacrolimus level was increased at 13.1 with a goal of 7-9. Medication regimen is currently 0.5 mg BID. Discussed with [name], CPP, dosing at this time decreased to  0.5 mg AM and 0.5 mg MWF PM . Repeat labs scheduled one week. Resulted labs reviewed. All questions answered. Pt verbalized understanding.

## 2020-08-01 NOTE — Unmapped (Signed)
[facility] CLINIC PHARMACY NOTE  08/01/2020   Spencer Pena Sr.  [id number]    Medication changes today:   1. Hold statin  2. IVIG for hypogam  3. Decrease PPI to daily  4. Decrease azathioprine to 75mg  daily  5. Increase AM aspart dose to 12 units for full-carbohydrate meals with no protein (discouraged these meals)    Education/Adherence tools provided today:  1. provided updated medication list  2. provided additional pill box education    Follow up items:  1. Posa washout next visit    Next visit with pharmacy in 1-2 weeks  ____________________________________________________________________      Spencer Mallet Sr. is a 70 y.o. 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     Other PMH significant for hypertension, T2DM, colon cancer s/p hemicolectomy and R hepatectomy for metastasis + chemotherapy (2007), BPH    Seen by pharmacy today for: pill box assessment and adherence education and medication management    CC:  Patient has no complaints today.     Vitals:    08/01/20 0853   BP: 135/72   Pulse: 99   Temp: 36.1 ??C (97 ??F)   SpO2: 100%       Allergies   Allergen Reactions   ??? Cetuximab Anaphylaxis     Chest pain/rapid heart rate/ BP dropped   ??? Nsaids (Non-Steroidal Anti-Inflammatory Drug)      Interaction with transplant medications   ??? Venom-Honey Bee Anaphylaxis   ??? Enalapril      Angioedema     ??? Pollen Extracts Other (See Comments)     Nasal stuffiness, coughing, sneezing     Medications reviewed in EPIC medication station and updated today by the clinical pharmacist practitioner. Medication list includes revisions made during today???s encounter.    Outpatient Encounter Medications as of 08/01/2020   Medication Sig Dispense Refill   ??? acetaminophen (TYLENOL EXTRA STRENGTH) 500 MG tablet Take 1-2 tablets (500-1,000 mg total) by mouth every six (6) hours as needed for pain. 180 tablet 0   ??? aspirin (ECOTRIN) 81 MG tablet Take 1 tablet (81 mg total) by mouth daily. 90 tablet 3   ??? azithromycin (ZITHROMAX) 250 MG tablet Take 1 tablet (250 mg total) by mouth daily. 30 tablet 11   ??? blood sugar diagnostic (ON CALL EXPRESS TEST STRIP) Strp Test daily before all meals/snacks and once before bedtime. 100 each 11   ??? calcium carbonate (CALCIUM 600) 1,500 mg (600 mg elem calcium) tablet Take 1 tablet (600 mg of elem calcium total) by mouth Two (2) times a day. 60 tablet 11   ??? cholecalciferol, vitamin D3 25 mcg, 1,000 units,, 1,000 unit (25 mcg) tablet Take 2 tablets (50 mcg total) by mouth daily. 100 tablet 11   ??? insulin ASPART (NOVOLOG FLEXPEN U-100 INSULIN) 100 unit/mL (3 mL) injection pen Inject 9 units with breakfast (12 units with high sugar breakfast), 9 units with afternoon meal, and 4 units with evening snack plus sliding scale 2 units for every 50 >150; max 50 units/day 15 mL 2   ??? insulin glargine (BASAGLAR, LANTUS) 100 unit/mL (3 mL) injection pen Inject 0.2 mL (20 Units total) under the skin nightly. 15 mL 3   ??? lancets 30 gauge Misc Test daily before all meals/snacks and once before bedtime. 100 each 11   ??? magnesium oxide (MAG-OX) 400 mg (241.3 mg elemental magnesium) tablet Take 2 tablets (800 mg total) by mouth Two (2) times  a day. 120 tablet 11   ??? metFORMIN (GLUCOPHAGE) 1000 MG tablet Take 1 tablet by mouth twice daily 180 tablet 0   ??? pen needle, diabetic (BD ULTRA-FINE NANO PEN NEEDLE) 32 gauge x 5/32 (4 mm) Ndle Inject 1 each under the skin five (5) times a day. 150 each 11   ??? posaconazole (NOXAFIL) 100 mg delayed released tablet Take 3 tablets (300 mg total) by mouth daily. 21 tablet 3   ??? pravastatin (PRAVACHOL) 40 MG tablet HOLD 30 tablet 11   ??? predniSONE (DELTASONE) 5 MG tablet Take 4 tablets (20 mg total) by mouth daily. 120 tablet 11   ??? sulfamethoxazole-trimethoprim (BACTRIM) 400-80 mg per tablet Take 1 tablet (80 mg of trimethoprim total) by mouth Every Monday, Wednesday, and Friday. 36 tablet 3   ??? tacrolimus (PROGRAF) 0.5 MG capsule Take 1 capsule (0.5 mg total) by mouth two (2) times a day. 180 capsule 3   ??? terazosin (HYTRIN) 1 MG capsule Take 2 capsules (2 mg total) by mouth nightly. 60 capsule 11   ??? valGANciclovir (VALCYTE) 450 mg tablet Take 1 tablet (450 mg total) by mouth daily. 30 tablet 11   ??? [DISCONTINUED] azaTHIOprine (IMURAN) 50 mg tablet Take 2 tablets (100 mg total) by mouth daily. 60 tablet 11   ??? [DISCONTINUED] insulin ASPART (NOVOLOG FLEXPEN U-100 INSULIN) 100 unit/mL (3 mL) injection pen Inject 8 units with breakfast, 8 units with afternoon meal, and 4 units with evening snack plus sliding scale 2 units for every 50 >150; max 40 units/day 15 mL 2   ??? [DISCONTINUED] oseltamivir (TAMIFLU) 30 MG capsule Take 1 capsule (30 mg total) by mouth daily. 30 capsule 2   ??? [DISCONTINUED] pantoprazole (PROTONIX) 40 MG tablet Take 1 tablet (40 mg total) by mouth Two (2) times a day. 60 tablet 11   ??? [DISCONTINUED] pen needle, diabetic (BD ULTRA-FINE NANO PEN NEEDLE) 32 gauge x 5/32 (4 mm) Ndle Inject 1 each under the skin four (4) times a day. 200 each 3   ??? [DISCONTINUED] pravastatin (PRAVACHOL) 40 MG tablet Take 1 tablet (40 mg total) by mouth daily. 30 tablet 11   ??? oseltamivir (TAMIFLU) 75 MG capsule Take 1 capsule (75 mg total) by mouth every other day.       No facility-administered encounter medications on file as of 08/01/2020.       Immunosuppression:  Induction agent: basiliximab    Current immunosuppression:  ??? tacrolimus 0.5 mg BID  o Prograf goal: 8-10  ??? Azathioprine 100 mg daily - changed from MMF 2/2 diarrhea  ??? prednisone 20 mg daily    Patient is tolerating immunosuppression well.     IMMUNOSUPPRESSION DRUG LEVELS:  Lab Results   Component Value Date    Tacrolimus, Trough 5.3 07/24/2020    Tacrolimus, Trough 11.8 07/18/2020    Tacrolimus, Trough 10.9 07/11/2020     Tacrolimus level is accurate 12 hour trough    Graft function assessment via PFTs: improving   DSA: last checked 06/13/20 - NTD  Biopsies to date: none  WBC/ANC:  wnl    Plan: Will decrease azathioprine to 75mg  daily given low IgG, age, and MFI all zero on DSAs; further tac changes pending tac level    OI prophylaxis:   CMV Status: D+/ R-, high risk. CMV prophylaxis with valganciclovir 450 mg daily x 12 months per protocol.  Estimated Creatinine Clearance: 47.8 mL/min (A) (based on SCr of 1.37 mg/dL (H)).  PCP: Prophylaxis with bactrim SS 1  tab MWF indefinitely.  Fungal: Currently on posaconazole 300 mg daily for prophylaxis x 3 months per protocol.  ?? Last trough: 992 (06/27/20)  ?? Goal: > 700 ng/ml   Flu (seasonal): oseltamivir (Tamiflu) 75 mg every other day    Patient is tolerating infectious prophylaxis well.     Plan: Continue per protocol.     Infectious History:  Patient does not have a history of pathogenic infectious complications following lung transplant. 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 Previous antiinfective history use includes meropenem x7 days for broad spectrum gram negative and aspiration PNA coverage post-op per ICID.     Plan: Continue to monitor.    BOS prophylaxis:  Current meds include: azithromycin 250 mg daily and pantoprazole 40 mg BID  Pt denies reflux/heartburn. Esophageal motility study scheduled for 07/23/20. Endoscopy on 06/26/20. Normal findings with biopsy results pending. Demeester 16    Plan: decrease PPI to daily    CV Prophylaxis:   Aspirin: asa 81 mg daily  The 10-year ASCVD risk score Denman George DC Jr., et al., 2013) is: 28.5%  Statin: pravastatin; currently on 40 mg daily.    Plan: Continue aspirin, hold statin given rising LFTs    BP/Edema: Goal < 140/90. Encounter vitals reported above.  Home BP ranges: 110s-120s/70s  Weights: down due to lack of appetite  Current meds include: terazosin 2 mg at bedtime for BPH    Plan: BP within goal per home readings.     Anemia:  H/H:   Lab Results   Component Value Date    HGB 12.3 (L) 08/01/2020     Lab Results   Component Value Date    HCT 36.0 (L) 08/01/2020     Iron panel:  Lab Results Component Value Date    IRON 52 (L) 08/01/2020    TIBC 304 08/01/2020    FERRITIN 607.8 (H) 08/01/2020     Lab Results   Component Value Date    Iron Saturation (%) 17 08/01/2020     Prior anemia therapy: IV Ferrlecit (1/10-1/13)    Plan: H/H improved since discharge, repeat iron panel today WNL. Continue to monitor.     DM:   Lab Results   Component Value Date    A1C 6.0 (H) 06/13/2020   Goal A1c < 7%.  Current meds include:  ?? Insulin Glargine 20 units daily in AM  ?? Novolog SSI 2:50>150  ?? Novolog units 9/9/4  ?? Metformin 1000 mg PO BID  ?? HOLDING home glipizide    Home BS log: Fastings consistently <120 (no lows), lunch values mostly high, upon review with pt only with full carbohydrate rich breakfasts like waffles, pancakes, etc; BG better controlled with protein+carb breakfasts    Patient eats a small snack as dinner around 8-9 pm and then goes to bed approximately one hour after that. Unable to check bedtime BG as his pre-dinner BG is close to bedtime as it is. Highly variable PO intake  Exercise: walking 100 ft distance 10-20 times every day  Hypoglycemia: no  Plan: highly variable BG; continue current insulin regimen with adjustment of AM aspart to 12 units for full carbohydrate only breakfasts (cereal, waffles, pancakes, etc); encouraged more protein rich breakfasts    Electrolytes: wnl  Current meds include: MgOx 800 mg BID    Plan: Continue as above.     GI/BM: no issues   Current meds include: none; previously on ondansetron, Miralax, and senna    Plan: Continue to monitor.  Pain: no pain   Current meds include: APAP 1000 mg TID PRN    Plan: continue    Bone health:  Patient currently on long-term steroid therapy.  Vitamin D Level: 51.7 (3/22). Goal > 30.   Last DEXA results (02/2020 pre-transplant): normal bone density  Current meds include: vitamin D3 2000 units daily, calcium carbonate 600 mg BID    Plan: continue     Women's/Men's Health:  MARQUEL POTTENGER Sr. is a 70 y.o. male. Patient reports history of BPH. Reports that BPH symptoms are controlled.  Current meds include: terazosin 2 mg PO nightly    Plan: Continue terazosin to 2mg  qHS . Continue to monitor.    Adherence:   Patient has poor understanding of medications.  Patient does not fill their own pill box on a regular basis at home. Wife currently fills.  Patient brought medication card: yes  Pill box: was correct  Patient requested refills for the following meds: None  Corrections needed in Epic medication list: none     Plan: Provided moderate adherence counseling/intervention    I spent a total of 20 minutes face to face with the patient delivering clinical care and providing education/counseling.    Patient was reviewed with Dr. Dudley Major who was agreement with the stated plan.    During this visit, the following was completed:   BG log data assessment  BP log data assessment  Labs ordered and evaluated  complex treatment plan >1 DS   Patient education was completed for 25-60 minutes     All questions/concerns were addressed to the patient's satisfaction.  __________________________________________  PATIENT SEEN AND EVALUATED BY:  Master Touchet TEETER Shelda Truby, PHARMD, BCPS, CPP  [employer] TRANSPLANT PHARMACIST PRACTITIONER  PAGER [phone]

## 2020-08-01 NOTE — Unmapped (Signed)
4540 Patient arrived to the [facility]; Condition: well; Mobility: ambulating; accompanied by spouse for Evusheld (tixagevimab and cilgavimab) IM injections for Pre-exposure Prophylaxis of COVID-19. Fact sheet on Evusheld and EUA provided to patient, along with frequently asked questions sheet. Reviewed Information with patient, questions answered and patient verbalized understanding.   Patient verbalized consent to receive Evusheld injections.  Most recent platelet count 160.  Most recent Covid 19 Vaccine dose 07/18/2020.  Pt reports feeling well, denies having symptoms of Covid19.  0853 Pre injection VS stable.  1004 Evusheld (tixagevimab and cilgavimab) IM injections given; one in left ventrogluteal, other in right ventrogluteal; see MAR for details. One hour post injection observation period begun.    1104 One hour post Evusheld injections observation period completed. Patient tolerated Evusheld injections well.   1113  Post injection VS stable.  1115  Patient discharged from the [facility] in stable condition; Mobility: ambulating; accompanied by spouse.

## 2020-08-02 NOTE — Unmapped (Signed)
Asked by Transplant Coordinator to see patient in clinic today and provide printed handouts previously sent via MyChart. Provided these handouts and reviewed as well as two books - Calorie Brooke Dare and [employer] 4 Care Meal Planning book. Patient's Wife reports she couldn't read his phone to figure out carbohydrates in foods so he hasn't been consuming the amounts we discussed. They also had a few food safety questions which were answered.     Nolon Nations, MS, RD, LD, CCTD  Heart/Lung Transplant, VAD Dietitian  pgr [phone]; ph [phone]

## 2020-08-03 LAB — EBV QUANTITATIVE PCR, BLOOD: EBV VIRAL LOAD RESULT: NOT DETECTED

## 2020-08-05 ENCOUNTER — Encounter
Admit: 2020-08-05 | Discharge: 2020-08-14 | Payer: MEDICARE | Attending: Rehabilitative and Restorative Service Providers" | Primary: Rehabilitative and Restorative Service Providers"

## 2020-08-05 MED FILL — NOVOLOG FLEXPEN U-100 INSULIN ASPART 100 UNIT/ML (3 ML) SUBCUTANEOUS: 30 days supply | Qty: 15 | Fill #0

## 2020-08-05 NOTE — Unmapped (Unsigned)
OUTPATIENT PHYSICAL THERAPY PULMONARY   DAILY TREATMENT NOTE  Date3/29/2022  Patient Name: Spencer Pena.  Date of Birth:[birth year]  Visit #:  12  Diagnosis:   Encounter Diagnoses   Name Primary?   ??? Impaired mobility and endurance Yes   ??? Physical deconditioning    ??? Lung transplant recipient (CMS-HCC)    ??? Pre-transplant evaluation for lung transplant    ??? Type 2 diabetes mellitus without complication, without long-term current use of insulin (CMS-HCC)    ??? S/P lung transplant (CMS-HCC)      Date of onset:  05/15/2020  Date of Eval: 06/04/2020  Referring MD: Reggie Pile Dermot   POC Dates: 06/04/2020 - 09/02/2020    ASSESSMENT:    70 y.o. male presents status post bilateral lung transplant which is limiting his ability to ambulate community distances and perform work-related tasks independently without experiencing fatigue, loss of balance or shortness of breath.  Patient requires skilled Physical Therapy services for generalized conditioning, gross BLE strengthening and exercises to improve cardiovascular endurance. During the PT evaluation, patient was able to ambulate 1,337 ft during a , however he reported a 6RPE on the modified BORG's scale, indicating moderately hard exertion while ambulating. Patient is unable to maintain static standing balance with vision-denied, and is unable to independently navigate compliant surfaces. He will benefit from neuromuscular reeducation to improve balance. Patient is highly motivated to return to [facility] and will benefit from skilled physical therapy.      Today's session focused on LE strengthening. Patient tolerated treatment well. He benefited from regular rest breaks. Was able to work up to  at 11x1045lb on leg press.     Next session: Reassess next session with discharge planning.      Long Term Goals:  In 8 weeks:  1.   Ambulate 2,000 feet in 6 minutes for improved functional activity tolerance. ONGOING 07/16/20    2.   Increase exercise tolerance to 30 minutes of sitting/standing exercise with modified BORG score of 3. MET 07/16/20    3.   Demonstrate decreased SOB with activity as demonstrated by a SOBQ score of 10% for improved quality of life. ONGOING 07/16/20    4.   Patient will be independent with home program and/or transition into community exercise program. MET 07/16/20      PLAN:   Treatment Plan and Educational Needs (check all that apply):  x Cough techniques    Acapella/Vest    Postural drainage/Percussion   x Breathing retraining   x Postural exercises    Footwear education   x General conditioning   x Progressive ambulation   x Strengthening   x Stretching   x Home exercise program   x Benefits of exercise    Lung anatomy and physiology/pathology    Infection prevention/treatment    Psychosocial education/support group    Breathing/relaxation techniques    Airway clearance    Healthy eating    Smoking cessation    Other        Pt. will participate in the following as needed:    x Manual Therapy   x Therapeutic Activities   x Therapeutic Exercise   x Neuromuscular Education    Self Care   x Postural Exercises/education    Unattended estim    taping    Scar massage    Therapeutic Dry Needling    Hot/Cold modalities    Cold Laser    Education       Planned frequency  of treatment:  3x / week    Planned duration of treatment: 8 weeks      SUBJECTIVE:  No new complaints; denies pain. Reports GI upset two days ago, but states that symptoms have resolved.     Current Pain:  0/10     Patient Goals:  Improve strength, Improve ambulation, Return to recreational activities, Return to work and Improve overhead reaching    OBJECTIVE :  Vitals: HR 104bpm, BP: 144/43mmHg, O2: 98%      There Ex: 40 min  Leg press:  55# x 10  105# x 10  120# x 5  135# 3 x 5    Forward and retro walking, slow and fast speed with verbal cues, 2x300'.     Eccentric sit to stands, 2x10    Sit to stand with feet on rocker board 2 x 10 from raised mat table, progressed to low mat table     Lateral step ups 7 step x 10 each side no UE support    Heel raises 2 x 10    Functional Test/Outcome Measures:  30 second chair stand 18 repetitions with BUE (from evaluation)  30 second chair stand 14 repetition without BUE (reassessment)    6 MWT distance 1,371 ft, sp02 98, HR 120s, AD used none at this time, BORG rate of perceived exertion 2-3    SOBQ score 12%    Treatment Rendered:    Total Treatment Time: 40 min    HEP:  Tries to walk 2000' inside his house daily.  Incentive spirometer  Sit to stand from chair  Standing heel lifts 2 x20  Standing hip abduction and extension 2 x10-15  Standing mini-squat 2 x10  Feet together, eyes closed, 7 heads with 10-sec hold    I attest that I have reviewed the above information.  Signed: Tona Sensing, PT, DPT  08/05/2020 11:20 AM work and Improve overhead reaching    OBJECTIVE :  Vitals: HR 104bpm, BP: 144/37mmHg, O2: 98%      There Ex: 40 min  Leg press:  55# x 10  105# x 10  120# x 5  135# 3 x 5    Step throughs at 7 step x 10 each side    Sit to stand with feet on rocker board 2 x 10 from raised mat table, progressed to low mat table     Lateral step ups 7 step x 10 each side no UE support    Heel raises 2 x 10    Standing hip abduction and extension x 10 each direction each LE      Functional Test/Outcome Measures:  30 second chair stand 18 repetitions with BUE (from evaluation)  30 second chair stand 14 repetition without BUE (reassessment)    6 MWT distance 1,371 ft, sp02 98, HR 120s, AD used none at this time, BORG rate of perceived exertion 2-3    SOBQ score 12%    Treatment Rendered:    Total Treatment Time: 40 min    HEP:  Tries to walk 2000' inside his house daily.  Incentive spirometer  Sit to stand from chair  Standing heel lifts 2 x20  Standing hip abduction and extension 2 x10-15  Standing mini-squat 2 x10  Feet together, eyes closed, 7 heads with 10-sec hold    I attest that I have reviewed the above information.  Signed: Tona Sensing, PT, DPT  08/05/2020 11:20 AM

## 2020-08-06 LAB — HLA DS POST TRANSPLANT

## 2020-08-07 NOTE — Unmapped (Signed)
LUNG TRANSPLANT CLASS PARTICIPATION NOTE    Spencer Pena and wife [name] attended today's lung transplant class via [employer]. A panel of caregivers talked about their experiences post-transplant and answered questions. [name] participated on the panel and offered a lot of great information.    [name], LCSW, CCTSW  Transplant Case Manager  August 07, 2020 3:55 PM

## 2020-08-08 ENCOUNTER — Encounter: Admit: 2020-08-08 | Discharge: 2020-08-09 | Payer: MEDICARE

## 2020-08-08 LAB — COMPREHENSIVE METABOLIC PANEL
ALBUMIN: 4.1 g/dL (ref 3.4–5.0)
ALKALINE PHOSPHATASE: 94 U/L (ref 46–116)
ALT (SGPT): 137 U/L — ABNORMAL HIGH (ref 10–49)
ANION GAP: 6 mmol/L (ref 5–14)
AST (SGOT): 43 U/L — ABNORMAL HIGH (ref <=34)
BILIRUBIN TOTAL: 0.4 mg/dL (ref 0.3–1.2)
BLOOD UREA NITROGEN: 37 mg/dL — ABNORMAL HIGH (ref 9–23)
BUN / CREAT RATIO: 25
CALCIUM: 10.1 mg/dL (ref 8.7–10.4)
CHLORIDE: 106 mmol/L (ref 98–107)
CO2: 28 mmol/L (ref 20.0–31.0)
CREATININE: 1.48 mg/dL — ABNORMAL HIGH
EGFR CKD-EPI AA MALE: 55 mL/min/{1.73_m2} — ABNORMAL LOW (ref >=60)
EGFR CKD-EPI NON-AA MALE: 47 mL/min/{1.73_m2} — ABNORMAL LOW (ref >=60)
GLUCOSE RANDOM: 88 mg/dL (ref 70–99)
POTASSIUM: 4.7 mmol/L (ref 3.4–4.8)
PROTEIN TOTAL: 6.6 g/dL (ref 5.7–8.2)
SODIUM: 140 mmol/L (ref 135–145)

## 2020-08-08 LAB — CBC W/ AUTO DIFF
BASOPHILS ABSOLUTE COUNT: 0 10*9/L (ref 0.0–0.1)
BASOPHILS RELATIVE PERCENT: 0.2 %
EOSINOPHILS ABSOLUTE COUNT: 0 10*9/L (ref 0.0–0.5)
EOSINOPHILS RELATIVE PERCENT: 0.7 %
HEMATOCRIT: 36.8 % — ABNORMAL LOW (ref 39.0–48.0)
HEMOGLOBIN: 12.4 g/dL — ABNORMAL LOW (ref 12.9–16.5)
LYMPHOCYTES ABSOLUTE COUNT: 0.8 10*9/L — ABNORMAL LOW (ref 1.1–3.6)
LYMPHOCYTES RELATIVE PERCENT: 19 %
MEAN CORPUSCULAR HEMOGLOBIN CONC: 33.8 g/dL (ref 32.0–36.0)
MEAN CORPUSCULAR HEMOGLOBIN: 30.9 pg (ref 25.9–32.4)
MEAN CORPUSCULAR VOLUME: 91.4 fL (ref 77.6–95.7)
MEAN PLATELET VOLUME: 7.1 fL (ref 6.8–10.7)
MONOCYTES ABSOLUTE COUNT: 0.3 10*9/L (ref 0.3–0.8)
MONOCYTES RELATIVE PERCENT: 6.3 %
NEUTROPHILS ABSOLUTE COUNT: 3.1 10*9/L (ref 1.8–7.8)
NEUTROPHILS RELATIVE PERCENT: 73.8 %
PLATELET COUNT: 200 10*9/L (ref 150–450)
RED BLOOD CELL COUNT: 4.03 10*12/L — ABNORMAL LOW (ref 4.26–5.60)
RED CELL DISTRIBUTION WIDTH: 21 % — ABNORMAL HIGH (ref 12.2–15.2)
WBC ADJUSTED: 4.2 10*9/L (ref 3.6–11.2)

## 2020-08-08 LAB — TACROLIMUS LEVEL, TROUGH: TACROLIMUS, TROUGH: 10.8 ng/mL (ref 5.0–15.0)

## 2020-08-08 LAB — PHOSPHORUS: PHOSPHORUS: 2.1 mg/dL — ABNORMAL LOW (ref 2.4–5.1)

## 2020-08-08 LAB — IGG: GAMMAGLOBULIN; IGG: 528 mg/dL — ABNORMAL LOW (ref 646–2013)

## 2020-08-08 LAB — MAGNESIUM: MAGNESIUM: 1.6 mg/dL (ref 1.6–2.6)

## 2020-08-08 NOTE — Unmapped (Signed)
OUTPATIENT PHYSICAL THERAPY PULMONARY   DISCHARGE NOTE  Date4/05/2020  Patient Name: Spencer SWAYZE Sr.  Date of Birth:12/10/1950  Visit #:  13  Diagnosis:   Encounter Diagnoses   Name Primary?   ??? Impaired mobility and endurance Yes   ??? Physical deconditioning    ??? Lung transplant recipient (CMS-HCC)    ??? S/P lung transplant (CMS-HCC)      Date of onset:  05/15/2020  Date of Eval: 06/04/2020  Referring MD: Reggie Pile Dermot   POC Dates: 06/04/2020 - 09/02/2020    ASSESSMENT:    70 y.o. male presents status post bilateral lung transplant which is limiting his ability to ambulate community distances and perform work-related tasks independently without experiencing fatigue, loss of balance or shortness of breath.  Patient requires skilled Physical Therapy services for generalized conditioning, gross BLE strengthening and exercises to improve cardiovascular endurance. During the PT evaluation, patient was able to ambulate 1,337 ft during a , however he reported a 6RPE on the modified BORG's scale, indicating moderately hard exertion while ambulating. Patient is unable to maintain static standing balance with vision-denied, and is unable to independently navigate compliant surfaces. He will benefit from neuromuscular reeducation to improve balance. Patient is highly motivated to return to [facility] and will benefit from skilled physical therapy.      Today's session was to reassess and discharge patient from skilled physical therapy. Patient's SOBQ score has improved from 12 to 2%. His 30 second sit to stand has increased to 15 repetitions and his five times sit to stand is 9.90 sec, indicating no risk of falling. His 1, 425ft with rate of perceived exertion on the modified BORG was 2-3 throughout. Patient to be discharged from skilled physical therapy at this time secondary to independence with all functional mobility without signs of fatigue.     Long Term Goals:  In 8 weeks:  1.   Ambulate 2,000 feet in 6 minutes for improved functional activity tolerance. Patient able to ambulate 1,400 feet in 6 minutes at discharge.     2.   Increase exercise tolerance to 30 minutes of sitting/standing exercise with modified BORG score of 3. MET 07/16/20    3.   Demonstrate decreased SOB with activity as demonstrated by a SOBQ score of 10% for improved quality of life. MET 08/08/20    4.   Patient will be independent with home program and/or transition into community exercise program. MET 07/16/20      PLAN: Patient to be discharged from skilled physical therapy at this time secondary to independence with HEP and meeting physical therapy goals.       SUBJECTIVE:  I feel 95% improvement since initial evaluation. No new complaints; denies pain. Reports feeling indigestion this morning secondary to not being able to eat before blood work.    Current Pain:  0/10     Patient Goals:  Improve strength, Improve ambulation, Return to recreational activities, Return to work and Improve overhead reaching    OBJECTIVE :  Vitals: HR 104bpm, BP: 144/71mmHg, O2: 98%      PPTM: 25 min  Functional Test/Outcome Measures:  30 second chair stand 14 repetition without BUE (reassessment)  30 second chair stand 15 repetition without BUE (discharge)    Five times sit to stand: 9.9 sec (discharge)    6 MWT distance 1,371 ft, sp02 98, HR 120s, AD used none at this time, modified BORG rate of perceived exertion 2-3  6 MWT distance, 1,400 ft,  sp)2 98%, HR 120s, independent walking, modified BORG rate of perceived exertion 2-3 throughout.     SOBQ score 12% (reassessment)  SOBQ score 2% (discharge)    Self Care: 10 min  Discussed discharge and HEP to be continued.     Treatment Rendered:    Total Treatment Time: 35 min    I attest that I have reviewed the above information.  Signed: Tona Sensing, PT, DPT  08/08/2020 8:56 AM

## 2020-08-08 NOTE — Unmapped (Signed)
Called [name] to discuss lab results. Tacrolimus level was increased at 10.8 with a goal of 7-9. Medication regimen is currently 0.5 mg AM and 0.5 mg MWF PM. Discussed with [name], CPP, dosing at this time to remain the same as his BUN and CR. Are stable. Repeat labs scheduled 1 month. Resulted labs reviewed. All questions answered. Pt verbalized understanding.

## 2020-08-09 LAB — CMV DNA, QUANTITATIVE, PCR: CMV VIRAL LD: NOT DETECTED

## 2020-08-09 LAB — EBV QUANTITATIVE PCR, BLOOD: EBV VIRAL LOAD RESULT: NOT DETECTED

## 2020-08-11 NOTE — Unmapped (Signed)
[facility] Refill Coordination Note    Specialty Medication(s) to be Shipped:   Transplant: valgancyclovir 450mg  and Prednisone 5mg     Other medication(s) to be shipped:      Azithromycin  Calcium carb  Vitamin D3  Mag Ox  Test Strips  Lancet  Pantoprazole  Terazosin       Spencer MCCHRISTIAN Sr., DOB: 17-Oct-1950  Phone: [phone] (home) [phone] (work)      All above HIPAA information was verified with patient's caregiver, wife     Was a Nurse, learning disability used for this call? No    Completed refill call assessment today to schedule patient's medication shipment from the [facility] [phone]).       Specialty medication(s) and dose(s) confirmed: Regimen is correct and unchanged.   Changes to medications: Eliyahu reports no changes at this time.  Changes to insurance: No  Questions for the pharmacist: No    Confirmed patient received a Conservation officer, historic buildings and a Surveyor, mining with first shipment. The patient will receive a drug information handout for each medication shipped and additional FDA Medication Guides as required.       DISEASE/MEDICATION-SPECIFIC INFORMATION        N/A    SPECIALTY MEDICATION ADHERENCE     Medication Adherence    Patient reported X missed doses in the last month: 0  Specialty Medication: valGANciclovir (VALCYTE) 450 mg tablet  Patient is on additional specialty medications: Yes  Additional Specialty Medications: tacrolimus (PROGRAF) 0.5 MG capsule  Patient Reported Additional Medication X Missed Doses in the Last Month: 0         valgancyclovir 450mg    10  days worth of medication on hand.  Prednisone 5mg   10 days worth of medication on hand.              SHIPPING     Shipping address confirmed in Epic.     Delivery Scheduled: Yes, Expected medication delivery date: 08/19/20.     Medication will be delivered via UPS to the prescription address in Epic WAM.    Spencer Pena   [facility] Specialty Technician

## 2020-08-12 NOTE — Unmapped (Signed)
Outgoing call to [employer] Sr[employer] to let him know insurance will not cover IVIG and his last IgG was increased to [phone].  Will reschedule clinic visit to Wednesday.  Questions answered, verbalized understanding.

## 2020-08-13 DIAGNOSIS — Z942 Lung transplant status: Principal | ICD-10-CM

## 2020-08-18 MED FILL — MAGNESIUM OXIDE 400 MG (241.3 MG MAGNESIUM) TABLET: ORAL | 30 days supply | Qty: 120 | Fill #2

## 2020-08-18 MED FILL — AZITHROMYCIN 250 MG TABLET: ORAL | 30 days supply | Qty: 30 | Fill #2

## 2020-08-18 MED FILL — ON CALL EXPRESS TEST STRIP: 20 days supply | Qty: 100 | Fill #1

## 2020-08-18 MED FILL — CALCIUM CARBONATE 600 MG CALCIUM (1,500 MG) TABLET: ORAL | 30 days supply | Qty: 60 | Fill #2

## 2020-08-18 MED FILL — PREDNISONE 5 MG TABLET: ORAL | 30 days supply | Qty: 120 | Fill #2

## 2020-08-18 MED FILL — CHOLECALCIFEROL (VITAMIN D3) 25 MCG (1,000 UNIT) TABLET: ORAL | 50 days supply | Qty: 100 | Fill #1

## 2020-08-18 MED FILL — ON CALL LANCET 30 GAUGE: 20 days supply | Qty: 100 | Fill #1

## 2020-08-18 MED FILL — TERAZOSIN 1 MG CAPSULE: ORAL | 30 days supply | Qty: 60 | Fill #1

## 2020-08-18 MED FILL — VALGANCICLOVIR 450 MG TABLET: ORAL | 30 days supply | Qty: 30 | Fill #1

## 2020-08-20 ENCOUNTER — Encounter: Admit: 2020-08-20 | Discharge: 2020-08-21 | Payer: MEDICARE

## 2020-08-20 ENCOUNTER — Encounter: Admit: 2020-08-20 | Discharge: 2020-08-21 | Payer: PRIVATE HEALTH INSURANCE

## 2020-08-20 DIAGNOSIS — Z942 Lung transplant status: Principal | ICD-10-CM

## 2020-08-20 DIAGNOSIS — K219 Gastro-esophageal reflux disease without esophagitis: Principal | ICD-10-CM

## 2020-08-20 DIAGNOSIS — R197 Diarrhea, unspecified: Principal | ICD-10-CM

## 2020-08-20 LAB — COMPREHENSIVE METABOLIC PANEL
ALBUMIN: 3.8 g/dL (ref 3.4–5.0)
ALKALINE PHOSPHATASE: 97 U/L (ref 46–116)
ALT (SGPT): 135 U/L — ABNORMAL HIGH (ref 10–49)
ANION GAP: 7 mmol/L (ref 5–14)
AST (SGOT): 43 U/L — ABNORMAL HIGH (ref <=34)
BILIRUBIN TOTAL: 0.3 mg/dL (ref 0.3–1.2)
BLOOD UREA NITROGEN: 47 mg/dL — ABNORMAL HIGH (ref 9–23)
BUN / CREAT RATIO: 30
CALCIUM: 9.4 mg/dL (ref 8.7–10.4)
CHLORIDE: 104 mmol/L (ref 98–107)
CO2: 28 mmol/L (ref 20.0–31.0)
CREATININE: 1.58 mg/dL — ABNORMAL HIGH
EGFR CKD-EPI AA MALE: 50 mL/min/{1.73_m2} — ABNORMAL LOW (ref >=60)
EGFR CKD-EPI NON-AA MALE: 44 mL/min/{1.73_m2} — ABNORMAL LOW (ref >=60)
GLUCOSE RANDOM: 79 mg/dL (ref 70–99)
POTASSIUM: 4.4 mmol/L (ref 3.4–4.8)
PROTEIN TOTAL: 6.2 g/dL (ref 5.7–8.2)
SODIUM: 139 mmol/L (ref 135–145)

## 2020-08-20 LAB — CBC W/ AUTO DIFF
BASOPHILS ABSOLUTE COUNT: 0 10*9/L (ref 0.0–0.1)
BASOPHILS RELATIVE PERCENT: 0.1 %
EOSINOPHILS ABSOLUTE COUNT: 0 10*9/L (ref 0.0–0.5)
EOSINOPHILS RELATIVE PERCENT: 0.6 %
HEMATOCRIT: 37.2 % — ABNORMAL LOW (ref 39.0–48.0)
HEMOGLOBIN: 12.7 g/dL — ABNORMAL LOW (ref 12.9–16.5)
LYMPHOCYTES ABSOLUTE COUNT: 0.5 10*9/L — ABNORMAL LOW (ref 1.1–3.6)
LYMPHOCYTES RELATIVE PERCENT: 11.8 %
MEAN CORPUSCULAR HEMOGLOBIN CONC: 34.2 g/dL (ref 32.0–36.0)
MEAN CORPUSCULAR HEMOGLOBIN: 31.3 pg (ref 25.9–32.4)
MEAN CORPUSCULAR VOLUME: 91.5 fL (ref 77.6–95.7)
MEAN PLATELET VOLUME: 7.4 fL (ref 6.8–10.7)
MONOCYTES ABSOLUTE COUNT: 0.2 10*9/L — ABNORMAL LOW (ref 0.3–0.8)
MONOCYTES RELATIVE PERCENT: 5.8 %
NEUTROPHILS ABSOLUTE COUNT: 3.5 10*9/L (ref 1.8–7.8)
NEUTROPHILS RELATIVE PERCENT: 81.7 %
PLATELET COUNT: 145 10*9/L — ABNORMAL LOW (ref 150–450)
RED BLOOD CELL COUNT: 4.07 10*12/L — ABNORMAL LOW (ref 4.26–5.60)
RED CELL DISTRIBUTION WIDTH: 21 % — ABNORMAL HIGH (ref 12.2–15.2)
WBC ADJUSTED: 4.3 10*9/L (ref 3.6–11.2)

## 2020-08-20 LAB — EBV QUANTITATIVE PCR, BLOOD
EBV QUANT: <100 [IU]/mL — ABNORMAL HIGH (ref <0)
EBV VIRAL LOAD RESULT: DETECTED — AB

## 2020-08-20 LAB — IGG: GAMMAGLOBULIN; IGG: 451 mg/dL — ABNORMAL LOW (ref 646–2013)

## 2020-08-20 LAB — MAGNESIUM: MAGNESIUM: 1.7 mg/dL (ref 1.6–2.6)

## 2020-08-20 LAB — PHOSPHORUS: PHOSPHORUS: 2.2 mg/dL — ABNORMAL LOW (ref 2.4–5.1)

## 2020-08-20 LAB — TACROLIMUS LEVEL, TROUGH: TACROLIMUS, TROUGH: 11.5 ng/mL (ref 5.0–15.0)

## 2020-08-20 MED ORDER — PANTOPRAZOLE 40 MG TABLET,DELAYED RELEASE
ORAL_TABLET | Freq: Two times a day (BID) | ORAL | 3 refills | 90 days | Status: CP
Start: 2020-08-20 — End: ?

## 2020-08-20 MED ORDER — TACROLIMUS 1 MG CAPSULE, IMMEDIATE-RELEASE
ORAL_CAPSULE | Freq: Two times a day (BID) | ORAL | 11 refills | 30 days | Status: CP
Start: 2020-08-20 — End: ?
  Filled 2020-08-20: qty 120, 30d supply, fill #0

## 2020-08-20 NOTE — Unmapped (Signed)
Please see pharmacy visit for additional charge information.

## 2020-08-20 NOTE — Unmapped (Signed)
Addended by: Gust Brooms on: 08/20/2020 10:44 AM     Modules accepted: Orders

## 2020-08-20 NOTE — Unmapped (Signed)
In with Dr. Glenard Haring to see Spencer Pena. Overall doing well, PFTs stable.  Will stop posa today and get twice weekly labs.  Continues to have diarrhea will get c diff and GI pathogen panel.  Increase PPI to twice daily as is having symptomatic heartburn. Chest has redness, advised if it worsens or gets fever to call. Getting second Shingrix today.  Return to clinic in two weeks.

## 2020-08-20 NOTE — Unmapped (Signed)
[facility] PHARMACY NOTE  08/20/2020   Spencer Pena Sr.  [id number]    Medication changes today:   1. Stop posaconazole  2. Stop tamiflu  3. Increase tac to 0.5mg  BID x2 days, then 1mg  AM/0.5mg  PM x 3 days, labs Monday/Thursday at [facility] Spencer Pena  4. Shingrix #2 today  5. Increase pantoprazole to 40mg  BID    Education/Adherence tools provided today:  1. provided updated medication list  2. provided additional pill box education    Follow up items:  1. Posa washout  2. Drop pred to 15mg  daily at next visit once posa washout complete - adjust insulin    Next visit with pharmacy in 1-2 weeks  ____________________________________________________________________      Spencer Mallet Sr. is a 70 y.o. 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     Other PMH significant for hypertension, T2DM, colon cancer s/p hemicolectomy and R hepatectomy for metastasis + chemotherapy (2007), BPH    Seen by pharmacy today for: pill box assessment and adherence education and medication management    CC:  Patient has no complaints today.     Vitals:    08/20/20 0923   BP: 133/69   Pulse: 92   Temp: 36.5 ??C (97.7 ??F)       Allergies   Allergen Reactions   ??? Cetuximab Anaphylaxis     Chest pain/rapid heart rate/ BP dropped   ??? Nsaids (Non-Steroidal Anti-Inflammatory Drug)      Interaction with transplant medications   ??? Venom-Honey Bee Anaphylaxis   ??? Enalapril      Angioedema     ??? Pollen Extracts Other (See Comments)     Nasal stuffiness, coughing, sneezing     Medications reviewed in EPIC medication station and updated today by the clinical pharmacist practitioner. Medication list includes revisions made during today???s encounter.    Outpatient Encounter Medications as of 08/20/2020   Medication Sig Dispense Refill   ??? acetaminophen (TYLENOL EXTRA STRENGTH) 500 MG tablet Take 1-2 tablets (500-1,000 mg total) by mouth every six (6) hours as needed for pain. 180 tablet 0   ??? aspirin (ECOTRIN) 81 MG tablet Take 1 tablet (81 mg total) by mouth daily. 90 tablet 3   ??? azaTHIOprine (IMURAN) 50 mg tablet Take 1 and 1/2 tablets (75 mg total) by mouth daily. 135 tablet 3   ??? azithromycin (ZITHROMAX) 250 MG tablet Take 1 tablet (250 mg total) by mouth daily. 30 tablet 11   ??? blood sugar diagnostic (ON CALL EXPRESS TEST STRIP) Strp Test daily before all meals/snacks and once before bedtime. 100 each 11   ??? calcium carbonate (CALCIUM 600) 1,500 mg (600 mg elem calcium) tablet Take 1 tablet (600 mg of elem calcium total) by mouth Two (2) times a day. 60 tablet 11   ??? cholecalciferol, vitamin D3 25 mcg, 1,000 units,, 1,000 unit (25 mcg) tablet Take 2 tablets (50 mcg total) by mouth daily. 100 tablet 11   ??? insulin ASPART (NOVOLOG FLEXPEN U-100 INSULIN) 100 unit/mL (3 mL) injection pen Inject 9 units with breakfast (12 units with high sugar breakfast), 9 units with afternoon meal, and 4 units with evening snack plus sliding scale 2 units for every 50 >150; max 50 units/day 15 mL 2   ??? insulin glargine (BASAGLAR, LANTUS) 100 unit/mL (3 mL) injection pen Inject 0.2 mL (20 Units total) under the skin nightly. 15 mL 3   ??? lancets 30 gauge Misc Test daily  before all meals/snacks and once before bedtime. 100 each 11   ??? magnesium oxide (MAG-OX) 400 mg (241.3 mg elemental magnesium) tablet Take 2 tablets (800 mg total) by mouth Two (2) times a day. 120 tablet 11   ??? metFORMIN (GLUCOPHAGE) 1000 MG tablet Take 1 tablet by mouth twice daily 180 tablet 0   ??? pantoprazole (PROTONIX) 40 MG tablet Take 1 tablet (40 mg total) by mouth Two (2) times a day. 180 tablet 3   ??? pen needle, diabetic (BD ULTRA-FINE NANO PEN NEEDLE) 32 gauge x 5/32 (4 mm) Ndle Inject 1 each under the skin five (5) times a day. 150 each 11   ??? pravastatin (PRAVACHOL) 40 MG tablet HOLD 30 tablet 11   ??? predniSONE (DELTASONE) 5 MG tablet Take 4 tablets (20 mg total) by mouth daily. 120 tablet 11   ??? sulfamethoxazole-trimethoprim (BACTRIM) 400-80 mg per tablet Take 1 tablet (80 mg of trimethoprim total) by mouth Every Monday, Wednesday, and Friday. 36 tablet 3   ??? tacrolimus (PROGRAF) 1 MG capsule Take 2 capsules (2 mg total) by mouth two (2) times a day. 120 capsule 11   ??? terazosin (HYTRIN) 1 MG capsule Take 2 capsules (2 mg total) by mouth nightly. 60 capsule 11   ??? valGANciclovir (VALCYTE) 450 mg tablet Take 1 tablet (450 mg total) by mouth daily. 30 tablet 11   ??? [DISCONTINUED] oseltamivir (TAMIFLU) 75 MG capsule Take 1 capsule (75 mg total) by mouth every other day.     ??? [DISCONTINUED] pantoprazole (PROTONIX) 40 MG tablet Take 1 tablet (40 mg total) by mouth daily. 90 tablet 3   ??? [DISCONTINUED] posaconazole (NOXAFIL) 100 mg delayed released tablet Take 3 tablets (300 mg total) by mouth daily. 21 tablet 3   ??? [DISCONTINUED] tacrolimus (PROGRAF) 0.5 MG capsule 0.5 mg AM 0.5 mg on MWF evenings 90 capsule 3     Facility-Administered Encounter Medications as of 08/20/2020   Medication Dose Route Frequency Provider Last Rate Last Admin   ??? varicella-zoster [id number] (PF) (SHINGRIX) injection  0.5 mL Intramuscular During hospitalization Abelino Derrick, CPP           Immunosuppression:  Induction agent: basiliximab    Current immunosuppression:  ??? tacrolimus 0.5 mg Am/0.5mg  PM MWF  o Prograf goal: 8-10  ??? Azathioprine 75 mg daily - changed from MMF 2/2 diarrhea while inpatient; dose decreased from 100mg  to 75mg  on 08/01/20 given downtrending ANC and IgG  ??? prednisone 20 mg daily    Patient is tolerating immunosuppression well.     IMMUNOSUPPRESSION DRUG LEVELS:  Lab Results   Component Value Date    Tacrolimus, Trough 10.8 08/08/2020    Tacrolimus, Trough 13.1 08/01/2020    Tacrolimus, Trough 5.3 07/24/2020     Tacrolimus level is accurate 12 hour trough    Graft function assessment via PFTs: improving   DSA: NTD - last chck 07/18/20  Biopsies to date: none  WBC/ANC:  wnl    Plan: Will increase tacrolimus given posa washout,  0.5mg  BID x2 days, then 1mg  AM/0.5mg  PM x 3 days, labs Monday/Thursday at [facility]    OI prophylaxis:   CMV Status: D+/ R-, high risk. CMV prophylaxis with valganciclovir 450 mg daily x 12 months per protocol.  Estimated Creatinine Clearance: 41.4 mL/min (A) (based on SCr of 1.58 mg/dL (H)).  PCP: Prophylaxis with bactrim SS 1 tab MWF indefinitely.  Fungal: Currently on posaconazole 300 mg daily for prophylaxis x 3 months per protocol.  ??  Last trough: 992 (06/27/20)  ?? Goal: > 700 ng/ml   Flu (seasonal): oseltamivir (Tamiflu) 75 mg every other day    Patient is tolerating infectious prophylaxis well.     Plan: STOP posaconazole and tamiflu per protocol.     Infectious History:  Patient does not have a history of pathogenic infectious complications following lung transplant. 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 Previous antiinfective history use includes meropenem x7 days for broad spectrum gram negative and aspiration PNA coverage post-op per ICID.     Plan: Continue to monitor.    BOS prophylaxis:  Current meds include: azithromycin 250 mg daily and pantoprazole 40 mg daily  Pt endorses reflux/heartburn in AM since stopping PM dose. Esophageal motility study scheduled for 07/23/20. Endoscopy on 06/26/20. Normal findings with biopsy results pending. Demeester 16    Plan: increase PPI back to BID, discuss long term plan given bone health risk with BID PPI vs. Nissen    CV Prophylaxis:   Aspirin: asa 81 mg daily  The 10-year ASCVD risk score Denman George DC Jr., et al., 2013) is: 28.8%  Statin: pravastatin; currently on 40 mg daily.    Plan: Continue aspirin, hold statin given rising LFTs    BP/Edema: Goal < 140/90. Encounter vitals reported above.  Home BP ranges: 110s-120s/70s  Weights: stable  Current meds include: terazosin 2 mg at bedtime for BPH    Plan: BP within goal per home readings.     Anemia:  H/H:   Lab Results   Component Value Date    HGB 12.7 (L) 08/20/2020     Lab Results   Component Value Date    HCT 37.2 (L) 08/20/2020 Iron panel:  Lab Results   Component Value Date    IRON 52 (L) 08/01/2020    TIBC 304 08/01/2020    FERRITIN 607.8 (H) 08/01/2020     Lab Results   Component Value Date    Iron Saturation (%) 17 08/01/2020     Prior anemia therapy: IV Ferrlecit (1/10-1/13)    Plan: H/H improved since discharge, repeat iron panel today WNL. Continue to monitor.     DM:   Lab Results   Component Value Date    A1C 6.0 (H) 06/13/2020   Goal A1c < 7%.  Current meds include:  ?? Insulin Glargine 20 units daily in AM  ?? Novolog SSI 2:50>150  ?? Novolog units 9/9/4  ?? Metformin 1000 mg PO BID  ?? HOLDING home glipizide    Home BS log: Fast[place] consistently <100 (no lows), lunch values much improved with dietary changes to breakfast foods    Exercise: walking daily  Hypoglycemia: no  Plan: much improved glycemic control with dietary changes and current insuli regimen; will plan to decrease insulin empirically when pred decreased    Electrolytes: wnl  Current meds include: MgOx 800 mg BID    Plan: Continue as above.     GI/BM: no issues   Current meds include: none; previously on ondansetron, Miralax, and senna    Plan: Continue to monitor.     Pain: no pain   Current meds include: APAP 1000 mg TID PRN    Plan: continue    Bone health:  Patient currently on long-term steroid therapy.  Vitamin D Level: 51.7 (3/22). Goal > 30.   Last DEXA results (02/2020 pre-transplant): normal bone density  Current meds include: vitamin D3 2000 units daily, calcium carbonate 600 mg BID    Plan: continue  Women's/Men's Health:  Spencer Gaffin. is a 70 y.o. male. Patient reports history of BPH. Reports that BPH symptoms are controlled.  Current meds include: terazosin 2 mg PO nightly    Plan: Continue terazosin to 2mg  qHS . Continue to monitor.    Adherence:   Patient has average understanding of medications.  Patient does not fill their own pill box on a regular basis at home. Wife currently fills.  Patient brought medication card: yes  Pill box: was correct  Patient requested refills for the following meds: None  Corrections needed in Epic medication list: none     Plan: Provided moderate adherence counseling/intervention    I spent a total of 20 minutes face to face with the patient delivering clinical care and providing education/counseling.    Patient was reviewed with Dr.Coakley who was agreement with the stated plan.    During this visit, the following was completed:   BG log data assessment  BP log data assessment  Labs ordered and evaluated  complex treatment plan >1 DS   Patient education was completed for 25-60 minutes     All questions/concerns were addressed to the patient's satisfaction.  __________________________________________  PATIENT SEEN AND EVALUATED BY:  Keola Heninger TEETER Ragnar Waas, PHARMD, BCPS, CPP  [employer]ID [employer] TRANSPLANT PHARMACIST PRACTITIONER  PAGER [phone]

## 2020-08-20 NOTE — Unmapped (Signed)
Pt ID verified with Name and Date of birth. All screening questions answered.   Vaccine(s) administered as ordered. See Immunization history for documentation. Pt tolerated the injection(s) well with no issues noted. Vaccine Information Sheet given to patient.

## 2020-08-20 NOTE — Unmapped (Signed)
[facility]plant [facility]    HISTORY:   HPI:  70 y.o. 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, also with type 2 diabetes mellitus, hypertension, colon cancer in 2007 s/p partial colon and hepatic wedge resection and FOLFOX here for f/u,     Last seen in clinic 08/01/2020  - imuran dropped to 75 mg, PPI dropped to daily, statin held  - abnormal deMeester but holding off on surgery given normal PFT's     Today 08/20/2020   - remains hypogam (lvl 451)  but insurance will not approve IVIG   - graduated pulmonary rehab    - working in yard, walking daily   - no cough, fever, chills, night sweats   - weight steady   - trying to reduce carbs and add protein into diet    - fasting BGL 70-90's,   - Hazy red rash in shawl distribution over anterior chest    - noticed it last night, not itchy or painful, no lesions   - not wearing sunscreen when he's outside   - no clicking/instability of sternum   - GERD sx 1-2x/week usually in mornings   - Currently in [place] - 1hour away   - Diarrhea at night, no stomach pain, no N/V         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     --> basiliximab induction    --> CMV high risk (D+/R-)    --> EBV moderate risk (D+/R+)    --> Not Increased Risk Donor  - Colon Cancer 2007    --> s/p resection of liver mets    --> s/p resection of 6 inches of colon    --> FOLFOX Chemo  - type 2 diabetes mellitus     --> not insulin dependent  - OA    SOCIAL HISTORY:   - 20pk years, quit in 1991  - drinks about 10 drinks a week but will stop  - Works maintenance at [employer]  - Married and lives in [place]    FAMILY HISTORY:   - Parents lived into late 90's    MEDS:  (personally reviewed in EPIC, pertinent meds noted below)     Current Outpatient Medications   Medication Instructions   ??? acetaminophen (TYLENOL EXTRA STRENGTH) 500-1,000 mg, Oral, Every 6 hours PRN   ??? aspirin (ECOTRIN) 81 mg, Oral, Daily (standard)   ??? azaTHIOprine (IMURAN) 50 mg tablet Take 1 and 1/2 tablets (75 mg total) by mouth daily.   ??? azithromycin (ZITHROMAX) 250 mg, Oral, Daily (standard)   ??? BASAGLAR KWIKPEN U-100 INSULIN 20 Units, Subcutaneous, Nightly   ??? blood sugar diagnostic (ON CALL EXPRESS TEST STRIP) Strp Test daily before all meals/snacks and once before bedtime.   ??? calcium carbonate (CALCIUM 600) 1,500 mg (600 mg elem calcium) tablet 600 mg of elem calcium, Oral, 2 times a day (standard)   ??? cholecalciferol (vitamin D3 25 mcg (1,000 units)) 50 mcg, Oral, Daily (standard)   ??? insulin ASPART (NOVOLOG FLEXPEN U-100 INSULIN) 100 unit/mL (3 mL) injection pen Inject 9 units with breakfast (12 units with high sugar breakfast), 9 units with afternoon meal, and 4 units with evening snack plus sliding scale 2 units for every 50 >150; max 50 units/day   ??? lancets 30 gauge Misc Test daily before all meals/snacks and once before bedtime.   ??? magnesium oxide (MAG-OX) 800 mg, Oral, 2 times a day (standard)   ???  metFORMIN (GLUCOPHAGE) 1000 MG tablet Take 1 tablet by mouth twice daily   ??? pantoprazole (PROTONIX) 40 mg, Oral, 2 times a day (standard)   ??? pen needle, diabetic (BD ULTRA-FINE NANO PEN NEEDLE) 32 gauge x 5/32 (4 mm) Ndle 1 each, Subcutaneous, 5 times a day   ??? pravastatin (PRAVACHOL) 40 MG tablet HOLD   ??? predniSONE (DELTASONE) 20 mg, Oral, Daily (standard)   ??? sulfamethoxazole-trimethoprim (BACTRIM) 400-80 mg per tablet 80 mg of trimethoprim, Oral, Every Mon-Wed-Fri   ??? tacrolimus (PROGRAF) 2 mg, Oral, 2 times a day   ??? terazosin (HYTRIN) 2 mg, Oral, Nightly   ??? valGANciclovir (VALCYTE) 450 mg, Oral, Daily (standard)         OBJECTIVE DATA:   PHYSICAL EXAM:  BP 133/69 (BP Site: R Arm, BP Position: Sitting)  - Pulse 92  - Temp 36.5 ??C (Tympanic)  - Ht 171.5 cm (5' 7.52)  - Wt 69.6 kg (153 lb 6.4 oz)  - SpO2 99%  - BMI 23.66 kg/m??      Wt Readings from Last 12 Encounters:   08/20/20 69.6 kg (153 lb 6.4 oz)   08/07/20 68.5 kg (151 lb)   08/01/20 70.8 kg (156 lb 1.6 oz)   08/01/20 70.8 kg (156 lb 1.6 oz)   07/23/20 68.4 kg (150 lb 12.8 oz)   07/18/20 69.5 kg (153 lb 3.2 oz)   07/18/20 69.5 kg (153 lb 3.2 oz)   07/04/20 70.8 kg (156 lb)   07/04/20 70.8 kg (156 lb)   06/27/20 72.1 kg (159 lb)   06/27/20 72.1 kg (159 lb)   06/26/20 70.2 kg (154 lb 12.8 oz)     Gen: - awake, alert, in NAD  Derm: - well-healing surgical scars s/p BOLT, right sided chest tube sites healing very well, left sided chest tube sites healing well but remain scarred over  Vascular: - good pulses throughout,   CV: - RRR, no m/r/g  Pulm: - Normal symmetric excursion, Normal WOB on RA. Good air movement bilaterally, without wheezes or crackles including with forced expiraiton. Normal upper airway sounds without evidence of stridor  Abd: - soft, NT, ND, +BS, no hepatosplenomegally  Ext: - no edema of LE, no clubbing  Skin: mild redness over anterior chest, no warmth or TTP   Neuro: - Alert, oriented, follows commands. Moving all extremities spontaneously    LABS: (reviewed in Epic, pertinent values noted below)     Lab Results   Component Value Date    WBC 4.3 08/20/2020    HGB 12.7 (L) 08/20/2020    HCT 37.2 (L) 08/20/2020    PLT 145 (L) 08/20/2020       Lab Results   Component Value Date    NA 139 08/20/2020    K 4.4 08/20/2020    CL 104 08/20/2020    CO2 28.0 08/20/2020    BUN 47 (H) 08/20/2020    CREATININE 1.58 (H) 08/20/2020    GLU 79 08/20/2020    CALCIUM 9.4 08/20/2020    MG 1.7 08/20/2020    PHOS 2.2 (L) 08/20/2020       Lab Results   Component Value Date    BILITOT 0.3 08/20/2020    BILIDIR 0.10 05/28/2020    PROT 6.2 08/20/2020    ALBUMIN 3.8 08/20/2020    ALT 135 (H) 08/20/2020    AST 43 (H) 08/20/2020    ALKPHOS 97 08/20/2020    GGT 19 05/15/2020       Lab  Results   Component Value Date    PT 13.4 05/17/2020    INR 1.15 05/17/2020    APTT 23.9 (L) 05/15/2020         PFTs:  (personally reveiewed and interpreted):  Date: FVC (% Pred) FEV1 (% Pred) FEF25-75(% Pred) DLCO TBBx/Results           08/20/20 3.65 (98%) 3.15 (111%) 4.69 (211%)      08/01/20 3.64 (98.2%) 3.10 (109.3%) 4.41 (198.8%)     07/18/20 3.49 (94%) 2.98 (105%) 4.20 (189%)     07/04/20 3.43 (92.5%) 2.97 (104.7%) 4.26 (191.7%)     06/27/2020 3.13 84.5 2.70 95.4 3.51 157.8     06/20/20 3.39 (91.4%) 2.82 (99.2%) 3.34 (150.35)     06/13/20 3.10 (72.8%) 2.66 (82.5%) 3.34 (136.2%)      05/15/20      BOLT    03/11/20  2.75 (72%)  2.36 (81%)       02/12/20  2.85 (75%)  2.44 (86%)                IMAGING: (Personally reviewed, our interpretation is below)  CXR 08/20/20: stable blunting of R costophrenic angle with elevated R hemidiaphragm. NO acute changes     CXR 07/18/2020:  Small right pleural effusion vs mild elevation of the right hemidiaphragm, stable since early 06/2020    ASSESSMENT and PLAN   70 y.o. 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, also with type 2 diabetes mellitus, hypertension, colon cancer in 2007 s/p partial colon and hepatic wedge resection and FOLFOX here for follow up    Graft Function and Immunosuppression:  - PFTs continue to increase, CXR today w/ persistent R hemidiaphragm elevation, otherwise stable (previously queried possible effusion and none seen on ultrasound).   (was due for biopsy on 06/15/20, but held off for age and good PFTs at that time)  - held off on 3 mos bronch in setting of age, excellent PFTs.  Will send cell free DNA.  - s/p IVIg on 01/08 and 01/18, total IgG 451 08/20/2020, needs infusion but insurance won't approve   - HLA status: No DSAs, 06/13/20 HLA testing with DPB1:1 but report comments on DP1 bead known false reactivity) - previous provider discussed with HLA lab that this is definitely false positive.  Repeat negative 07/18/2020 with MFIs all 0, repeat today pending   - s/p basiliximab induction  - Tacro dose: 0.5mg  MWF, Goal: 8-10 (lower for age)   - tac ramp up w/ frequent labs per pharmacy as azole off   - Imuran 75 mg daily; dropped from 100 mg 08/01/20 given good PFT's, age, renal fx   - Stopped MMF 06/13/20 (GI upset).    - Prednisone Dose: 20mg  daily, taper after tac ramp up after azole washout  - Azithro BOS ppx, aspirin 81 mg daily  - statin held 08/01/20, plan to hold until after azole washout    Antimicrobial Prophylaxis:  - CMV, high risk (D+/R-): valganciclovir 900mg  daily for Estimated Creatinine Clearance: 41.4 mL/min (A) (based on SCr of 1.58 mg/dL (H)). Negative 06/20/20  - EBV, (D+/R+): negative 06/20/20   - EBV and CMV level pending today   - Fungal: Posaconazole 300mg  daily, last level 992 (06/27/20).  Stop today 08/20/20  - PCP: Bactrim SS MWF   - tamiflu during flu season, stop today 08/2020    Pain control:  - Moderately well controlled  - Continue gabapentin 100mg  TID  - Continue scheduled tylenol  Loose stools  - Improved with switch from MMF to imuran, but persistent   - Encouraged him to avoid sugar free foods as this exacerbates symptoms.  - No associated urgency or stomach cramping/pain or N/V   - Likely medication related.  - check GIPP and c.diff     Possible OSA:   - Noted to have desaturations to 88% overnight. 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  - pH probe with DeMeester 16.7%, slightly in abnormal range but will hold off on surgical intervention at this time in the setting of normal graft function.  - uptick in nocturnal GERD sx with taper so resume BID PPI 08/2020    BPH  - Increased terazosin to 2mg  nightly to help symptoms and BP, and symptoms have resolved    Blood Pressure Management:  - Home BPs stable  - Not currently on therapy    Kidney Function:  - Cr baseline 0.8-1.13  - Cr 1.37 08/01/20, slowly rising since early 06/2020  - Lasix on hold, no increase in LE edema    Iron Deficiency Anemia:   - received IV iron 05/22/20, ongoing fatigue, ctm  ??  Antiarrythmic Prophylaxis:   - finished amiodarone without arrhythmia.     Type 2 diabetes mellitus   - metformin + insulin, continue lantus 20, mealtime to 9/9/4   - Estimated Creatinine Clearance: 41.4 mL/min (A) (based on SCr of 1.58 mg/dL (H)).  - Blood sugars remain better with dietary changes    - may need insulin adjustment as pred tapers     Bone Health: Last Dexa: Oct 2021, no osteopenia or osteoporosis  - on Ca and Vit D supplementation currently, last Vit D 27 on 05/24/20    Health Maintenance:  - Last derm visit:  Pre-txp  - Last Colonsocopy (if >40): 12/2016 Diverticulosis and internal hemorrhoids  - Last flu shot:   02/08/2020  - Last Pneumovax:  06/10/2004, 12/30/2017  - Last Prevnar:  03/25/2016  - TdaP:   02/11/2018  - Shingrix:    03/10/20, Due #2 (planned for administration)  - COVID-19 (Moderna) 06/11/19, 07/09/19, 03/10/20, 07/18/2020, received 1/2 dose Evushield 06/20/20, and additional dose 08/01/20  - Last Dexa:   02/2020  - Last vitamin D:  51.2 (07/18/2020)  - Last HbA1c:   6.0 (06/13/2020)    Immunization History   Administered Date(s) Administered   ??? COVID-19 VACCINE,MRNA(MODERNA)(PF)(IM) 06/11/2019, 07/09/2019, 03/10/2020, 07/18/2020   ??? HEPB-CPG,ADULT DOSAGE ADJUVANTED, IM 03/12/2020   ??? INFLUENZA INJ MDCK PF, QUAD,(FLUCELVAX)(56MO AND UP EGG FREE) 02/08/2020   ??? INFLUENZA TIV (TRI) PF (IM) 02/27/2010   ??? Influenza Vaccine Quad (IIV4 PF) 58mo+ injectable 01/19/2019   ??? Influenza Virus Vaccine, unspecified formulation 02/07/2013, 02/28/2015, 02/05/2016, 02/02/2017, 01/27/2018   ??? PNEUMOCOCCAL POLYSACCHARIDE 23 06/10/2004, 12/30/2017   ??? Pneumococcal Conjugate 13-Valent 03/25/2016   ??? SHINGRIX-ZOSTER VACCINE (HZV), RECOMBINANT,SUB-UNIT,ADJUVANTED IM 03/10/2020   ??? TdaP 02/15/2012, 02/11/2018       The patient was assessed and discussed with Dr. Glenard Haring who is in agreement with plan.

## 2020-08-20 NOTE — Unmapped (Signed)
Called [name] to discuss lab results. Tacrolimus level was increased at 11.5 with a goal of 7-9. Medication regimen is currently 0.5 mg AM and 0.5 mg MWF. Discussed with [name], CPP, dosing at this time increased to 0.5 mg BID as stopped posa. Repeat labs scheduled Monday. Resulted labs reviewed. All questions answered. Pt verbalized understanding.

## 2020-08-21 LAB — CMV DNA, QUANTITATIVE, PCR: CMV VIRAL LD: NOT DETECTED

## 2020-08-25 ENCOUNTER — Encounter: Admit: 2020-08-25 | Discharge: 2020-08-26 | Payer: MEDICARE

## 2020-08-25 DIAGNOSIS — Z942 Lung transplant status: Principal | ICD-10-CM

## 2020-08-25 LAB — COMPREHENSIVE METABOLIC PANEL
ALBUMIN: 3.5 g/dL (ref 3.4–5.0)
ALKALINE PHOSPHATASE: 98 U/L (ref 46–116)
ALT (SGPT): 100 U/L — ABNORMAL HIGH (ref 10–49)
ANION GAP: 7 mmol/L (ref 5–14)
AST (SGOT): 40 U/L — ABNORMAL HIGH (ref <=34)
BILIRUBIN TOTAL: 0.4 mg/dL (ref 0.3–1.2)
BLOOD UREA NITROGEN: 52 mg/dL — ABNORMAL HIGH (ref 9–23)
BUN / CREAT RATIO: 39
CALCIUM: 9.2 mg/dL (ref 8.7–10.4)
CHLORIDE: 105 mmol/L (ref 98–107)
CO2: 30.5 mmol/L (ref 20.0–31.0)
CREATININE: 1.34 mg/dL — ABNORMAL HIGH
EGFR CKD-EPI AA MALE: 62 mL/min/{1.73_m2} (ref >=60)
EGFR CKD-EPI NON-AA MALE: 53 mL/min/{1.73_m2} — ABNORMAL LOW (ref >=60)
GLUCOSE RANDOM: 111 mg/dL (ref 70–179)
POTASSIUM: 4.5 mmol/L (ref 3.5–5.1)
PROTEIN TOTAL: 6.1 g/dL (ref 5.7–8.2)
SODIUM: 142 mmol/L (ref 135–145)

## 2020-08-25 LAB — TACROLIMUS LEVEL: TACROLIMUS BLOOD: 11.1 ng/mL

## 2020-08-25 NOTE — Unmapped (Signed)
Called Spencer Mallet Sr. to discuss lab results. Tacrolimus level was increased at 11.1 with a goal of 7-9. Medication regimen is currently 1/0.5. Discussed with [name], CPP, dosing at this time to remain the same as recently stopped posa. Repeat labs scheduled Thursday. Resulted labs reviewed. All questions answered. Pt verbalized understanding.

## 2020-08-28 ENCOUNTER — Encounter: Admit: 2020-08-28 | Discharge: 2020-08-29 | Payer: MEDICARE

## 2020-08-28 DIAGNOSIS — Z942 Lung transplant status: Principal | ICD-10-CM

## 2020-08-28 LAB — HLA DS POST TRANSPLANT
ANTI-DONOR DRW #1 MFI: 0 MFI
ANTI-DONOR DRW #2 MFI: 0 MFI
ANTI-DONOR HLA-A #1 MFI: 0 MFI
ANTI-DONOR HLA-A #2 MFI: 0 MFI
ANTI-DONOR HLA-B #1 MFI: 0 MFI
ANTI-DONOR HLA-B #2 MFI: 0 MFI
ANTI-DONOR HLA-C #1 MFI: 0 MFI
ANTI-DONOR HLA-C #2 MFI: 0 MFI
ANTI-DONOR HLA-DP AG #1 MFI: 0 MFI
ANTI-DONOR HLA-DQB #1 MFI: 0 MFI
ANTI-DONOR HLA-DQB #2 MFI: 0 MFI
ANTI-DONOR HLA-DR #1 MFI: 0 MFI
ANTI-DONOR HLA-DR #2 MFI: 0 MFI

## 2020-08-28 LAB — FSAB CLASS 2 ANTIBODY SPECIFICITY: HLA CL2 AB RESULT: NEGATIVE

## 2020-08-28 LAB — COMPREHENSIVE METABOLIC PANEL
ALBUMIN: 3.7 g/dL (ref 3.4–5.0)
ALKALINE PHOSPHATASE: 87 U/L (ref 46–116)
ALT (SGPT): 79 U/L — ABNORMAL HIGH (ref 10–49)
ANION GAP: 5 mmol/L (ref 5–14)
AST (SGOT): 32 U/L (ref <=34)
BILIRUBIN TOTAL: 0.5 mg/dL (ref 0.3–1.2)
BLOOD UREA NITROGEN: 48 mg/dL — ABNORMAL HIGH (ref 9–23)
BUN / CREAT RATIO: 34
CALCIUM: 9.3 mg/dL (ref 8.7–10.4)
CHLORIDE: 104 mmol/L (ref 98–107)
CO2: 30.2 mmol/L (ref 20.0–31.0)
CREATININE: 1.42 mg/dL — ABNORMAL HIGH
EGFR CKD-EPI AA MALE: 57 mL/min/{1.73_m2} — ABNORMAL LOW (ref >=60)
EGFR CKD-EPI NON-AA MALE: 50 mL/min/{1.73_m2} — ABNORMAL LOW (ref >=60)
GLUCOSE RANDOM: 77 mg/dL (ref 70–99)
POTASSIUM: 3.9 mmol/L (ref 3.5–5.1)
PROTEIN TOTAL: 6.1 g/dL (ref 5.7–8.2)
SODIUM: 139 mmol/L (ref 135–145)

## 2020-08-28 LAB — FSAB CLASS 1 ANTIBODY SPECIFICITY: HLA CLASS 1 ANTIBODY RESULT: NEGATIVE

## 2020-08-28 LAB — TACROLIMUS LEVEL: TACROLIMUS BLOOD: 12.2 ng/mL

## 2020-08-28 MED ORDER — TACROLIMUS 0.5 MG CAPSULE, IMMEDIATE-RELEASE
ORAL_CAPSULE | Freq: Every evening | ORAL | 3 refills | 90.00000 days | Status: CP
Start: 2020-08-28 — End: 2020-11-26

## 2020-08-28 MED ORDER — TACROLIMUS 1 MG CAPSULE, IMMEDIATE-RELEASE
ORAL_CAPSULE | Freq: Every evening | ORAL | 3 refills | 90.00000 days | Status: CP
Start: 2020-08-28 — End: 2020-08-28

## 2020-08-28 NOTE — Unmapped (Signed)
Addended by: Sharee Pimple on: 08/28/2020 04:16 PM     Modules accepted: Orders

## 2020-08-28 NOTE — Unmapped (Addendum)
Called Spencer Mallet Sr. to discuss lab results. Tacrolimus level was increased at 12.2 with a goal of 7-9. Medication regimen is currently 1mg  BID per wife (she states they were instructed to ramp up). Dosing at this time decreased to  1/0.5. Repeat labs scheduled next week with clinic. Resulted labs reviewed. All questions answered. Pt wife verbalized understanding.

## 2020-08-29 NOTE — Unmapped (Signed)
The [facility] has received the prescription(s) for Tacrolimus 0.5mg  and 1mg  capsules. The triage team has completed the benefits investigation and has determined that the patient is NOT able to fill this medication at the [facility] due to insurance plan limitations. Please see additional information below and re-route the prescription to the preferred pharmacy. Thank you.    PA Required: No    Specialty Pharmacy Required:  [facility] - Phone: [phone] and Fax: [phone]

## 2020-09-01 DIAGNOSIS — Z942 Lung transplant status: Principal | ICD-10-CM

## 2020-09-01 MED ORDER — TACROLIMUS 1 MG CAPSULE, IMMEDIATE-RELEASE
ORAL_CAPSULE | Freq: Every day | ORAL | 3 refills | 90.00000 days | Status: CP
Start: 2020-09-01 — End: 2020-11-30

## 2020-09-04 NOTE — Unmapped (Signed)
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 no complaints.     Assessment and plan:     # DM without DR.   Hbg A1c: 6.0 (06/13/2020), 6.5 (05/15/2020)    The patient was advised to maintain tight glucose control, tight blood pressure control, and favorable levels of cholesterol.    fu one year, OCT     #  h/o hemorrhagic PVD-   no RD/RD . No new floaters or photopsia   precautions reviewed    # pseudophakia OU.  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  I reviewed the resident???s note.  I agree with the resident???s findings and plan including interpretation of images.

## 2020-09-04 NOTE — Unmapped (Unsigned)
[facility] CLINIC PHARMACY NOTE  09/04/2020   Spencer LUNA Sr.  [id number]    Medication changes today:   1. Stop posaconazole  2. Stop tamiflu  3. Increase tac to 0.5mg  BID x2 days, then 1mg  AM/0.5mg  PM x 3 days, labs Monday/Thursday at [facility]  4. Shingrix #2 today  5. Increase pantoprazole to 40mg  BID    Education/Adherence tools provided today:  1. provided updated medication list  2. provided additional pill box education    Follow up items:  1. Posa washout  2. Drop pred to 15mg  daily at next visit once posa washout complete - adjust insulin    Next visit with pharmacy in 1-2 weeks  ____________________________________________________________________      Spencer Mallet Sr. is a 69 y.o. 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     Other PMH significant for hypertension, T2DM, colon cancer s/p hemicolectomy and R hepatectomy for metastasis + chemotherapy (2007), BPH    Seen by pharmacy today for: pill box assessment and adherence education and medication management    CC:  Patient has no complaints today.     There were no vitals filed for this visit.    Allergies   Allergen Reactions   ??? Cetuximab Anaphylaxis     Chest pain/rapid heart rate/ BP dropped   ??? Nsaids (Non-Steroidal Anti-Inflammatory Drug)      Interaction with transplant medications   ??? Venom-Honey Bee Anaphylaxis   ??? Enalapril      Angioedema     ??? Pollen Extracts Other (See Comments)     Nasal stuffiness, coughing, sneezing     Medications reviewed in EPIC medication station and updated today by the clinical pharmacist practitioner. Medication list includes revisions made during today???s encounter.    Outpatient Encounter Medications as of 09/05/2020   Medication Sig Dispense Refill   ??? acetaminophen (TYLENOL EXTRA STRENGTH) 500 MG tablet Take 1-2 tablets (500-1,000 mg total) by mouth every six (6) hours as needed for pain. 180 tablet 0   ??? aspirin (ECOTRIN) 81 MG tablet Take 1 tablet (81 mg total) by mouth daily. 90 tablet 3   ??? azaTHIOprine (IMURAN) 50 mg tablet Take 1 and 1/2 tablets (75 mg total) by mouth daily. 135 tablet 3   ??? azithromycin (ZITHROMAX) 250 MG tablet Take 1 tablet (250 mg total) by mouth daily. 30 tablet 11   ??? blood sugar diagnostic (ON CALL EXPRESS TEST STRIP) Strp Test daily before all meals/snacks and once before bedtime. 100 each 11   ??? calcium carbonate (CALCIUM 600) 1,500 mg (600 mg elem calcium) tablet Take 1 tablet (600 mg of elem calcium total) by mouth Two (2) times a day. 60 tablet 11   ??? cholecalciferol, vitamin D3 25 mcg, 1,000 units,, 1,000 unit (25 mcg) tablet Take 2 tablets (50 mcg total) by mouth daily. 100 tablet 11   ??? insulin ASPART (NOVOLOG FLEXPEN U-100 INSULIN) 100 unit/mL (3 mL) injection pen Inject 9 units with breakfast (12 units with high sugar breakfast), 9 units with afternoon meal, and 4 units with evening snack plus sliding scale 2 units for every 50 >150; max 50 units/day 15 mL 2   ??? insulin glargine (BASAGLAR, LANTUS) 100 unit/mL (3 mL) injection pen Inject 0.2 mL (20 Units total) under the skin nightly. 15 mL 3   ??? lancets 30 gauge Misc Test daily before all meals/snacks and once before bedtime. 100 each 11   ??? magnesium oxide (MAG-OX)  400 mg (241.3 mg elemental magnesium) tablet Take 2 tablets (800 mg total) by mouth Two (2) times a day. 120 tablet 11   ??? metFORMIN (GLUCOPHAGE) 1000 MG tablet Take 1 tablet by mouth twice daily 180 tablet 0   ??? pantoprazole (PROTONIX) 40 MG tablet Take 1 tablet (40 mg total) by mouth Two (2) times a day. 180 tablet 3   ??? pen needle, diabetic (BD ULTRA-FINE NANO PEN NEEDLE) 32 gauge x 5/32 (4 mm) Ndle Inject 1 each under the skin five (5) times a day. 150 each 11   ??? pravastatin (PRAVACHOL) 40 MG tablet HOLD 30 tablet 11   ??? predniSONE (DELTASONE) 5 MG tablet Take 4 tablets (20 mg total) by mouth daily. 120 tablet 11   ??? sulfamethoxazole-trimethoprim (BACTRIM) 400-80 mg per tablet Take 1 tablet (80 mg of trimethoprim total) by mouth Every Monday, Wednesday, and Friday. 36 tablet 3   ??? tacrolimus (PROGRAF) 0.5 MG capsule Take 1 capsule (0.5 mg total) by mouth nightly. 90 capsule 3   ??? tacrolimus (PROGRAF) 1 MG capsule Take 1 capsule (1 mg total) by mouth daily. 90 capsule 3   ??? terazosin (HYTRIN) 1 MG capsule Take 2 capsules (2 mg total) by mouth nightly. 60 capsule 11   ??? valGANciclovir (VALCYTE) 450 mg tablet Take 1 tablet (450 mg total) by mouth daily. 30 tablet 11     No facility-administered encounter medications on file as of 09/05/2020.       Immunosuppression:  Induction agent: basiliximab    Current immunosuppression:  ??? tacrolimus 1 mg AM/0.5mg  PM  o Prograf goal: 8-10  ??? Azathioprine 75 mg daily - changed from MMF 2/2 diarrhea while inpatient; dose decreased from 100mg  to 75mg  on 08/01/20 given downtrending ANC and IgG  ??? prednisone 20 mg daily    Patient is tolerating immunosuppression well.     IMMUNOSUPPRESSION DRUG LEVELS:  Lab Results   Component Value Date    Tacrolimus, Trough 11.5 08/20/2020    Tacrolimus, Trough 10.8 08/08/2020    Tacrolimus, Trough 13.1 08/01/2020    Tacrolimus, Timed 12.2 08/28/2020    Tacrolimus, Timed 11.1 08/25/2020     Tacrolimus level is accurate 12 hour trough    Graft function assessment via PFTs: improving   DSA: NTD - last chck 07/18/20  Biopsies to date: none  WBC/ANC:  wnl    Plan: Will increase tacrolimus given posa washout,  0.5mg  BID x2 days, then 1mg  AM/0.5mg  PM x 3 days, labs Monday/Thursday at [facility]    OI prophylaxis:   CMV Status: D+/ R-, high risk. CMV prophylaxis with valganciclovir 450 mg daily x 12 months per protocol.  Estimated Creatinine Clearance: 46.1 mL/min (A) (based on SCr of 1.42 mg/dL (H)).  PCP: Prophylaxis with bactrim SS 1 tab MWF indefinitely.  Fungal: Completed posaconazole 300 mg daily for prophylaxis x 3 months per protocol - COMPLETE (08/20/20)  ?? Last trough: 1,748 (07/18/20)  ?? Goal: > 700 ng/ml   Flu (seasonal): oseltamivir (Tamiflu) 75 mg every other day - COMPLETED (end of flu season)    Patient is tolerating infectious prophylaxis well.     Plan: Continue per protocol.     Infectious History:  Patient does not have a history of pathogenic infectious complications following lung transplant. 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 Previous antiinfective history use includes meropenem x7 days for broad spectrum gram negative and aspiration PNA coverage post-op per ICID.     Plan: Continue to monitor.  BOS prophylaxis:  Current meds include: azithromycin 250 mg daily and pantoprazole 40 mg daily  Pt endorses reflux/heartburn in AM since stopping PM dose. Esophageal motility study on 07/23/20 showed abnormal distal esophagus exposure during recumbent posture and overall borderline normal. Endoscopy on 2/17/22showed normal esophagus with erosive gastropathy with no stigmata.. Normal findings with biopsy results pending. Demeester 16    Plan: increase PPI back to BID, discuss long term plan given bone health risk with BID PPI vs. Nissen***    CV Prophylaxis:   Aspirin: asa 81 mg daily  The 10-year ASCVD risk score Denman George DC Jr., et al., 2013) is: 28.8%  Statin: pravastatin; currently on 40 mg daily.    Plan: Continue aspirin, hold statin given rising LFTs***    BP/Edema: Goal < 140/90. Encounter vitals reported above.  Home BP ranges: 110s-120s/70s***  Weights: stable  Current meds include: terazosin 2 mg at bedtime for BPH    Plan: BP within goal per home readings.     Anemia:  H/H:   Lab Results   Component Value Date    HGB 12.7 (L) 08/20/2020     Lab Results   Component Value Date    HCT 37.2 (L) 08/20/2020     Iron panel:  Lab Results   Component Value Date    IRON 52 (L) 08/01/2020    TIBC 304 08/01/2020    FERRITIN 607.8 (H) 08/01/2020     Lab Results   Component Value Date    Iron Saturation (%) 17 08/01/2020     Prior anemia therapy: IV Ferrlecit (1/10-1/13)    Plan: H/H improved since discharge, repeat iron panel today WNL. Continue to monitor.     DM:   Lab Results   Component Value Date    A1C 6.0 (H) 06/13/2020   Goal A1c < 7%.  Current meds include:  ?? Insulin Glargine 20 units daily in AM  ?? Novolog SSI 2:50>150  ?? Novolog units 9/9/4  ?? Metformin 1000 mg PO BID  ?? HOLDING home glipizide    Home BS log: Fast[place] consistently <100 (no lows), lunch values much improved with dietary changes to breakfast foods    Exercise: walking daily  Hypoglycemia: no  Plan: much improved glycemic control with dietary changes and current insuli regimen; will plan to decrease insulin empirically when pred decreased    Electrolytes: wnl  Current meds include: MgOx 800 mg BID    Plan: Continue as above.     GI/BM: no issues   Current meds include: none; previously on ondansetron, Miralax, and senna    Plan: Continue to monitor.     Pain: no pain   Current meds include: APAP 1000 mg TID PRN    Plan: continue    Bone health:  Patient currently on long-term steroid therapy.  Vitamin D Level: 51.7 (3/22). Goal > 30.   Last DEXA results (02/2020 pre-transplant): normal bone density  Current meds include: vitamin D3 2000 units daily, calcium carbonate 600 mg BID    Plan: continue     Women's/Men's Health:  ABRHAM MASLOWSKI Sr. is a 70 y.o. male. Patient reports history of BPH. Reports that BPH symptoms are controlled.  Current meds include: terazosin 2 mg PO nightly    Plan: Continue terazosin to 2mg  qHS . Continue to monitor.    Adherence:   Patient has average understanding of medications.  Patient does not fill their own pill box on a regular basis at home. Wife currently fills.  Patient brought medication card: yes  Pill box: was correct  Patient requested refills for the following meds: None  Corrections needed in Epic medication list: none     Plan: Provided moderate adherence counseling/intervention    I spent a total of 20 minutes face to face with the patient delivering clinical care and providing education/counseling.    Patient was reviewed with Dr.Coakley who was agreement with the stated plan.    During this visit, the following was completed:   BG log data assessment  BP log data assessment  Labs ordered and evaluated  complex treatment plan >1 DS   Patient education was completed for 25-60 minutes     All questions/concerns were addressed to the patient's satisfaction.  __________________________________________  PATIENT SEEN AND EVALUATED BY:    Vita Barley, PharmD  PGY-2 Cardiology Pharmacy Resident    CHRISTINA TEETER DOLIGALSKI, PHARMD, BCPS, CPP  [employer] TRANSPLANT PHARMACIST PRACTITIONER  PAGER [phone]

## 2020-09-05 ENCOUNTER — Ambulatory Visit: Admit: 2020-09-05 | Discharge: 2020-09-05 | Payer: MEDICARE

## 2020-09-05 ENCOUNTER — Encounter: Admit: 2020-09-05 | Discharge: 2020-09-05 | Payer: MEDICARE

## 2020-09-05 ENCOUNTER — Encounter: Admit: 2020-09-05 | Discharge: 2020-09-05 | Payer: MEDICARE | Attending: Internal Medicine | Primary: Internal Medicine

## 2020-09-05 DIAGNOSIS — Z942 Lung transplant status: Principal | ICD-10-CM

## 2020-09-05 DIAGNOSIS — K219 Gastro-esophageal reflux disease without esophagitis: Principal | ICD-10-CM

## 2020-09-05 DIAGNOSIS — Z0100 Encounter for examination of eyes and vision without abnormal findings: Principal | ICD-10-CM

## 2020-09-05 DIAGNOSIS — Z48298 Encounter for aftercare following other organ transplant: Principal | ICD-10-CM

## 2020-09-05 DIAGNOSIS — E119 Type 2 diabetes mellitus without complications: Principal | ICD-10-CM

## 2020-09-05 LAB — CBC W/ AUTO DIFF
BASOPHILS ABSOLUTE COUNT: 0 10*9/L (ref 0.0–0.1)
BASOPHILS RELATIVE PERCENT: 0.7 %
EOSINOPHILS ABSOLUTE COUNT: 0 10*9/L (ref 0.0–0.5)
EOSINOPHILS RELATIVE PERCENT: 1.1 %
HEMATOCRIT: 38.8 % — ABNORMAL LOW (ref 39.0–48.0)
HEMOGLOBIN: 13 g/dL (ref 12.9–16.5)
LYMPHOCYTES ABSOLUTE COUNT: 0.7 10*9/L — ABNORMAL LOW (ref 1.1–3.6)
LYMPHOCYTES RELATIVE PERCENT: 20 %
MEAN CORPUSCULAR HEMOGLOBIN CONC: 33.6 g/dL (ref 32.0–36.0)
MEAN CORPUSCULAR HEMOGLOBIN: 31.2 pg (ref 25.9–32.4)
MEAN CORPUSCULAR VOLUME: 92.7 fL (ref 77.6–95.7)
MEAN PLATELET VOLUME: 6.8 fL (ref 6.8–10.7)
MONOCYTES ABSOLUTE COUNT: 0.2 10*9/L — ABNORMAL LOW (ref 0.3–0.8)
MONOCYTES RELATIVE PERCENT: 6 %
NEUTROPHILS ABSOLUTE COUNT: 2.7 10*9/L (ref 1.8–7.8)
NEUTROPHILS RELATIVE PERCENT: 72.2 %
PLATELET COUNT: 161 10*9/L (ref 150–450)
RED BLOOD CELL COUNT: 4.18 10*12/L — ABNORMAL LOW (ref 4.26–5.60)
RED CELL DISTRIBUTION WIDTH: 20.3 % — ABNORMAL HIGH (ref 12.2–15.2)
WBC ADJUSTED: 3.7 10*9/L (ref 3.6–11.2)

## 2020-09-05 LAB — COMPREHENSIVE METABOLIC PANEL
ALBUMIN: 4.1 g/dL (ref 3.4–5.0)
ALKALINE PHOSPHATASE: 78 U/L (ref 46–116)
ALT (SGPT): 60 U/L — ABNORMAL HIGH (ref 10–49)
ANION GAP: 6 mmol/L (ref 5–14)
AST (SGOT): 30 U/L (ref <=34)
BILIRUBIN TOTAL: 0.5 mg/dL (ref 0.3–1.2)
BLOOD UREA NITROGEN: 37 mg/dL — ABNORMAL HIGH (ref 9–23)
BUN / CREAT RATIO: 30
CALCIUM: 9.8 mg/dL (ref 8.7–10.4)
CHLORIDE: 103 mmol/L (ref 98–107)
CO2: 30 mmol/L (ref 20.0–31.0)
CREATININE: 1.24 mg/dL — ABNORMAL HIGH
EGFR CKD-EPI AA MALE: 68 mL/min/{1.73_m2} (ref >=60)
EGFR CKD-EPI NON-AA MALE: 59 mL/min/{1.73_m2} — ABNORMAL LOW (ref >=60)
GLUCOSE RANDOM: 76 mg/dL (ref 70–99)
POTASSIUM: 3.8 mmol/L (ref 3.4–4.8)
PROTEIN TOTAL: 6.3 g/dL (ref 5.7–8.2)
SODIUM: 139 mmol/L (ref 135–145)

## 2020-09-05 LAB — MAGNESIUM: MAGNESIUM: 1.5 mg/dL — ABNORMAL LOW (ref 1.6–2.6)

## 2020-09-05 LAB — TACROLIMUS LEVEL, TROUGH: TACROLIMUS, TROUGH: 6 ng/mL (ref 5.0–15.0)

## 2020-09-05 LAB — PHOSPHORUS: PHOSPHORUS: 2.8 mg/dL (ref 2.4–5.1)

## 2020-09-05 LAB — IGG: GAMMAGLOBULIN; IGG: 461 mg/dL — ABNORMAL LOW (ref 646–2013)

## 2020-09-05 MED ORDER — INSULIN GLARGINE (U-100) 100 UNIT/ML (3 ML) SUBCUTANEOUS PEN
Freq: Every evening | SUBCUTANEOUS | 3 refills | 88 days | Status: CP
Start: 2020-09-05 — End: 2021-09-05
  Filled 2020-09-30: qty 15, 88d supply, fill #0

## 2020-09-05 MED ORDER — TERAZOSIN 1 MG CAPSULE
ORAL_CAPSULE | Freq: Every evening | ORAL | 11 refills | 30.00000 days | Status: CP
Start: 2020-09-05 — End: ?
  Filled 2020-09-11: qty 90, 30d supply, fill #0

## 2020-09-05 MED ORDER — TACROLIMUS 1 MG CAPSULE, IMMEDIATE-RELEASE
ORAL_CAPSULE | Freq: Two times a day (BID) | ORAL | 3 refills | 90 days | Status: CP
Start: 2020-09-05 — End: 2020-12-04

## 2020-09-05 MED ORDER — PREDNISONE 5 MG TABLET
ORAL_TABLET | Freq: Every day | ORAL | 11 refills | 30 days | Status: CP
Start: 2020-09-05 — End: ?
  Filled 2020-09-15: qty 90, 30d supply, fill #0

## 2020-09-05 MED ORDER — PRAVASTATIN 40 MG TABLET
ORAL_TABLET | Freq: Every day | ORAL | 11 refills | 30 days
Start: 2020-09-05 — End: ?

## 2020-09-05 MED ORDER — ACETAMINOPHEN 500 MG TABLET
ORAL_TABLET | Freq: Four times a day (QID) | ORAL | prn refills | 23 days | Status: CP | PRN
Start: 2020-09-05 — End: ?
  Filled 2020-09-17: qty 180, 23d supply, fill #0

## 2020-09-05 NOTE — Unmapped (Signed)
In with Dr. Dudley Major to see Spencer Mallet Sr. Doing well, PFTs up today.  Decreasing prednisone to 15 mg daily today. Discussed other medication changes.  Cleared to drive.  Return to clinic one month. Questions answered, verbalized understanding.

## 2020-09-05 NOTE — Unmapped (Signed)
See Pharmacy encounter in clinic this day for additional documentation.

## 2020-09-05 NOTE — Unmapped (Signed)
LUNG TRANSPLANT CLASS PARTICIPATION NOTE    Spencer Pena and wife Spencer Pena attended today's lung transplant class via teleconference. Tommie Raymond, MD, talked about healthy living after transplant.     [name], LCSW, CCTSW  Transplant Case Manager  September 05, 2020 2:47 PM

## 2020-09-05 NOTE — Unmapped (Signed)
Pulmonary Transplant Clinic    HISTORY:   HPI:  70 y.o. 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     --> basiliximab induction    --> CMV high risk (D+/R-)    --> EBV moderate risk (D+/R+)    --> Not Increased Risk Donor  - Colon Cancer 2007    --> s/p resection of liver mets    --> s/p resection of 6 inches of colon    --> FOLFOX Chemo  - type 2 diabetes mellitus     --> not insulin dependent  - OA    SOCIAL HISTORY:   - 20pk years, quit in 1991  - drinks about 10 drinks a week but will stop  - Works maintenance at [employer]  - Married and lives in [place]    FAMILY HISTORY:   - Parents lived into late 90's    MEDS: (personally reviewed in EPIC, pertinent meds noted below)         OBJECTIVE DATA:   PHYSICAL EXAM:  BP 135/77  - Pulse 101  - Temp 36.6 ??C (97.8 ??F) (Tympanic)  - Ht 171.5 cm (5' 7.52)  - Wt 69.9 kg (154 lb)  - SpO2 98%  - BMI 23.75 kg/m??    Gen: - awake, alert, in NAD  Derm: - well-healing surgical scars s/p BOLT, right sided chest tube sites healing very well, left sided chest tube sites healing well but remain scarred over  Vascular: - good pulses throughout,   CV: - RRR, no m/r/g  Pulm: - Normal symmetric excursion, Normal WOB on RA. Good air movement bilaterally, without wheezes or crackles including with forced expiraiton. Normal upper airway sounds without evidence of stridor  Abd: - soft, NT, ND, +BS, no hepatosplenomegally  Ext: - no edema of LE, no clubbing  Skin: mild redness over anterior chest, no warmth or TTP   Neuro: - Alert, oriented, follows commands. Moving all extremities spontaneously    LABS: (reviewed in Epic, pertinent values noted below)     PFTs:  (personally reveiewed and interpreted):  Date: FVC (% Pred) FEV1 (% Pred) FEF25-75(% Pred) DLCO TBBx/Results                                                                                    09/05/20  3.70  3.14      08/20/20 3.65 (98%) 3.15 (111%) 4.69 (211%)      08/01/20 3.64 (98.2%) 3.10 (109.3%) 4.41 (198.8%)     07/18/20 3.49 (94%) 2.98 (105%) 4.20 (189%)     07/04/20 3.43 (92.5%) 2.97 (104.7%) 4.26 (191.7%)     06/27/2020 3.13 84.5 2.70 95.4 3.51 157.8     06/20/20 3.39 (91.4%) 2.82 (99.2%) 3.34 (150.35)     06/13/20 3.10 (72.8%) 2.66 (82.5%) 3.34 (136.2%)      05/15/20      BOLT  03/11/20  2.75 (72%)  2.36 (81%)       02/12/20  2.85 (75%)  2.44 (86%)                IMAGING: (Personally reviewed, our interpretation is below)  CXR 08/20/20: stable blunting of R costophrenic angle with elevated R hemidiaphragm. NO acute changes     CXR 07/18/2020:  Small right pleural effusion vs mild elevation of the right hemidiaphragm, stable since early 06/2020    ASSESSMENT and PLAN   70 y.o. 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, also with type 2 diabetes mellitus, hypertension, colon cancer in 2007 s/p partial colon and hepatic wedge resection and FOLFOX here for follow up    Graft Function and Immunosuppression:  - PFTs continue to increase, CXR stable, clinically well  - HLA status: No DSAs, checked monthly  - Total IgG <500 but insurance will not approve IVIG  - FK goal 8-10, current dose 1mg /0.5mg ... consider envarsus  - Imuran 75 mg daily; dropped from 100 mg 08/01/20 given good PFT's, age, renal fx and total IgG    --> Stopped MMF 06/13/20 (GI upset).    - Prednisone 20mg  daily... drop to 15mg  QD  - Azithro BOS ppx, aspirin 81 mg daily  - Restart prava 40mg  QD    Antimicrobial Prophylaxis:  - CMV, high risk (D+/R-): valganciclovir 900mg  daily for Estimated Creatinine Clearance: 52.8 mL/min (A) (based on SCr of 1.24 mg/dL (H)). Negative 06/20/20  - EBV, (D+/R+): negative 06/20/20  - Fungal: Posaconazole stopped at 32mo  - PCP: Bactrim SS MWF   - tamiflu during flu season, stopped    Pain control:  - Moderately well controlled  - Continue gabapentin 100mg  TID  - Continue scheduled tylenol    Loose stools  - Improved with switch from MMF to imuran, but persistent   - Encouraged him to avoid sugar free foods as this exacerbates symptoms.  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  - pH probe with DeMeester 16.7%, slightly in abnormal range but will hold off on surgical intervention at this time in the setting of normal graft function.    --> BID PPI, consdier dropping to every day at 6months    BPH  - Increased terazosin to 2mg  nightly to help symptoms and BP, and symptoms have resolved... increase to 4mg  as posa washing out     Blood Pressure Management:  - Home BPs stable  - Not currently on therapy    Kidney Function:  - Cr baseline 0.8-1.13  - Cr 1.37 08/01/20, slowly rising since early 06/2020  - Lasix on hold, no increase in LE edema    Iron Deficiency Anemia:   - received IV iron 05/22/20  ??  Antiarrythmic Prophylaxis:   - finished amiodarone without arrhythmia.     Type 2 diabetes mellitus   - 20U glargine am... drop to 17U as dropping prednisone dose   - asparte 9u before breakfast/supper, 4u with evening snack   - metformin 1000mg  BID  - sugars well controlled    Bone Health: Last Dexa: Oct 2021, no osteopenia or osteoporosis  - on Ca and Vit D supplementation currently, last  Vit D 27 on 05/24/20    Health Maintenance:  - Last derm visit:  Pre-txp  - Last Colonsocopy (if >40): 12/2016 Diverticulosis and internal hemorrhoids  - Last flu shot:   02/08/2020  - Last Pneumovax:  06/10/2004, 12/30/2017  - Last Prevnar:  03/25/2016  - TdaP:   02/11/2018  - Shingrix:    03/10/20, Due #2 (planned for administration)  - COVID-19 (Moderna) 06/11/19, 07/09/19, 03/10/20, 07/18/2020, received 1/2 dose Evushield 06/20/20, and additional dose 08/01/20  - Last Dexa:   02/2020  - Last vitamin D:  51.2 (07/18/2020)  - Last HbA1c:   6.0 (06/13/2020)    Immunization History   Administered Date(s) Administered   ??? COVID-19 VACCINE,MRNA(MODERNA)(PF)(IM) 06/11/2019, 07/09/2019, 03/10/2020, 07/18/2020   ??? HEPB-CPG,ADULT DOSAGE ADJUVANTED, IM 03/12/2020   ??? INFLUENZA INJ MDCK PF, QUAD,(FLUCELVAX)(67MO AND UP EGG FREE) 02/08/2020   ??? INFLUENZA TIV (TRI) PF (IM) 02/27/2010   ??? Influenza Vaccine Quad (IIV4 PF) 84mo+ injectable 01/19/2019   ??? Influenza Virus Vaccine, unspecified formulation 02/07/2013, 02/28/2015, 02/05/2016, 02/02/2017, 01/27/2018   ??? PNEUMOCOCCAL POLYSACCHARIDE 23 06/10/2004, 12/30/2017   ??? Pneumococcal Conjugate 13-Valent 03/25/2016   ??? SHINGRIX-ZOSTER VACCINE (HZV), RECOMBINANT,SUB-UNIT,ADJUVANTED IM 03/10/2020, 08/20/2020   ??? TdaP 02/15/2012, 02/11/2018       >31min was spent with the patient face to face and >31min was spent reviewing chart/imaging.    Complex medical decision making was done as we adjust medications based on blood work/drug levels and multiple complaints and problems were addresed

## 2020-09-05 NOTE — Unmapped (Signed)
.  follow up as instructed

## 2020-09-05 NOTE — Unmapped (Signed)
Called Spencer Pena Sr. to discuss lab results. Tacrolimus level was decreased at 6 with a goal of 7-9 . Medication regimen is currently 1/0.5. Discussed with [name], CPP, dosing at this time increased to 1 mg BID. Repeat labs scheduled one week. Resulted labs reviewed. All questions answered. Pt verbalized understanding.

## 2020-09-05 NOTE — Unmapped (Signed)
[facility] CLINIC PHARMACY NOTE  09/05/2020   Spencer KALMAR Sr.  [id number]    Medication changes today:   1. Decrease prednisone to 15 mg daily  2. Decrease insulin glargine to 17 units daily  3. Increase terazosin to 3 mg nightly  4. Start pravastatin 40 mg daily    Education/Adherence tools provided today:  1. provided updated medication list  2. provided additional pill box education    Follow up items:  1. Blood glucose levels on reduced steroid dose  2. LFTs on resumed statin    Next visit with pharmacy in 1 month  ____________________________________________________________________      Spencer Mallet Sr. is a 70 y.o. 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     Other PMH significant for hypertension, T2DM, colon cancer s/p hemicolectomy and R hepatectomy for metastasis + chemotherapy (2007), BPH    Seen by pharmacy today for: pill box assessment and adherence education and medication management    CC:  Patient has no complaints today.     Vitals:    09/05/20 0915   BP: 135/77   Pulse: 101   Temp: 36.6 ??C (97.9 ??F)   SpO2: 98%       Allergies   Allergen Reactions   ??? Cetuximab Anaphylaxis     Chest pain/rapid heart rate/ BP dropped   ??? Nsaids (Non-Steroidal Anti-Inflammatory Drug)      Interaction with transplant medications   ??? Venom-Honey Bee Anaphylaxis   ??? Enalapril      Angioedema     ??? Pollen Extracts Other (See Comments)     Nasal stuffiness, coughing, sneezing     Medications reviewed in EPIC medication station and updated today by the clinical pharmacist practitioner. Medication list includes revisions made during today???s encounter.    Outpatient Encounter Medications as of 09/05/2020   Medication Sig Dispense Refill   ??? acetaminophen (TYLENOL EXTRA STRENGTH) 500 MG tablet Take 1-2 tablets (500-1,000 mg total) by mouth every six (6) hours as needed for pain. 180 tablet 0   ??? aspirin (ECOTRIN) 81 MG tablet Take 1 tablet (81 mg total) by mouth daily. 90 tablet 3 ??? azaTHIOprine (IMURAN) 50 mg tablet Take 1 and 1/2 tablets (75 mg total) by mouth daily. 135 tablet 3   ??? azithromycin (ZITHROMAX) 250 MG tablet Take 1 tablet (250 mg total) by mouth daily. 30 tablet 11   ??? blood sugar diagnostic (ON CALL EXPRESS TEST STRIP) Strp Test daily before all meals/snacks and once before bedtime. 100 each 11   ??? calcium carbonate (CALCIUM 600) 1,500 mg (600 mg elem calcium) tablet Take 1 tablet (600 mg of elem calcium total) by mouth Two (2) times a day. 60 tablet 11   ??? cholecalciferol, vitamin D3 25 mcg, 1,000 units,, 1,000 unit (25 mcg) tablet Take 2 tablets (50 mcg total) by mouth daily. 100 tablet 11   ??? insulin ASPART (NOVOLOG FLEXPEN U-100 INSULIN) 100 unit/mL (3 mL) injection pen Inject 9 units with breakfast (12 units with high sugar breakfast), 9 units with afternoon meal, and 4 units with evening snack plus sliding scale 2 units for every 50 >150; max 50 units/day 15 mL 2   ??? insulin glargine (BASAGLAR, LANTUS) 100 unit/mL (3 mL) injection pen Inject 0.2 mL (20 Units total) under the skin nightly. 15 mL 3   ??? lancets 30 gauge Misc Test daily before all meals/snacks and once before bedtime. 100 each 11   ???  magnesium oxide (MAG-OX) 400 mg (241.3 mg elemental magnesium) tablet Take 2 tablets (800 mg total) by mouth Two (2) times a day. 120 tablet 11   ??? metFORMIN (GLUCOPHAGE) 1000 MG tablet Take 1 tablet by mouth twice daily 180 tablet 0   ??? pantoprazole (PROTONIX) 40 MG tablet Take 1 tablet (40 mg total) by mouth Two (2) times a day. 180 tablet 3   ??? pen needle, diabetic (BD ULTRA-FINE NANO PEN NEEDLE) 32 gauge x 5/32 (4 mm) Ndle Inject 1 each under the skin five (5) times a day. 150 each 11   ??? pravastatin (PRAVACHOL) 40 MG tablet HOLD 30 tablet 11   ??? predniSONE (DELTASONE) 5 MG tablet Take 4 tablets (20 mg total) by mouth daily. 120 tablet 11   ??? sulfamethoxazole-trimethoprim (BACTRIM) 400-80 mg per tablet Take 1 tablet (80 mg of trimethoprim total) by mouth Every Monday, Wednesday, and Friday. 36 tablet 3   ??? tacrolimus (PROGRAF) 0.5 MG capsule Take 1 capsule (0.5 mg total) by mouth nightly. 90 capsule 3   ??? tacrolimus (PROGRAF) 1 MG capsule Take 1 capsule (1 mg total) by mouth daily. 90 capsule 3   ??? terazosin (HYTRIN) 1 MG capsule Take 2 capsules (2 mg total) by mouth nightly. 60 capsule 11   ??? valGANciclovir (VALCYTE) 450 mg tablet Take 1 tablet (450 mg total) by mouth daily. 30 tablet 11     No facility-administered encounter medications on file as of 09/05/2020.       Immunosuppression:  Induction agent: basiliximab    Current immunosuppression:  ??? tacrolimus 1 mg AM/0.5mg  PM  o Prograf goal: 8-10  ??? Azathioprine 75 mg daily - changed from MMF 2/2 diarrhea while inpatient; dose decreased from 100mg  to 75mg  on 08/01/20 given downtrending ANC and IgG  ??? prednisone 20 mg daily    Patient is tolerating immunosuppression well.     IMMUNOSUPPRESSION DRUG LEVELS:  Lab Results   Component Value Date    Tacrolimus, Trough 11.5 08/20/2020    Tacrolimus, Trough 10.8 08/08/2020    Tacrolimus, Trough 13.1 08/01/2020    Tacrolimus, Timed 12.2 08/28/2020    Tacrolimus, Timed 11.1 08/25/2020     Tacrolimus level is accurate 12 hour trough    Graft function assessment via PFTs: improving, stable  DSA: NTD - last chck 07/18/20  Biopsies to date: none  WBC/ANC:  wnl    Plan: Will decrease prednisone to 15 mg daily    OI prophylaxis:   CMV Status: D+/ R-, high risk. CMV prophylaxis with valganciclovir 450 mg daily x 12 months per protocol.  Estimated Creatinine Clearance: 46.1 mL/min (A) (based on SCr of 1.42 mg/dL (H)).  PCP: Prophylaxis with bactrim SS 1 tab MWF indefinitely.  Fungal: Completed posaconazole 300 mg daily for prophylaxis x 3 months per protocol - COMPLETE (08/20/20)  ?? Last trough: 1,748 (07/18/20)  ?? Goal: > 700 ng/ml   Flu (seasonal): oseltamivir (Tamiflu) 75 mg every other day - COMPLETED (end of flu season)    Patient is tolerating infectious prophylaxis well.     Plan: Continue per protocol.     Infectious History:  Patient does not have a history of pathogenic infectious complications following lung transplant. 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 Previous antiinfective history use includes meropenem x7 days for broad spectrum gram negative and aspiration PNA coverage post-op per ICID.     Plan: Continue to monitor.    BOS prophylaxis:  Current meds include: azithromycin 250 mg daily and  pantoprazole 40 mg BID  Pt endorses reflux/heartburn in AM since stopping PM dose. Esophageal motility study on 07/23/20 showed abnormal distal esophagus exposure during recumbent posture and overall borderline normal. Endoscopy on 2/17/22showed normal esophagus with erosive gastropathy with no stigmata.. Normal findings with biopsy results pending. Demeester 16    Plan: Continue current regimen. Re-evaluate need for BID PPI at future visits. [name] did report improvement in GERD symptoms when going from every day to BID dosing.    CV Prophylaxis:   Aspirin: asa 81 mg daily  The 10-year ASCVD risk score Denman George DC Jr., et al., 2013) is: 28.8%  Statin: pravastatin; currently on 40 mg daily.    Plan: Continue aspirin. Resume pravastatin 40 mg daily. Monitor LFTs.    BP/Edema: Goal < 140/90. Encounter vitals reported above.  Home BP ranges: 116-135/67-77 mmHg  Weights: stable  Current meds include: terazosin 2 mg at bedtime for BPH    Plan: BP within goal per home readings. Increase terazosin to 3 mg nightly given increased BPH symptoms    Anemia:  H/H:   Lab Results   Component Value Date    HGB 13.0 09/05/2020     Lab Results   Component Value Date    HCT 38.8 (L) 09/05/2020     Iron panel:  Lab Results   Component Value Date    IRON 52 (L) 08/01/2020    TIBC 304 08/01/2020    FERRITIN 607.8 (H) 08/01/2020     Lab Results   Component Value Date    Iron Saturation (%) 17 08/01/2020     Prior anemia therapy: IV Ferrlecit (1/10-1/13)    Plan: H/H improved since discharge, repeat iron panel today WNL. Continue to monitor.     DM:   Lab Results   Component Value Date    A1C 6.0 (H) 06/13/2020   Goal A1c < 7%.  Current meds include:  ?? Insulin Glargine 20 units daily in AM  ?? Novolog SSI 2:50>150  ?? Novolog units 9/9/4  ?? Metformin 1000 mg PO BID  ?? HOLDING home glipizide (took pre-transplant)    Home BS log: Fasting: 98-109, Pre-3pm meal: 131-153, Pre-evening snack: 82-262. No hypoglycemia. Rule of 15 reviewed.    Exercise: walking daily  Hypoglycemia: no  Plan: Decrease insulin glargine to 17 units nightly given reduction in prednisone dose    Electrolytes: wnl  Current meds include: MgOx 800 mg BID    Plan: Continue as above.     GI/BM: no issues   Current meds include: none; previously on ondansetron, Miralax, and senna    Plan: Continue to monitor.     Pain: no pain   Current meds include: APAP 1000 mg TID PRN    Plan: continue    Bone health:  Patient currently on long-term steroid therapy.  Vitamin D Level: 51.7 (3/22). Goal > 30.   Last DEXA results (02/2020 pre-transplant): normal bone density  Current meds include: vitamin D3 2000 units daily, calcium carbonate 600 mg BID    Plan: continue     Women's/Men's Health:  Spencer DREW Sr. is a 70 y.o. male. Patient reports history of BPH. Reports that BPH symptoms are controlled.  Current meds include: terazosin 2 mg PO nightly    Plan: Increase terazosin to 3 mg qHS . Continue to monitor.    Adherence:   Patient has average understanding of medications.  Patient does not fill their own pill box on a regular basis at home. Wife  currently fills. Encouraged patient to double-check sporadic days of pill box to increase engagement in medication management.  Patient brought medication card: yes  Pill box: was correct  Patient requested refills for the following meds: None  Corrections needed in Epic medication list: none     Plan: Provided moderate adherence counseling/intervention    I spent a total of 20 minutes face to face with the patient delivering clinical care and providing education/counseling.    Patient was reviewed with Dr. Dudley Major who was agreement with the stated plan.    During this visit, the following was completed:   BG log data assessment  BP log data assessment  Labs ordered and evaluated  complex treatment plan >1 DS   Patient education was completed for 25-60 minutes     All questions/concerns were addressed to the patient's satisfaction.  __________________________________________  PATIENT SEEN AND EVALUATED BY:    Vita Barley, PharmD  PGY-2 Cardiology Pharmacy Resident    Spencer Pena, PHARMD, BCPS, CPP  [employer]ID [employer] TRANSPLANT PHARMACIST PRACTITIONER  PAGER [phone]

## 2020-09-07 LAB — EBV QUANTITATIVE PCR, BLOOD: EBV VIRAL LOAD RESULT: NOT DETECTED

## 2020-09-08 LAB — CMV DNA, QUANTITATIVE, PCR: CMV VIRAL LD: NOT DETECTED

## 2020-09-10 MED ORDER — PRAVASTATIN 40 MG TABLET
ORAL_TABLET | Freq: Every day | ORAL | 11 refills | 30.00000 days | Status: CP
Start: 2020-09-10 — End: ?
  Filled 2020-09-15: qty 30, 30d supply, fill #0

## 2020-09-10 NOTE — Unmapped (Signed)
[facility] Refill Coordination Note    Specialty Medication(s) to be Shipped:   Transplant: Prednisone 5mg     Other medication(s) to be shipped: TERAZOSIN, PRAVASTATIN,SULFMETH-TRIMETH,PEN NEEDLES, LANCETS, TEST STRIPS     Spencer Pena., DOB: [birth year]  Phone: [phone] (home) [phone] (work)      All above HIPAA information was verified with patient's family member, WIFE.     Was a Nurse, learning disability used for this call? No    Completed refill call assessment today to schedule patient's medication shipment from the [facility] [phone]).  All relevant notes have been reviewed.     Specialty medication(s) and dose(s) confirmed: Regimen is correct and unchanged.   Changes to medications: Fawaz reports no changes at this time.  Changes to insurance: No  New side effects reported not previously addressed with a pharmacist or physician: None reported  Questions for the pharmacist: No    Confirmed patient received a Conservation officer, historic buildings and a Surveyor, mining with first shipment. The patient will receive a drug information handout for each medication shipped and additional FDA Medication Guides as required.       DISEASE/MEDICATION-SPECIFIC INFORMATION        N/A    SPECIALTY MEDICATION ADHERENCE     Medication Adherence    Patient reported X missed doses in the last month: 0  Specialty Medication: Prednisone 5mg   Patient is on additional specialty medications: Yes  Additional Specialty Medications: Valganciclovir 450mg   Patient Reported Additional Medication X Missed Doses in the Last Month: 0  Patient is on more than two specialty medications: No  Informant: spouse  Reliability of informant: reliable  Confirmed plan for next specialty medication refill: delivery by pharmacy  Refills needed for supportive medications: yes, ordered or provider notified          Refill Coordination    Has the Patients' Contact Information Changed: No  Is the Shipping Address Different: No         Were doses missed due to medication being on hold? No    PREDNISONE 5 mg: 7 days of medicine on hand       REFERRAL TO PHARMACIST     Referral to the pharmacist: Not needed      [facility]     Shipping address confirmed in Epic.     Delivery Scheduled: Yes, Expected medication delivery date: 5/10.  However, Rx request for refills was sent to the provider as there are none remaining.     Medication will be delivered via Next Day Courier to the prescription address in Epic WAM.    Jolene Schimke   [facility] Specialty Technician

## 2020-09-11 ENCOUNTER — Encounter: Admit: 2020-09-11 | Discharge: 2020-09-12 | Payer: MEDICARE

## 2020-09-11 DIAGNOSIS — Z942 Lung transplant status: Principal | ICD-10-CM

## 2020-09-11 LAB — COMPREHENSIVE METABOLIC PANEL
ALBUMIN: 3.1 g/dL — ABNORMAL LOW (ref 3.4–5.0)
ALKALINE PHOSPHATASE: 59 U/L (ref 46–116)
ALT (SGPT): 27 U/L (ref 10–49)
ANION GAP: 6 mmol/L (ref 5–14)
AST (SGOT): 18 U/L (ref <=34)
BILIRUBIN TOTAL: 0.4 mg/dL (ref 0.3–1.2)
BLOOD UREA NITROGEN: 36 mg/dL — ABNORMAL HIGH (ref 9–23)
BUN / CREAT RATIO: 34
CALCIUM: 8.9 mg/dL (ref 8.7–10.4)
CHLORIDE: 106 mmol/L (ref 98–107)
CO2: 28.7 mmol/L (ref 20.0–31.0)
CREATININE: 1.06 mg/dL
EGFR CKD-EPI AA MALE: 82 mL/min/{1.73_m2} (ref >=60)
EGFR CKD-EPI NON-AA MALE: 71 mL/min/{1.73_m2} (ref >=60)
GLUCOSE RANDOM: 81 mg/dL (ref 70–179)
POTASSIUM: 3.7 mmol/L (ref 3.5–5.1)
PROTEIN TOTAL: 5.7 g/dL (ref 5.7–8.2)
SODIUM: 141 mmol/L (ref 135–145)

## 2020-09-11 LAB — TACROLIMUS LEVEL: TACROLIMUS BLOOD: 4.9 ng/mL

## 2020-09-11 MED ORDER — TACROLIMUS 1 MG CAPSULE, IMMEDIATE-RELEASE
ORAL_CAPSULE | ORAL | 3 refills | 90 days | Status: CP
Start: 2020-09-11 — End: 2020-12-10

## 2020-09-11 MED ORDER — TACROLIMUS 0.5 MG CAPSULE, IMMEDIATE-RELEASE
ORAL_CAPSULE | Freq: Every evening | ORAL | 3 refills | 90.00000 days | Status: CP
Start: 2020-09-11 — End: 2021-09-11

## 2020-09-11 NOTE — Unmapped (Signed)
Called Spencer Mallet Sr. (spoke with [name]) to discuss lab results. Tacrolimus level was decreased at 4.9 with a goal of 7-9. Medication regimen is currently 1 mg BID. Discussed with [name], CPP, dosing at this time increased to 2/1.5. Repeat labs scheduled one week. Resulted labs reviewed. All questions answered. Pt verbalized understanding.

## 2020-09-15 MED FILL — SULFAMETHOXAZOLE 400 MG-TRIMETHOPRIM 80 MG TABLET: ORAL | 84 days supply | Qty: 36 | Fill #1

## 2020-09-15 MED FILL — BD ULTRA-FINE NANO PEN NEEDLE 32 GAUGE X 5/32" (4 MM): SUBCUTANEOUS | 40 days supply | Qty: 200 | Fill #1

## 2020-09-15 MED FILL — CALCIUM CARBONATE 600 MG CALCIUM (1,500 MG) TABLET: ORAL | 30 days supply | Qty: 60 | Fill #3

## 2020-09-15 MED FILL — ON CALL EXPRESS TEST STRIP: 20 days supply | Qty: 100 | Fill #2

## 2020-09-15 MED FILL — ON CALL LANCET 30 GAUGE: 20 days supply | Qty: 100 | Fill #2

## 2020-09-16 NOTE — Unmapped (Signed)
Received calls today from [facility] about various food items such as chili from [facility] and hot dogs from [employer]. Discussed the high sodium content with him and that these should be a rare item due to this. Discussed they are food safe as long as no raw onions are added from the restaurant. Patient indicated understanding.

## 2020-09-16 NOTE — Unmapped (Signed)
[facility] Refill Coordination Note    Specialty Medication(s) to be Shipped:   Transplant: valgancyclovir 450mg     Other medication(s) to be shipped: azithromycin and magnesium     AMIL BOUWMAN Sr., DOB: [birth year]  Phone: [phone] (home) [phone] (work)      All above HIPAA information was verified with patient.     Was a Nurse, learning disability used for this call? No    Completed refill call assessment today to schedule patient's medication shipment from the [facility] [phone]).  All relevant notes have been reviewed.     Specialty medication(s) and dose(s) confirmed: Regimen is correct and unchanged.   Changes to medications: Chanze reports no changes at this time.  Changes to insurance: No  New side effects reported not previously addressed with a pharmacist or physician: None reported  Questions for the pharmacist: No    Confirmed patient received a Conservation officer, historic buildings and a Surveyor, mining with first shipment. The patient will receive a drug information handout for each medication shipped and additional FDA Medication Guides as required.       DISEASE/MEDICATION-SPECIFIC INFORMATION        N/A    SPECIALTY MEDICATION ADHERENCE     Medication Adherence    Patient reported X missed doses in the last month: 0  Specialty Medication: Valganciclovir 450mg   Patient is on additional specialty medications: No        Were doses missed due to medication being on hold? No    Valganciclovir 450 mg: 3 days of medicine on hand     REFERRAL TO PHARMACIST     Referral to the pharmacist: Not needed      [facility]     Shipping address confirmed in Epic.     Delivery Scheduled: Yes, Expected medication delivery date: 09/18/2020.     Medication will be delivered via Next Day Courier to the prescription address in Epic WAM.    [name]   [facility] Specialty Technician

## 2020-09-17 MED FILL — VALGANCICLOVIR 450 MG TABLET: ORAL | 30 days supply | Qty: 30 | Fill #2

## 2020-09-17 MED FILL — MAGNESIUM OXIDE 400 MG (241.3 MG MAGNESIUM) TABLET: ORAL | 30 days supply | Qty: 120 | Fill #3

## 2020-09-17 MED FILL — AZITHROMYCIN 250 MG TABLET: ORAL | 30 days supply | Qty: 30 | Fill #3

## 2020-09-18 NOTE — Unmapped (Signed)
LUNG TRANSPLANT CLASS PARTICIPATION NOTE    Spencer Pena and wife Spencer Pena attended today's lung transplant class via [employer]. Baldo Ash, MD, talked about rejection.     [name], LCSW, CCTSW  Transplant Case Manager  Sep 18, 2020 3:42 PM

## 2020-09-19 ENCOUNTER — Encounter: Admit: 2020-09-19 | Discharge: 2020-09-20 | Payer: MEDICARE

## 2020-09-19 LAB — COMPREHENSIVE METABOLIC PANEL
ALBUMIN: 3.4 g/dL (ref 3.4–5.0)
ALKALINE PHOSPHATASE: 68 U/L (ref 46–116)
ALT (SGPT): 31 U/L (ref 10–49)
ANION GAP: 3 mmol/L — ABNORMAL LOW (ref 5–14)
AST (SGOT): 19 U/L (ref <=34)
BILIRUBIN TOTAL: 0.4 mg/dL (ref 0.3–1.2)
BLOOD UREA NITROGEN: 36 mg/dL — ABNORMAL HIGH (ref 9–23)
BUN / CREAT RATIO: 31
CALCIUM: 9.4 mg/dL (ref 8.7–10.4)
CHLORIDE: 107 mmol/L (ref 98–107)
CO2: 31.7 mmol/L — ABNORMAL HIGH (ref 20.0–31.0)
CREATININE: 1.18 mg/dL — ABNORMAL HIGH
EGFR CKD-EPI AA MALE: 72 mL/min/{1.73_m2} (ref >=60)
EGFR CKD-EPI NON-AA MALE: 62 mL/min/{1.73_m2} (ref >=60)
GLUCOSE RANDOM: 92 mg/dL (ref 70–179)
POTASSIUM: 4.2 mmol/L (ref 3.5–5.1)
PROTEIN TOTAL: 5.8 g/dL (ref 5.7–8.2)
SODIUM: 142 mmol/L (ref 135–145)

## 2020-09-19 LAB — TACROLIMUS LEVEL: TACROLIMUS BLOOD: 8.7 ng/mL

## 2020-09-19 NOTE — Unmapped (Signed)
Called Spencer Mallet Sr. to discuss lab results, spoke to [place]. Tacrolimus level was normal at 8.7 with a goal of 7-9. Medication regimen is currently 2/1.5 mg. Dosing at this time to remain the same. Repeat labs scheduled two weeks with clinic. Resulted labs reviewed. All questions answered. Pt verbalized understanding.

## 2020-09-25 DIAGNOSIS — Z942 Lung transplant status: Principal | ICD-10-CM

## 2020-09-30 MED FILL — NOVOLOG FLEXPEN U-100 INSULIN ASPART 100 UNIT/ML (3 ML) SUBCUTANEOUS: 30 days supply | Qty: 15 | Fill #1

## 2020-10-02 NOTE — Unmapped (Signed)
LUNG TRANSPLANT CLASS PARTICIPATION NOTE    Jowan and caregiver attended today's lung transplant class via [employer] video conference. A panel of experts talked about fundraising for out-of-pocket transplant-related expenses.    [name], LCSW, CCTSW  Transplant Case Manager  Oct 02, 2020 3:15 PM

## 2020-10-03 ENCOUNTER — Encounter: Admit: 2020-10-03 | Discharge: 2020-10-04 | Payer: PRIVATE HEALTH INSURANCE

## 2020-10-03 ENCOUNTER — Encounter
Admit: 2020-10-03 | Discharge: 2020-10-04 | Payer: PRIVATE HEALTH INSURANCE | Attending: Internal Medicine | Primary: Internal Medicine

## 2020-10-03 DIAGNOSIS — R0683 Snoring: Principal | ICD-10-CM

## 2020-10-03 DIAGNOSIS — Z942 Lung transplant status: Principal | ICD-10-CM

## 2020-10-03 LAB — CBC W/ AUTO DIFF
BASOPHILS ABSOLUTE COUNT: 0 10*9/L (ref 0.0–0.1)
BASOPHILS RELATIVE PERCENT: 0.6 %
EOSINOPHILS ABSOLUTE COUNT: 0 10*9/L (ref 0.0–0.5)
EOSINOPHILS RELATIVE PERCENT: 0.8 %
HEMATOCRIT: 37.2 % — ABNORMAL LOW (ref 39.0–48.0)
HEMOGLOBIN: 12.5 g/dL — ABNORMAL LOW (ref 12.9–16.5)
LYMPHOCYTES ABSOLUTE COUNT: 0.8 10*9/L — ABNORMAL LOW (ref 1.1–3.6)
LYMPHOCYTES RELATIVE PERCENT: 20.1 %
MEAN CORPUSCULAR HEMOGLOBIN CONC: 33.5 g/dL (ref 32.0–36.0)
MEAN CORPUSCULAR HEMOGLOBIN: 31.7 pg (ref 25.9–32.4)
MEAN CORPUSCULAR VOLUME: 94.4 fL (ref 77.6–95.7)
MEAN PLATELET VOLUME: 7.3 fL (ref 6.8–10.7)
MONOCYTES ABSOLUTE COUNT: 0.2 10*9/L — ABNORMAL LOW (ref 0.3–0.8)
MONOCYTES RELATIVE PERCENT: 6.1 %
NEUTROPHILS ABSOLUTE COUNT: 2.8 10*9/L (ref 1.8–7.8)
NEUTROPHILS RELATIVE PERCENT: 72.4 %
PLATELET COUNT: 142 10*9/L — ABNORMAL LOW (ref 150–450)
RED BLOOD CELL COUNT: 3.94 10*12/L — ABNORMAL LOW (ref 4.26–5.60)
RED CELL DISTRIBUTION WIDTH: 17.2 % — ABNORMAL HIGH (ref 12.2–15.2)
WBC ADJUSTED: 3.9 10*9/L (ref 3.6–11.2)

## 2020-10-03 LAB — COMPREHENSIVE METABOLIC PANEL
ALBUMIN: 3.7 g/dL (ref 3.4–5.0)
ALKALINE PHOSPHATASE: 89 U/L (ref 46–116)
ALT (SGPT): 102 U/L — ABNORMAL HIGH (ref 10–49)
ANION GAP: 2 mmol/L — ABNORMAL LOW (ref 5–14)
AST (SGOT): 95 U/L — ABNORMAL HIGH (ref <=34)
BILIRUBIN TOTAL: 0.3 mg/dL (ref 0.3–1.2)
BLOOD UREA NITROGEN: 39 mg/dL — ABNORMAL HIGH (ref 9–23)
BUN / CREAT RATIO: 28
CALCIUM: 9.7 mg/dL (ref 8.7–10.4)
CHLORIDE: 104 mmol/L (ref 98–107)
CO2: 32 mmol/L — ABNORMAL HIGH (ref 20.0–31.0)
CREATININE: 1.41 mg/dL — ABNORMAL HIGH
EGFR CKD-EPI (2021) MALE: 54 mL/min/{1.73_m2} — ABNORMAL LOW (ref >=60)
GLUCOSE RANDOM: 88 mg/dL (ref 70–99)
POTASSIUM: 4.4 mmol/L (ref 3.4–4.8)
PROTEIN TOTAL: 6.2 g/dL (ref 5.7–8.2)
SODIUM: 138 mmol/L (ref 135–145)

## 2020-10-03 LAB — PHOSPHORUS: PHOSPHORUS: 3.1 mg/dL (ref 2.4–5.1)

## 2020-10-03 LAB — TACROLIMUS LEVEL, TROUGH: TACROLIMUS, TROUGH: 16.7 ng/mL — ABNORMAL HIGH (ref 5.0–15.0)

## 2020-10-03 LAB — MAGNESIUM: MAGNESIUM: 1.4 mg/dL — ABNORMAL LOW (ref 1.6–2.6)

## 2020-10-03 LAB — IGG: GAMMAGLOBULIN; IGG: 452 mg/dL — ABNORMAL LOW (ref 646–2013)

## 2020-10-03 MED ORDER — TACROLIMUS 1 MG CAPSULE, IMMEDIATE-RELEASE
ORAL_CAPSULE | Freq: Two times a day (BID) | ORAL | 3 refills | 90 days | Status: CP
Start: 2020-10-03 — End: 2021-01-01

## 2020-10-03 MED ORDER — FLUTICASONE PROPIONATE 115 MCG-SALMETEROL 21 MCG/ACTUATION HFA INHALER
Freq: Two times a day (BID) | RESPIRATORY_TRACT | 11 refills | 30 days | Status: CP
Start: 2020-10-03 — End: 2021-10-03
  Filled 2020-10-14: qty 12, 30d supply, fill #0

## 2020-10-03 NOTE — Unmapped (Signed)
Called Spencer Mallet Sr. to discuss lab results. Tacrolimus level was increased at 16.7 with a goal of 7-9. Medication regimen is currently 1.5/2. Discussed with Dr. Baldo Ash, dosing at this time decreased to  1 mg BID after holding tonight's dose. Repeat labs scheduled Tuesday. Resulted labs reviewed. All questions answered. Pt verbalized understanding.

## 2020-10-03 NOTE — Unmapped (Signed)
Please see patient pharmacy visit for documentation.

## 2020-10-03 NOTE — Unmapped (Signed)
[facility] CLINIC PHARMACY NOTE  10/03/2020   Spencer STOCKHAM Sr.  [id number]    Medication changes today:   1. Hold pravastatin   2. Decrease azathioprine to 50mg    3. Increase magnesium to 1200mg  BID   4. Decrease lantus to 15 units daily     Education/Adherence tools provided today:  1. provided updated medication list    Follow up items:  1. LFTs   2. A1c in 3 months   3. Lipid panel in 6 months   4. Repeat iron studies with next labs   5. Montelukast for CLAD ppx in the future     Next visit with pharmacy in 1 month  ____________________________________________________________________    Spencer Mallet Sr. is a 70 y.o. 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     Other PMH significant for hypertension, T2DM, colon cancer s/p hemicolectomy and R hepatectomy for metastasis + chemotherapy (2007), BPH    Seen by pharmacy today for: medication management and  pill box assessment and adherence education. Last pharmacy visit 09/05/20.    CC:  Patient has no complaints today however he does report that tremors are still bothersome for him. Overall he feels that his body has not yet adjusted to the burden of medications but feels he is better tolerating them and getting stronger.     Vitals:    10/03/20 0922   BP: 134/68   Pulse: 103   Temp: 36.2 ??C (97.2 ??F)   SpO2: 97%       Allergies   Allergen Reactions   ??? Cetuximab Anaphylaxis     Chest pain/rapid heart rate/ BP dropped   ??? Nsaids (Non-Steroidal Anti-Inflammatory Drug)      Interaction with transplant medications   ??? Venom-Honey Bee Anaphylaxis   ??? Enalapril      Angioedema     ??? Pollen Extracts Other (See Comments)     Nasal stuffiness, coughing, sneezing     Medications reviewed in EPIC medication station and updated today by the clinical pharmacist practitioner. Medication list includes revisions made during today???s encounter.    Outpatient Encounter Medications as of 10/03/2020   Medication Sig Dispense Refill   ??? acetaminophen (TYLENOL EXTRA STRENGTH) 500 MG tablet Take 1-2 tablets (500-1,000 mg total) by mouth every six (6) hours as needed for pain. 180 tablet PRN   ??? aspirin (ECOTRIN) 81 MG tablet Take 1 tablet (81 mg total) by mouth daily. 90 tablet 3   ??? azaTHIOprine (IMURAN) 50 mg tablet Take 1 and 1/2 tablets (75 mg total) by mouth daily. 135 tablet 3   ??? azithromycin (ZITHROMAX) 250 MG tablet Take 1 tablet (250 mg total) by mouth daily. 30 tablet 11   ??? blood sugar diagnostic (ON CALL EXPRESS TEST STRIP) Strp Test daily before all meals/snacks and once before bedtime. 100 each 11   ??? calcium carbonate (CALCIUM 600) 1,500 mg (600 mg elem calcium) tablet Take 1 tablet (600 mg of elem calcium total) by mouth Two (2) times a day. 60 tablet 11   ??? cholecalciferol, vitamin D3 25 mcg, 1,000 units,, 1,000 unit (25 mcg) tablet Take 2 tablets (50 mcg total) by mouth daily. 100 tablet 11   ??? fluticasone propion-salmeteroL (ADVAIR HFA) 115-21 mcg/actuation inhaler Inhale 2 puffs Two (2) times a day. 12 g 11   ??? insulin ASPART (NOVOLOG FLEXPEN U-100 INSULIN) 100 unit/mL (3 mL) injection pen Inject 9 units with breakfast (12 units with high sugar  breakfast), 9 units with afternoon meal, and 4 units with evening snack plus sliding scale 2 units for every 50 >150; max 50 units/day 15 mL 2   ??? insulin glargine (BASAGLAR, LANTUS) 100 unit/mL (3 mL) injection pen Inject 0.17 mL (17 Units total) under the skin nightly. 15 mL 3   ??? lancets 30 gauge Misc Test daily before all meals/snacks and once before bedtime. 100 each 11   ??? magnesium oxide (MAG-OX) 400 mg (241.3 mg elemental magnesium) tablet Take 2 tablets (800 mg total) by mouth Two (2) times a day. 120 tablet 11   ??? metFORMIN (GLUCOPHAGE) 1000 MG tablet Take 1 tablet by mouth twice daily 180 tablet 0   ??? pantoprazole (PROTONIX) 40 MG tablet Take 1 tablet (40 mg total) by mouth Two (2) times a day. 180 tablet 3   ??? pen needle, diabetic (BD ULTRA-FINE NANO PEN NEEDLE) 32 gauge x 5/32 (4 mm) Ndle Use as directed for injections five (5) times a day. 150 each 11   ??? pravastatin (PRAVACHOL) 40 MG tablet Take 1 tablet (40 mg total) by mouth daily. 30 tablet 11   ??? predniSONE (DELTASONE) 5 MG tablet Take 3 tablets (15 mg total) by mouth daily. 90 tablet 11   ??? sulfamethoxazole-trimethoprim (BACTRIM) 400-80 mg per tablet Take 1 tablet (80 mg of trimethoprim total) by mouth Every Monday, Wednesday, and Friday. 36 tablet 3   ??? tacrolimus (PROGRAF) 0.5 MG capsule Take 1 capsule (0.5 mg total) by mouth nightly. Take with one 1 mg capsule in PM (2 mg AM /1.5 mg PM) 90 capsule 3   ??? tacrolimus (PROGRAF) 1 MG capsule Take 2 capsules (2 mg total) by mouth daily AND 1 capsule (1 mg total) nightly. Take PM dose with one 0.5 mg capsule (2mg  AM / 1.5 mg PM). 270 capsule 3   ??? terazosin (HYTRIN) 1 MG capsule Take 3 capsules (3 mg total) by mouth nightly. 90 capsule 11   ??? valGANciclovir (VALCYTE) 450 mg tablet Take 1 tablet (450 mg total) by mouth daily. 30 tablet 11     No facility-administered encounter medications on file as of 10/03/2020.       Immunosuppression:  Induction agent: basiliximab    Current immunosuppression:  ??? tacrolimus 2 mg AM/1.5mg  PM  o Prograf goal: 7-9  ??? Azathioprine 75 mg daily - changed from MMF 2/2 diarrhea while inpatient; dose decreased from 100mg  to 75mg  on 08/01/20 given downtrending ANC and IgG  ??? prednisone 15 mg daily - decreased from 20mg  on 09/05/20     Patient complains of tremor that is worse in the late morning and midafternoon. Diarrhea completely resolved ~2 weeks ago.     IMMUNOSUPPRESSION DRUG LEVELS:  Lab Results   Component Value Date    Tacrolimus, Trough 6.0 09/05/2020    Tacrolimus, Trough 11.5 08/20/2020    Tacrolimus, Trough 10.8 08/08/2020    Tacrolimus, Timed 8.7 09/19/2020    Tacrolimus, Timed 4.9 09/11/2020    Tacrolimus, Timed 12.2 08/28/2020     Tacrolimus level is accurate 12 hour trough. Last night's dose was taken at 8:30PM and today's dose was held until after labs.     Graft function assessment via PFTs: stable  DSA: NTD - 08/20/20   Biopsies to date: none  WBC/ANC:  wnl - ALC remains low     Plan: Pending tacrolimus level would change schedule to give higher dose at night to reduce midday peak blood levels. Decrease azathioprine to 50mg  daily given  continued low ALC and IgG.      OI prophylaxis:   CMV Status: D+/ R-, high risk. CMV prophylaxis with valganciclovir 450 mg daily x 12 months per protocol.  Estimated Creatinine Clearance: 45.6 mL/min (A) (based on SCr of 1.41 mg/dL (H)).  PCP: Prophylaxis with bactrim SS 1 tab MWF indefinitely.  Fungal: Completed posaconazole 300 mg daily for prophylaxis x 3 months per protocol - COMPLETE (08/20/20)  Flu (seasonal): oseltamivir (Tamiflu) 75 mg every other day - COMPLETED (end of flu season)    Patient is tolerating infectious prophylaxis well.     Plan: Continue per protocol.     Infectious History:  Patient does not have a history of pathogenic infectious complications following lung transplant. 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 Previous antiinfective history use includes meropenem x7 days for broad spectrum gram negative and aspiration PNA coverage post-op per ICID.     Plan: Continue to monitor.    BOS prophylaxis:  Current meds include: azithromycin 250 mg daily and pantoprazole 40 mg daily   Esophageal motility study on 07/23/20 showed abnormal distal esophagus exposure during recumbent posture and overall borderline normal. 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 Normal findings with biopsy results pending. Demeester 16. Patient denies any reflux symptoms with every day dosing.     Plan: Continue current regimen. Initiate advair HFA 2 puffs BID. Will defer montelukast for now to reduce pill burden.     CV Prophylaxis:   Aspirin: asa 81 mg daily  The 10-year ASCVD risk score Denman George DC Jr., et al., 2013) is: 29.1%  Statin: pravastatin; currently on 40 mg daily - previously held for elevated LFTs     Plan: Continue aspirin. Hold pravastatin.     BP/Edema: Goal <140/90. Patient denies lower extremity edema and reports that BPH symptoms have resolved with increased terazosin dose.   Encounter vitals reported above.  Home BP ranges: 115-131/62-75 mmHg  Weights: stable  Current meds include: terazosin 3mg  at bedtime for BPH    Plan: BP and BPH within goal. Continue.     Anemia:  H/H:   Lab Results   Component Value Date    HGB 12.5 (L) 10/03/2020     Lab Results   Component Value Date    HCT 37.2 (L) 10/03/2020     Iron panel:  Lab Results   Component Value Date    IRON 52 (L) 08/01/2020    TIBC 304 08/01/2020    FERRITIN 607.8 (H) 08/01/2020     Lab Results   Component Value Date    Iron Saturation (%) 17 08/01/2020     Prior anemia therapy: IV Ferrlecit (1/10-1/13)    Plan: H/H out of goal but stable. Consider repeating iron studies. Patient with documented deficiency in March however insurance will not cover IV iron until lower threshlold is met.      DM:   Lab Results   Component Value Date    A1C 6.0 (H) 06/13/2020   Goal A1c < 7%.  Current meds include:  ?? Insulin Glargine 17 units daily in AM  ?? Novolog SSI 2:50>150  ?? Novolog units 9/9/4  ?? Metformin 1000 mg PO BID  ?? HOLDING home glipizide (took pre-transplant)    Home BS log: Fasting: 86-122, Pre-3pm meal: 75-186 (isolated 75 due to eating light breakfast), Pre-evening snack: 122-233. No hypoglycemia. Rule of 15 reviewed.     Exercise: walking daily  Hypoglycemia: no  Plan: Decrease insulin  glargine to 15 units nightly given lower AM glucoses     Electrolytes: Mag is low  Current meds include: MgOx 800 mg BID    Plan: Increase to MgOx 1200mg  BID. Counseled patient regarding risk for diarrhea/GI upset.     GI/BM: no issues   Current meds include: none; previously on ondansetron, Miralax, and senna    Plan: Continue to monitor.     Pain: no pain   Current meds include: APAP 1000 mg TID PRN (using every 2-3 days)    Plan: continue    Bone health:  Patient currently on long-term steroid therapy.  Vitamin D Level: 51.7 (3/22). Goal > 30.   Last DEXA results (02/2020 pre-transplant): normal bone density  Current meds include: vitamin D3 2000 units daily, calcium carbonate 600 mg BID    Plan: continue     Women's/Men's Health:  Spencer SCHREIFELS Sr. is a 70 y.o. male. Patient reports history of BPH. Reports that BPH symptoms are controlled.  Current meds include: terazosin 3 mg PO nightly (increased 09/05/20 given increased BPH symptoms)     Plan: Continue current dose and BP monitoring.     Adherence:   Patient has average understanding of medications.  Patient does not fill their own pill box on a regular basis at home. Wife currently fills however patient watches her fill the box and she reports that he is learning his medications better.   Patient brought medication card: yes  Pill box: did not bring  Patient requested refills for the following meds: None  Corrections needed in Epic medication list: pantoprazole SIG to daily     Plan: Provided moderate adherence counseling/intervention    I spent a total of 20 minutes face to face with the patient delivering clinical care and providing education/counseling.    Patient was reviewed with Dr. Dudley Major who was agreement with the stated plan.    During this visit, the following was completed:   BG log data assessment  BP log data assessment  Labs ordered and evaluated  complex treatment plan >1 DS   Patient education was completed for 25-60 minutes     All questions/concerns were addressed to the patient's satisfaction.  __________________________________________  PATIENT SEEN AND EVALUATED BY:    Kayla B. Logan Bores, PharmD   PGY-2 Solid Organ Transplant Pharmacy Resident    Sufyan Meidinger TEETER Jaxtin Raimondo, PHARMD, BCPS, CPP  SOLID ORGAN TRANSPLANT PHARMACIST PRACTITIONER  PAGER [phone]

## 2020-10-03 NOTE — Unmapped (Addendum)
Pulmonary Transplant Clinic    HISTORY:   HPI:  70 y.o. 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 Only complaint is that he's on a lot of medications. Continues to have an intention tremor, classically occurring mid-morning/afternoon.   - Exercising more, able to modified push-ups  - No lightheadedness, no chest pain  - Does snore, some observed apneas, sleepy after he eats. Has never had a sleep study.       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     --> basiliximab induction    --> CMV high risk (D+/R-)    --> EBV moderate risk (D+/R+)    --> Not Increased Risk Donor  - Colon Cancer 2007    --> s/p resection of liver mets    --> s/p resection of 6 inches of colon    --> FOLFOX Chemo  - type 2 diabetes mellitus     --> not insulin dependent  - OA    SOCIAL HISTORY:   - 20pk years, quit in 1991  - drinks about 10 drinks a week but will stop  - Works maintenance at [employer]  - Married and lives in [place]    FAMILY HISTORY:   - Parents lived into late 90's    MEDS: (personally reviewed in EPIC, pertinent meds noted below)         OBJECTIVE DATA:   PHYSICAL EXAM:  BP 134/68  - Pulse 103  - Temp 36.2 ??C (Tympanic)  - Ht 170.2 cm (5' 7)  - Wt 72.1 kg (159 lb)  - SpO2 97%  - BMI 24.90 kg/m??    Gen: - awake, alert, in NAD  Derm: - well-healing surgical scars s/p BOLT, right sided chest tube sites healing very well, left sided chest tube sites healing well but remain scarred over  Vascular: - good pulses throughout,   CV: - RRR, no m/r/g  Pulm: - Normal symmetric excursion, Normal WOB on RA. Good air movement bilaterally, without wheezes or crackles including with forced expiraiton. Normal upper airway sounds without evidence of stridor  Abd: - soft, NT, ND, +BS, no hepatosplenomegally  Ext: - no edema of LE, no clubbing  Skin: mild redness over anterior chest, no warmth or TTP   Neuro: - Alert, oriented, follows commands. Moving all extremities spontaneously    LABS: (reviewed in Epic, pertinent values noted below)     PFTs:  (personally reveiewed and interpreted):    Date: FVC (% Pred) FEV1 (% Pred) FEF25-75(% Pred) DLCO TBBx/Results                                                                           10/03/2020 3.68 (99.4) 3.13 (110.6)  4.22 (190.6)      09/05/20  3.70  3.14      08/20/20 3.65 (98%) 3.15 (111%) 4.69 (211%)      08/01/20 3.64 (98.2%) 3.10 (109.3%) 4.41 (198.8%)     07/18/20 3.49 (94%) 2.98 (  105%) 4.20 (189%)     07/04/20 3.43 (92.5%) 2.97 (104.7%) 4.26 (191.7%)     06/27/2020 3.13 84.5 2.70 95.4 3.51 157.8     06/20/20 3.39 (91.4%) 2.82 (99.2%) 3.34 (150.35)     06/13/20 3.10 (72.8%) 2.66 (82.5%) 3.34 (136.2%)      05/15/20      BOLT    03/11/20  2.75 (72%)  2.36 (81%)       02/12/20  2.85 (75%)  2.44 (86%)                IMAGING: (Personally reviewed, our interpretation is below)  CXR 08/20/20: stable blunting of R costophrenic angle with elevated R hemidiaphragm. NO acute changes     CXR 07/18/2020:  Small right pleural effusion vs mild elevation of the right hemidiaphragm, stable since early 06/2020    ASSESSMENT and PLAN   70 y.o. 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 consider envarsus  - Imuran 75 mg daily; dropped from 100 mg 08/01/20 given good PFT's, age, renal fx and total IgG. Will drop to 50 mg daily given renal function. --> Stopped MMF 06/13/20 (GI upset).    - Prednisone 15mg  daily (decreased last visit)  - Azithro BOS ppx, aspirin 81 mg daily  - Restart prava 40mg  QD, has elevated LFTs again, will continue at 40 mg  - Start Advair today (not on Montelukast, but on Azithro)    Antimicrobial Prophylaxis:  - CMV, high risk (D+/R-): valganciclovir 450mg  daily for Estimated Creatinine Clearance: 45.6 mL/min (A) (based on SCr of 1.41 mg/dL (H)). Negative 06/20/20  - EBV, (D+/R+): negative 06/20/20  - Fungal: Posaconazole stopped at 17mo  - PCP: Bactrim SS MWF   - tamiflu during flu season, stopped     Pain control:  - Moderately well controlled  - Previously on gabapentin 100mg  TID, now off  - Tylenol TID prn    Concern for OSA: STOP-BANG 4 points (male, age, observed apnea, snoring)  - will order sleep study (has never had one)    Loose stools: resolved 2 to 3 weeks ago  - Improved with switch from MMF to imuran  - Encouraged him to avoid sugar free foods as this exacerbates symptoms.  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  - pH probe with DeMeester 16.7%, slightly in abnormal range but will hold off on surgical intervention at this time in the setting of normal graft function.    --> On daily PPI    BPH  - Increased terazosin to 2mg  nightly to help symptoms and BP, and symptoms have resolved... increase to 4mg  as posa washing out     Blood Pressure Management:  - Home BPs stable  - Not currently on therapy    Kidney Function:  - Cr baseline  0.8-1.13  - Cr continues rising  - Lasix on hold, no increase in LE edema    Iron Deficiency Anemia:   - received IV iron 05/22/20, continue to be low    Hypomagnesemia: Continue to be low  - Increase 800 mg BID to 1200 mg BID today  ??  Antiarrythmic Prophylaxis:   - finished amiodarone without arrhythmia.     Type 2 diabetes mellitus   - Lantus 17U qhs... 86-122 is morning glucose, will drop to 15 units in the morning  - aspart 9u before breakfast/supper, 4u with evening snack   - metformin 1000mg  BID  - sugars well controlled, apart from some lower morning sugars (96-045)    Bone Health: Last Dexa: Oct 2021, no osteopenia or osteoporosis  - on Ca and Vit D supplementation currently, last Vit D 27 on 05/24/20    Health Maintenance:  - Last derm visit:  Pre-txp  - Last Colonsocopy (if >40): 12/2016 Diverticulosis and internal hemorrhoids  - Last flu shot:   02/08/2020  - Last Pneumovax:  06/10/2004, 12/30/2017  - Last Prevnar:  03/25/2016  - TdaP:   02/11/2018  - Shingrix:    03/10/20, 08/20/20  - COVID-19 (Moderna) 06/11/19, 07/09/19, 03/10/20, 07/18/2020, received 1/2 dose Evushield 06/20/20, and additional dose 08/01/20  - Last Dexa:   02/2020  - Last vitamin D:  51.2 (07/18/2020)  - Last HbA1c:   6.0 (06/13/2020), will repeat in 12/2020    Immunization History   Administered Date(s) Administered   ??? COVID-19 VACCINE,MRNA(MODERNA)(PF)(IM) 06/11/2019, 07/09/2019, 03/10/2020, 07/18/2020   ??? HEPB-CPG,ADULT DOSAGE ADJUVANTED, IM 03/12/2020   ??? INFLUENZA INJ MDCK PF, QUAD,(FLUCELVAX)(47MO AND UP EGG FREE) 02/08/2020   ??? INFLUENZA TIV (TRI) PF (IM) 02/27/2010   ??? Influenza Vaccine Quad (IIV4 PF) 87mo+ injectable 01/19/2019   ??? Influenza Virus Vaccine, unspecified formulation 02/07/2013, 02/28/2015, 02/05/2016, 02/02/2017, 01/27/2018   ??? PNEUMOCOCCAL POLYSACCHARIDE 23 06/10/2004, 12/30/2017   ??? Pneumococcal Conjugate 13-Valent 03/25/2016   ??? SHINGRIX-ZOSTER VACCINE (HZV), RECOMBINANT,SUB-UNIT,ADJUVANTED IM 03/10/2020, 08/20/2020   ??? TdaP 02/15/2012, 02/11/2018       >19min was spent with the patient face to face and >57min was spent reviewing chart/imaging.    Complex medical decision making was done as we adjust medications based on blood work/drug levels and multiple complaints and problems were addresed

## 2020-10-04 LAB — CMV DNA, QUANTITATIVE, PCR: CMV VIRAL LD: NOT DETECTED

## 2020-10-05 LAB — EBV QUANTITATIVE PCR, BLOOD: EBV VIRAL LOAD RESULT: NOT DETECTED

## 2020-10-07 ENCOUNTER — Encounter: Admit: 2020-10-07 | Discharge: 2020-10-08 | Payer: PRIVATE HEALTH INSURANCE

## 2020-10-07 DIAGNOSIS — Z942 Lung transplant status: Principal | ICD-10-CM

## 2020-10-07 LAB — COMPREHENSIVE METABOLIC PANEL
ALBUMIN: 3.5 g/dL (ref 3.4–5.0)
ALKALINE PHOSPHATASE: 76 U/L (ref 46–116)
ALT (SGPT): 67 U/L — ABNORMAL HIGH (ref 10–49)
ANION GAP: 6 mmol/L (ref 5–14)
AST (SGOT): 33 U/L (ref <=34)
BILIRUBIN TOTAL: 0.3 mg/dL (ref 0.3–1.2)
BLOOD UREA NITROGEN: 37 mg/dL — ABNORMAL HIGH (ref 9–23)
BUN / CREAT RATIO: 27
CALCIUM: 9.2 mg/dL (ref 8.7–10.4)
CHLORIDE: 107 mmol/L (ref 98–107)
CO2: 28.9 mmol/L (ref 20.0–31.0)
CREATININE: 1.36 mg/dL — ABNORMAL HIGH
EGFR CKD-EPI (2021) MALE: 56 mL/min/{1.73_m2} — ABNORMAL LOW (ref >=60)
GLUCOSE RANDOM: 89 mg/dL (ref 70–179)
POTASSIUM: 4.2 mmol/L (ref 3.4–4.8)
PROTEIN TOTAL: 5.8 g/dL (ref 5.7–8.2)
SODIUM: 142 mmol/L (ref 135–145)

## 2020-10-07 LAB — TACROLIMUS LEVEL: TACROLIMUS BLOOD: 12.1 ng/mL

## 2020-10-07 MED ORDER — PRAVASTATIN 40 MG TABLET
ORAL_TABLET | 11 refills | 0 days
Start: 2020-10-07 — End: ?

## 2020-10-07 MED ORDER — AZATHIOPRINE 50 MG TABLET
ORAL_TABLET | Freq: Every day | ORAL | 3 refills | 90 days | Status: CP
Start: 2020-10-07 — End: ?
  Filled 2020-11-19: qty 90, 90d supply, fill #0

## 2020-10-07 MED ORDER — MAGNESIUM OXIDE 400 MG (241.3 MG MAGNESIUM) TABLET
ORAL_TABLET | Freq: Two times a day (BID) | ORAL | 11 refills | 30 days | Status: CP
Start: 2020-10-07 — End: 2021-10-07
  Filled 2020-10-14: qty 180, 30d supply, fill #0

## 2020-10-07 MED ORDER — INSULIN GLARGINE (U-100) 100 UNIT/ML (3 ML) SUBCUTANEOUS PEN
Freq: Every evening | SUBCUTANEOUS | 3 refills | 100 days | Status: CP
Start: 2020-10-07 — End: 2021-10-07

## 2020-10-07 NOTE — Unmapped (Signed)
Called Spencer Mallet Sr. to discuss lab results. Tacrolimus level was increased at 12.1 with a goal of 7-9. Medication regimen is currently 1 mg BID. Discussed with [name], CPP, dosing at this time to remain the same. Repeat labs scheduled one week. Resulted labs reviewed. All questions answered. Pt verbalized understanding.

## 2020-10-08 MED ORDER — PRAVASTATIN 40 MG TABLET
ORAL_TABLET | Freq: Every day | ORAL | 11 refills | 30.00000 days | Status: CN
Start: 2020-10-08 — End: ?
  Filled 2020-10-14: qty 30, 30d supply, fill #0

## 2020-10-08 NOTE — Unmapped (Signed)
[facility] Refill Coordination Note    Specialty Medication(s) to be Shipped:   Transplant: valgancyclovir 450mg  and Prednisone 5mg     Other medication(s) to be shipped: advair, azithromycin, calcium, vitamin D, magnesium, test strips, lancets and pravastatin     Spencer Pena Sr., DOB: 27-Oct-1950  Phone: [phone] (home) [phone] (work)      All above HIPAA information was verified with patient's caregiver, [name]     Was a Nurse, learning disability used for this call? No    Completed refill call assessment today to schedule patient's medication shipment from the [facility] [phone]).  All relevant notes have been reviewed.     Specialty medication(s) and dose(s) confirmed: Regimen is correct and unchanged.   Changes to medications: Neev reports no changes at this time.  Changes to insurance: No  New side effects reported not previously addressed with a pharmacist or physician: None reported  Questions for the pharmacist: No    Confirmed patient received a Conservation officer, historic buildings and a Surveyor, mining with first shipment. The patient will receive a drug information handout for each medication shipped and additional FDA Medication Guides as required.       DISEASE/MEDICATION-SPECIFIC INFORMATION        N/A    SPECIALTY MEDICATION ADHERENCE     Medication Adherence    Patient reported X missed doses in the last month: 0  Specialty Medication: Prednisone 5mg   Patient is on additional specialty medications: Yes  Additional Specialty Medications: Valganciclovir 450mg   Patient Reported Additional Medication X Missed Doses in the Last Month: 0  Patient is on more than two specialty medications: No        Were doses missed due to medication being on hold? No    Prednisone 5 mg: 10 days of medicine on hand   Valganciclovir 450 mg: 10 days of medicine on hand     REFERRAL TO PHARMACIST     Referral to the pharmacist: Not needed      [facility]     Shipping address confirmed in Epic. Delivery Scheduled: Yes, Expected medication delivery date: 10/15/2020.     Medication will be delivered via UPS to the prescription address in Epic WAM.    Lorelei Pont [facility] Specialty Technician

## 2020-10-09 MED FILL — TERAZOSIN 1 MG CAPSULE: ORAL | 30 days supply | Qty: 90 | Fill #1

## 2020-10-14 ENCOUNTER — Ambulatory Visit: Admit: 2020-10-14 | Discharge: 2020-10-15 | Payer: PRIVATE HEALTH INSURANCE

## 2020-10-14 DIAGNOSIS — Z942 Lung transplant status: Principal | ICD-10-CM

## 2020-10-14 LAB — COMPREHENSIVE METABOLIC PANEL
ALBUMIN: 3.7 g/dL (ref 3.4–5.0)
ALKALINE PHOSPHATASE: 81 U/L (ref 46–116)
ALT (SGPT): 57 U/L — ABNORMAL HIGH (ref 10–49)
ANION GAP: 4 mmol/L — ABNORMAL LOW (ref 5–14)
AST (SGOT): 35 U/L — ABNORMAL HIGH (ref <=34)
BILIRUBIN TOTAL: 0.4 mg/dL (ref 0.3–1.2)
BLOOD UREA NITROGEN: 30 mg/dL — ABNORMAL HIGH (ref 9–23)
BUN / CREAT RATIO: 25
CALCIUM: 9 mg/dL (ref 8.7–10.4)
CHLORIDE: 106 mmol/L (ref 98–107)
CO2: 31.6 mmol/L — ABNORMAL HIGH (ref 20.0–31.0)
CREATININE: 1.2 mg/dL — ABNORMAL HIGH
EGFR CKD-EPI (2021) MALE: 65 mL/min/{1.73_m2} (ref >=60)
GLUCOSE RANDOM: 79 mg/dL (ref 70–179)
POTASSIUM: 4.2 mmol/L (ref 3.4–4.8)
PROTEIN TOTAL: 6.1 g/dL (ref 5.7–8.2)
SODIUM: 142 mmol/L (ref 135–145)

## 2020-10-14 LAB — TACROLIMUS LEVEL: TACROLIMUS BLOOD: 11.6 ng/mL

## 2020-10-14 MED FILL — AZITHROMYCIN 250 MG TABLET: ORAL | 30 days supply | Qty: 30 | Fill #4

## 2020-10-14 MED FILL — CALCIUM CARBONATE 600 MG CALCIUM (1,500 MG) TABLET: ORAL | 30 days supply | Qty: 60 | Fill #4

## 2020-10-14 MED FILL — ON CALL EXPRESS TEST STRIP: 20 days supply | Qty: 100 | Fill #3

## 2020-10-14 MED FILL — CHOLECALCIFEROL (VITAMIN D3) 25 MCG (1,000 UNIT) TABLET: ORAL | 50 days supply | Qty: 100 | Fill #2

## 2020-10-14 MED FILL — PREDNISONE 5 MG TABLET: ORAL | 30 days supply | Qty: 90 | Fill #1

## 2020-10-14 MED FILL — VALGANCICLOVIR 450 MG TABLET: ORAL | 30 days supply | Qty: 30 | Fill #3

## 2020-10-14 MED FILL — ON CALL LANCET 30 GAUGE: 20 days supply | Qty: 100 | Fill #3

## 2020-10-14 NOTE — Unmapped (Signed)
Called Spencer Mallet Sr. to discuss lab results. Tacrolimus level was increased at 11.6 with a goal of 7-9. Medication regimen is currently 1 mg BID. Discussed with [name], CPP, dosing at this time to remain the same. Repeat labs scheduled one week. Resulted labs reviewed. All questions answered. Pt verbalized understanding.

## 2020-10-15 LAB — FSAB CLASS 2 ANTIBODY SPECIFICITY: HLA CL2 AB RESULT: NEGATIVE

## 2020-10-15 LAB — HLA DS POST TRANSPLANT
ANTI-DONOR DRW #1 MFI: 0 MFI
ANTI-DONOR DRW #2 MFI: 0 MFI
ANTI-DONOR HLA-A #1 MFI: 0 MFI
ANTI-DONOR HLA-A #2 MFI: 0 MFI
ANTI-DONOR HLA-B #1 MFI: 0 MFI
ANTI-DONOR HLA-B #2 MFI: 0 MFI
ANTI-DONOR HLA-C #1 MFI: 0 MFI
ANTI-DONOR HLA-C #2 MFI: 0 MFI
ANTI-DONOR HLA-DP AG #1 MFI: 0 MFI
ANTI-DONOR HLA-DQB #1 MFI: 0 MFI
ANTI-DONOR HLA-DQB #2 MFI: 0 MFI
ANTI-DONOR HLA-DR #1 MFI: 0 MFI
ANTI-DONOR HLA-DR #2 MFI: 0 MFI

## 2020-10-15 LAB — FSAB CLASS 1 ANTIBODY SPECIFICITY: HLA CLASS 1 ANTIBODY RESULT: NEGATIVE

## 2020-10-20 ENCOUNTER — Encounter: Admit: 2020-10-20 | Discharge: 2020-10-21 | Payer: PRIVATE HEALTH INSURANCE

## 2020-10-20 LAB — COMPREHENSIVE METABOLIC PANEL
ALBUMIN: 3.7 g/dL (ref 3.4–5.0)
ALKALINE PHOSPHATASE: 79 U/L (ref 46–116)
ALT (SGPT): 40 U/L (ref 10–49)
ANION GAP: 7 mmol/L (ref 5–14)
AST (SGOT): 25 U/L (ref <=34)
BILIRUBIN TOTAL: 0.4 mg/dL (ref 0.3–1.2)
BLOOD UREA NITROGEN: 35 mg/dL — ABNORMAL HIGH (ref 9–23)
BUN / CREAT RATIO: 30
CALCIUM: 9.4 mg/dL (ref 8.7–10.4)
CHLORIDE: 104 mmol/L (ref 98–107)
CO2: 29.9 mmol/L (ref 20.0–31.0)
CREATININE: 1.15 mg/dL — ABNORMAL HIGH
EGFR CKD-EPI (2021) MALE: 68 mL/min/{1.73_m2} (ref >=60)
GLUCOSE RANDOM: 96 mg/dL (ref 70–179)
POTASSIUM: 4.6 mmol/L (ref 3.4–4.8)
PROTEIN TOTAL: 6.1 g/dL (ref 5.7–8.2)
SODIUM: 141 mmol/L (ref 135–145)

## 2020-10-20 LAB — CBC W/ AUTO DIFF
BASOPHILS ABSOLUTE COUNT: 0 10*9/L (ref 0.0–0.1)
BASOPHILS RELATIVE PERCENT: 1.1 %
EOSINOPHILS ABSOLUTE COUNT: 0 10*9/L (ref 0.0–0.5)
EOSINOPHILS RELATIVE PERCENT: 1.1 %
HEMATOCRIT: 35.4 % — ABNORMAL LOW (ref 39.0–48.0)
HEMOGLOBIN: 12 g/dL — ABNORMAL LOW (ref 12.9–16.5)
LYMPHOCYTES ABSOLUTE COUNT: 0.8 10*9/L — ABNORMAL LOW (ref 1.1–3.6)
LYMPHOCYTES RELATIVE PERCENT: 20.4 %
MEAN CORPUSCULAR HEMOGLOBIN CONC: 33.8 g/dL (ref 32.0–36.0)
MEAN CORPUSCULAR HEMOGLOBIN: 32.2 pg (ref 25.9–32.4)
MEAN CORPUSCULAR VOLUME: 95.2 fL (ref 77.6–95.7)
MEAN PLATELET VOLUME: 7.1 fL (ref 6.8–10.7)
MONOCYTES ABSOLUTE COUNT: 0.3 10*9/L (ref 0.3–0.8)
MONOCYTES RELATIVE PERCENT: 6.8 %
NEUTROPHILS ABSOLUTE COUNT: 2.6 10*9/L (ref 1.8–7.8)
NEUTROPHILS RELATIVE PERCENT: 70.6 %
NUCLEATED RED BLOOD CELLS: 0 /100{WBCs} (ref <=4)
PLATELET COUNT: 125 10*9/L — ABNORMAL LOW (ref 150–450)
RED BLOOD CELL COUNT: 3.72 10*12/L — ABNORMAL LOW (ref 4.26–5.60)
RED CELL DISTRIBUTION WIDTH: 16.6 % — ABNORMAL HIGH (ref 12.2–15.2)
WBC ADJUSTED: 3.7 10*9/L (ref 3.6–11.2)

## 2020-10-20 LAB — IGG: GAMMAGLOBULIN; IGG: 481 mg/dL — ABNORMAL LOW (ref 646–2013)

## 2020-10-20 LAB — TACROLIMUS LEVEL: TACROLIMUS BLOOD: 9.7 ng/mL

## 2020-10-20 LAB — PHOSPHORUS: PHOSPHORUS: 2.7 mg/dL (ref 2.4–5.1)

## 2020-10-20 LAB — MAGNESIUM: MAGNESIUM: 1.7 mg/dL (ref 1.6–2.6)

## 2020-10-20 NOTE — Unmapped (Signed)
Called [name] to discuss lab results. Tacrolimus level was slightly  increased at 9.7 with a goal of 7-9. Medication regimen is currently 1 mg BID. Discussed with [name], CPP, dosing at this time to remain the same. Repeat labs scheduled two weeks. Resulted labs reviewed. All questions answered. Pt verbalized understanding.

## 2020-10-20 NOTE — Unmapped (Signed)
Called Spencer Pena Sr. to discuss lab results. Tacrolimus level was slightly  increased at 9.7 with a goal of 7-9. Medication regimen is currently 1 mg BID. Discussed with [name], CPP, dosing at this time to remain the same. Repeat labs scheduled two weeks. Resulted labs reviewed. All questions answered. Pt verbalized understanding.

## 2020-10-21 LAB — CMV DNA, QUANTITATIVE, PCR: CMV VIRAL LD: NOT DETECTED

## 2020-10-21 LAB — EBV QUANTITATIVE PCR, BLOOD: EBV VIRAL LOAD RESULT: NOT DETECTED

## 2020-10-21 MED ORDER — METFORMIN 1,000 MG TABLET
ORAL_TABLET | Freq: Two times a day (BID) | ORAL | 3 refills | 90 days | Status: CP
Start: 2020-10-21 — End: 2021-01-19
  Filled 2020-10-22: qty 180, 90d supply, fill #0

## 2020-11-03 ENCOUNTER — Encounter: Admit: 2020-11-03 | Discharge: 2020-11-04 | Payer: PRIVATE HEALTH INSURANCE

## 2020-11-03 LAB — COMPREHENSIVE METABOLIC PANEL
ALBUMIN: 3.7 g/dL (ref 3.4–5.0)
ALKALINE PHOSPHATASE: 79 U/L (ref 46–116)
ALT (SGPT): 25 U/L (ref 10–49)
ANION GAP: 8 mmol/L (ref 5–14)
AST (SGOT): 25 U/L (ref <=34)
BILIRUBIN TOTAL: 0.5 mg/dL (ref 0.3–1.2)
BLOOD UREA NITROGEN: 30 mg/dL — ABNORMAL HIGH (ref 9–23)
BUN / CREAT RATIO: 27
CALCIUM: 9.6 mg/dL (ref 8.7–10.4)
CHLORIDE: 106 mmol/L (ref 98–107)
CO2: 28.1 mmol/L (ref 20.0–31.0)
CREATININE: 1.11 mg/dL — ABNORMAL HIGH
EGFR CKD-EPI (2021) MALE: 71 mL/min/{1.73_m2} (ref >=60)
GLUCOSE RANDOM: 93 mg/dL (ref 70–179)
POTASSIUM: 3.9 mmol/L (ref 3.4–4.8)
PROTEIN TOTAL: 6.3 g/dL (ref 5.7–8.2)
SODIUM: 142 mmol/L (ref 135–145)

## 2020-11-03 LAB — SLIDE REVIEW

## 2020-11-03 LAB — CBC W/ AUTO DIFF
BASOPHILS ABSOLUTE COUNT: 0 10*9/L (ref 0.0–0.1)
BASOPHILS RELATIVE PERCENT: 0.9 %
EOSINOPHILS ABSOLUTE COUNT: 0 10*9/L (ref 0.0–0.5)
EOSINOPHILS RELATIVE PERCENT: 1.1 %
HEMATOCRIT: 36.6 % — ABNORMAL LOW (ref 39.0–48.0)
HEMOGLOBIN: 12.2 g/dL — ABNORMAL LOW (ref 12.9–16.5)
LYMPHOCYTES ABSOLUTE COUNT: 0.6 10*9/L — ABNORMAL LOW (ref 1.1–3.6)
LYMPHOCYTES RELATIVE PERCENT: 20.4 %
MEAN CORPUSCULAR HEMOGLOBIN CONC: 33.4 g/dL (ref 32.0–36.0)
MEAN CORPUSCULAR HEMOGLOBIN: 32.3 pg (ref 25.9–32.4)
MEAN CORPUSCULAR VOLUME: 96.7 fL — ABNORMAL HIGH (ref 77.6–95.7)
MEAN PLATELET VOLUME: 7.2 fL (ref 6.8–10.7)
MONOCYTES ABSOLUTE COUNT: 0.3 10*9/L (ref 0.3–0.8)
MONOCYTES RELATIVE PERCENT: 8.8 %
NEUTROPHILS ABSOLUTE COUNT: 2 10*9/L (ref 1.8–7.8)
NEUTROPHILS RELATIVE PERCENT: 68.8 %
NUCLEATED RED BLOOD CELLS: 0 /100{WBCs} (ref <=4)
PLATELET COUNT: 123 10*9/L — ABNORMAL LOW (ref 150–450)
RED BLOOD CELL COUNT: 3.78 10*12/L — ABNORMAL LOW (ref 4.26–5.60)
RED CELL DISTRIBUTION WIDTH: 15.7 % — ABNORMAL HIGH (ref 12.2–15.2)
WBC ADJUSTED: 2.9 10*9/L — ABNORMAL LOW (ref 3.6–11.2)

## 2020-11-03 LAB — MAGNESIUM: MAGNESIUM: 1.8 mg/dL (ref 1.6–2.6)

## 2020-11-03 LAB — IGG: GAMMAGLOBULIN; IGG: 481 mg/dL — ABNORMAL LOW (ref 646–2013)

## 2020-11-03 LAB — TACROLIMUS LEVEL, TROUGH: TACROLIMUS, TROUGH: 8.5 ng/mL (ref 5.0–15.0)

## 2020-11-03 LAB — PHOSPHORUS: PHOSPHORUS: 2.9 mg/dL (ref 2.4–5.1)

## 2020-11-03 NOTE — Unmapped (Signed)
Called Spencer Mallet Sr. to discuss lab results. Tacrolimus level was normal at 8.5 with a goal of 7-9. Medication regimen is currently 1 mg BID. Dosing at this time to remain the same. Repeat labs scheduled monthly. Resulted labs reviewed. All questions answered. Pt verbalized understanding.

## 2020-11-04 NOTE — Unmapped (Signed)
[facility] Refill Coordination Note    Specialty Medication(s) to be Shipped:   Transplant: valgancyclovir 450mg  and Prednisone 5mg     Other medication(s) to be shipped: Azithromycin, Calcium, Magnesium, Pravastatin, Novolog, Advair, Pen needles, Lancets, Test strips and Terazosin.     Spencer Pena Sr., DOB: 05-Jul-1950  Phone: [phone] (home) [phone] (work)      All above HIPAA information was verified with patient's family member, Spouse.     Was a Nurse, learning disability used for this call? No    Completed refill call assessment today to schedule patient's medication shipment from the [facility] [phone]).  All relevant notes have been reviewed.     Specialty medication(s) and dose(s) confirmed: Regimen is correct and unchanged.   Changes to medications: Kepler reports no changes at this time.  Changes to insurance: No  New side effects reported not previously addressed with a pharmacist or physician: None reported  Questions for the pharmacist: No    Confirmed patient received a Conservation officer, historic buildings and a Surveyor, mining with first shipment. The patient will receive a drug information handout for each medication shipped and additional FDA Medication Guides as required.       DISEASE/MEDICATION-SPECIFIC INFORMATION        N/A    SPECIALTY MEDICATION ADHERENCE     Medication Adherence    Patient reported X missed doses in the last month: 0  Specialty Medication: Valganciclovir 450mg   Patient is on additional specialty medications: Yes  Additional Specialty Medications: Prednisone 5mg   Patient Reported Additional Medication X Missed Doses in the Last Month: 0  Patient is on more than two specialty medications: No  Informant: patient              Were doses missed due to medication being on hold? No    Valganciclovir 450 mg: 10 days of medicine on hand   Prednisone 5 mg: 10 days of medicine on hand       REFERRAL TO PHARMACIST     Referral to the pharmacist: Not needed      [facility]     Shipping address confirmed in Epic.     Delivery Scheduled: Yes, Expected medication delivery date: 11/07/20.     Medication will be delivered via Same Day Courier to the prescription address in Epic [place].    Wyatt Mage M Elisabeth Cara   [facility] Specialty Technician

## 2020-11-07 MED FILL — ADVAIR HFA 115 MCG-21 MCG/ACTUATION AEROSOL INHALER: RESPIRATORY_TRACT | 30 days supply | Qty: 12 | Fill #1

## 2020-11-07 MED FILL — MAGNESIUM OXIDE 400 MG (241.3 MG MAGNESIUM) TABLET: ORAL | 30 days supply | Qty: 180 | Fill #1

## 2020-11-07 MED FILL — AZITHROMYCIN 250 MG TABLET: ORAL | 30 days supply | Qty: 30 | Fill #5

## 2020-11-07 MED FILL — VALGANCICLOVIR 450 MG TABLET: ORAL | 30 days supply | Qty: 30 | Fill #4

## 2020-11-07 MED FILL — BD ULTRA-FINE NANO PEN NEEDLE 32 GAUGE X 5/32" (4 MM): SUBCUTANEOUS | 40 days supply | Qty: 200 | Fill #2

## 2020-11-07 MED FILL — NOVOLOG FLEXPEN U-100 INSULIN ASPART 100 UNIT/ML (3 ML) SUBCUTANEOUS: 30 days supply | Qty: 15 | Fill #2

## 2020-11-07 MED FILL — ON CALL LANCET 30 GAUGE: 20 days supply | Qty: 100 | Fill #4

## 2020-11-07 MED FILL — TERAZOSIN 1 MG CAPSULE: ORAL | 30 days supply | Qty: 90 | Fill #2

## 2020-11-07 MED FILL — ON CALL EXPRESS TEST STRIP: 20 days supply | Qty: 100 | Fill #4

## 2020-11-07 MED FILL — PRAVASTATIN 40 MG TABLET: ORAL | 30 days supply | Qty: 30 | Fill #1

## 2020-11-07 MED FILL — CALCIUM CARBONATE 600 MG CALCIUM (1,500 MG) TABLET: ORAL | 30 days supply | Qty: 60 | Fill #5

## 2020-11-07 MED FILL — PREDNISONE 5 MG TABLET: ORAL | 30 days supply | Qty: 90 | Fill #2

## 2020-11-17 DIAGNOSIS — Z942 Lung transplant status: Principal | ICD-10-CM

## 2020-11-19 MED FILL — PANTOPRAZOLE 40 MG TABLET,DELAYED RELEASE: ORAL | 90 days supply | Qty: 180 | Fill #0

## 2020-11-19 MED FILL — ACETAMINOPHEN 500 MG TABLET: ORAL | 23 days supply | Qty: 180 | Fill #1

## 2020-11-19 MED FILL — SULFAMETHOXAZOLE 400 MG-TRIMETHOPRIM 80 MG TABLET: ORAL | 84 days supply | Qty: 36 | Fill #2

## 2020-11-25 NOTE — Unmapped (Signed)
Pulmonary Transplant Clinic    HISTORY:   HPI:  70 y.o. 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, also with type 2 diabetes mellitus, hypertension, colon cancer in 2007 s/p partial colon and hepatic wedge resection and FOLFOX here for transplant complications    - Feeling great overall  - Denies cough, SOB, sputum production, fevers, chills  - Home PFTs are steady  - Denies reflux/heartburn, even when accidentally off PPI last week.  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     --> basiliximab induction    --> CMV high risk (D+/R-)    --> EBV moderate risk (D+/R+)    --> Not Increased Risk Donor  - Colon Cancer 2007    --> s/p resection of liver mets    --> s/p resection of 6 inches of colon    --> FOLFOX Chemo  - type 2 diabetes mellitus     --> not insulin dependent  - OA    SOCIAL HISTORY:   - 20pk years, quit in 1991  - drinks about 10 drinks a week but will stop  - Works maintenance at [employer]  - Married and lives in [place]    FAMILY HISTORY:   - Parents lived into late 90's    MEDS: (personally reviewed in EPIC, pertinent meds noted below)         OBJECTIVE DATA:   PHYSICAL EXAM:  BP 137/67  - Pulse 100  - Temp 37.1 ??C (98.8 ??F) (Tympanic)  - Ht 170.2 cm (5' 7.01)  - Wt 74.2 kg (163 lb 8 oz)  - SpO2 98%  - BMI 25.60 kg/m??      Gen: - awake, alert, in NAD  Derm: - well-healing surgical scars s/p BOLT, right sided chest tube sites healing very well, left sided chest tube sites healing well but remain scarred over  Vascular: - good pulses throughout,   CV: - RRR, no m/r/g  Pulm: - Normal symmetric excursion, Normal WOB on RA. Good air movement bilaterally, without wheezes or crackles including with forced expiraiton. Normal upper airway sounds without evidence of stridor  Abd: - soft, NT, ND, +BS, no hepatosplenomegally  Ext: - no edema of LE, no clubbing  Skin: mild redness over anterior chest, no warmth or TTP   Neuro: - Alert, oriented, follows commands. Moving all extremities spontaneously    LABS: (reviewed in Epic, pertinent values noted below)     PFTs:  (personally reveiewed and interpreted):    Date: FVC (% Pred) FEV1 (% Pred) FEF25-75(% Pred) DLCO TBBx/Results                                                                   12/03/2020 3.87 (104.9%) 3.47 (122.9%) 5.69 (258.3%)     10/03/2020 3.68 (99.4) 3.13 (110.6)  4.22 (190.6)      09/05/20  3.70  3.14      08/20/20 3.65 (98%) 3.15 (111%) 4.69 (  211%)      08/01/20 3.64 (98.2%) 3.10 (109.3%) 4.41 (198.8%)     07/18/20 3.49 (94%) 2.98 (105%) 4.20 (189%)     07/04/20 3.43 (92.5%) 2.97 (104.7%) 4.26 (191.7%)     06/27/2020 3.13 84.5 2.70 95.4 3.51 157.8     06/20/20 3.39 (91.4%) 2.82 (99.2%) 3.34 (150.35)     06/13/20 3.10 (72.8%) 2.66 (82.5%) 3.34 (136.2%)      05/15/20      BOLT    03/11/20  2.75 (72%)  2.36 (81%)       02/12/20  2.85 (75%)  2.44 (86%)                IMAGING: (Personally reviewed, our interpretation is below)  CXR 08/20/20: stable blunting of R costophrenic angle with elevated R hemidiaphragm. NO acute changes     CXR 07/18/2020:  Small right pleural effusion vs mild elevation of the right hemidiaphragm, stable since early 06/2020    ASSESSMENT and PLAN   70 y.o. 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, also with type 2 diabetes mellitus, hypertension, colon cancer in 2007 s/p partial colon and hepatic wedge resection and FOLFOX here for follow up    Graft Function and Immunosuppression:  - PFTs significantly increased, CXR clear, clinically feeling well  - HLA status: No DSAs, checked monthly, last 09/2020  - Total IgG >500 but insurance will not approve IVIG  - FK goal 7-9, current dose 1mg  BID... consider envarsus  - Imuran 75 mg daily; dropped from 100 mg 08/01/20 given good PFT's, age, renal fx and total IgG. Dropped to 50 mg daily given renal function and low IgG.    --> Stopped MMF 06/13/20 (GI upset).    - Prednisone 15mg  daily (decreased last visit), will decrease to 10mg  today as 6mos out with excellent PFTs  - Azithro BOS ppx, aspirin 81 mg daily  - Restarted prava 40mg  QD, has elevated LFTs again, will continue at 40 mg  - Start Advair today (not on Montelukast, but on Azithro)    Antimicrobial Prophylaxis:  - CMV, high risk (D+/R-): valganciclovir 450mg  daily for Estimated Creatinine Clearance: 48.3 mL/min (A) (based on SCr of 1.33 mg/dL (H)). Negative 06/20/20  - EBV, (D+/R+): negative 06/20/20  - Fungal: Posaconazole stopped at 65mo  - PCP: Bactrim SS MWF   - tamiflu during flu season, stopped     Pain control:  - Moderately well controlled  - Previously on gabapentin 100mg  TID, now off  - Tylenol TID prn    Concern for OSA: STOP-BANG 4 points (male, age, observed apnea, snoring)  - will order sleep study (has never had one)    Loose stools: resolved 2 to 3 weeks ago  - Improved with switch from MMF to imuran  - Encouraged him to avoid sugar free foods as this exacerbates symptoms.  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  - pH probe with DeMeester 16.7%, slightly in abnormal range but will hold off on surgical intervention at this time in the setting of normal graft function.    --> On daily PPI    BPH  - Increased terazosin to 2mg  nightly to help symptoms  and BP, and symptoms have resolved... increase to 4mg  as posa washing out     Blood Pressure Management:  - Home BPs stable  - Not currently on therapy    Kidney Function:  - Cr baseline 0.8-1.13  - Cr continues rising  - Lasix on hold, no increase in LE edema    Iron Deficiency Anemia:   - received IV iron 05/22/20, continue to be low    Hypomagnesemia: Continue to be low  - Increase 800 mg BID to 1200 mg BID today  ??  Antiarrythmic Prophylaxis:   - finished amiodarone without arrhythmia.     Type 2 diabetes mellitus   - Lantus will drop to 12 units in the nightly with pred adjustment  - aspart 7/7/3 with pred adjustment  - metformin 1000mg  BID  - sugars well controlled, apart from some lower morning sugars (16-109)    Bone Health: Last Dexa: Oct 2021, no osteopenia or osteoporosis  - on Ca and Vit D supplementation currently, last Vit D 27 on 05/24/20    Health Maintenance:  - Last derm visit:  Pre-txp  - Last Colonsocopy (if >40): 12/2016 Diverticulosis and internal hemorrhoids  - Last flu shot:   02/08/2020  - Last Pneumovax:  06/10/2004, 12/30/2017  - Last Prevnar:  03/25/2016  - TdaP:   02/11/2018  - Shingrix:    03/10/20, 08/20/20  - COVID-19 (Moderna) 06/11/19, 07/09/19, 03/10/20, 07/18/2020, received 1/2 dose Evushield 06/20/20, and additional dose 08/01/20.  Due for 5th moderna vaccine 11/17/2020, given today 12/03/2020.  - Last Dexa:   02/2020  - Last vitamin D:  51.2 (07/18/2020)  - Last HbA1c:   6.0 (06/13/2020), repeat pending.    Immunization History   Administered Date(s) Administered   ??? COVID-19 VACCINE,MRNA(MODERNA)(PF)(IM) 06/11/2019, 07/09/2019, 03/10/2020, 07/18/2020   ??? HEPB-CPG,ADULT DOSAGE ADJUVANTED, IM 03/12/2020   ??? INFLUENZA INJ MDCK PF, QUAD,(FLUCELVAX)(3MO AND UP EGG FREE) 02/08/2020   ??? INFLUENZA TIV (TRI) PF (IM) 02/27/2010   ??? Influenza Vaccine Quad (IIV4 PF) 51mo+ injectable 01/19/2019   ??? Influenza Virus Vaccine, unspecified formulation 02/07/2013, 02/28/2015, 02/05/2016, 02/02/2017, 01/27/2018   ??? PNEUMOCOCCAL POLYSACCHARIDE 23 06/10/2004, 12/30/2017   ??? Pneumococcal Conjugate 13-Valent 03/25/2016   ??? SHINGRIX-ZOSTER VACCINE (HZV), RECOMBINANT,SUB-UNIT,ADJUVANTED IM 03/10/2020, 08/20/2020   ??? TdaP 02/15/2012, 02/11/2018       >36min was spent with the patient face to face and >57min was spent reviewing chart/imaging.     Complex medical decision making was done as we adjust medications based on blood work/drug levels and multiple complaints and problems were addressed    The patient was assessed and discussed with Dr. Dudley Major who is in agreement with plan.    Sharee Pimple MSN, FNP-C  December 03, 2020 9:19 AM  Lung Transplant Nurse Practitioner  Pager [phone]

## 2020-12-02 NOTE — Unmapped (Signed)
[facility] Clinical Assessment & Refill Coordination Note    Spencer BERENSON Sr., DOB: August 13, 1950  Phone: [phone] (home) [phone] (work)    All above HIPAA information was verified with patient's family member, Spoke to Spencer Pena today..     Was a translator used for this call? No    Specialty Medication(s):   Transplant: valgancyclovir 450mg , Prednisone 5mg  and Azathioprine 50mg      Current Outpatient Medications   Medication Sig Dispense Refill   ??? acetaminophen (TYLENOL EXTRA STRENGTH) 500 MG tablet Take 1-2 tablets (500-1,000 mg total) by mouth every six (6) hours as needed for pain. 180 tablet PRN   ??? aspirin (ECOTRIN) 81 MG tablet Take 1 tablet (81 mg total) by mouth daily. 90 tablet 3   ??? azaTHIOprine (IMURAN) 50 mg tablet Take 1 tablet (50 mg total) by mouth daily. 90 tablet 3   ??? azithromycin (ZITHROMAX) 250 MG tablet Take 1 tablet (250 mg total) by mouth daily. 30 tablet 11   ??? blood sugar diagnostic (ON CALL EXPRESS TEST STRIP) Strp Test daily before all meals/snacks and once before bedtime. 100 each 11   ??? calcium carbonate (CALCIUM 600) 1,500 mg (600 mg elem calcium) tablet Take 1 tablet (600 mg of elem calcium total) by mouth Two (2) times a day. 60 tablet 11   ??? cholecalciferol, vitamin D3 25 mcg, 1,000 units,, 1,000 unit (25 mcg) tablet Take 2 tablets (50 mcg total) by mouth daily. 100 tablet 11   ??? fluticasone propion-salmeteroL (ADVAIR HFA) 115-21 mcg/actuation inhaler Inhale 2 puffs Two (2) times a day. 12 g 11   ??? insulin ASPART (NOVOLOG FLEXPEN U-100 INSULIN) 100 unit/mL (3 mL) injection pen Inject 9 units with breakfast (12 units with high sugar breakfast), 9 units with afternoon meal, and 4 units with evening snack plus sliding scale 2 units for every 50 >150; max 50 units/day 15 mL 2   ??? insulin glargine (BASAGLAR, LANTUS) 100 unit/mL (3 mL) injection pen Inject 0.15 mL (15 Units total) under the skin nightly. 15 mL 3   ??? lancets 30 gauge Misc Test daily before all meals/snacks and once before bedtime. 100 each 11   ??? magnesium oxide (MAG-OX) 400 mg (241.3 mg elemental magnesium) tablet Take 3 tablets (1,200 mg total) by mouth Two (2) times a day. 180 tablet 11   ??? metFORMIN (GLUCOPHAGE) 1000 MG tablet Take 1 tablet (1,000 mg total) by mouth Two (2) times a day. 180 tablet 3   ??? pantoprazole (PROTONIX) 40 MG tablet Take 1 tablet (40 mg total) by mouth Two (2) times a day. 180 tablet 3   ??? pen needle, diabetic (BD ULTRA-FINE NANO PEN NEEDLE) 32 gauge x 5/32 (4 mm) Ndle Use as directed for injections five (5) times a day. 150 each 11   ??? pravastatin (PRAVACHOL) 40 MG tablet Take 1 tablet (40 mg total) by mouth daily. 30 tablet 11   ??? predniSONE (DELTASONE) 5 MG tablet Take 3 tablets (15 mg total) by mouth daily. 90 tablet 11   ??? sulfamethoxazole-trimethoprim (BACTRIM) 400-80 mg per tablet Take 1 tablet (80 mg of trimethoprim total) by mouth Every Monday, Wednesday, and Friday. 36 tablet 3   ??? tacrolimus (PROGRAF) 0.5 MG capsule Take 1 capsule (0.5 mg total) by mouth nightly. Take with one 1 mg capsule in PM (2 mg AM /1.5 mg PM) 90 capsule 3   ??? tacrolimus (PROGRAF) 1 MG capsule Take 1 capsule (1 mg total) by  mouth two (2) times a day. 180 capsule 3   ??? terazosin (HYTRIN) 1 MG capsule Take 3 capsules (3 mg total) by mouth nightly. 90 capsule 11   ??? valGANciclovir (VALCYTE) 450 mg tablet Take 1 tablet (450 mg total) by mouth daily. 30 tablet 11     No current facility-administered medications for this visit.        Changes to medications: Spencer Pena reports no changes at this time.    Allergies   Allergen Reactions   ??? Cetuximab Anaphylaxis     Chest pain/rapid heart rate/ BP dropped   ??? Nsaids (Non-Steroidal Anti-Inflammatory Drug)      Interaction with transplant medications   ??? Venom-Honey Bee Anaphylaxis   ??? Enalapril      Angioedema     ??? Pollen Extracts Other (See Comments)     Nasal stuffiness, coughing, sneezing       Changes to allergies: No    SPECIALTY MEDICATION ADHERENCE     Azathioprine 50 mg: 75 days of medicine on hand   Prednisone 5 mg: 21 days of medicine on hand   Valganciclovir 450 mg: 15 days of medicine on hand       Medication Adherence    Patient reported X missed doses in the last month: 0  Specialty Medication: Azathioprine 50mg   Patient is on additional specialty medications: Yes  Additional Specialty Medications: Prednisone 5mg   Patient Reported Additional Medication X Missed Doses in the Last Month: 0  Patient is on more than two specialty medications: Yes  Specialty Medication: Valganciclovir 450mg   Patient Reported Additional Medication X Missed Doses in the Last Month: 0          Specialty medication(s) dose(s) confirmed: Regimen is correct and unchanged.     Are there any concerns with adherence? No    Adherence counseling provided? Not needed    CLINICAL MANAGEMENT AND INTERVENTION      Clinical Benefit Assessment:    Do you feel the medicine is effective or helping your condition? Yes    Clinical Benefit counseling provided? Not needed    Adverse Effects Assessment:    Are you experiencing any side effects? No    Are you experiencing difficulty administering your medicine? No    Quality of Life Assessment:         How many days over the past month did your lung transplant  keep you from your normal activities? For example, brushing your teeth or getting up in the morning. 0    Have you discussed this with your provider? Not needed    Acute Infection Status:    Acute infections noted within Epic:  No active infections  Patient reported infection: None    Therapy Appropriateness:    Is therapy appropriate? Yes, therapy is appropriate and should be continued    DISEASE/MEDICATION-SPECIFIC INFORMATION      N/A    PATIENT SPECIFIC NEEDS     - Does the patient have any physical, cognitive, or cultural barriers? No    - Is the patient high risk? No    - Does the patient require a Care Management Plan? No     - Does the patient require physician intervention or other additional services (i.e. nutrition, smoking cessation, social work)? No      SHIPPING     Specialty Medication(s) to be Shipped:   Transplant: None- patient declined azathioprine, prednisone and valganciclovir    Other medication(s) to be shipped: azithromycin, calcium, mg, vit d,  pravastatin, terazosin     Changes to insurance: No    Delivery Scheduled: Yes, Expected medication delivery date: 12/05/20.     Medication will be delivered via UPS to the confirmed prescription address in [facility].    The patient will receive a drug information handout for each medication shipped and additional FDA Medication Guides as required.  Verified that patient has previously received a Conservation officer, historic buildings and a Surveyor, mining.    The patient or caregiver noted above participated in the development of this care plan and knows that they can request review of or adjustments to the care plan at any time.      All of the patient's questions and concerns have been addressed.    Tera Helper   [facility] Specialty Pharmacist

## 2020-12-03 ENCOUNTER — Encounter: Admit: 2020-12-03 | Discharge: 2020-12-04 | Payer: MEDICARE | Attending: Family | Primary: Family

## 2020-12-03 ENCOUNTER — Encounter: Admit: 2020-12-03 | Discharge: 2020-12-04 | Payer: MEDICARE

## 2020-12-03 ENCOUNTER — Ambulatory Visit: Admit: 2020-12-03 | Discharge: 2020-12-04 | Payer: MEDICARE

## 2020-12-03 DIAGNOSIS — Z794 Long term (current) use of insulin: Principal | ICD-10-CM

## 2020-12-03 DIAGNOSIS — E119 Type 2 diabetes mellitus without complications: Principal | ICD-10-CM

## 2020-12-03 DIAGNOSIS — K219 Gastro-esophageal reflux disease without esophagitis: Principal | ICD-10-CM

## 2020-12-03 DIAGNOSIS — Z942 Lung transplant status: Principal | ICD-10-CM

## 2020-12-03 MED ORDER — PANTOPRAZOLE 40 MG TABLET,DELAYED RELEASE
ORAL_TABLET | Freq: Every day | ORAL | 3 refills | 90.00000 days | Status: CP
Start: 2020-12-03 — End: ?

## 2020-12-03 MED ORDER — INSULIN GLARGINE (U-100) 100 UNIT/ML (3 ML) SUBCUTANEOUS PEN
Freq: Every evening | SUBCUTANEOUS | 3 refills | 125.00000 days | Status: CP
Start: 2020-12-03 — End: 2021-12-03
  Filled 2021-01-05: qty 15, 90d supply, fill #0

## 2020-12-03 MED ORDER — PREDNISONE 5 MG TABLET
ORAL_TABLET | Freq: Every day | ORAL | 11 refills | 30 days | Status: CP
Start: 2020-12-03 — End: ?
  Filled 2020-12-15: qty 60, 30d supply, fill #0

## 2020-12-03 MED ORDER — INSULIN ASPART (U-100) 100 UNIT/ML (3 ML) SUBCUTANEOUS PEN
2 refills | 0 days | Status: CP
Start: 2020-12-03 — End: ?

## 2020-12-03 NOTE — Unmapped (Signed)
Called Spencer Mallet Sr. to discuss lab results. Tacrolimus level was essentially normal at 9.3 with a goal of 7-9. Medication regimen is currently 1mg  BID. Dosing at this time to remain the same. Repeat labs scheduled monthly. Resulted labs reviewed. All questions answered. Pt verbalized understanding.

## 2020-12-03 NOTE — Unmapped (Signed)
[facility] CLINIC PHARMACY NOTE  12/03/2020   Spencer Pena Sr.  [id number]    Medication changes today:   1. Resume pravastatin (had continued taking)  2. Decrease pred to 10mg  daily  3. Decrease novolog to 7/7/3 + SSI  4. Decrease lantus to 12 units daily     Education/Adherence tools provided today:  1. provided updated medication list    Follow up items:  1. LFTs   2. A1c pending today  3. Montelukast for CLAD ppx in the future     Next visit with pharmacy in 1 month  ____________________________________________________________________    Spencer Mallet Sr. is a 70 y.o. 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     Other PMH significant for hypertension, T2DM, colon cancer s/p hemicolectomy and R hepatectomy for metastasis + chemotherapy (2007), BPH    Seen by pharmacy today for: medication management and  pill box assessment and adherence education.     CC:  Patient has no complaints today      Vitals:    12/03/20 0939   BP: 137/67   Pulse: 100   Temp: 37.1 ??C (98.8 ??F)   SpO2: 98%       Allergies   Allergen Reactions   ??? Cetuximab Anaphylaxis     Chest pain/rapid heart rate/ BP dropped   ??? Nsaids (Non-Steroidal Anti-Inflammatory Drug)      Interaction with transplant medications   ??? Venom-Honey Bee Anaphylaxis   ??? Enalapril      Angioedema     ??? Pollen Extracts Other (See Comments)     Nasal stuffiness, coughing, sneezing     Medications reviewed in EPIC medication station and updated today by the clinical pharmacist practitioner. Medication list includes revisions made during today???s encounter.    Outpatient Encounter Medications as of 12/03/2020   Medication Sig Dispense Refill   ??? acetaminophen (TYLENOL EXTRA STRENGTH) 500 MG tablet Take 1-2 tablets (500-1,000 mg total) by mouth every six (6) hours as needed for pain. 180 tablet PRN   ??? aspirin (ECOTRIN) 81 MG tablet Take 1 tablet (81 mg total) by mouth daily. 90 tablet 3   ??? azaTHIOprine (IMURAN) 50 mg tablet Take 1 tablet (50 mg total) by mouth daily. 90 tablet 3   ??? azithromycin (ZITHROMAX) 250 MG tablet Take 1 tablet (250 mg total) by mouth daily. 30 tablet 11   ??? blood sugar diagnostic (ON CALL EXPRESS TEST STRIP) Strp Test daily before all meals/snacks and once before bedtime. 100 each 11   ??? calcium carbonate (CALCIUM 600) 1,500 mg (600 mg elem calcium) tablet Take 1 tablet (600 mg of elem calcium total) by mouth Two (2) times a day. 60 tablet 11   ??? cholecalciferol, vitamin D3 25 mcg, 1,000 units,, 1,000 unit (25 mcg) tablet Take 2 tablets (50 mcg total) by mouth daily. 100 tablet 11   ??? fluticasone propion-salmeteroL (ADVAIR HFA) 115-21 mcg/actuation inhaler Inhale 2 puffs Two (2) times a day. 12 g 11   ??? insulin ASPART (NOVOLOG FLEXPEN U-100 INSULIN) 100 unit/mL (3 mL) injection pen Inject 7 units with breakfast (10 units with high sugar breakfast), 7 units with afternoon meal, and 3 units with evening snack plus sliding scale 2 units for every 50 >150; max 50 units/day 15 mL 2   ??? insulin glargine (BASAGLAR, LANTUS) 100 unit/mL (3 mL) injection pen Inject 0.12 mL (12 Units total) under the skin nightly. 15 mL 3   ??? lancets  30 gauge Misc Test daily before all meals/snacks and once before bedtime. 100 each 11   ??? magnesium oxide (MAG-OX) 400 mg (241.3 mg elemental magnesium) tablet Take 3 tablets (1,200 mg total) by mouth Two (2) times a day. 180 tablet 11   ??? metFORMIN (GLUCOPHAGE) 1000 MG tablet Take 1 tablet (1,000 mg total) by mouth Two (2) times a day. 180 tablet 3   ??? pantoprazole (PROTONIX) 40 MG tablet Take 1 tablet (40 mg total) by mouth in the morning. 90 tablet 3   ??? pen needle, diabetic (BD ULTRA-FINE NANO PEN NEEDLE) 32 gauge x 5/32 (4 mm) Ndle Use as directed for injections five (5) times a day. 150 each 11   ??? pravastatin (PRAVACHOL) 40 MG tablet Take 1 tablet (40 mg total) by mouth daily. 30 tablet 11   ??? predniSONE (DELTASONE) 5 MG tablet Take 2 tablets (10 mg total) by mouth in the morning. 60 tablet 11   ??? sulfamethoxazole-trimethoprim (BACTRIM) 400-80 mg per tablet Take 1 tablet (80 mg of trimethoprim total) by mouth Every Monday, Wednesday, and Friday. 36 tablet 3   ??? tacrolimus (PROGRAF) 1 MG capsule Take 1 capsule (1 mg total) by mouth two (2) times a day. 180 capsule 3   ??? terazosin (HYTRIN) 1 MG capsule Take 3 capsules (3 mg total) by mouth nightly. 90 capsule 11   ??? valGANciclovir (VALCYTE) 450 mg tablet Take 1 tablet (450 mg total) by mouth daily. 30 tablet 11   ??? [DISCONTINUED] insulin ASPART (NOVOLOG FLEXPEN U-100 INSULIN) 100 unit/mL (3 mL) injection pen Inject 9 units with breakfast (12 units with high sugar breakfast), 9 units with afternoon meal, and 4 units with evening snack plus sliding scale 2 units for every 50 >150; max 50 units/day 15 mL 2   ??? [DISCONTINUED] insulin glargine (BASAGLAR, LANTUS) 100 unit/mL (3 mL) injection pen Inject 0.15 mL (15 Units total) under the skin nightly. 15 mL 3   ??? [DISCONTINUED] pantoprazole (PROTONIX) 40 MG tablet Take 1 tablet (40 mg total) by mouth Two (2) times a day. 180 tablet 3   ??? [DISCONTINUED] predniSONE (DELTASONE) 5 MG tablet Take 3 tablets (15 mg total) by mouth daily. 90 tablet 11   ??? [DISCONTINUED] tacrolimus (PROGRAF) 0.5 MG capsule Take 1 capsule (0.5 mg total) by mouth nightly. Take with one 1 mg capsule in PM (2 mg AM /1.5 mg PM) 90 capsule 3     No facility-administered encounter medications on file as of 12/03/2020.       Immunosuppression:  Induction agent: basiliximab    Current immunosuppression:  ??? tacrolimus 1mg  BID  o Prograf goal: 7-9  ??? Azathioprine 50 mg daily - changed from MMF 2/2 diarrhea while inpatient; dose decreased from 100mg  to 75mg  on 08/01/20 given downtrending ANC and IgG; dose decrease to 50mg  on 10/03/20  ??? prednisone 15 mg daily - decreased from 20mg  on 09/05/20     Patient is tolerating immunosuppression well     IMMUNOSUPPRESSION DRUG LEVELS:  Lab Results   Component Value Date    Tacrolimus, Trough 8.5 11/03/2020 Tacrolimus, Trough 16.7 (H) 10/03/2020    Tacrolimus, Trough 6.0 09/05/2020    Tacrolimus, Timed 9.7 10/20/2020    Tacrolimus, Timed 11.6 10/14/2020    Tacrolimus, Timed 12.1 10/07/2020     Tacrolimus level is accurate 12 hour trough.    Graft function assessment via PFTs: improving  DSA: NTD - 10/03/20, pending today  Biopsies to date: none  WBC/ANC:  wnl  Plan: Will decrease prednisone to 10mg  daily and tac pending level     OI prophylaxis:   CMV Status: D+/ R-, high risk. CMV prophylaxis with valganciclovir 450 mg daily x 12 months per protocol.  Estimated Creatinine Clearance: 48.3 mL/min (A) (based on SCr of 1.33 mg/dL (H)).  PCP: Prophylaxis with bactrim SS 1 tab MWF indefinitely.  Fungal: Completed posaconazole 300 mg daily for prophylaxis x 3 months per protocol - COMPLETE (08/20/20)  Flu (seasonal): oseltamivir (Tamiflu) 75 mg every other day - COMPLETED (end of flu season)    Patient is tolerating infectious prophylaxis well.     Plan: Continue per protocol.     Infectious History:  Patient does not have a history of pathogenic infectious complications following lung transplant. 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 Previous antiinfective history use includes meropenem x7 days for broad spectrum gram negative and aspiration PNA coverage post-op per ICID.     Plan: Continue to monitor.    BOS prophylaxis:  Current meds include: azithromycin 250 mg daily and pantoprazole 40 mg daily, advair HFA BID  Esophageal motility study on 07/23/20 showed abnormal distal esophagus exposure during recumbent posture and overall borderline normal. 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 Normal findings with biopsy results pending. Demeester 16. Patient denies any reflux symptoms with every day dosing.     Plan: Continue current regimen. Will defer montelukast for now to reduce pill burden.     CV Prophylaxis:   Aspirin: asa 81 mg daily  The 10-year ASCVD risk score Denman George DC Jr., et al., 2013) is: 30.1%  Statin: pravastatin; currently on 40 mg daily - previously held for elevated LFTs     Plan: Continue current regimen    BP/Edema: Goal <140/90. Patient denies lower extremity edema and reports that BPH symptoms have resolved with increased terazosin dose.   Encounter vitals reported above.  Home BP ranges: 115-131/62-75 mmHg  Weights: stable  Current meds include: terazosin 3mg  at bedtime for BPH    Plan: BP and BPH within goal. Continue.     Anemia:  H/H:   Lab Results   Component Value Date    HGB 12.7 (L) 12/03/2020     Lab Results   Component Value Date    HCT 37.6 (L) 12/03/2020     Iron panel:  Lab Results   Component Value Date    IRON 52 (L) 08/01/2020    TIBC 304 08/01/2020    FERRITIN 607.8 (H) 08/01/2020     Lab Results   Component Value Date    Iron Saturation (%) 17 08/01/2020     Prior anemia therapy: IV Ferrlecit (1/10-1/13)    Plan: H/H out of goal but stable. Consider repeating iron studies. Patient with documented deficiency in March however insurance will not cover IV iron until lower threshlold is met.      DM:   Lab Results   Component Value Date    A1C 5.1 12/03/2020   Goal A1c < 7%.  Current meds include:  ?? Insulin Glargine 15 units daily in AM  ?? Novolog SSI 2:50>150  ?? Novolog units 9/9/4  ?? Metformin 1000 mg PO BID  ?? HOLDING home glipizide (took pre-transplant)    Home BS log: Fasting: 86-100, Pre-3pm meal: 75-186 (isolated 75 due to eating light breakfast), Pre-evening snack: 122-233. No hypoglycemia. Rule of 15 reviewed.     Exercise: walking daily  Hypoglycemia: no  Plan: decrease lantus to 12  units and novolog to 7/7/3 given predinsone decrease. instrcuted pt to call if any BG <60 or >2 BG >300    Electrolytes: wnl  Current meds include: MgOx 1200 mg BID    Plan: continue.     Bone health:  Patient currently on long-term steroid therapy.  Vitamin D Level: 51.7 (3/22). Goal > 30.   Last DEXA results (02/2020 pre-transplant): normal bone density  Current meds include: vitamin D3 2000 units daily, calcium carbonate 600 mg BID    Plan: continue     Women's/Men's Health:  Spencer Pena Sr. is a 70 y.o. male. Patient reports history of BPH. Reports that BPH symptoms are controlled.  Current meds include: terazosin 3 mg PO nightly (increased 09/05/20 given increased BPH symptoms)     Plan: Continue current dose and BP monitoring.     Adherence:   Patient has good understanding of medications.  Patient does not fill their own pill box on a regular basis at home. Wife currently fills however patient watches her fill the box and she reports that he is learning his medications better.   Patient brought medication card: yes  Pill box: did not bring  Patient requested refills for the following meds: None  Corrections needed in Epic medication list: pantoprazole SIG to daily     Plan: Provided moderate adherence counseling/intervention    I spent a total of 20 minutes face to face with the patient delivering clinical care and providing education/counseling.    Patient was reviewed with Dr. Dudley Major who was agreement with the stated plan.    During this visit, the following was completed:   BG log data assessment  BP log data assessment  Labs ordered and evaluated  complex treatment plan >1 DS   Patient education was completed for 25-60 minutes     All questions/concerns were addressed to the patient's satisfaction.  __________________________________________  PATIENT SEEN AND EVALUATED BY:  Idora Brosious TEETER Amely Voorheis, PHARMD, BCPS, CPP  [employer] TRANSPLANT PHARMACIST PRACTITIONER  PAGER [phone]

## 2020-12-03 NOTE — Unmapped (Signed)
See Pharmacy encounter in clinic this day for additional documentation.

## 2020-12-03 NOTE — Unmapped (Signed)
Pt ID verified with Name and Date of birth. The EUA Covid-19 Fact Sheet given to patient. All screening questions answered. Verbal consent taken. Vaccine administered as ordered.  See immunization history for documentation.  Pt tolerated well with no issues noted. Recommend that patient stay in observation area for 15 minutes.

## 2020-12-03 NOTE — Unmapped (Signed)
LUNG TRANSPLANT SOCIAL WORK NOTE      Met with Spencer Pena to check in with him on how things are going, now that he is about 6 months post-transplant.    Spencer Pena says that he is staying very busy, and that's good. He says he can't crawl under the house anymore, but he's been busy outside cutting trees and doing yard work, and he is getting some work done on his house. He says he still wakes up some mornings at 3 a.m. like he used to for work, but he's mostly able to fall back asleep.    Spencer Pena seems to be in good spirits. Asked him to complete mental health screenings, which did not reveal any concerns:    GAD-7 Score:  GAD-7 Total Score: 0    PHQ-9 Score:  PHQ-9 TOTAL SCORE: 1    The only issue aside from early awakening he mentioned is that he doesn't yet have the amount of energy he wishes to have. Emotional support offered.    [name], LCSW, CCTSW  Transplant Case Manager  December 03, 2020 2:08 PM

## 2020-12-04 MED FILL — PRAVASTATIN 40 MG TABLET: ORAL | 30 days supply | Qty: 30 | Fill #2

## 2020-12-04 MED FILL — TERAZOSIN 1 MG CAPSULE: ORAL | 30 days supply | Qty: 90 | Fill #3

## 2020-12-04 MED FILL — MAGNESIUM OXIDE 400 MG (241.3 MG MAGNESIUM) TABLET: ORAL | 30 days supply | Qty: 180 | Fill #2

## 2020-12-04 MED FILL — CALCIUM CARBONATE 600 MG CALCIUM (1,500 MG) TABLET: ORAL | 30 days supply | Qty: 60 | Fill #6

## 2020-12-04 MED FILL — AZITHROMYCIN 250 MG TABLET: ORAL | 30 days supply | Qty: 30 | Fill #6

## 2020-12-04 MED FILL — CHOLECALCIFEROL (VITAMIN D3) 25 MCG (1,000 UNIT) TABLET: ORAL | 50 days supply | Qty: 100 | Fill #3

## 2020-12-07 NOTE — Unmapped (Signed)
Addended by: Sela Hua on: 12/07/2020 06:30 AM     Modules accepted: Orders

## 2020-12-09 LAB — HLA DS POST TRANSPLANT
ANTI-DONOR DRW #1 MFI: 0 MFI
ANTI-DONOR DRW #2 MFI: 0 MFI
ANTI-DONOR HLA-A #1 MFI: 0 MFI
ANTI-DONOR HLA-A #2 MFI: 0 MFI
ANTI-DONOR HLA-B #1 MFI: 0 MFI
ANTI-DONOR HLA-B #2 MFI: 0 MFI
ANTI-DONOR HLA-C #1 MFI: 0 MFI
ANTI-DONOR HLA-C #2 MFI: 0 MFI
ANTI-DONOR HLA-DQB #1 MFI: 0 MFI
ANTI-DONOR HLA-DQB #2 MFI: 0 MFI
ANTI-DONOR HLA-DR #1 MFI: 40 MFI
ANTI-DONOR HLA-DR #2 MFI: 0 MFI

## 2020-12-09 LAB — FSAB CLASS 2 ANTIBODY SPECIFICITY
CLASS 2 ANTIBODIES IDENTIFIED: 1:05 {titer}
HLA CL2 AB RESULT: POSITIVE

## 2020-12-09 LAB — FSAB CLASS 1 ANTIBODY SPECIFICITY: HLA CLASS 1 ANTIBODY RESULT: NEGATIVE

## 2020-12-12 NOTE — Unmapped (Signed)
[facility] Refill Coordination Note    Specialty Medication(s) to be Shipped:   Transplant: valgancyclovir 450mg  and Prednisone 5mg     Other medication(s) to be shipped: advair, pen needles, test strips, lancets     KALIL WOESSNER Sr., DOB: 05-12-1950  Phone: [phone] (home) [phone] (work)      All above HIPAA information was verified with patient.     Was a Nurse, learning disability used for this call? No    Completed refill call assessment today to schedule patient's medication shipment from the [facility] [phone]).  All relevant notes have been reviewed.     Specialty medication(s) and dose(s) confirmed: Regimen is correct and unchanged.   Changes to medications: Eural reports no changes at this time.  Changes to insurance: No  New side effects reported not previously addressed with a pharmacist or physician: None reported  Questions for the pharmacist: No    Confirmed patient received a Conservation officer, historic buildings and a Surveyor, mining with first shipment. The patient will receive a drug information handout for each medication shipped and additional FDA Medication Guides as required.       DISEASE/MEDICATION-SPECIFIC INFORMATION        N/A    SPECIALTY MEDICATION ADHERENCE     Medication Adherence    Patient reported X missed doses in the last month: 0  Specialty Medication: Prednisone 5mg   Patient is on additional specialty medications: Yes  Additional Specialty Medications: Valganciclovir 450mg   Patient Reported Additional Medication X Missed Doses in the Last Month: 0  Patient is on more than two specialty medications: No        valgancyclovir 450mg    8 days worth of medication on hand.  Prednisone 5mg   8 days worth of medication on hand.        Were doses missed due to medication being on hold? No      REFERRAL TO PHARMACIST     Referral to the pharmacist: Not needed      [facility]     Shipping address confirmed in Epic.     Delivery Scheduled: Yes, Expected medication delivery date: 12/16/20.     Medication will be delivered via UPS to the prescription address in Epic WAM.    [place] A Nyiah Pianka   [facility] Specialty Technician

## 2020-12-13 NOTE — Unmapped (Signed)
Abstraction Result Flowsheet Data    This patient's last AWV date: [facility] Last Medicare Wellness Visit Date: Not Found  This patients last WCC/CPE date: : 07/23/2019      Reason for Encounter  Reason for Encounter: Outreach  Primary Reason for Outreach: AWV  Outreach Call Outcome: Left message  Text Message: No

## 2020-12-15 MED FILL — ADVAIR HFA 115 MCG-21 MCG/ACTUATION AEROSOL INHALER: RESPIRATORY_TRACT | 30 days supply | Qty: 12 | Fill #2

## 2020-12-15 MED FILL — ON CALL EXPRESS TEST STRIP: 20 days supply | Qty: 100 | Fill #5

## 2020-12-15 MED FILL — ON CALL LANCET 30 GAUGE: 20 days supply | Qty: 100 | Fill #5

## 2020-12-15 MED FILL — BD ULTRA-FINE NANO PEN NEEDLE 32 GAUGE X 5/32" (4 MM): SUBCUTANEOUS | 40 days supply | Qty: 200 | Fill #3

## 2020-12-15 MED FILL — VALGANCICLOVIR 450 MG TABLET: ORAL | 30 days supply | Qty: 30 | Fill #5

## 2020-12-22 NOTE — Unmapped (Signed)
Pulmonary Transplant Clinic    HISTORY:   HPI:  70 y.o. 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, also with type 2 diabetes mellitus, hypertension, colon cancer in 2007 s/p partial colon and hepatic wedge resection and FOLFOX here for transplant complications    - Feeling great overall  - Denies cough, SOB, sputum production, fevers, chills  - Doesn't have a home PFT machine  - Denies reflux/heartburn  - Denies LE edema  - Home BPs stable  - Blood sugars a little high, trying to limit ice cream/candies  - No hypoglycemia  - Had sleep study and needs CPAP  - Exercising working in the yard, no formal gym activity      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     --> basiliximab induction    --> CMV high risk (D+/R-)    --> EBV moderate risk (D+/R+)    --> Not Increased Risk Donor  - Colon Cancer 2007    --> s/p resection of liver mets    --> s/p resection of 6 inches of colon    --> FOLFOX Chemo  - type 2 diabetes mellitus     --> not insulin dependent  - OA    SOCIAL HISTORY:   - 20pk years, quit in 1991  - drinks about 10 drinks a week but will stop  - Works maintenance at [employer]  - Married and lives in [place]    FAMILY HISTORY:   - Parents lived into late 90's    MEDS: (personally reviewed in EPIC, pertinent meds noted below)         OBJECTIVE DATA:   PHYSICAL EXAM:  BP 128/67 (BP Site: R Arm, BP Position: Sitting, BP Cuff Size: Medium)  - Pulse 85  - Temp 36.6 ??C (97.9 ??F) (Tympanic)  - Ht 170.2 cm (5' 7.01)  - Wt 75.2 kg (165 lb 11.2 oz) Comment: with items in pocket - SpO2 98%  - BMI 25.95 kg/m??      Gen: - awake, alert, in NAD  Derm: - well-healing surgical scars s/p BOLT, right sided chest tube sites healing very well, left sided chest tube sites healing well but remain scarred over  Vascular: - good pulses throughout,   CV: - RRR, no m/r/g  Pulm: - Normal symmetric excursion, Normal WOB on RA. Good air movement bilaterally, without wheezes or crackles including with forced expiraiton. Normal upper airway sounds without evidence of stridor  Abd: - soft, NT, ND, +BS, no hepatosplenomegally  Ext: - no edema of LE, no clubbing  Skin: mild redness over anterior chest, no warmth or TTP   Neuro: - Alert, oriented, follows commands. Moving all extremities spontaneously    LABS: (reviewed in Epic, pertinent values noted below)     PFTs:  (personally reveiewed and interpreted):    Date: FVC (% Pred) FEV1 (% Pred) FEF25-75(% Pred) DLCO TBBx/Results                                                           12/31/20 3.95 (107.1%) 3.46 (122.6%) 5.20 (236.4%)     12/03/2020 3.87 (104.9%) 3.47 (122.9%) 5.69 (258.3%)     10/03/2020 3.68 (99.4) 3.13 (110.6)  4.22 (190.6)      09/05/20  3.70  3.14      08/20/20 3.65 (98%) 3.15 (111%) 4.69 (211%)      08/01/20 3.64 (98.2%) 3.10 (109.3%) 4.41 (198.8%)     07/18/20 3.49 (94%) 2.98 (105%) 4.20 (189%)     07/04/20 3.43 (92.5%) 2.97 (104.7%) 4.26 (191.7%)     06/27/2020 3.13 84.5 2.70 95.4 3.51 157.8     06/20/20 3.39 (91.4%) 2.82 (99.2%) 3.34 (150.35)     06/13/20 3.10 (72.8%) 2.66 (82.5%) 3.34 (136.2%)      05/15/20      BOLT    03/11/20  2.75 (72%)  2.36 (81%)       02/12/20  2.85 (75%)  2.44 (86%)                IMAGING: (Personally reviewed, our interpretation is below)  CXR 08/20/20: stable blunting of R costophrenic angle with elevated R hemidiaphragm. 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 consider envarsus  - Imuran 75 mg daily; dropped from 100 mg 08/01/20 given good PFT's, age, renal fx and total IgG. Dropped to 50 mg daily given renal function and low IgG.    --> Stopped MMF 06/13/20 (GI upset).    - Prednisone 15mg  daily (decreased last visit), decreased to 10mg  at 6mos out with excellent PFTs  - Azithro BOS ppx, aspirin 81 mg daily  - Restarted prava 40mg  QD, has elevated LFTs again, will continue at 40 mg  - Start Advair today (not on Montelukast, but on Azithro)    Antimicrobial Prophylaxis:  - CMV, high risk (D+/R-): valganciclovir 450mg  daily for Estimated Creatinine Clearance: 42.8 mL/min (A) (based on SCr of 1.5 mg/dL (H)). Negative 06/20/20  - EBV, (D+/R+): negative 06/20/20  - Fungal: Posaconazole stopped at 72mo  - PCP: Bactrim SS MWF   - tamiflu during flu season, stopped     Pain control:  - Moderately well controlled  - Previously on gabapentin 100mg  TID, now off  - Tylenol TID prn    Concern for OSA: STOP-BANG 4 points (male, age, observed apnea, snoring)  - will order sleep study (has never had one)    Loose stools: resolved 2 to 3 weeks ago  - Improved with switch from MMF to imuran  - Encouraged him to avoid sugar free foods as this exacerbates symptoms.  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  - pH probe with DeMeester 16.7%, slightly in abnormal range but will hold off on surgical intervention at this time in the setting of normal graft function.    --> On daily  PPI    BPH  - Increased terazosin to 2mg  nightly to help symptoms and BP, and symptoms have resolved... increase to 4mg  as posa washing out     Blood Pressure Management:  - Home BPs stable  - Not currently on therapy    Kidney Function:  - Cr baseline 0.8-1.13  - Cr continues rising  - Lasix on hold, no increase in LE edema    Iron Deficiency Anemia:   - received IV iron 05/22/20, continue to be low    Hypomagnesemia: Continue to be low  - Increase 800 mg BID to 1200 mg BID today  ??  Antiarrythmic Prophylaxis:   - finished amiodarone without arrhythmia.     Type 2 diabetes mellitus   - Lantus will drop to 12 units in the nightly with pred adjustment  - aspart 7/7/3 with pred adjustment  - metformin 1000mg  BID  - sugars well controlled, apart from some lower morning sugars (42-595)    Bone Health: Last Dexa: Oct 2021, no osteopenia or osteoporosis  - on Ca and Vit D supplementation currently, last Vit D 27 on 05/24/20    Health Maintenance:  - Last derm visit:  Pre-txp, discussed today   - Last Colonsocopy (if >40): 12/2016 Diverticulosis and internal hemorrhoids  - Last flu shot:   02/08/2020  - Last Pneumovax:  06/10/2004, 12/30/2017  - Last Prevnar:  03/25/2016  - TdaP:   02/11/2018  - Shingrix:    03/10/20, 08/20/20  - COVID-19 (Moderna) 06/11/19, 07/09/19, 03/10/20, 07/18/2020, 12/03/20 received 1/2 dose Evushield 06/20/20, and additional dose 08/01/20.  Will need redose of evusheld on 01/2021  - Last Dexa:   02/2020, will need DEXA at 57yr  - Last vitamin D:  51.2 (07/18/2020)  - Last HbA1c:   6.0 (06/13/2020), repeat pending.    Immunization History   Administered Date(s) Administered   ??? COVID-19 VACCINE,MRNA(MODERNA)(PF)(IM) 06/11/2019, 07/09/2019, 03/10/2020, 07/18/2020, 12/03/2020   ??? HEPB-CPG,ADULT DOSAGE ADJUVANTED, IM 03/12/2020   ??? INFLUENZA INJ MDCK PF, QUAD,(FLUCELVAX)(20MO AND UP EGG FREE) 02/08/2020   ??? INFLUENZA TIV (TRI) PF (IM) 02/27/2010   ??? Influenza Vaccine Quad (IIV4 PF) 68mo+ injectable 01/19/2019   ??? Influenza Virus Vaccine, unspecified formulation 02/07/2013, 02/28/2015, 02/05/2016, 02/02/2017, 01/27/2018   ??? PNEUMOCOCCAL POLYSACCHARIDE 23 06/10/2004, 12/30/2017   ??? Pneumococcal Conjugate 13-Valent 03/25/2016   ??? SHINGRIX-ZOSTER VACCINE (HZV), RECOMBINANT,SUB-UNIT,ADJUVANTED IM 03/10/2020, 08/20/2020   ??? TdaP 02/15/2012, 02/11/2018       >20min was spent with the patient face to face and >68min was spent reviewing chart/imaging.     Complex medical decision making was done as we adjust medications based on blood work/drug levels and multiple complaints and problems were addressed    The patient was assessed and discussed with Dr. Glenard Haring who is in agreement with plan.    Sharee Pimple MSN, FNP-C  December 31, 2020 11:49 AM  Lung Transplant Nurse Practitioner  Pager [phone]

## 2020-12-24 DIAGNOSIS — Z942 Lung transplant status: Principal | ICD-10-CM

## 2020-12-28 ENCOUNTER — Ambulatory Visit: Admit: 2020-12-28 | Discharge: 2020-12-30 | Payer: MEDICARE

## 2020-12-31 ENCOUNTER — Ambulatory Visit: Admit: 2020-12-31 | Discharge: 2021-01-01 | Payer: MEDICARE

## 2020-12-31 ENCOUNTER — Encounter: Admit: 2020-12-31 | Discharge: 2021-01-01 | Payer: MEDICARE

## 2020-12-31 ENCOUNTER — Ambulatory Visit: Admit: 2020-12-31 | Discharge: 2021-01-01 | Payer: MEDICARE | Attending: Family | Primary: Family

## 2020-12-31 DIAGNOSIS — Z48298 Encounter for aftercare following other organ transplant: Principal | ICD-10-CM

## 2020-12-31 DIAGNOSIS — Z942 Lung transplant status: Principal | ICD-10-CM

## 2020-12-31 LAB — COMPREHENSIVE METABOLIC PANEL
ALBUMIN: 3.8 g/dL (ref 3.4–5.0)
ALKALINE PHOSPHATASE: 92 U/L (ref 46–116)
ALT (SGPT): 30 U/L (ref 10–49)
ANION GAP: 3 mmol/L — ABNORMAL LOW (ref 5–14)
AST (SGOT): 35 U/L — ABNORMAL HIGH (ref <=34)
BILIRUBIN TOTAL: 0.2 mg/dL — ABNORMAL LOW (ref 0.3–1.2)
BLOOD UREA NITROGEN: 34 mg/dL — ABNORMAL HIGH (ref 9–23)
BUN / CREAT RATIO: 23
CALCIUM: 9.5 mg/dL (ref 8.7–10.4)
CHLORIDE: 105 mmol/L (ref 98–107)
CO2: 32 mmol/L — ABNORMAL HIGH (ref 20.0–31.0)
CREATININE: 1.5 mg/dL — ABNORMAL HIGH
EGFR CKD-EPI (2021) MALE: 50 mL/min/{1.73_m2} — ABNORMAL LOW (ref >=60)
GLUCOSE RANDOM: 108 mg/dL — ABNORMAL HIGH (ref 70–99)
POTASSIUM: 4.3 mmol/L (ref 3.4–4.8)
PROTEIN TOTAL: 6.2 g/dL (ref 5.7–8.2)
SODIUM: 140 mmol/L (ref 135–145)

## 2020-12-31 LAB — CBC W/ AUTO DIFF
BASOPHILS ABSOLUTE COUNT: 0 10*9/L (ref 0.0–0.1)
BASOPHILS RELATIVE PERCENT: 0.9 %
EOSINOPHILS ABSOLUTE COUNT: 0 10*9/L (ref 0.0–0.5)
EOSINOPHILS RELATIVE PERCENT: 1.3 %
HEMATOCRIT: 37.4 % — ABNORMAL LOW (ref 39.0–48.0)
HEMOGLOBIN: 12.8 g/dL — ABNORMAL LOW (ref 12.9–16.5)
LYMPHOCYTES ABSOLUTE COUNT: 0.6 10*9/L — ABNORMAL LOW (ref 1.1–3.6)
LYMPHOCYTES RELATIVE PERCENT: 16.6 %
MEAN CORPUSCULAR HEMOGLOBIN CONC: 34.3 g/dL (ref 32.0–36.0)
MEAN CORPUSCULAR HEMOGLOBIN: 32.1 pg (ref 25.9–32.4)
MEAN CORPUSCULAR VOLUME: 93.6 fL (ref 77.6–95.7)
MEAN PLATELET VOLUME: 7.4 fL (ref 6.8–10.7)
MONOCYTES ABSOLUTE COUNT: 0.3 10*9/L (ref 0.3–0.8)
MONOCYTES RELATIVE PERCENT: 7.5 %
NEUTROPHILS ABSOLUTE COUNT: 2.6 10*9/L (ref 1.8–7.8)
NEUTROPHILS RELATIVE PERCENT: 73.7 %
PLATELET COUNT: 135 10*9/L — ABNORMAL LOW (ref 150–450)
RED BLOOD CELL COUNT: 4 10*12/L — ABNORMAL LOW (ref 4.26–5.60)
RED CELL DISTRIBUTION WIDTH: 14.8 % (ref 12.2–15.2)
WBC ADJUSTED: 3.5 10*9/L — ABNORMAL LOW (ref 3.6–11.2)

## 2020-12-31 LAB — CMV DNA, QUANTITATIVE, PCR: CMV VIRAL LD: NOT DETECTED

## 2020-12-31 LAB — SLIDE REVIEW

## 2020-12-31 LAB — TACROLIMUS LEVEL, TROUGH: TACROLIMUS, TROUGH: 7.1 ng/mL (ref 5.0–15.0)

## 2020-12-31 LAB — MAGNESIUM: MAGNESIUM: 1.7 mg/dL (ref 1.6–2.6)

## 2020-12-31 LAB — PHOSPHORUS: PHOSPHORUS: 2.8 mg/dL (ref 2.4–5.1)

## 2020-12-31 LAB — IGG: GAMMAGLOBULIN; IGG: 513 mg/dL — ABNORMAL LOW (ref 646–2013)

## 2020-12-31 NOTE — Unmapped (Signed)
Called [name] to discuss lab results. Tacrolimus level was normal at 7.1 with a goal of 7-9. Medication regimen is currently 1 mg BID. Discussed with Dosing at this time to remain the same. Repeat labs scheduled monthly. Resulted labs reviewed. All questions answered. Pt verbalized understanding.

## 2021-01-01 NOTE — Unmapped (Signed)
[facility] CLINIC PHARMACY NOTE  12/31/2020   Spencer BLANSETT Pena.  [id number]    Medication changes today:   1. None     Education/Adherence tools provided today:  1. provided updated medication list    Follow up items:  1. Montelukast for CLAD ppx in the future     Next visit with pharmacy in 1 month  ____________________________________________________________________    Spencer Pena. is a 70 y.o. 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     Other PMH significant for hypertension, T2DM, colon cancer s/p hemicolectomy and R hepatectomy for metastasis + chemotherapy (2007), BPH    Seen by pharmacy today for: medication management and  pill box assessment and adherence education.     CC:  Patient has no complaints today      Vitals:    12/31/20 0923   BP: 128/67   Pulse: 85   SpO2: 98%       Allergies   Allergen Reactions   ??? Cetuximab Anaphylaxis     Chest pain/rapid heart rate/ BP dropped   ??? Nsaids (Non-Steroidal Anti-Inflammatory Drug)      Interaction with transplant medications   ??? Venom-Honey Bee Anaphylaxis   ??? Enalapril      Angioedema     ??? Pollen Extracts Other (See Comments)     Nasal stuffiness, coughing, sneezing     Medications reviewed in EPIC medication station and updated today by the clinical pharmacist practitioner. Medication list includes revisions made during today???s encounter.    Outpatient Encounter Medications as of 12/31/2020   Medication Sig Dispense Refill   ??? acetaminophen (TYLENOL EXTRA STRENGTH) 500 MG tablet Take 1-2 tablets (500-1,000 mg total) by mouth every six (6) hours as needed for pain. 180 tablet PRN   ??? aspirin (ECOTRIN) 81 MG tablet Take 1 tablet (81 mg total) by mouth daily. 90 tablet 3   ??? azaTHIOprine (IMURAN) 50 mg tablet Take 1 tablet (50 mg total) by mouth daily. 90 tablet 3   ??? azithromycin (ZITHROMAX) 250 MG tablet Take 1 tablet (250 mg total) by mouth daily. 30 tablet 11   ??? blood sugar diagnostic (ON CALL EXPRESS TEST STRIP) Strp Test daily before all meals/snacks and once before bedtime. 100 each 11   ??? calcium carbonate (CALCIUM 600) 1,500 mg (600 mg elem calcium) tablet Take 1 tablet (600 mg of elem calcium total) by mouth Two (2) times a day. 60 tablet 11   ??? cholecalciferol, vitamin D3 25 mcg, 1,000 units,, 1,000 unit (25 mcg) tablet Take 2 tablets (50 mcg total) by mouth daily. 100 tablet 11   ??? fluticasone propion-salmeteroL (ADVAIR HFA) 115-21 mcg/actuation inhaler Inhale 2 puffs Two (2) times a day. 12 g 11   ??? insulin ASPART (NOVOLOG FLEXPEN U-100 INSULIN) 100 unit/mL (3 mL) injection pen Inject 7 units with breakfast (10 units with high sugar breakfast), 7 units with afternoon meal, and 3 units with evening snack plus sliding scale 2 units for every 50 >150; max 50 units/day 15 mL 2   ??? insulin glargine (BASAGLAR, LANTUS) 100 unit/mL (3 mL) injection pen Inject 0.12 mL (12 Units total) under the skin nightly. 15 mL 3   ??? lancets 30 gauge Misc Test daily before all meals/snacks and once before bedtime. 100 each 11   ??? magnesium oxide (MAG-OX) 400 mg (241.3 mg elemental magnesium) tablet Take 3 tablets (1,200 mg total) by mouth Two (2) times a day. 180 tablet  11   ??? metFORMIN (GLUCOPHAGE) 1000 MG tablet Take 1 tablet (1,000 mg total) by mouth Two (2) times a day. 180 tablet 3   ??? pantoprazole (PROTONIX) 40 MG tablet Take 1 tablet (40 mg total) by mouth in the morning. 90 tablet 3   ??? pen needle, diabetic (BD ULTRA-FINE NANO PEN NEEDLE) 32 gauge x 5/32 (4 mm) Ndle Use as directed for injections five (5) times a day. 150 each 11   ??? pravastatin (PRAVACHOL) 40 MG tablet Take 1 tablet (40 mg total) by mouth daily. 30 tablet 11   ??? predniSONE (DELTASONE) 5 MG tablet Take 2 tablets (10 mg total) by mouth in the morning. 60 tablet 11   ??? sulfamethoxazole-trimethoprim (BACTRIM) 400-80 mg per tablet Take 1 tablet (80 mg of trimethoprim total) by mouth Every Monday, Wednesday, and Friday. 36 tablet 3   ??? tacrolimus (PROGRAF) 1 MG capsule Take 1 capsule (1 mg total) by mouth two (2) times a day. 180 capsule 3   ??? terazosin (HYTRIN) 1 MG capsule Take 3 capsules (3 mg total) by mouth nightly. 90 capsule 11   ??? valGANciclovir (VALCYTE) 450 mg tablet Take 1 tablet (450 mg total) by mouth daily. 30 tablet 11     No facility-administered encounter medications on file as of 12/31/2020.       Immunosuppression:  Induction agent: basiliximab    Current immunosuppression:  ??? tacrolimus 1mg  BID  o Prograf goal: 7-9  ??? Azathioprine 50 mg daily - changed from MMF 2/2 diarrhea while inpatient; dose decreased from 100mg  to 75mg  on 08/01/20 given downtrending ANC and IgG; dose decrease to 50mg  on 10/03/20  ??? prednisone 10 mg daily    Patient is tolerating immunosuppression well     IMMUNOSUPPRESSION DRUG LEVELS:  Lab Results   Component Value Date    Tacrolimus, Trough 7.1 12/31/2020    Tacrolimus, Trough 9.3 12/03/2020    Tacrolimus, Trough 8.5 11/03/2020     Tacrolimus level is accurate 12 hour trough.    Graft function assessment via PFTs: improving  DSA: NTD - 10/03/20, pending today  Biopsies to date: none  WBC/ANC:  wnl      Plan: Will maintain current immunosuppression pending tac level     OI prophylaxis:   CMV Status: D+/ R-, high risk. CMV prophylaxis with valganciclovir 450 mg daily x 12 months per protocol.  Estimated Creatinine Clearance: 42.8 mL/min (A) (based on SCr of 1.5 mg/dL (H)).  PCP: Prophylaxis with bactrim SS 1 tab MWF indefinitely.  Fungal: Completed posaconazole 300 mg daily for prophylaxis x 3 months per protocol - COMPLETE (08/20/20)  Flu (seasonal): oseltamivir (Tamiflu) 75 mg every other day - COMPLETED (end of flu season)    Patient is tolerating infectious prophylaxis well.     Plan: Continue per protocol.     Infectious History:  Patient does not have a history of pathogenic infectious complications following lung transplant. 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 Previous antiinfective history use includes meropenem x7 days for broad spectrum gram negative and aspiration PNA coverage post-op per ICID.     Plan: Continue to monitor.    BOS prophylaxis:  Current meds include: azithromycin 250 mg daily and pantoprazole 40 mg daily, advair HFA BID  Esophageal motility study on 07/23/20 showed abnormal distal esophagus exposure during recumbent posture and overall borderline normal. 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 Normal findings with biopsy results pending. Demeester 16. Patient denies any reflux  symptoms with every day dosing.     Plan: Continue current regimen.     CV Prophylaxis:   Aspirin: asa 81 mg daily  The 10-year ASCVD risk score Denman George DC Jr., et al., 2013) is: 27.2%  Statin: pravastatin; currently on 40 mg daily - previously held for elevated LFTs     Plan: Continue current regimen    BP/Edema: Goal <140/90.   Encounter vitals reported above.  Home BP ranges: 120s/70s mmHg  Weights: stable  Current meds include: terazosin 3mg  at bedtime for BPH    Plan: BP and BPH within goal. Continue.     Anemia:  H/H:   Lab Results   Component Value Date    HGB 12.8 (L) 12/31/2020     Lab Results   Component Value Date    HCT 37.4 (L) 12/31/2020     Iron panel:  Lab Results   Component Value Date    IRON 52 (L) 08/01/2020    TIBC 304 08/01/2020    FERRITIN 607.8 (H) 08/01/2020     Lab Results   Component Value Date    Iron Saturation (%) 17 08/01/2020     Prior anemia therapy: IV Ferrlecit (1/10-1/13)    Plan: H/H within goal       DM:   Lab Results   Component Value Date    A1C 5.1 12/03/2020   Goal A1c < 7%.  Current meds include:  ?? Insulin Glargine 12 units daily in AM  ?? Novolog SSI 2:50>150  ?? Novolog units 7/7/3  ?? Metformin 1000 mg PO BID  ?? HOLDING home glipizide (took pre-transplant)    Home BS log: [place] within goal consistently; pre-prandial at lunch and dinner WNL save excursions for heavy carb meals and ice cream/sweets jar  Exercise: walking daily  Hypoglycemia: no  Plan: continue current reigmen, he will work on his diet to have BG >200 due to sweets (ice cream, etc) no more than 2x/week     Electrolytes: wnl  Current meds include: MgOx 1200 mg BID    Plan: continue    Bone health:  Patient currently on long-term steroid therapy.  Vitamin D Level: 51.7 (3/22). Goal > 30.   Last DEXA results (02/2020 pre-transplant): normal bone density  Current meds include: vitamin D3 2000 units daily, calcium carbonate 600 mg BID    Plan: continue     Women's/Men's Health:  STRYKER VEASEY Pena. is a 70 y.o. male. Patient reports history of BPH. Reports that BPH symptoms are controlled.  Current meds include: terazosin 3 mg PO nightly (increased 09/05/20 given increased BPH symptoms)     Plan: Continue current dose and BP monitoring.     Adherence:   Patient has good understanding of medications.  Patient does not fill their own pill box on a regular basis at home. Wife currently fills however patient watches her fill the box and she reports that he is learning his medications better.   Patient brought medication card: yes  Pill box: was correct  Patient requested refills for the following meds: None  Corrections needed in Epic medication list: pantoprazole SIG to daily     Plan: Provided basic adherence counseling/intervention    I spent a total of 20 minutes face to face with the patient delivering clinical care and providing education/counseling.    Patient was reviewed with Dr.Coakley who was agreement with the stated plan.    During this visit, the following was completed:   BG log data  assessment  BP log data assessment  Labs ordered and evaluated  complex treatment plan >1 DS   Patient education was completed for 25-60 minutes     All questions/concerns were addressed to the patient's satisfaction.  __________________________________________  PATIENT SEEN AND EVALUATED BY:  Aarav Burgett TEETER Suede Greenawalt, PHARMD, BCPS, CPP  [employer]ID [employer] TRANSPLANT PHARMACIST PRACTITIONER  PAGER [phone]

## 2021-01-02 LAB — EBV QUANTITATIVE PCR, BLOOD: EBV VIRAL LOAD RESULT: NOT DETECTED

## 2021-01-05 LAB — HLA DS POST TRANSPLANT
ANTI-DONOR DRW #1 MFI: 0 MFI
ANTI-DONOR DRW #2 MFI: 0 MFI
ANTI-DONOR HLA-A #1 MFI: 0 MFI
ANTI-DONOR HLA-A #2 MFI: 0 MFI
ANTI-DONOR HLA-B #1 MFI: 0 MFI
ANTI-DONOR HLA-B #2 MFI: 0 MFI
ANTI-DONOR HLA-C #1 MFI: 0 MFI
ANTI-DONOR HLA-C #2 MFI: 0 MFI
ANTI-DONOR HLA-DP AG #1 MFI: 0 MFI
ANTI-DONOR HLA-DQB #1 MFI: 0 MFI
ANTI-DONOR HLA-DQB #2 MFI: 0 MFI
ANTI-DONOR HLA-DR #1 MFI: 0 MFI
ANTI-DONOR HLA-DR #2 MFI: 0 MFI

## 2021-01-05 LAB — FSAB CLASS 2 ANTIBODY SPECIFICITY: HLA CL2 AB RESULT: NEGATIVE

## 2021-01-05 LAB — FSAB CLASS 1 ANTIBODY SPECIFICITY: HLA CLASS 1 ANTIBODY RESULT: NEGATIVE

## 2021-01-05 MED FILL — METFORMIN 1,000 MG TABLET: ORAL | 90 days supply | Qty: 180 | Fill #1

## 2021-01-05 MED FILL — TERAZOSIN 1 MG CAPSULE: ORAL | 30 days supply | Qty: 90 | Fill #4

## 2021-01-05 MED FILL — NOVOLOG FLEXPEN U-100 INSULIN ASPART 100 UNIT/ML (3 ML) SUBCUTANEOUS: 30 days supply | Qty: 15 | Fill #0

## 2021-01-08 NOTE — Unmapped (Signed)
[facility] Refill Coordination Note    Specialty Medication(s) to be Shipped:   Transplant: valgancyclovir 450mg  and Prednisone 5mg     Other medication(s) to be shipped: advair, test strips, lancets, azithromycin, calcium, magnesium, pravastatin     Spencer Pena Sr., DOB: June 12, 1950  Phone: [phone] (home) [phone] (work)      All above HIPAA information was verified with patient's caregiver, Spencer Pena     Was a Nurse, learning disability used for this call? No    Completed refill call assessment today to schedule patient's medication shipment from the [facility] [phone]).  All relevant notes have been reviewed.     Specialty medication(s) and dose(s) confirmed: Regimen is correct and unchanged.   Changes to medications: Fredrich reports no changes at this time.  Changes to insurance: No  New side effects reported not previously addressed with a pharmacist or physician: None reported  Questions for the pharmacist: No    Confirmed patient received a Conservation officer, historic buildings and a Surveyor, mining with first shipment. The patient will receive a drug information handout for each medication shipped and additional FDA Medication Guides as required.       DISEASE/MEDICATION-SPECIFIC INFORMATION        N/A    SPECIALTY MEDICATION ADHERENCE     Medication Adherence    Patient reported X missed doses in the last month: 0  Specialty Medication: Prednisone 5mg   Patient is on additional specialty medications: Yes  Additional Specialty Medications: Valganciclovir 450mg   Patient Reported Additional Medication X Missed Doses in the Last Month: 0  Patient is on more than two specialty medications: No        Were doses missed due to medication being on hold? No    Prednisone 5 mg: 6 days of medicine on hand   Valaganciclovir 450 mg: 6 days of medicine on hand     REFERRAL TO PHARMACIST     Referral to the pharmacist: Not needed      [facility]     Shipping address confirmed in Epic.     Delivery Scheduled: Yes, Expected medication delivery date: 01/13/2021.     Medication will be delivered via Same Day Courier to the prescription address in Epic WAM.    Spencer Pena   [facility] Specialty Technician

## 2021-01-12 MED FILL — PRAVASTATIN 40 MG TABLET: ORAL | 30 days supply | Qty: 30 | Fill #3

## 2021-01-12 MED FILL — MAGNESIUM OXIDE 400 MG (241.3 MG MAGNESIUM) TABLET: ORAL | 30 days supply | Qty: 180 | Fill #3

## 2021-01-12 MED FILL — ON CALL LANCET 30 GAUGE: 20 days supply | Qty: 100 | Fill #6

## 2021-01-12 MED FILL — PREDNISONE 5 MG TABLET: ORAL | 30 days supply | Qty: 60 | Fill #1

## 2021-01-12 MED FILL — VALGANCICLOVIR 450 MG TABLET: ORAL | 30 days supply | Qty: 30 | Fill #6

## 2021-01-12 MED FILL — ADVAIR HFA 115 MCG-21 MCG/ACTUATION AEROSOL INHALER: RESPIRATORY_TRACT | 30 days supply | Qty: 12 | Fill #3

## 2021-01-12 MED FILL — AZITHROMYCIN 250 MG TABLET: ORAL | 30 days supply | Qty: 30 | Fill #7

## 2021-01-12 MED FILL — ON CALL EXPRESS TEST STRIP: 20 days supply | Qty: 100 | Fill #6

## 2021-01-12 MED FILL — CALCIUM CARBONATE 600 MG CALCIUM (1,500 MG) TABLET: ORAL | 30 days supply | Qty: 60 | Fill #7

## 2021-01-19 NOTE — Unmapped (Signed)
Incoming call from [employer] Sr.Marland Kitchen  He asked if he could take revatio, he has 20 mg pills on hand. D/W [name] and advised him he can take up to 60 mg at once.  Questions answered, verbalized understanding.

## 2021-01-23 ENCOUNTER — Ambulatory Visit: Admit: 2021-01-23 | Discharge: 2021-01-24 | Payer: MEDICARE

## 2021-01-23 LAB — COMPREHENSIVE METABOLIC PANEL
ALBUMIN: 3.7 g/dL (ref 3.4–5.0)
ALKALINE PHOSPHATASE: 76 U/L (ref 46–116)
ALT (SGPT): 28 U/L (ref 10–49)
ANION GAP: 7 mmol/L (ref 5–14)
AST (SGOT): 25 U/L (ref <=34)
BILIRUBIN TOTAL: 0.4 mg/dL (ref 0.3–1.2)
BLOOD UREA NITROGEN: 37 mg/dL — ABNORMAL HIGH (ref 9–23)
BUN / CREAT RATIO: 27
CALCIUM: 9.4 mg/dL (ref 8.7–10.4)
CHLORIDE: 105 mmol/L (ref 98–107)
CO2: 30.3 mmol/L (ref 20.0–31.0)
CREATININE: 1.39 mg/dL — ABNORMAL HIGH
EGFR CKD-EPI (2021) MALE: 55 mL/min/{1.73_m2} — ABNORMAL LOW (ref >=60)
GLUCOSE RANDOM: 102 mg/dL (ref 70–179)
POTASSIUM: 4 mmol/L (ref 3.4–4.8)
PROTEIN TOTAL: 6.1 g/dL (ref 5.7–8.2)
SODIUM: 142 mmol/L (ref 135–145)

## 2021-01-23 LAB — CBC W/ AUTO DIFF
BASOPHILS ABSOLUTE COUNT: 0 10*9/L (ref 0.0–0.1)
BASOPHILS RELATIVE PERCENT: 0.8 %
EOSINOPHILS ABSOLUTE COUNT: 0 10*9/L (ref 0.0–0.5)
EOSINOPHILS RELATIVE PERCENT: 0.8 %
HEMATOCRIT: 35.8 % — ABNORMAL LOW (ref 39.0–48.0)
HEMOGLOBIN: 12.2 g/dL — ABNORMAL LOW (ref 12.9–16.5)
LYMPHOCYTES ABSOLUTE COUNT: 0.6 10*9/L — ABNORMAL LOW (ref 1.1–3.6)
LYMPHOCYTES RELATIVE PERCENT: 18.9 %
MEAN CORPUSCULAR HEMOGLOBIN CONC: 34 g/dL (ref 32.0–36.0)
MEAN CORPUSCULAR HEMOGLOBIN: 31.7 pg (ref 25.9–32.4)
MEAN CORPUSCULAR VOLUME: 93.2 fL (ref 77.6–95.7)
MEAN PLATELET VOLUME: 7 fL (ref 6.8–10.7)
MONOCYTES ABSOLUTE COUNT: 0.2 10*9/L — ABNORMAL LOW (ref 0.3–0.8)
MONOCYTES RELATIVE PERCENT: 6.6 %
NEUTROPHILS ABSOLUTE COUNT: 2.3 10*9/L (ref 1.8–7.8)
NEUTROPHILS RELATIVE PERCENT: 72.9 %
NUCLEATED RED BLOOD CELLS: 0 /100{WBCs} (ref <=4)
PLATELET COUNT: 124 10*9/L — ABNORMAL LOW (ref 150–450)
RED BLOOD CELL COUNT: 3.84 10*12/L — ABNORMAL LOW (ref 4.26–5.60)
RED CELL DISTRIBUTION WIDTH: 14.7 % (ref 12.2–15.2)
WBC ADJUSTED: 3.1 10*9/L — ABNORMAL LOW (ref 3.6–11.2)

## 2021-01-23 LAB — PHOSPHORUS: PHOSPHORUS: 3.2 mg/dL (ref 2.4–5.1)

## 2021-01-23 LAB — MAGNESIUM: MAGNESIUM: 1.6 mg/dL (ref 1.6–2.6)

## 2021-01-23 LAB — IGG: GAMMAGLOBULIN; IGG: 475 mg/dL — ABNORMAL LOW (ref 646–2013)

## 2021-01-23 LAB — TACROLIMUS LEVEL, TROUGH: TACROLIMUS, TROUGH: 8.8 ng/mL (ref 5.0–15.0)

## 2021-01-23 NOTE — Unmapped (Signed)
Called Spencer Mallet Sr. to discuss lab results. Tacrolimus level was normal at 8.8 with a goal of 7-9. Medication regimen is currently 1 mg BID. Dosing at this time to remain the same. Repeat labs scheduled monthly. Resulted labs reviewed. All questions answered. Pt verbalized understanding.     Spencer Pena said he has been having pin prick feeling along arms and legs at night.  D/W Dr. Dudley Major and advised Spencer Pena we will monitor for now and if it worsens to let me know.

## 2021-01-24 LAB — CMV DNA, QUANTITATIVE, PCR: CMV VIRAL LD: NOT DETECTED

## 2021-01-26 LAB — EBV QUANTITATIVE PCR, BLOOD: EBV VIRAL LOAD RESULT: NOT DETECTED

## 2021-02-02 MED FILL — CHOLECALCIFEROL (VITAMIN D3) 25 MCG (1,000 UNIT) TABLET: ORAL | 50 days supply | Qty: 100 | Fill #4

## 2021-02-02 MED FILL — ACETAMINOPHEN 500 MG TABLET: ORAL | 23 days supply | Qty: 180 | Fill #2

## 2021-02-02 NOTE — Unmapped (Signed)
[facility] Clinical Assessment & Refill Coordination Note    Spencer TURPIN Sr., DOB: Dec 12, 1950  Phone: [phone] (home) [phone] (work)    All above HIPAA information was verified with patient.     Was a Nurse, learning disability used for this call? No    Specialty Medication(s):   Transplant: valgancyclovir 450mg , Prednisone 5mg  and azathioprine 50mg      Current Outpatient Medications   Medication Sig Dispense Refill   ??? acetaminophen (TYLENOL EXTRA STRENGTH) 500 MG tablet Take 1-2 tablets (500-1,000 mg total) by mouth every six (6) hours as needed for pain. 180 tablet PRN   ??? aspirin (ECOTRIN) 81 MG tablet Take 1 tablet (81 mg total) by mouth daily. 90 tablet 3   ??? azaTHIOprine (IMURAN) 50 mg tablet Take 1 tablet (50 mg total) by mouth daily. 90 tablet 3   ??? azithromycin (ZITHROMAX) 250 MG tablet Take 1 tablet (250 mg total) by mouth daily. 30 tablet 11   ??? blood sugar diagnostic (ON CALL EXPRESS TEST STRIP) Strp Test daily before all meals/snacks and once before bedtime. 100 each 11   ??? calcium carbonate (CALCIUM 600) 1,500 mg (600 mg elem calcium) tablet Take 1 tablet (600 mg of elem calcium total) by mouth Two (2) times a day. 60 tablet 11   ??? cholecalciferol, vitamin D3 25 mcg, 1,000 units,, 1,000 unit (25 mcg) tablet Take 2 tablets (50 mcg total) by mouth daily. 100 tablet 11   ??? fluticasone propion-salmeteroL (ADVAIR HFA) 115-21 mcg/actuation inhaler Inhale 2 puffs Two (2) times a day. 12 g 11   ??? insulin ASPART (NOVOLOG FLEXPEN U-100 INSULIN) 100 unit/mL (3 mL) injection pen Inject 7 units with breakfast (10 units with high sugar breakfast), 7 units with afternoon meal, and 3 units with evening snack plus sliding scale 2 units for every 50 >150; max 50 units/day 15 mL 2   ??? insulin glargine (BASAGLAR, LANTUS) 100 unit/mL (3 mL) injection pen Inject 0.12 mL (12 Units total) under the skin nightly. 15 mL 3   ??? lancets 30 gauge Misc Test daily before all meals/snacks and once before bedtime. 100 each 11   ??? magnesium oxide (MAG-OX) 400 mg (241.3 mg elemental magnesium) tablet Take 3 tablets (1,200 mg total) by mouth Two (2) times a day. 180 tablet 11   ??? metFORMIN (GLUCOPHAGE) 1000 MG tablet Take 1 tablet (1,000 mg total) by mouth Two (2) times a day. 180 tablet 3   ??? pantoprazole (PROTONIX) 40 MG tablet Take 1 tablet (40 mg total) by mouth daily. 90 tablet 3   ??? pen needle, diabetic (BD ULTRA-FINE NANO PEN NEEDLE) 32 gauge x 5/32 (4 mm) Ndle Use as directed for injections five (5) times a day. 150 each 11   ??? pravastatin (PRAVACHOL) 40 MG tablet Take 1 tablet (40 mg total) by mouth daily. 30 tablet 11   ??? predniSONE (DELTASONE) 5 MG tablet Take 2 tablets (10 mg total) by mouth in the morning. 60 tablet 11   ??? sulfamethoxazole-trimethoprim (BACTRIM) 400-80 mg per tablet Take 1 tablet (80 mg of trimethoprim total) by mouth Every Monday, Wednesday, and Friday. 36 tablet 3   ??? tacrolimus (PROGRAF) 1 MG capsule Take 1 capsule (1 mg total) by mouth two (2) times a day. 180 capsule 3   ??? terazosin (HYTRIN) 1 MG capsule Take 3 capsules (3 mg total) by mouth nightly. 90 capsule 11   ??? valGANciclovir (VALCYTE) 450 mg tablet Take 1 tablet (450 mg total)  by mouth daily. 30 tablet 11     No current facility-administered medications for this visit.        Changes to medications: Spencer Pena reports no changes at this time.    Allergies   Allergen Reactions   ??? Cetuximab Anaphylaxis     Chest pain/rapid heart rate/ BP dropped   ??? Nsaids (Non-Steroidal Anti-Inflammatory Drug)      Interaction with transplant medications   ??? Venom-Honey Bee Anaphylaxis   ??? Enalapril      Angioedema     ??? Pollen Extracts Other (See Comments)     Nasal stuffiness, coughing, sneezing       Changes to allergies: No    SPECIALTY MEDICATION ADHERENCE     valganciclovir 450mg   : 15 days of medicine on hand   Azathioprine 50mg   : 18 days of medicine on hand   Prednisone 5mg   : 15 days of medicine on hand       Medication Adherence Patient reported X missed doses in the last month: 0  Specialty Medication: prednisone 5mg   Patient is on additional specialty medications: Yes  Additional Specialty Medications: Valganciclovir 450mg   Patient Reported Additional Medication X Missed Doses in the Last Month: 0  Patient is on more than two specialty medications: Yes  Specialty Medication: azathioprine 50mg   Patient Reported Additional Medication X Missed Doses in the Last Month: 0          Specialty medication(s) dose(s) confirmed: Regimen is correct and unchanged.     Are there any concerns with adherence? No    Adherence counseling provided? Not needed    CLINICAL MANAGEMENT AND INTERVENTION      Clinical Benefit Assessment:    Do you feel the medicine is effective or helping your condition? Yes    Clinical Benefit counseling provided? Not needed    Adverse Effects Assessment:    Are you experiencing any side effects? No    Are you experiencing difficulty administering your medicine? No    Quality of Life Assessment:         How many days over the past month did your transplant  keep you from your normal activities? For example, brushing your teeth or getting up in the morning. 0    Have you discussed this with your provider? Not needed    Acute Infection Status:    Acute infections noted within Epic:  No active infections  Patient reported infection: None    Therapy Appropriateness:    Is therapy appropriate and patient progressing towards therapeutic goals? Yes, therapy is appropriate and should be continued    DISEASE/MEDICATION-SPECIFIC INFORMATION      N/A    PATIENT SPECIFIC NEEDS     - Does the patient have any physical, cognitive, or cultural barriers? No    - Is the patient high risk? No    - Does the patient require a Care Management Plan? No     - Does the patient require physician intervention or other additional services (i.e. nutrition, smoking cessation, social work)? No      SHIPPING     Specialty Medication(s) to be Shipped: na    Other medication(s) to be shipped: tylenol, vitamin d     Changes to insurance: No    Delivery Scheduled: Yes, Expected medication delivery date: 02/03/2021.     Medication will be delivered via UPS to the confirmed prescription address in [facility].    The patient will receive a drug information handout for  each medication shipped and additional FDA Medication Guides as required.  Verified that patient has previously received a Conservation officer, historic buildings and a Surveyor, mining.    The patient or caregiver noted above participated in the development of this care plan and knows that they can request review of or adjustments to the care plan at any time.      All of the patient's questions and concerns have been addressed.    Thad Ranger   [facility] Specialty Pharmacist

## 2021-02-03 ENCOUNTER — Ambulatory Visit: Admit: 2021-02-03 | Discharge: 2021-02-04 | Payer: MEDICARE

## 2021-02-03 DIAGNOSIS — Z942 Lung transplant status: Principal | ICD-10-CM

## 2021-02-03 DIAGNOSIS — N471 Phimosis: Principal | ICD-10-CM

## 2021-02-03 NOTE — Unmapped (Signed)
Urology Surgery Scheduling Checklist    Procedure: circumcsion 54161 (60 min)  Timing: Next available  Attending: Musa Rewerts  Urology Pre-op:  No  Pre Anesthesia (Precare):  Yes - Order placed  Labs:  None  Urine:  No urine  Testing Plan:  No additional testing  Blood thinners:  no  Cardiac/Anticoagulation Clearance: needs pulm clearance after transplant - note sent to [facility]  COVID Test:  Not indicated - outpatient surgery  Post Op Appointment(s): APP Any available in 4 weeks  Special requests: None  Other:  None  Case request:  Yes - Ordered + checklist in comments    See UROPREOPLABS for standard preop testing    [facility] NEW PATIENT VISIT    ID/CC:    Chief Complaint   Patient presents with   ??? phimosis       Consult requested by:     Jamesetta Geralds, MD  [address]  [address] [place],  [place] [address]    History of Present Illness:   Spencer Sattar. is a 70 y.o. male who presents to me in consultation for phimosis.    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    Phimosis    ongoing for many years   primarily issue with frenulum causing downward curve and urinating on self   mild phimosis, no cleaning issues   had been considering surgery several years ago      Labs Reviewed today with the patient include:    Lab Results   Component Value Date    Hemoglobin A1C 5.1 12/03/2020    Hemoglobin A1C 6.0 (H) 06/13/2020    Hemoglobin A1C 6.5 (H) 05/15/2020    Hemoglobin A1C 7.1 (A) 12/19/2015    Hemoglobin A1C 7.1 (A) 12/18/2014    Hemoglobin A1C 7.4 (H) 07/26/2014    Hemoglobin A1C 7.1 (H) 01/02/2014    Hemoglobin A1C 6.5 (H) 03/23/2013      Lab Results   Component Value Date/Time    PSA 0.99 02/12/2020 03:42 PM    PSA 0.87 07/23/2019 01:39 PM    PSA 0.83 01/19/2019 11:54 AM           Past Medical Hx:    Past Medical History:   Diagnosis Date   ??? Arthritis    ??? BPH (benign prostatic hyperplasia)    ??? Cancer (CMS-HCC)     Colon 2006;  Liver 2006   ??? Cataract    ??? Diabetes mellitus (CMS-HCC) 2001    Type II   ??? Diverticulosis    ??? Dry eyes    ??? Emphysema of lung (CMS-HCC)    ??? History of chemotherapy 2006-2007   ??? History of iron deficiency anemia    ??? History of transfusion    ??? Internal hemorrhoids    ??? Medical history reviewed with no changes 05/22/2018    per pt     Patient Active Problem List   Diagnosis   ??? Malignant neoplasm of colon (CMS-HCC)   ??? Mixed hyperlipidemia   ??? Cataract   ??? Type 2 diabetes mellitus without complication (CMS-HCC)   ??? Colon cancer high risk   ??? Hypertension   ??? 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 Hip pain   ??? Lumbar radiculopathy   ??? Spinal stenosis   ??? Chronic pain   ??? Edema   ??? Routine general medical examination at a health care facility   ??? Other acute sinusitis   ??? Hordeolum externum  of left upper eyelid   ??? Abnormal CXR   ??? Cataract, nuclear sclerotic, right eye   ??? Angioedema due to angiotensin converting enzyme inhibitor (ACE-I)   ??? Oral aphthae   ??? Benign prostatic hyperplasia with incomplete bladder emptying   ??? ILD (interstitial lung disease) (CMS-HCC)   ??? Lung transplant recipient (CMS-HCC)   ??? Lung replaced by transplant (CMS-HCC)   ??? Hypogammaglobulinemia (CMS-HCC)       Past Surgical Hx:    Past Surgical History:   Procedure Laterality Date   ??? CARDIAC SURGERY      cardiac cath   ??? CATARACT EXTRACTION Left 2011   ??? CATARACT EXTRACTION Right 05/25/2018    PC/IOL   ??? CHG [place], CHEST,REAL TIME Bilateral 06/20/2020    Procedure: ULTRASOUND, CHEST, REAL TIME WITH IMAGE DOCUMENTATION;  Surgeon: Anderson Malta, MD;  Location: BRONCH PROCEDURE LAB [facility];  Service: Pulmonary   ??? COLECTOMY     ??? COLON SURGERY  2006   ??? EYE SURGERY Bilateral 2011; 2018    cataract surgery    ??? LIVER SURGERY  2007   ??? PR BRONCHOSCOPY,DIAGNOSTIC N/A 05/15/2020    Procedure: BRONCHOSCOPY, RIGID OR FLEXIBLE, W/WO FLUOROSCOPIC GUIDANCE; DIAGNOSTIC, WITH CELL WASHING, WHEN PERFORMED;  Surgeon: Evert Kohl, MD;  Location: MAIN OR [facility];  Service: Thoracic   ??? PR CATH PLACE/CORON ANGIO, IMG SUPER/INTERP,R&L HRT CATH, L HRT VENTRIC N/A 03/04/2020    Procedure: Left/Right Heart Catheterization;  Surgeon: Lesle Reek, MD;  Location: [facility] CATH;  Service: Cardiology   ??? PR COLONOSCOPY FLX DX W/COLLJ SPEC WHEN PFRMD N/A 12/28/2016    Procedure: COLONOSCOPY, FLEXIBLE, PROXIMAL TO SPLENIC FLEXURE; DIAGNOSTIC, W/WO COLLECTION SPECIMEN BY BRUSH OR WASH;  Surgeon: Alfred Levins, MD;  Location: [facility] GI PROCEDURES [facility];  Service: Gastroenterology   ??? PR ESOPHAGEAL MOTILITY STUDY, MANOMETRY N/A 07/23/2020    Procedure: ESOPHAGEAL MOTILITY STUDY W/INT & REP;  Surgeon: Nurse-Based Giproc;  Location: GI PROCEDURES [facility];  Service: Gastroenterology   ??? PR GERD TST W/ NASAL PH ELECTROD N/A 07/23/2020    Procedure: ESOPHAGEAL 24 HOUR PH MONITORING;  Surgeon: Nurse-Based Giproc;  Location: GI PROCEDURES [facility];  Service: Gastroenterology   ??? PR LUNG TRANSPLANT,DBL W CP BYPASS Bilateral 05/15/2020    Procedure: LUNG TRANSPL DBL; Ella Jubilee BYPASS;  Surgeon: Evert Kohl, MD;  Location: MAIN OR [facility];  Service: Thoracic   ??? PR UPPER GI ENDOSCOPY,BIOPSY N/A 06/26/2020    Procedure: UGI ENDOSCOPY; WITH BIOPSY, SINGLE OR MULTIPLE;  Surgeon: Chriss Driver, MD;  Location: GI PROCEDURES [facility];  Service: Gastroenterology   ??? PR XCAPSL CTRC RMVL INSJ IO LENS PROSTH W/O ECP Right 05/25/2018    Procedure: EXTRACAPSULAR CATARACT REMOVAL W/INSERTION OF INTRAOCULAR LENS PROSTHESIS, MANUAL OR MECHANICAL TECHNIQUE;  Surgeon: Arville Care, MD;  Location: [facility] OR [facility];  Service: Ophthalmology         Active Meds:    No outpatient medications have been marked as taking for the 02/03/21 encounter (Office Visit) with Lytle Butte, MD.        Allergies:    Allergies as of 02/03/2021 - Reviewed 02/03/2021   Allergen Reaction Noted   ??? Cetuximab Anaphylaxis 08/28/2012   ??? Nsaids (non-steroidal anti-inflammatory drug)  05/16/2020   ??? Venom-honey bee Anaphylaxis 03/23/2013 ??? Enalapril  01/22/2019   ??? Pollen extracts Other (See Comments) 01/02/2013         OBJECTIVE:  Physical Exam:  BP 131/63  - Pulse  95  - Temp 36.5 ??C (97.7 ??F) (Temporal)  - Ht 171.5 cm (5' 7.5)  - Wt 75 kg (165 lb 6.4 oz)  - SpO2 98%  - BMI 25.52 kg/m??     GU Exam:    non circumcised male phallus. Very tight frenulum. no plaque. Orthotopic meatus.  Bilaterally descended testicles.           ASSESSMENT/PLAN:   Spencer RUSSOM Sr. is a 70 y.o. male with phimosis and tight frenulum    Risks and benefits of circumcision were discussed with the patient. The patient was informed of the risk of bleeding, infection, and  need for re-operation. There is a risk of poor cosmesis. He was informed of the risk of recurrence marked swelling and pain or the need for further revision based off poor cosmesis. There is a risk of penile discomfort and or numbness.  He was informed that there is a risk of unanticipated events, such as, but not limited to, myocardial infarction, stroke, pneumonia, venous thrombosis, or even death.   The patient is aware of alternative therapies, including doing nothing.  The patient was given an opportunity to ask questions, and voiced an understanding of the procedure. The patient desires to proceed with the surgery.    Stop ASA / NSAIDs / warfarin / Xarelto and other blood thinners 7 days  prior       Circ and frenuloplasty  Needs pulm clearance post transplant    Thank you very much for sending Mr. Spencer PACI Sr. to our clinic at [facility].

## 2021-02-03 NOTE — Unmapped (Signed)
Frutoso Chase surgical scheduler    [phone]

## 2021-02-04 DIAGNOSIS — Z298 Encounter for other specified prophylactic measures: Principal | ICD-10-CM

## 2021-02-04 MED ORDER — SULFAMETHOXAZOLE 400 MG-TRIMETHOPRIM 80 MG TABLET
ORAL_TABLET | ORAL | 3 refills | 84.00000 days | Status: CP
Start: 2021-02-04 — End: 2021-05-05
  Filled 2021-02-09: qty 36, 84d supply, fill #0

## 2021-02-04 NOTE — Unmapped (Signed)
Call returned to [name], he left a message requesting a return call. He needed a bactrim refill, orders sent to [facility]

## 2021-02-05 MED FILL — MAGNESIUM OXIDE 400 MG (241.3 MG MAGNESIUM) TABLET: ORAL | 30 days supply | Qty: 180 | Fill #4

## 2021-02-05 MED FILL — AZATHIOPRINE 50 MG TABLET: ORAL | 90 days supply | Qty: 90 | Fill #1

## 2021-02-05 MED FILL — TERAZOSIN 1 MG CAPSULE: ORAL | 30 days supply | Qty: 90 | Fill #5

## 2021-02-05 MED FILL — PRAVASTATIN 40 MG TABLET: ORAL | 30 days supply | Qty: 30 | Fill #4

## 2021-02-05 NOTE — Unmapped (Signed)
TC to patient to schedule Anesthesia Evaluation.    Patient reports that he is unsure if he will proceed with surgery and if he does, it will be done after the first of the year.    Request removed from queue.

## 2021-02-09 NOTE — Unmapped (Signed)
[facility] Refill Coordination Note    Specialty Medication(s) to be Shipped:   Transplant: valgancyclovir 450mg     Other medication(s) to be shipped:     advair  Azithromycin  Calcium carb  Test strips  Lancets  Prednisone  novolog pen needles     Spencer Mallet Sr., DOB: [birth year]  Phone: [phone] (home) [phone] (work)      All above HIPAA information was verified with patient's caregiver, wife     Was a Nurse, learning disability used for this call? No    Completed refill call assessment today to schedule patient's medication shipment from the [facility] [phone]).  All relevant notes have been reviewed.     Specialty medication(s) and dose(s) confirmed: Regimen is correct and unchanged.   Changes to medications: [name] reports no changes at this time.  Changes to insurance: No  New side effects reported not previously addressed with a pharmacist or physician: None reported  Questions for the pharmacist: No    Confirmed patient received a Conservation officer, historic buildings and a Surveyor, mining with first shipment. The patient will receive a drug information handout for each medication shipped and additional FDA Medication Guides as required.       DISEASE/MEDICATION-SPECIFIC INFORMATION        N/A    SPECIALTY MEDICATION ADHERENCE     Medication Adherence    Patient reported X missed doses in the last month: 0  Specialty Medication: valGANciclovir (VALCYTE) 450 mg tablet  Patient is on additional specialty medications: No              Were doses missed due to medication being on hold? No    valgancyclovir 450mg   8 days worth of medication on hand.      REFERRAL TO PHARMACIST     Referral to the pharmacist: Not needed      [facility]     Shipping address confirmed in Epic.     Delivery Scheduled: Yes, Expected medication delivery date: 02/11/21.     Medication will be delivered via Same Day Courier to the prescription address in Epic WAM.    Spencer Pena   [facility] Specialty Technician

## 2021-02-11 MED FILL — ON CALL LANCET 30 GAUGE: 20 days supply | Qty: 100 | Fill #7

## 2021-02-11 MED FILL — ON CALL EXPRESS TEST STRIP: 20 days supply | Qty: 100 | Fill #7

## 2021-02-11 MED FILL — CALCIUM CARBONATE 600 MG CALCIUM (1,500 MG) TABLET: ORAL | 30 days supply | Qty: 60 | Fill #8

## 2021-02-11 MED FILL — NOVOLOG FLEXPEN U-100 INSULIN ASPART 100 UNIT/ML (3 ML) SUBCUTANEOUS: 30 days supply | Qty: 15 | Fill #1

## 2021-02-11 MED FILL — AZITHROMYCIN 250 MG TABLET: ORAL | 30 days supply | Qty: 30 | Fill #8

## 2021-02-11 MED FILL — PREDNISONE 5 MG TABLET: ORAL | 30 days supply | Qty: 60 | Fill #2

## 2021-02-11 MED FILL — BD ULTRA-FINE NANO PEN NEEDLE 32 GAUGE X 5/32" (4 MM): SUBCUTANEOUS | 40 days supply | Qty: 200 | Fill #4

## 2021-02-11 MED FILL — VALGANCICLOVIR 450 MG TABLET: ORAL | 30 days supply | Qty: 30 | Fill #7

## 2021-02-11 MED FILL — ADVAIR HFA 115 MCG-21 MCG/ACTUATION AEROSOL INHALER: RESPIRATORY_TRACT | 30 days supply | Qty: 12 | Fill #4

## 2021-02-27 NOTE — Unmapped (Signed)
Sent my chart message asking if he is still interested in surgery

## 2021-03-06 MED ORDER — OSELTAMIVIR 30 MG CAPSULE
ORAL_CAPSULE | Freq: Every day | ORAL | 3 refills | 30 days | Status: CP
Start: 2021-03-06 — End: ?

## 2021-03-06 NOTE — Unmapped (Signed)
Per ICHID and Lung Transplant Teams, influenza cases in community rising to the threshold to begin seasonal influenza ppx for all lung txp patients at high risk for infection.    Rx sent to [facility] for oseltamivir 30mg  PO daily based on CrCl ~88mL/min.     Notified [facility] to communicate with patient.    Letha Cape, PharmD, BCPS, CPP

## 2021-03-09 ENCOUNTER — Ambulatory Visit: Admit: 2021-03-09 | Discharge: 2021-03-10 | Payer: MEDICARE

## 2021-03-09 LAB — CBC W/ AUTO DIFF
BASOPHILS ABSOLUTE COUNT: 0 10*9/L (ref 0.0–0.1)
BASOPHILS RELATIVE PERCENT: 0.8 %
EOSINOPHILS ABSOLUTE COUNT: 0 10*9/L (ref 0.0–0.5)
EOSINOPHILS RELATIVE PERCENT: 0.7 %
HEMATOCRIT: 36.5 % — ABNORMAL LOW (ref 39.0–48.0)
HEMOGLOBIN: 12 g/dL — ABNORMAL LOW (ref 12.9–16.5)
LYMPHOCYTES ABSOLUTE COUNT: 0.7 10*9/L — ABNORMAL LOW (ref 1.1–3.6)
LYMPHOCYTES RELATIVE PERCENT: 16 %
MEAN CORPUSCULAR HEMOGLOBIN CONC: 32.9 g/dL (ref 32.0–36.0)
MEAN CORPUSCULAR HEMOGLOBIN: 31.4 pg (ref 25.9–32.4)
MEAN CORPUSCULAR VOLUME: 95.2 fL (ref 77.6–95.7)
MEAN PLATELET VOLUME: 7.3 fL (ref 6.8–10.7)
MONOCYTES ABSOLUTE COUNT: 0.3 10*9/L (ref 0.3–0.8)
MONOCYTES RELATIVE PERCENT: 6.9 %
NEUTROPHILS ABSOLUTE COUNT: 3.3 10*9/L (ref 1.8–7.8)
NEUTROPHILS RELATIVE PERCENT: 75.6 %
NUCLEATED RED BLOOD CELLS: 0 /100{WBCs} (ref <=4)
PLATELET COUNT: 136 10*9/L — ABNORMAL LOW (ref 150–450)
RED BLOOD CELL COUNT: 3.83 10*12/L — ABNORMAL LOW (ref 4.26–5.60)
RED CELL DISTRIBUTION WIDTH: 15.4 % — ABNORMAL HIGH (ref 12.2–15.2)
WBC ADJUSTED: 4.3 10*9/L (ref 3.6–11.2)

## 2021-03-09 LAB — COMPREHENSIVE METABOLIC PANEL
ALBUMIN: 4 g/dL (ref 3.4–5.0)
ALKALINE PHOSPHATASE: 94 U/L (ref 46–116)
ALT (SGPT): 40 U/L (ref 10–49)
ANION GAP: 6 mmol/L (ref 5–14)
AST (SGOT): 32 U/L (ref <=34)
BILIRUBIN TOTAL: 0.4 mg/dL (ref 0.3–1.2)
BLOOD UREA NITROGEN: 43 mg/dL — ABNORMAL HIGH (ref 9–23)
BUN / CREAT RATIO: 29
CALCIUM: 9.8 mg/dL (ref 8.7–10.4)
CHLORIDE: 107 mmol/L (ref 98–107)
CO2: 30.7 mmol/L (ref 20.0–31.0)
CREATININE: 1.47 mg/dL — ABNORMAL HIGH
EGFR CKD-EPI (2021) MALE: 51 mL/min/{1.73_m2} — ABNORMAL LOW (ref >=60)
GLUCOSE RANDOM: 101 mg/dL (ref 70–179)
POTASSIUM: 4.3 mmol/L (ref 3.4–4.8)
PROTEIN TOTAL: 6.1 g/dL (ref 5.7–8.2)
SODIUM: 144 mmol/L (ref 135–145)

## 2021-03-09 LAB — IGG: GAMMAGLOBULIN; IGG: 561 mg/dL — ABNORMAL LOW (ref 646–2013)

## 2021-03-09 LAB — PHOSPHORUS: PHOSPHORUS: 2.4 mg/dL (ref 2.4–5.1)

## 2021-03-09 LAB — MAGNESIUM: MAGNESIUM: 1.8 mg/dL (ref 1.6–2.6)

## 2021-03-09 NOTE — Unmapped (Signed)
[facility] Refill Coordination Note    Specialty Medication(s) to be Shipped:   Transplant: valgancyclovir 450mg  and Prednisone 5mg     Other medication(s) to be shipped: pantopraozle, pravastatin, terazosin, tylenol, advair, azithromycin, calcium, magnesium, novolog, test strips, lancets and oseltamivir     Spencer Mallet Sr., DOB: 06-19-1950  Phone: [phone] (home) [phone] (work)      All above HIPAA information was verified with patient's caregiver, [name]     Was a Nurse, learning disability used for this call? No    Completed refill call assessment today to schedule patient's medication shipment from the [facility] [phone]).  All relevant notes have been reviewed.     Specialty medication(s) and dose(s) confirmed: Regimen is correct and unchanged.   Changes to medications: Spencer Pena reports no changes at this time.  Changes to insurance: No  New side effects reported not previously addressed with a pharmacist or physician: None reported  Questions for the pharmacist: No    Confirmed patient received a Conservation officer, historic buildings and a Surveyor, mining with first shipment. The patient will receive a drug information handout for each medication shipped and additional FDA Medication Guides as required.       DISEASE/MEDICATION-SPECIFIC INFORMATION        N/A    SPECIALTY MEDICATION ADHERENCE     Medication Adherence    Patient reported X missed doses in the last month: 0  Specialty Medication: Prednisone 5mg   Patient is on additional specialty medications: Yes  Additional Specialty Medications: Valganciclovir 450mg   Patient Reported Additional Medication X Missed Doses in the Last Month: 0  Patient is on more than two specialty medications: No        Were doses missed due to medication being on hold? No    Prednisone 5 mg: 5 days of medicine on hand   Valganciclovir 450 mg: 5 days of medicine on hand     REFERRAL TO PHARMACIST     Referral to the pharmacist: Not needed      [facility] Shipping address confirmed in Epic.     Delivery Scheduled: Yes, Expected medication delivery date: 03/10/2021.     Medication will be delivered via Same Day Courier to the prescription address in Epic WAM.    [name]   [facility] Specialty Technician

## 2021-03-10 DIAGNOSIS — Z942 Lung transplant status: Principal | ICD-10-CM

## 2021-03-10 LAB — TACROLIMUS LEVEL, TROUGH: TACROLIMUS, TROUGH: 11.3 ng/mL (ref 5.0–15.0)

## 2021-03-10 MED ORDER — TACROLIMUS 0.5 MG CAPSULE, IMMEDIATE-RELEASE
ORAL_CAPSULE | Freq: Every evening | ORAL | 3 refills | 90 days | Status: CP
Start: 2021-03-10 — End: 2022-03-10

## 2021-03-10 MED ORDER — TACROLIMUS 1 MG CAPSULE, IMMEDIATE-RELEASE
ORAL_CAPSULE | Freq: Every day | ORAL | 3 refills | 90 days | Status: CP
Start: 2021-03-10 — End: 2022-03-10

## 2021-03-10 MED FILL — PRAVASTATIN 40 MG TABLET: ORAL | 30 days supply | Qty: 30 | Fill #5

## 2021-03-10 MED FILL — MAGNESIUM OXIDE 400 MG (241.3 MG MAGNESIUM) TABLET: ORAL | 30 days supply | Qty: 180 | Fill #5

## 2021-03-10 MED FILL — ADVAIR HFA 115 MCG-21 MCG/ACTUATION AEROSOL INHALER: RESPIRATORY_TRACT | 30 days supply | Qty: 12 | Fill #5

## 2021-03-10 MED FILL — PANTOPRAZOLE 40 MG TABLET,DELAYED RELEASE: ORAL | 90 days supply | Qty: 90 | Fill #0

## 2021-03-10 MED FILL — NOVOLOG FLEXPEN U-100 INSULIN ASPART 100 UNIT/ML (3 ML) SUBCUTANEOUS: 30 days supply | Qty: 15 | Fill #2

## 2021-03-10 MED FILL — TERAZOSIN 1 MG CAPSULE: ORAL | 30 days supply | Qty: 90 | Fill #6

## 2021-03-10 MED FILL — CALCIUM CARBONATE 600 MG CALCIUM (1,500 MG) TABLET: ORAL | 30 days supply | Qty: 60 | Fill #9

## 2021-03-10 MED FILL — ACETAMINOPHEN 500 MG TABLET: ORAL | 23 days supply | Qty: 180 | Fill #3

## 2021-03-10 MED FILL — PREDNISONE 5 MG TABLET: ORAL | 30 days supply | Qty: 60 | Fill #3

## 2021-03-10 MED FILL — ON CALL LANCET 30 GAUGE: 20 days supply | Qty: 100 | Fill #8

## 2021-03-10 MED FILL — AZITHROMYCIN 250 MG TABLET: ORAL | 30 days supply | Qty: 30 | Fill #9

## 2021-03-10 MED FILL — VALGANCICLOVIR 450 MG TABLET: ORAL | 30 days supply | Qty: 30 | Fill #8

## 2021-03-10 MED FILL — ON CALL EXPRESS TEST STRIP: 20 days supply | Qty: 100 | Fill #8

## 2021-03-10 NOTE — Unmapped (Signed)
Called Spencer Mallet Sr. to discuss lab results. Tacrolimus level was increased at 11 with a goal of 7-9. Medication regimen is currently 1 mg BID. Discussed with [name], CPP, dosing at this time decreased to  1/0.5. Repeat labs scheduled weekly. Resulted labs reviewed. All questions answered. Pt verbalized understanding.

## 2021-03-11 LAB — EBV QUANTITATIVE PCR, BLOOD: EBV VIRAL LOAD RESULT: NOT DETECTED

## 2021-03-12 LAB — CMV DNA, QUANTITATIVE, PCR: CMV VIRAL LD: NOT DETECTED

## 2021-03-13 NOTE — Unmapped (Signed)
Abstraction Result Flowsheet Data    This patient's last AWV date: [facility] Last Medicare Wellness Visit Date: Not Found  This patients last WCC/CPE date: : 07/23/2019      Reason for Encounter  Reason for Encounter: Outreach  Primary Reason for Outreach: AWV  Outreach Call Outcome: Patient would like call back later (Still under the care of the transplant team for double lung. Would probably be the first of the year before he can do anything- at that time he'll be released)  Text Message: No                          Flu Vaccine Gap  Flu Vaccine Gap Reviewed: No  Micro Albumin Gap   Micro Albumin Gap Reviewed: No

## 2021-03-16 ENCOUNTER — Ambulatory Visit: Admit: 2021-03-16 | Discharge: 2021-03-17 | Payer: MEDICARE

## 2021-03-16 LAB — COMPREHENSIVE METABOLIC PANEL
ALBUMIN: 3.8 g/dL (ref 3.4–5.0)
ALKALINE PHOSPHATASE: 92 U/L (ref 46–116)
ALT (SGPT): 44 U/L (ref 10–49)
ANION GAP: 9 mmol/L (ref 5–14)
AST (SGOT): 46 U/L — ABNORMAL HIGH (ref <=34)
BILIRUBIN TOTAL: 0.3 mg/dL (ref 0.3–1.2)
BLOOD UREA NITROGEN: 28 mg/dL — ABNORMAL HIGH (ref 9–23)
BUN / CREAT RATIO: 20
CALCIUM: 9 mg/dL (ref 8.7–10.4)
CHLORIDE: 107 mmol/L (ref 98–107)
CO2: 30.3 mmol/L (ref 20.0–31.0)
CREATININE: 1.42 mg/dL — ABNORMAL HIGH
EGFR CKD-EPI (2021) MALE: 53 mL/min/{1.73_m2} — ABNORMAL LOW (ref >=60)
GLUCOSE RANDOM: 105 mg/dL (ref 70–179)
POTASSIUM: 4.1 mmol/L (ref 3.4–4.8)
PROTEIN TOTAL: 5.7 g/dL (ref 5.7–8.2)
SODIUM: 146 mmol/L — ABNORMAL HIGH (ref 135–145)

## 2021-03-16 LAB — CBC W/ AUTO DIFF
BASOPHILS ABSOLUTE COUNT: 0 10*9/L (ref 0.0–0.1)
BASOPHILS RELATIVE PERCENT: 0.8 %
EOSINOPHILS ABSOLUTE COUNT: 0 10*9/L (ref 0.0–0.5)
EOSINOPHILS RELATIVE PERCENT: 1.1 %
HEMATOCRIT: 35.7 % — ABNORMAL LOW (ref 39.0–48.0)
HEMOGLOBIN: 11.8 g/dL — ABNORMAL LOW (ref 12.9–16.5)
LYMPHOCYTES ABSOLUTE COUNT: 0.5 10*9/L — ABNORMAL LOW (ref 1.1–3.6)
LYMPHOCYTES RELATIVE PERCENT: 13.4 %
MEAN CORPUSCULAR HEMOGLOBIN CONC: 33.1 g/dL (ref 32.0–36.0)
MEAN CORPUSCULAR HEMOGLOBIN: 31.6 pg (ref 25.9–32.4)
MEAN CORPUSCULAR VOLUME: 95.7 fL (ref 77.6–95.7)
MEAN PLATELET VOLUME: 7.2 fL (ref 6.8–10.7)
MONOCYTES ABSOLUTE COUNT: 0.2 10*9/L — ABNORMAL LOW (ref 0.3–0.8)
MONOCYTES RELATIVE PERCENT: 5.5 %
NEUTROPHILS ABSOLUTE COUNT: 3 10*9/L (ref 1.8–7.8)
NEUTROPHILS RELATIVE PERCENT: 79.2 %
NUCLEATED RED BLOOD CELLS: 0 /100{WBCs} (ref <=4)
PLATELET COUNT: 120 10*9/L — ABNORMAL LOW (ref 150–450)
RED BLOOD CELL COUNT: 3.73 10*12/L — ABNORMAL LOW (ref 4.26–5.60)
RED CELL DISTRIBUTION WIDTH: 15.7 % — ABNORMAL HIGH (ref 12.2–15.2)
WBC ADJUSTED: 3.8 10*9/L (ref 3.6–11.2)

## 2021-03-16 LAB — PHOSPHORUS: PHOSPHORUS: 2.8 mg/dL (ref 2.4–5.1)

## 2021-03-16 LAB — CMV DNA, QUANTITATIVE, PCR: CMV VIRAL LD: NOT DETECTED

## 2021-03-16 LAB — SLIDE REVIEW

## 2021-03-16 LAB — IGG: GAMMAGLOBULIN; IGG: 558 mg/dL — ABNORMAL LOW (ref 646–2013)

## 2021-03-16 LAB — TACROLIMUS LEVEL, TROUGH: TACROLIMUS, TROUGH: 8.1 ng/mL (ref 5.0–15.0)

## 2021-03-16 LAB — MAGNESIUM: MAGNESIUM: 1.6 mg/dL (ref 1.6–2.6)

## 2021-03-16 MED ORDER — ZANAMIVIR 5 MG/ACTUATION BLISTER POWDER FOR INHALATION
Freq: Every day | RESPIRATORY_TRACT | 2 refills | 0 days | Status: CP
Start: 2021-03-16 — End: ?

## 2021-03-16 NOTE — Unmapped (Signed)
Called Spencer Pena Sr. to discuss lab results. Tacrolimus level was normal at 8.1 with a goal of 7-9. Medication regimen is currently 1/0.5. Dosing at this time to remain the same. Repeat labs scheduled 3 weeks with clinic. Resulted labs reviewed. All questions answered. Pt verbalized understanding.

## 2021-03-17 LAB — EBV QUANTITATIVE PCR, BLOOD: EBV VIRAL LOAD RESULT: NOT DETECTED

## 2021-03-17 MED ORDER — OSELTAMIVIR 30 MG CAPSULE
ORAL_CAPSULE | Freq: Two times a day (BID) | ORAL | 1 refills | 5.00000 days | Status: CP
Start: 2021-03-17 — End: 2021-03-17
  Filled 2021-03-17: qty 20, 10d supply, fill #0

## 2021-03-26 NOTE — Unmapped (Signed)
[facility] Refill Coordination Note    Specialty Medication(s) to be Shipped:   Transplant: valgancyclovir 450mg     Other medication(s) to be shipped: terazosin 1mg      Spencer CHAMPINE Sr., DOB: [birth year]  Phone: [phone] (home) [phone] (work)      All above HIPAA information was verified with patient.     Was a Nurse, learning disability used for this call? No    Completed refill call assessment today to schedule patient's medication shipment from the [facility] [phone]).  All relevant notes have been reviewed.     Specialty medication(s) and dose(s) confirmed: Regimen is correct and unchanged.   Changes to medications: Levester reports no changes at this time.  Changes to insurance: No  New side effects reported not previously addressed with a pharmacist or physician: None reported  Questions for the pharmacist: No    Confirmed patient received a Conservation officer, historic buildings and a Surveyor, mining with first shipment. The patient will receive a drug information handout for each medication shipped and additional FDA Medication Guides as required.       DISEASE/MEDICATION-SPECIFIC INFORMATION        N/A    SPECIALTY MEDICATION ADHERENCE     Medication Adherence    Patient reported X missed doses in the last month: 0  Specialty Medication: valGANciclovir 450 mg tablet (VALCYTE)  Patient is on additional specialty medications: No  Informant: patient              Were doses missed due to medication being on hold? No    Valganciclovir 450 mg: 14 days of medicine on hand       REFERRAL TO PHARMACIST     Referral to the pharmacist: Not needed      [facility]     Shipping address confirmed in Epic.     Delivery Scheduled: Yes, Expected medication delivery date: 04/07/21.     Medication will be delivered via Next Day Courier to the prescription address in Epic WAM.    [name]   [facility] Specialty Technician

## 2021-03-30 MED FILL — CHOLECALCIFEROL (VITAMIN D3) 25 MCG (1,000 UNIT) TABLET: ORAL | 50 days supply | Qty: 100 | Fill #5

## 2021-03-30 MED FILL — LANTUS SOLOSTAR U-100 INSULIN 100 UNIT/ML (3 ML) SUBCUTANEOUS PEN: SUBCUTANEOUS | 90 days supply | Qty: 15 | Fill #1

## 2021-03-30 MED FILL — BD ULTRA-FINE NANO PEN NEEDLE 32 GAUGE X 5/32" (4 MM): SUBCUTANEOUS | 40 days supply | Qty: 200 | Fill #5

## 2021-03-31 DIAGNOSIS — Z942 Lung transplant status: Principal | ICD-10-CM

## 2021-04-06 ENCOUNTER — Ambulatory Visit: Admit: 2021-04-06 | Discharge: 2021-04-07 | Payer: MEDICARE

## 2021-04-06 ENCOUNTER — Encounter: Admit: 2021-04-06 | Discharge: 2021-04-07 | Payer: MEDICARE

## 2021-04-06 DIAGNOSIS — Z942 Lung transplant status: Principal | ICD-10-CM

## 2021-04-06 LAB — PHOSPHORUS: PHOSPHORUS: 3.6 mg/dL (ref 2.4–5.1)

## 2021-04-06 LAB — CBC W/ AUTO DIFF
BASOPHILS ABSOLUTE COUNT: 0 10*9/L (ref 0.0–0.1)
BASOPHILS RELATIVE PERCENT: 1.1 %
EOSINOPHILS ABSOLUTE COUNT: 0 10*9/L (ref 0.0–0.5)
EOSINOPHILS RELATIVE PERCENT: 0.9 %
HEMATOCRIT: 37.4 % — ABNORMAL LOW (ref 39.0–48.0)
HEMOGLOBIN: 12.7 g/dL — ABNORMAL LOW (ref 12.9–16.5)
LYMPHOCYTES ABSOLUTE COUNT: 0.5 10*9/L — ABNORMAL LOW (ref 1.1–3.6)
LYMPHOCYTES RELATIVE PERCENT: 13.1 %
MEAN CORPUSCULAR HEMOGLOBIN CONC: 34 g/dL (ref 32.0–36.0)
MEAN CORPUSCULAR HEMOGLOBIN: 32 pg (ref 25.9–32.4)
MEAN CORPUSCULAR VOLUME: 94.2 fL (ref 77.6–95.7)
MEAN PLATELET VOLUME: 7.4 fL (ref 6.8–10.7)
MONOCYTES ABSOLUTE COUNT: 0.3 10*9/L (ref 0.3–0.8)
MONOCYTES RELATIVE PERCENT: 7 %
NEUTROPHILS ABSOLUTE COUNT: 3.2 10*9/L (ref 1.8–7.8)
NEUTROPHILS RELATIVE PERCENT: 77.9 %
PLATELET COUNT: 135 10*9/L — ABNORMAL LOW (ref 150–450)
RED BLOOD CELL COUNT: 3.97 10*12/L — ABNORMAL LOW (ref 4.26–5.60)
RED CELL DISTRIBUTION WIDTH: 15.3 % — ABNORMAL HIGH (ref 12.2–15.2)
WBC ADJUSTED: 4 10*9/L (ref 3.6–11.2)

## 2021-04-06 LAB — COMPREHENSIVE METABOLIC PANEL
ALBUMIN: 4.1 g/dL (ref 3.4–5.0)
ALKALINE PHOSPHATASE: 97 U/L (ref 46–116)
ALT (SGPT): 65 U/L — ABNORMAL HIGH (ref 10–49)
ANION GAP: 7 mmol/L (ref 5–14)
AST (SGOT): 50 U/L — ABNORMAL HIGH (ref <=34)
BILIRUBIN TOTAL: 0.4 mg/dL (ref 0.3–1.2)
BLOOD UREA NITROGEN: 37 mg/dL — ABNORMAL HIGH (ref 9–23)
BUN / CREAT RATIO: 24
CALCIUM: 9.9 mg/dL (ref 8.7–10.4)
CHLORIDE: 106 mmol/L (ref 98–107)
CO2: 32 mmol/L — ABNORMAL HIGH (ref 20.0–31.0)
CREATININE: 1.53 mg/dL — ABNORMAL HIGH
EGFR CKD-EPI (2021) MALE: 49 mL/min/{1.73_m2} — ABNORMAL LOW (ref >=60)
GLUCOSE RANDOM: 94 mg/dL (ref 70–99)
POTASSIUM: 4 mmol/L (ref 3.4–4.8)
PROTEIN TOTAL: 6.5 g/dL (ref 5.7–8.2)
SODIUM: 145 mmol/L (ref 135–145)

## 2021-04-06 LAB — IGG: GAMMAGLOBULIN; IGG: 583 mg/dL — ABNORMAL LOW (ref 646–2013)

## 2021-04-06 LAB — MAGNESIUM: MAGNESIUM: 1.7 mg/dL (ref 1.6–2.6)

## 2021-04-06 LAB — TACROLIMUS LEVEL, TROUGH: TACROLIMUS, TROUGH: 7.1 ng/mL (ref 5.0–15.0)

## 2021-04-06 MED FILL — TERAZOSIN 1 MG CAPSULE: ORAL | 30 days supply | Qty: 90 | Fill #7

## 2021-04-06 MED FILL — VALGANCICLOVIR 450 MG TABLET: ORAL | 30 days supply | Qty: 30 | Fill #9

## 2021-04-06 NOTE — Unmapped (Signed)
Per provider, the patient received influenza vaccine.  Patient ID verified with name and date of birth.  All screening questions were answered.  Vaccine(s) were administered as ordered.  See immunization history for documentation.  Patient tolerated the injection(s) well with no issues noted.  Vaccine Information sheet given to the patient.

## 2021-04-06 NOTE — Unmapped (Signed)
Called Spencer Mallet Sr. to discuss lab results. Tacrolimus level was normal at 7.1 with a goal of 7-9. Medication regimen is currently 1/0.5. Dosing at this time to remain the same. Repeat labs scheduled monthly. Resulted labs reviewed. All questions answered. Pt verbalized understanding.

## 2021-04-06 NOTE — Unmapped (Signed)
In with Dr. Glenard Haring to see Spencer Mallet Sr..  Doing well, PFTs are excellent. Will get second flu shot today.  Return in two months for one year visit.  Questions answered, verbalized understanding.

## 2021-04-06 NOTE — Unmapped (Signed)
Teaching physician attestation:    I saw and evaluated the patient, participating in the key portions of the service. I reviewed the resident???s note. I assessed the laboratory findings and personally reviewed the radiology.  I agree with the resident???s findings and plan. I spent more than 45 minutes of medical time on the care of this patient.     Shirleen Schirmer, MD

## 2021-04-06 NOTE — Unmapped (Signed)
Pulmonary Transplant Clinic    HISTORY:   HPI:  70 y.o. 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     --> basiliximab induction    --> CMV high risk (D+/R-)    --> EBV moderate risk (D+/R+)    --> Not Increased Risk Donor  - Colon Cancer 2007    --> s/p resection of liver mets    --> s/p resection of 6 inches of colon    --> FOLFOX Chemo  - type 2 diabetes mellitus     --> not insulin dependent  - OA    SOCIAL HISTORY:   - 20pk years, quit in 1991  - drinks about 10 drinks a week but will stop  - Works maintenance at [employer]  - Married and lives in [place]    FAMILY HISTORY:   - Parents lived into late 90's    MEDS: (personally reviewed in EPIC, pertinent meds noted below)         OBJECTIVE DATA:   PHYSICAL EXAM:  BP 134/72  - Pulse 89  - Temp 35.9 ??C (96.6 ??F) (Tympanic)  - Ht 171.5 cm (5' 7.52)  - Wt 74.5 kg (164 lb 3.2 oz)  - SpO2 97%  - BMI 25.32 kg/m??      Gen: - awake, alert, in NAD  Derm: - well-healing surgical scars s/p BOLT, right sided chest tube sites healing very well, left sided chest tube sites healing well but remain scarred over  Vascular: - good pulses throughout,   CV: - RRR, no m/r/g  Pulm: - Normal symmetric excursion, Normal WOB on RA. Good air movement bilaterally, without wheezes or crackles including with forced expiraiton. Normal upper airway sounds without evidence of stridor  Abd: - soft, NT, ND, +BS, no hepatosplenomegally  Ext: - no edema of LE, no clubbing  Skin: mild redness over anterior chest, no warmth or TTP   Neuro: - Alert, oriented, follows commands. Moving all extremities spontaneously    LABS: (reviewed in Epic, pertinent values noted below)     PFTs:  (personally reveiewed and interpreted):    Date: FVC (% Pred) FEV1 (% Pred) FEF25-75(% Pred) DLCO TBBx/Results                                                   04/06/2021 4.08 (111%) 3.64 (129.6%) 6.01 (274.3%)     12/31/20 3.95 (107.1%) 3.46 (122.6%) 5.20 (236.4%)     12/03/2020 3.87 (104.9%) 3.47 (122.9%) 5.69 (258.3%)     10/03/2020 3.68 (99.4) 3.13 (110.6)  4.22 (190.6)      09/05/20  3.70  3.14      08/20/20 3.65 (98%) 3.15 (111%) 4.69 (211%)      08/01/20 3.64 (98.2%) 3.10 (109.3%) 4.41 (198.8%)  07/18/20 3.49 (94%) 2.98 (105%) 4.20 (189%)     07/04/20 3.43 (92.5%) 2.97 (104.7%) 4.26 (191.7%)     06/27/2020 3.13 84.5 2.70 95.4 3.51 157.8     06/20/20 3.39 (91.4%) 2.82 (99.2%) 3.34 (150.35)     06/13/20 3.10 (72.8%) 2.66 (82.5%) 3.34 (136.2%)      05/15/20      BOLT    03/11/20  2.75 (72%)  2.36 (81%)       02/12/20  2.85 (75%)  2.44 (86%)                IMAGING: (Personally reviewed, our interpretation is below)  CXR 08/20/20: stable blunting of R costophrenic angle with elevated R hemidiaphragm. 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 consider envarsus  - Imuran 50 mg daily; dropped from 100 mg 08/01/20 given good PFT's, age, renal fx and total IgG.     --> Stopped MMF 06/13/20 (GI upset).    - Prednisone 10mg  daily (decreased last visit)  - Azithro BOS ppx, aspirin 81 mg daily  - Restarted prava 40mg  QD   - Start Advair today (not on Montelukast, but on Azithro)    Antimicrobial Prophylaxis:  - CMV, high risk (D+/R-): valganciclovir 450mg  daily for Estimated Creatinine Clearance: 42.8 mL/min (A) (based on SCr of 1.53 mg/dL (H)). Negative 06/20/20  - EBV, (D+/R+): negative 06/20/20  - Fungal: Posaconazole stopped at 84mo  - PCP: Bactrim SS MWF   - tamiflu during flu season, stopped     Pain control:  - Moderately well controlled  - Previously on gabapentin 100mg  TID, now off  - Tylenol TID prn    Concern for OSA: STOP-BANG 4 points (male, age, observed apnea, snoring)  - Continue CPAP    Loose stools- Improved with switch from MMF to imuran  - Encouraged him to avoid sugar free foods as this exacerbates symptoms.  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  - pH probe with DeMeester 16.7%, slightly in abnormal range but will hold off on surgical intervention at this time in the setting of normal graft function.    --> On daily PPI    BPH  - Increased terazosin to 2mg  nightly to help symptoms and BP, and symptoms have resolved... increase to 4mg  as posa washing out     Blood Pressure Management:  - Home BPs stable  - Not currently on therapy    Kidney Function:  - Cr baseline 0.8-1.13  - Creatinine 1.3-1.5 most recently  - Lasix on hold, no increase in LE edema    Iron Deficiency Anemia:   -  received IV iron 05/22/20, continue to be low    Hypomagnesemia: Continue to be low  - Increase 800 mg BID to 1200 mg BID today  ??  Antiarrythmic Prophylaxis:   - finished amiodarone without arrhythmia.     Type 2 diabetes mellitus   - Lantus will drop to 12 units in the nightly with pred adjustment  - aspart 7/7/3 with pred adjustment  - metformin 1000mg  BID  - sugars well controlled, apart from some lower morning sugars (16-109)    Bone Health: Last Dexa: Oct 2021, no osteopenia or osteoporosis  - on Ca and Vit D supplementation currently, last Vit D 27 on 05/24/20    Health Maintenance:  - Last derm visit:  Pre-txp,   - Last Colonsocopy (if >40): 12/2016 Diverticulosis and internal hemorrhoids  - Last flu shot:   03/2021, will give booster at this visit  - Last Pneumovax:  06/10/2004, 12/30/2017  - Last Prevnar:  03/25/2016  - TdaP:   02/11/2018  - Shingrix:    03/10/20, 08/20/20  - COVID-19 (Moderna) 06/11/19, 07/09/19, 03/10/20, 07/18/2020, 12/03/20 received 1/2 dose Evushield,     03/2021 (Bivalent)   - Last Dexa:   02/2020, will need DEXA at 44yr  - Last vitamin D:  51.2 (07/18/2020)  - Last HbA1c:   6.0 (06/13/2020), repeat pending.    Immunization History   Administered Date(s) Administered   ??? COVID-19 VACCINE,MRNA(MODERNA)(PF) 06/11/2019, 07/09/2019, 03/10/2020, 07/18/2020, 12/03/2020   ??? HEPB-CPG,ADULT DOSAGE ADJUVANTED, IM 03/12/2020   ??? INFLUENZA INJ MDCK PF, QUAD,(FLUCELVAX)(15MO AND UP EGG FREE) 02/08/2020   ??? INFLUENZA TIV (TRI) PF (IM) 02/27/2010   ??? Influenza Vaccine Quad (IIV4 PF) 61mo+ injectable 01/19/2019   ??? Influenza Virus Vaccine, unspecified formulation 02/07/2013, 02/28/2015, 02/05/2016, 02/02/2017, 01/27/2018   ??? PNEUMOCOCCAL POLYSACCHARIDE 23 06/10/2004, 12/30/2017   ??? Pneumococcal Conjugate 13-Valent 03/25/2016   ??? SHINGRIX-ZOSTER VACCINE (HZV), RECOMBINANT,SUB-UNIT,ADJUVANTED IM 03/10/2020, 08/20/2020   ??? TdaP 02/15/2012, 02/11/2018       >58min was spent with the patient face to face and >90min was spent reviewing chart/imaging.     Complex medical decision making was done as we adjust medications based on blood work/drug levels and multiple complaints and problems were addressed    The patient was assessed and discussed with Dr. Glenard Haring who is in agreement with plan.    Hilda Blades, MD  [facility] Pulmonary & Critical Care Fellow, PGY-4  04/06/2021

## 2021-04-07 LAB — EBV QUANTITATIVE PCR, BLOOD: EBV VIRAL LOAD RESULT: NOT DETECTED

## 2021-04-07 LAB — CMV DNA, QUANTITATIVE, PCR: CMV VIRAL LD: NOT DETECTED

## 2021-04-14 MED FILL — AZITHROMYCIN 250 MG TABLET: ORAL | 30 days supply | Qty: 30 | Fill #10

## 2021-04-14 MED FILL — CALCIUM CARBONATE 600 MG CALCIUM (1,500 MG) TABLET: ORAL | 30 days supply | Qty: 60 | Fill #10

## 2021-04-14 MED FILL — MAGNESIUM OXIDE 400 MG (241.3 MG MAGNESIUM) TABLET: ORAL | 30 days supply | Qty: 180 | Fill #6

## 2021-04-14 MED FILL — METFORMIN 1,000 MG TABLET: ORAL | 90 days supply | Qty: 180 | Fill #2

## 2021-04-14 MED FILL — PRAVASTATIN 40 MG TABLET: ORAL | 30 days supply | Qty: 30 | Fill #6

## 2021-04-15 LAB — FSAB CLASS 2 ANTIBODY SPECIFICITY: HLA CL2 AB RESULT: NEGATIVE

## 2021-04-15 LAB — HLA DS POST TRANSPLANT
ANTI-DONOR DRW #1 MFI: 0 MFI
ANTI-DONOR DRW #2 MFI: 0 MFI
ANTI-DONOR HLA-A #1 MFI: 0 MFI
ANTI-DONOR HLA-A #2 MFI: 0 MFI
ANTI-DONOR HLA-B #1 MFI: 0 MFI
ANTI-DONOR HLA-B #2 MFI: 0 MFI
ANTI-DONOR HLA-C #1 MFI: 0 MFI
ANTI-DONOR HLA-C #2 MFI: 0 MFI
ANTI-DONOR HLA-DQB #1 MFI: 0 MFI
ANTI-DONOR HLA-DQB #2 MFI: 0 MFI
ANTI-DONOR HLA-DR #1 MFI: 0 MFI
ANTI-DONOR HLA-DR #2 MFI: 0 MFI

## 2021-04-15 LAB — FSAB CLASS 1 ANTIBODY SPECIFICITY: HLA CLASS 1 ANTIBODY RESULT: NEGATIVE

## 2021-04-29 NOTE — Unmapped (Signed)
Patient called in and added on call test strips to this order.

## 2021-04-29 NOTE — Unmapped (Signed)
[facility] Refill Coordination Note    Specialty Medication(s) to be Shipped:   Transplant: valgancyclovir 450mg  and Prednisone 5mg     Other medication(s) to be shipped: vitamin d3, bactrim, terazosin     Spencer Pena Sr., DOB: 06-Nov-1950  Phone: [phone] (home) [phone] (work)      All above HIPAA information was verified with patient.     Was a Nurse, learning disability used for this call? No    Completed refill call assessment today to schedule patient's medication shipment from the [facility] [phone]).  All relevant notes have been reviewed.     Specialty medication(s) and dose(s) confirmed: Regimen is correct and unchanged.   Changes to medications: Janai reports no changes at this time.  Changes to insurance: No  New side effects reported not previously addressed with a pharmacist or physician: None reported  Questions for the pharmacist: No    Confirmed patient received a Conservation officer, historic buildings and a Surveyor, mining with first shipment. The patient will receive a drug information handout for each medication shipped and additional FDA Medication Guides as required.       DISEASE/MEDICATION-SPECIFIC INFORMATION        N/A    SPECIALTY MEDICATION ADHERENCE     Medication Adherence    Patient reported X missed doses in the last month: 0  Specialty Medication: Valganciclovir 450mg   Patient is on additional specialty medications: Yes  Additional Specialty Medications: Prednisone  Patient Reported Additional Medication X Missed Doses in the Last Month: 0  Patient is on more than two specialty medications: No  Any gaps in refill history greater than 2 weeks in the last 3 months: no  Demonstrates understanding of importance of adherence: yes  Informant: patient              Were doses missed due to medication being on hold? No    Prednisone 5mg : Patient has 10 days of medication on hand  Valganciclovir 450mg : Patient has 10 days of medication on hand    REFERRAL TO PHARMACIST Referral to the pharmacist: Not needed      [facility]     Shipping address confirmed in Epic.     Delivery Scheduled: Yes, Expected medication delivery date: 12/28.     Medication will be delivered via Next Day Courier to the prescription address in Epic WAM.    Olga Millers   [facility] Specialty Technician

## 2021-05-04 MED FILL — PREDNISONE 5 MG TABLET: ORAL | 30 days supply | Qty: 60 | Fill #4

## 2021-05-04 MED FILL — SULFAMETHOXAZOLE 400 MG-TRIMETHOPRIM 80 MG TABLET: ORAL | 84 days supply | Qty: 36 | Fill #1

## 2021-05-04 MED FILL — TERAZOSIN 1 MG CAPSULE: ORAL | 30 days supply | Qty: 90 | Fill #8

## 2021-05-04 MED FILL — ON CALL EXPRESS TEST STRIP: 20 days supply | Qty: 100 | Fill #9

## 2021-05-04 MED FILL — CHOLECALCIFEROL (VITAMIN D3) 25 MCG (1,000 UNIT) TABLET: ORAL | 50 days supply | Qty: 100 | Fill #6

## 2021-05-04 MED FILL — VALGANCICLOVIR 450 MG TABLET: ORAL | 30 days supply | Qty: 30 | Fill #10

## 2021-05-05 DIAGNOSIS — Z942 Lung transplant status: Principal | ICD-10-CM

## 2021-05-06 ENCOUNTER — Ambulatory Visit: Admit: 2021-05-06 | Discharge: 2021-05-07 | Payer: MEDICARE

## 2021-05-07 MED ORDER — OSELTAMIVIR 30 MG CAPSULE
ORAL_CAPSULE | Freq: Every day | ORAL | 3 refills | 30 days | Status: CP
Start: 2021-05-07 — End: ?
  Filled 2021-05-08: qty 30, 30d supply, fill #0

## 2021-05-07 NOTE — Unmapped (Signed)
Outgoing call to [employer] Sr[facility] to discuss rpp results.  Has a coronavirus.  Advised to take mucinex and drink plenty of water. Questions answered, verbalized understanding.

## 2021-05-08 NOTE — Unmapped (Signed)
Canceled surgery case for Spencer Pena. Patient still undecided if he wants to proceed. He will call me once he is ready and I will re-open the case.

## 2021-05-13 DIAGNOSIS — J42 Unspecified chronic bronchitis: Principal | ICD-10-CM

## 2021-05-13 MED ORDER — CALCIUM CARBONATE 600 MG CALCIUM (1,500 MG) TABLET
ORAL_TABLET | Freq: Two times a day (BID) | ORAL | 11 refills | 30 days | Status: CP
Start: 2021-05-13 — End: ?
  Filled 2021-05-14: qty 60, 30d supply, fill #0

## 2021-05-13 MED ORDER — AZITHROMYCIN 250 MG TABLET
ORAL_TABLET | Freq: Every day | ORAL | 11 refills | 30 days | Status: CP
Start: 2021-05-13 — End: ?
  Filled 2021-05-14: qty 30, 30d supply, fill #0

## 2021-05-14 MED FILL — PRAVASTATIN 40 MG TABLET: ORAL | 30 days supply | Qty: 30 | Fill #7

## 2021-05-14 MED FILL — MAGNESIUM OXIDE 400 MG (241.3 MG MAGNESIUM) TABLET: ORAL | 30 days supply | Qty: 180 | Fill #7

## 2021-05-15 ENCOUNTER — Encounter: Admit: 2021-05-15 | Discharge: 2021-05-15 | Payer: MEDICARE | Attending: Internal Medicine | Primary: Internal Medicine

## 2021-05-15 ENCOUNTER — Ambulatory Visit: Admit: 2021-05-15 | Discharge: 2021-05-15 | Payer: MEDICARE | Attending: Registered" | Primary: Registered"

## 2021-05-15 ENCOUNTER — Ambulatory Visit: Admit: 2021-05-15 | Discharge: 2021-05-15 | Payer: MEDICARE | Attending: Internal Medicine | Primary: Internal Medicine

## 2021-05-15 ENCOUNTER — Ambulatory Visit: Admit: 2021-05-15 | Discharge: 2021-05-15 | Payer: MEDICARE

## 2021-05-15 ENCOUNTER — Institutional Professional Consult (permissible substitution): Admit: 2021-05-15 | Discharge: 2021-05-15 | Payer: MEDICARE

## 2021-05-15 DIAGNOSIS — Z942 Lung transplant status: Principal | ICD-10-CM

## 2021-05-15 DIAGNOSIS — Z0000 Encounter for general adult medical examination without abnormal findings: Principal | ICD-10-CM

## 2021-05-15 LAB — CBC W/ AUTO DIFF
BASOPHILS ABSOLUTE COUNT: 0 10*9/L (ref 0.0–0.1)
BASOPHILS RELATIVE PERCENT: 0.7 %
EOSINOPHILS ABSOLUTE COUNT: 0 10*9/L (ref 0.0–0.5)
EOSINOPHILS RELATIVE PERCENT: 0.8 %
HEMATOCRIT: 37.6 % — ABNORMAL LOW (ref 39.0–48.0)
HEMOGLOBIN: 12.5 g/dL — ABNORMAL LOW (ref 12.9–16.5)
LYMPHOCYTES ABSOLUTE COUNT: 0.6 10*9/L — ABNORMAL LOW (ref 1.1–3.6)
LYMPHOCYTES RELATIVE PERCENT: 14.3 %
MEAN CORPUSCULAR HEMOGLOBIN CONC: 33.2 g/dL (ref 32.0–36.0)
MEAN CORPUSCULAR HEMOGLOBIN: 31.1 pg (ref 25.9–32.4)
MEAN CORPUSCULAR VOLUME: 93.8 fL (ref 77.6–95.7)
MEAN PLATELET VOLUME: 7.2 fL (ref 6.8–10.7)
MONOCYTES ABSOLUTE COUNT: 0.2 10*9/L — ABNORMAL LOW (ref 0.3–0.8)
MONOCYTES RELATIVE PERCENT: 5.4 %
NEUTROPHILS ABSOLUTE COUNT: 3.1 10*9/L (ref 1.8–7.8)
NEUTROPHILS RELATIVE PERCENT: 78.8 %
PLATELET COUNT: 189 10*9/L (ref 150–450)
RED BLOOD CELL COUNT: 4.01 10*12/L — ABNORMAL LOW (ref 4.26–5.60)
RED CELL DISTRIBUTION WIDTH: 15.1 % (ref 12.2–15.2)
WBC ADJUSTED: 3.9 10*9/L (ref 3.6–11.2)

## 2021-05-15 LAB — PSA: PROSTATE SPECIFIC ANTIGEN: 1.27 ng/mL (ref 0.00–4.00)

## 2021-05-15 LAB — COMPREHENSIVE METABOLIC PANEL
ALBUMIN: 3.8 g/dL (ref 3.4–5.0)
ALKALINE PHOSPHATASE: 91 U/L (ref 46–116)
ALT (SGPT): 32 U/L (ref 10–49)
ANION GAP: 9 mmol/L (ref 5–14)
AST (SGOT): 32 U/L (ref <=34)
BILIRUBIN TOTAL: 0.4 mg/dL (ref 0.3–1.2)
BLOOD UREA NITROGEN: 39 mg/dL — ABNORMAL HIGH (ref 9–23)
BUN / CREAT RATIO: 27
CALCIUM: 9.9 mg/dL (ref 8.7–10.4)
CHLORIDE: 105 mmol/L (ref 98–107)
CO2: 29 mmol/L (ref 20.0–31.0)
CREATININE: 1.47 mg/dL — ABNORMAL HIGH
EGFR CKD-EPI (2021) MALE: 51 mL/min/{1.73_m2} — ABNORMAL LOW (ref >=60)
GLUCOSE RANDOM: 106 mg/dL — ABNORMAL HIGH (ref 70–99)
POTASSIUM: 4.3 mmol/L (ref 3.5–5.1)
PROTEIN TOTAL: 6.8 g/dL (ref 5.7–8.2)
SODIUM: 143 mmol/L (ref 135–145)

## 2021-05-15 LAB — LIPID PANEL
CHOLESTEROL/HDL RATIO SCREEN: 2.8 (ref 1.0–4.5)
CHOLESTEROL: 110 mg/dL (ref <=200)
HDL CHOLESTEROL: 39 mg/dL — ABNORMAL LOW (ref 40–60)
LDL CHOLESTEROL CALCULATED: 50 mg/dL (ref 40–99)
NON-HDL CHOLESTEROL: 71 mg/dL (ref 70–130)
TRIGLYCERIDES: 103 mg/dL (ref 0–150)
VLDL CHOLESTEROL CAL: 20.6 mg/dL (ref 12–42)

## 2021-05-15 LAB — PHOSPHORUS: PHOSPHORUS: 3.3 mg/dL (ref 2.4–5.1)

## 2021-05-15 LAB — MAGNESIUM: MAGNESIUM: 1.7 mg/dL (ref 1.6–2.6)

## 2021-05-15 LAB — IGG: GAMMAGLOBULIN; IGG: 586 mg/dL — ABNORMAL LOW (ref 646–2013)

## 2021-05-15 LAB — TACROLIMUS LEVEL, TROUGH: TACROLIMUS, TROUGH: 6.6 ng/mL (ref 5.0–15.0)

## 2021-05-15 MED ORDER — MONTELUKAST 10 MG TABLET
ORAL_TABLET | Freq: Every day | ORAL | 2 refills | 30.00000 days | Status: CP
Start: 2021-05-15 — End: 2022-05-15

## 2021-05-15 MED ORDER — PREDNISONE 5 MG TABLET
ORAL_TABLET | Freq: Every day | ORAL | 11 refills | 40 days | Status: CP
Start: 2021-05-15 — End: ?

## 2021-05-15 NOTE — Unmapped (Signed)
[facility] Outpatient Nutrition Services   Lung Transplant Assessment/Follow-up     Visit Type:    Return Assessment    Referral Reason:   One year post-transplant follow-up    Referral Diagnosis:   Lung replaced by transplant (CMS-HCC)    [name]. is a 71 y.o. male seen for medical nutrition therapy for Lung transplant follow-up. His active problem list, medication list, allergies, notes from last several encounters, lab results, endoscopy procedure notes and imaging were reviewed.     Nutrition History:   Appetite been good. Has been eating   B - bacon or sausage with egg and grits or potatoes and onions; waffles with bacon; some cereal  L - fried pork chops, chicken, corn, green beans, butter beans, ham, sweet potato, pasta  D - bananas and apples, butter cookie, cheese  Snacks - none  Beverages - water, unsweet with stevia decaf tea, diet soda, occasional black coffee    Anthropometrics   Height: 167.9 cm (5' 6.1)   Weight: 74.8 kg (164 lb 14.5 oz)  body mass index is 26.53 kg/m??.   Ideal Body Weight: 64.77 kg  % Ideal Body Weight: 115%  Adjusted Body Weight: n/a     Wt Readings from Last 10 Encounters:   05/20/21 74.8 kg (164 lb 14.5 oz)   05/15/21 74.8 kg (165 lb)   04/06/21 74.5 kg (164 lb 3.2 oz)   02/03/21 75 kg (165 lb 6.4 oz)   12/31/20 75.2 kg (165 lb 11.2 oz)   12/03/20 74.2 kg (163 lb 8 oz)   12/03/20 74.2 kg (163 lb 8 oz)   10/03/20 72.1 kg (159 lb)   10/03/20 72.1 kg (159 lb)   09/05/20 69.9 kg (154 lb)       Nutrition Risk Screening:   Nutrition Focused Physical Exam:                Nutrition Evaluation  Overall Impressions: Nutrition-Focused Physical Exam not indicated due to lack of malnutrition risk factors. (05/20/21 1524)     Malnutrition Screening:   Patient does not meet AND/ASPEN criteria for malnutrition at this time (05/20/21 1524)        Biochemical Data, Medical Tests and Procedures:  All pertinent labs and imaging reviewed by Lillia Corporal at 10:04 AM 05/20/2021.    Lab Results   Component Value Date    Hemoglobin A1C 5.1 12/03/2020    Hemoglobin A1C 6.0 (H) 06/13/2020    Hemoglobin A1C 6.5 (H) 05/15/2020    Hemoglobin A1C 7.1 (A) 12/19/2015    Hemoglobin A1C 7.1 (A) 12/18/2014    Hemoglobin A1C 7.4 (H) 07/26/2014    Hemoglobin A1C 7.1 (H) 01/02/2014    Hemoglobin A1C 6.5 (H) 03/23/2013      Lab Results   Component Value Date    Vitamin D Total (25OH) 66.9 05/15/2021    Vitamin D Total (25OH) 51.2 07/18/2020    Vitamin D Total (25OH) 27.0 05/24/2020     Medications and Vitamin/Mineral Supplementation:   All nutritionally pertinent medications reviewed on 05/20/2021.   Nutritionally pertinent medications include: as below  He is taking nutrition supplements. Member[employer] High Protein (160 kcals and 30 grams protein) consumes 1 or 2 per day    Current Outpatient Medications   Medication Sig Dispense Refill   ??? acetaminophen (TYLENOL EXTRA STRENGTH) 500 MG tablet Take 1-2 tablets (500-1,000 mg total) by mouth every six (6) hours as needed for pain. 180 tablet PRN   ???  aspirin (ECOTRIN) 81 MG tablet Take 1 tablet (81 mg total) by mouth daily. 90 tablet 3   ??? azaTHIOprine (IMURAN) 50 mg tablet Take 1 tablet (50 mg total) by mouth daily. 90 tablet 3   ??? azithromycin (ZITHROMAX) 250 MG tablet Take 1 tablet (250 mg total) by mouth daily. 30 tablet 11   ??? blood sugar diagnostic (ON CALL EXPRESS TEST STRIP) Strp Test daily before all meals/snacks and once before bedtime. 100 each 11   ??? calcium carbonate (CALCIUM 600) 1,500 mg (600 mg elem calcium) tablet Take 1 tablet (600 mg of elem calcium total) by mouth Two (2) times a day. 60 tablet 11   ??? cholecalciferol, vitamin D3 25 mcg, 1,000 units,, 1,000 unit (25 mcg) tablet Take 2 tablets (50 mcg total) by mouth daily. 100 tablet 11   ??? fluticasone propion-salmeteroL (ADVAIR HFA) 115-21 mcg/actuation inhaler Inhale 2 puffs Two (2) times a day. 12 g 11   ??? insulin ASPART (NOVOLOG FLEXPEN U-100 INSULIN) 100 unit/mL (3 mL) injection pen Inject 7 units with breakfast (10 units with high sugar breakfast), 7 units with afternoon meal, and 3 units with evening snack plus sliding scale 2 units for every 50 >150; max 50 units/day 15 mL 2   ??? insulin glargine (BASAGLAR, LANTUS) 100 unit/mL (3 mL) injection pen Inject 0.12 mL (12 Units total) under the skin nightly. 15 mL 3   ??? lancets 30 gauge Misc Test daily before all meals/snacks and once before bedtime. 100 each 11   ??? magnesium oxide (MAG-OX) 400 mg (241.3 mg elemental magnesium) tablet Take 3 tablets (1,200 mg total) by mouth Two (2) times a day. 180 tablet 11   ??? metFORMIN (GLUCOPHAGE) 1000 MG tablet Take 1 tablet (1,000 mg total) by mouth Two (2) times a day. 180 tablet 3   ??? montelukast (SINGULAIR) 10 mg tablet Take 1 tablet (10 mg total) by mouth daily. 30 tablet 2   ??? oseltamivir (TAMIFLU) 30 MG capsule Take 1 capsule (30 mg total) by mouth daily. 30 capsule 3   ??? pantoprazole (PROTONIX) 40 MG tablet Take 1 tablet (40 mg total) by mouth daily. 90 tablet 3   ??? pen needle, diabetic (BD ULTRA-FINE NANO PEN NEEDLE) 32 gauge x 5/32 (4 mm) Ndle Use as directed for injections five (5) times a day. 150 each 11   ??? pravastatin (PRAVACHOL) 40 MG tablet Take 1 tablet (40 mg total) by mouth daily. 30 tablet 11   ??? predniSONE (DELTASONE) 5 MG tablet Take 1.5 tablets (7.5 mg total) by mouth daily. 60 tablet 11   ??? sulfamethoxazole-trimethoprim (BACTRIM) 400-80 mg per tablet Take 1 tablet (80 mg of trimethoprim total) by mouth Every Monday, Wednesday, and Friday. 36 tablet 3   ??? tacrolimus (PROGRAF) 0.5 MG capsule Take 1 capsule (0.5 mg total) by mouth nightly. Take 1 mg capsule in morning (1 mg AM / 0.5 mg PM) 90 capsule 3   ??? tacrolimus (PROGRAF) 1 MG capsule Take 1 capsule (1 mg total) by mouth in the morning. Take 0.5 mg capsule in evening. (1 mg AM / 0.5 mg PM). 90 capsule 3   ??? terazosin (HYTRIN) 1 MG capsule Take 3 capsules (3 mg total) by mouth nightly. 90 capsule 11   ??? valGANciclovir (VALCYTE) 450 mg tablet Take 1 tablet (450 mg total) by mouth daily. 30 tablet 11     No current facility-administered medications for this visit.     Daily Estimated Nutritional Needs:  Energy: 1610-9604  kcals [25-30 kcal/kg using last recorded weight, 75 kg (05/20/21 1524)]  Protein: 90-113 gm [1.2-1.5 gm/kg using  , 75 kg (05/20/21 1524)]  Fluid: 2625 [35 mL/kg]    Nutrition Goals & Evaluation    1. Meet estimated daily needs  (Ongoing)  2. Prevention/improvment of malnutrition -  (Ongoing)    Nutrition goals reviewed, and relevant barriers identified and addressed: none evident. He is evaluated to have excellent willingness and ability to achieve nutrition goals.     Nutrition Assessment     Patient presents for follow-up today on his one year transplant anniversary. Overall patient's  Appetite is good and his weight is stable at this time. One year education completed.     Nutrition Intervention      - Meals and Snacks    Nutrition Plan:   ??? Add protein with evening meal (eggs, cheese, peanut butter, deli meat, protein shake)  ??? At this time may liberalize your diet to the following:  At this time you can:  ??? Eat all well-washed fruits and vegetables with the exception of grapefruit and pomegranate  ??? Eat cold lunch meats, deli meats and cold cuts  ??? Eat raw fruits and vegetables outside the home  ??? Use ice makers and beverage dispensers outside the home  ??? Eat medium rare or rare steaks   ??? Eat chicken salad, tuna salad, egg salad made in a restaurant or purchased from a store  ??? Have eggs with runny yolks (over easy, over medium, etc.)  ??? Have blue cheeses like gorgonzola, bleu cheese, stilton and Roquefort     Please remember that you should still avoid:  ??? Buffets or any food that is obtained via the public serving themselves  ??? Raw fish  ??? Burgers not cooked well-done  ??? Any shared condiments on tables in restaurants (just continue to ask for packets)    Please continue to:  ??? Use a meat thermometer when cooking to ensure proper cooking temperature  ??? Use separated cutting boards (one for meat, fish, pork, chicken, [place], etc; and one for fruits and vegetables)  ??? Only defrost foods in the refrigerator or microwave  ??? Not rinse raw meat or chicken to avoid contaminating the faucet and surrounding surfaces  ??? Avoid consuming any well water unless it is boiled     Follow up: as needed and with Nolon Nations, RD    Food/Nutrition-related history, Anthropometric measurements, Biochemical data, medical tests, procedures, Nutrition-focused physical findings and Effectiveness of nutrition interventions will be assessed at time of follow-up.     Recommendations for Care Team :  None     Time spent 17 minutes     Nolon Nations, MS, RD, LD, CCTD  Heart/Lung Transplant, VAD Dietitian  pgr [phone]; ph [phone]

## 2021-05-15 NOTE — Unmapped (Signed)
In with Dr. Dudley Major to see Spencer Pena. Doing well, PFTs stable.  Will get CMV T cell immunity panel today.  Decrease prednisone to 7.5.  Return to clinic in 3 months.  Questions answered, verbalized understanding.

## 2021-05-15 NOTE — Unmapped (Signed)
See Pharmacy encounter in clinic this day for additional documentation.

## 2021-05-15 NOTE — Unmapped (Signed)
Called [name] to discuss lab results. Tacrolimus level was normal at 6.6 with a goal of 7-9. Medication regimen is currently 1/0.5. Discussed with [name], CPP, dosing at this time to remain the same. Repeat labs scheduled monthly. Resulted labs reviewed. All questions answered. Pt verbalized understanding.

## 2021-05-15 NOTE — Unmapped (Signed)
Patient placed on contact/droplet isolation precaution.  Isolation gown and gloves used with direct patient contact.

## 2021-05-15 NOTE — Unmapped (Signed)
[facility] CLINIC PHARMACY NOTE  05/15/2021   Spencer MACGREGOR Sr.  [id number]    Medication changes today:   1. Decrease prednisone to 7.5 mg daily  2. Start montelukast 10 mg daily for CLAD px  3. Begin tamiflu 75 mg daily for flu px    Education/Adherence tools provided today:  1. provided updated medication list    Follow up items:  1. CMV T-cell immunity assay for further valcyte therapy  2. Reassess inhaler technique  3. Vit D level pending today    Next visit with pharmacy in 1-3 months  ____________________________________________________________________    Spencer Mallet Sr. is a 71 y.o. 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     Other PMH significant for hypertension, T2DM, colon cancer s/p hemicolectomy and R hepatectomy for metastasis + chemotherapy (2007), BPH    Seen by pharmacy today for: medication management and  pill box assessment and adherence education.     CC:  Patient has no complaints today      There were no vitals filed for this visit.    Allergies   Allergen Reactions   ??? Cetuximab Anaphylaxis     Chest pain/rapid heart rate/ BP dropped   ??? Nsaids (Non-Steroidal Anti-Inflammatory Drug)      Interaction with transplant medications   ??? Venom-Honey Bee Anaphylaxis   ??? Enalapril      Angioedema     ??? Pollen Extracts Other (See Comments)     Nasal stuffiness, coughing, sneezing     Medications reviewed in EPIC medication station and updated today by the clinical pharmacist practitioner. Medication list includes revisions made during today???s encounter.    Outpatient Encounter Medications as of 05/15/2021   Medication Sig Dispense Refill   ??? acetaminophen (TYLENOL EXTRA STRENGTH) 500 MG tablet Take 1-2 tablets (500-1,000 mg total) by mouth every six (6) hours as needed for pain. 180 tablet PRN   ??? aspirin (ECOTRIN) 81 MG tablet Take 1 tablet (81 mg total) by mouth daily. 90 tablet 3   ??? azaTHIOprine (IMURAN) 50 mg tablet Take 1 tablet (50 mg total) by mouth daily. 90 tablet 3   ??? azithromycin (ZITHROMAX) 250 MG tablet Take 1 tablet (250 mg total) by mouth daily. 30 tablet 11   ??? blood sugar diagnostic (ON CALL EXPRESS TEST STRIP) Strp Test daily before all meals/snacks and once before bedtime. 100 each 11   ??? calcium carbonate (CALCIUM 600) 1,500 mg (600 mg elem calcium) tablet Take 1 tablet (600 mg of elem calcium total) by mouth Two (2) times a day. 60 tablet 11   ??? cholecalciferol, vitamin D3 25 mcg, 1,000 units,, 1,000 unit (25 mcg) tablet Take 2 tablets (50 mcg total) by mouth daily. 100 tablet 11   ??? fluticasone propion-salmeteroL (ADVAIR HFA) 115-21 mcg/actuation inhaler Inhale 2 puffs Two (2) times a day. 12 g 11   ??? insulin ASPART (NOVOLOG FLEXPEN U-100 INSULIN) 100 unit/mL (3 mL) injection pen Inject 7 units with breakfast (10 units with high sugar breakfast), 7 units with afternoon meal, and 3 units with evening snack plus sliding scale 2 units for every 50 >150; max 50 units/day 15 mL 2   ??? insulin glargine (BASAGLAR, LANTUS) 100 unit/mL (3 mL) injection pen Inject 0.12 mL (12 Units total) under the skin nightly. 15 mL 3   ??? lancets 30 gauge Misc Test daily before all meals/snacks and once before bedtime. 100 each 11   ??? magnesium oxide (  MAG-OX) 400 mg (241.3 mg elemental magnesium) tablet Take 3 tablets (1,200 mg total) by mouth Two (2) times a day. 180 tablet 11   ??? metFORMIN (GLUCOPHAGE) 1000 MG tablet Take 1 tablet (1,000 mg total) by mouth Two (2) times a day. 180 tablet 3   ??? oseltamivir (TAMIFLU) 30 MG capsule Take 1 capsule (30 mg total) by mouth daily. 30 capsule 3   ??? pantoprazole (PROTONIX) 40 MG tablet Take 1 tablet (40 mg total) by mouth daily. 90 tablet 3   ??? pen needle, diabetic (BD ULTRA-FINE NANO PEN NEEDLE) 32 gauge x 5/32 (4 mm) Ndle Use as directed for injections five (5) times a day. 150 each 11   ??? pravastatin (PRAVACHOL) 40 MG tablet Take 1 tablet (40 mg total) by mouth daily. 30 tablet 11   ??? predniSONE (DELTASONE) 5 MG tablet Take 2 tablets (10 mg total) by mouth in the morning. 60 tablet 11   ??? sulfamethoxazole-trimethoprim (BACTRIM) 400-80 mg per tablet Take 1 tablet (80 mg of trimethoprim total) by mouth Every Monday, Wednesday, and Friday. 36 tablet 3   ??? tacrolimus (PROGRAF) 0.5 MG capsule Take 1 capsule (0.5 mg total) by mouth nightly. Take 1 mg capsule in morning (1 mg AM / 0.5 mg PM) 90 capsule 3   ??? tacrolimus (PROGRAF) 1 MG capsule Take 1 capsule (1 mg total) by mouth in the morning. Take 0.5 mg capsule in evening. (1 mg AM / 0.5 mg PM). 90 capsule 3   ??? terazosin (HYTRIN) 1 MG capsule Take 3 capsules (3 mg total) by mouth nightly. 90 capsule 11   ??? valGANciclovir (VALCYTE) 450 mg tablet Take 1 tablet (450 mg total) by mouth daily. 30 tablet 11     No facility-administered encounter medications on file as of 05/15/2021.       Immunosuppression:  Induction agent: basiliximab    Current immunosuppression:  ??? tacrolimus 1mg  AM/0.5 mg PM  o Prograf goal: 7-9  ??? Azathioprine 50 mg daily   o changed from MMF 2/2 diarrhea while inpatient; dose decreased from 100mg  to 75mg  on 08/01/20 given downtrending ANC and IgG; dose decrease to 50mg  on 10/03/20  ??? prednisone 10 mg daily    Patient is tolerating immunosuppression well     IMMUNOSUPPRESSION DRUG LEVELS:  Lab Results   Component Value Date    Tacrolimus, Trough 6.6 05/15/2021    Tacrolimus, Trough 7.1 04/06/2021    Tacrolimus, Trough 8.1 03/16/2021     Tacrolimus level is accurate 12 hour trough.    Graft function assessment via PFTs: stable  DSA: NTD  Biopsies to date: none  WBC/ANC:  wnl      Plan: Will decrease prednisone to 7.5 mg day, since he is a year out from transplant     OI prophylaxis:   CMV Status: D+/ R-, high risk. CMV prophylaxis with valganciclovir 450 mg daily x 12 months per protocol.  Estimated Creatinine Clearance: 43.7 mL/min (A) (based on SCr of 1.47 mg/dL (H)).  PCP: Prophylaxis with bactrim SS 1 tab MWF indefinitely.  Fungal: Completed posaconazole 300 mg daily for prophylaxis x 3 months per protocol - COMPLETE (08/20/20)  Flu (seasonal): oseltamivir (Tamiflu) 75 mg every other day - completed    Patient is tolerating infectious prophylaxis well.     Plan: Continuing valcyte prophylaxis while waiting on CMV T-cell assay results. Restarted tamiflu prophylaxis for the current flu season.    Infectious History:  Patient does not have a history of pathogenic  infectious complications following lung transplant. 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 Previous antiinfective history use includes meropenem x7 days for broad spectrum gram negative and aspiration PNA coverage post-op per ICID.     Plan: Continue to monitor.    BOS/CLAD prophylaxis:  Current meds include: azithromycin 250 mg daily and pantoprazole 40 mg daily, advair HFA BID  Esophageal motility study on 07/23/20 showed abnormal distal esophagus exposure during recumbent posture and overall borderline normal. 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 Normal findings with biopsy results pending. Demeester 16. Patient denies any reflux symptoms with every day dosing.     Patient reported using Advair HFA (MDI) like a previous diskus (DPI). Educated by provider on appropriate technique.     Plan: Start montelukast 10 mg daily for CLAD prophylaxis. Patient also reports minor symptoms of allergies that may be relieved with this medication. Reassess inhaler technique at future visit.     CV Prophylaxis:   Aspirin: asa 81 mg daily  The 10-year ASCVD risk score (Arnett DK, et al., 2019) is: 31.1%  Statin: pravastatin; currently on 40 mg daily - previously held for elevated LFTs      Plan: Continue current regimen    BP/Edema: Goal <140/90.   Encounter vitals reported above.  Home BP ranges: 120s/70s mmHg  Weights: stable  Current meds include: terazosin 3mg  at bedtime for BPH    Plan: BP and BPH within goal. Continue.     Anemia:  H/H:   Lab Results   Component Value Date    HGB 12.7 (L) 04/06/2021     Lab Results   Component Value Date    HCT 37.4 (L) 04/06/2021     Iron panel:  Lab Results   Component Value Date    IRON 52 (L) 08/01/2020    TIBC 304 08/01/2020    FERRITIN 607.8 (H) 08/01/2020     Lab Results   Component Value Date    Iron Saturation (%) 17 08/01/2020     Prior anemia therapy: IV Ferrlecit (1/10-1/13)    Plan: H/H within goal       DM:   Lab Results   Component Value Date    A1C 5.1 12/03/2020   Goal A1c < 7%.  Current meds include:  ?? Insulin Glargine 12 units daily in AM  ?? Novolog SSI 2:50>150  ?? Novolog units 7/7/3  ?? Metformin 1000 mg PO BID  ?? HOLDING home glipizide (took pre-transplant)    Home BS log: Fastings within goal consistently; pre-prandial at lunch and dinner WNL save excursions for heavy carb meals and ice cream/sweets jar  Exercise: walking daily up to 2 miles a day  Hypoglycemia: no  Plan: continue current reigmen    Electrolytes: wnl  Current meds include: MgOx 1200 mg BID    Plan: continue current therapy.     Bone health:  Patient currently on long-term steroid therapy.  Vitamin D Level: 51.7 (3/22). Goal > 30.   Last DEXA results (02/2020 pre-transplant): normal bone density  Current meds include: vitamin D3 2000 units daily, calcium carbonate 600 mg BID    Plan: Continue current therapy. Follow up vitamin D level that was collected in clinic today.    Women's/Men's Health:  Spencer Pena. is a 71 y.o. male. Patient reports history of BPH. Reports that BPH symptoms are controlled.  Current meds include: terazosin 3 mg PO nightly (increased 09/05/20 given increased BPH symptoms)  Plan: Continue current dose and BP monitoring.     Adherence:   Patient has good understanding of medications.  Patient does fill their own pill box on a regular basis at home. Wife had previously been filing for him, but he now handles it on his own for the most part.   Patient brought medication card: yes  Pill box: was correct  Patient requested refills for the following meds: None  Corrections needed in Epic medication list: none    Plan: Provided basic adherence counseling/intervention    I spent a total of 20 minutes face to face with the patient delivering clinical care and providing education/counseling.    Patient was reviewed with Dr. Dudley Major who was agreement with the stated plan.    During this visit, the following was completed:   BG log data assessment  BP log data assessment  Labs ordered and evaluated  complex treatment plan >1 DS   Patient education was completed for 25-60 minutes     All questions/concerns were addressed to the patient's satisfaction.  __________________________________________  PATIENT SEEN AND EVALUATED BY:    Elesa Massed, Spencer Pena, Spencer Pena  PGY-2 Pharmacotherapy Resident  Phone: [phone]    Spencer Pena, Spencer Pena, Spencer Pena, Spencer Pena  [employer] PHARMACIST PRACTITIONER  PAGER [phone]

## 2021-05-15 NOTE — Unmapped (Signed)
Pulmonary Transplant Clinic    HISTORY:   HPI:  71 y.o. 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, also with type 2 diabetes mellitus, hypertension, colon cancer in 2007 s/p partial colon and hepatic wedge resection and FOLFOX here for transplant complications    - Coronavirus HKU1 positive 05/06/2022; continues to have congestion with post nasal drip but improving daily; no fever or chills, night sweats  -- Holding off on circumcsion  -- walks one mile a day  -- BP at goal  -- mag at goal with current mg regimen  -- IgG M<700  -- has 5 boxes tamiflu at home; has not started ppx  -- educated on spacer use with inhaler  and slow and low inhalation  -- requesting to work part time again    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     --> basiliximab induction    --> CMV high risk (D+/R-)    --> EBV moderate risk (D+/R+)    --> Not Increased Risk Donor  - Colon Cancer 2007    --> s/p resection of liver mets    --> s/p resection of 6 inches of colon    --> FOLFOX Chemo  - type 2 diabetes mellitus     --> not insulin dependent  - OA    SOCIAL HISTORY:   - 20pk years, quit in 1991  - drinks about 10 drinks a week but will stop  - Works maintenance at [employer]  - Married and lives in [place]    FAMILY HISTORY:   - Parents lived into late 90's    MEDS: (personally reviewed in EPIC, pertinent meds noted below)   Tac 1mg  am and 0.5mg  pm  Pred 10mg  daily;decrease to 7.5mg  daily at one year mark  Azathioprine 50mg  daily    CLAD PPx  -- advair bid; educated on spacer use  -- azithromycin250 daily  -- start Montelukast 10mg  daily   Current Outpatient Medications on File Prior to Visit   Medication Sig Dispense Refill   ??? acetaminophen (TYLENOL EXTRA STRENGTH) 500 MG tablet Take 1-2 tablets (500-1,000 mg total) by mouth every six (6) hours as needed for pain. 180 tablet PRN   ??? aspirin (ECOTRIN) 81 MG tablet Take 1 tablet (81 mg total) by mouth daily. 90 tablet 3   ??? azaTHIOprine (IMURAN) 50 mg tablet Take 1 tablet (50 mg total) by mouth daily. 90 tablet 3   ??? azithromycin (ZITHROMAX) 250 MG tablet Take 1 tablet (250 mg total) by mouth daily. 30 tablet 11   ??? blood sugar diagnostic (ON CALL EXPRESS TEST STRIP) Strp Test daily before all meals/snacks and once before bedtime. 100 each 11   ??? calcium carbonate (CALCIUM 600) 1,500 mg (600 mg elem calcium) tablet Take 1 tablet (600 mg of elem calcium total) by mouth Two (2) times a day. 60 tablet 11   ??? cholecalciferol, vitamin D3 25 mcg, 1,000 units,, 1,000 unit (25 mcg) tablet Take 2 tablets (50 mcg total) by mouth daily. 100 tablet 11   ??? fluticasone propion-salmeteroL (ADVAIR HFA) 115-21 mcg/actuation inhaler Inhale 2 puffs Two (2) times a day. 12 g 11   ??? insulin ASPART (NOVOLOG FLEXPEN U-100 INSULIN) 100 unit/mL (3 mL) injection pen Inject 7 units with breakfast (10 units with high sugar breakfast), 7 units with afternoon meal, and 3 units with evening snack plus sliding scale 2 units for every 50 >150; max 50 units/day  15 mL 2   ??? insulin glargine (BASAGLAR, LANTUS) 100 unit/mL (3 mL) injection pen Inject 0.12 mL (12 Units total) under the skin nightly. 15 mL 3   ??? lancets 30 gauge Misc Test daily before all meals/snacks and once before bedtime. 100 each 11   ??? magnesium oxide (MAG-OX) 400 mg (241.3 mg elemental magnesium) tablet Take 3 tablets (1,200 mg total) by mouth Two (2) times a day. 180 tablet 11   ??? metFORMIN (GLUCOPHAGE) 1000 MG tablet Take 1 tablet (1,000 mg total) by mouth Two (2) times a day. 180 tablet 3   ??? oseltamivir (TAMIFLU) 30 MG capsule Take 1 capsule (30 mg total) by mouth daily. 30 capsule 3   ??? pantoprazole (PROTONIX) 40 MG tablet Take 1 tablet (40 mg total) by mouth daily. 90 tablet 3   ??? pen needle, diabetic (BD ULTRA-FINE NANO PEN NEEDLE) 32 gauge x 5/32 (4 mm) Ndle Use as directed for injections five (5) times a day. 150 each 11   ??? pravastatin (PRAVACHOL) 40 MG tablet Take 1 tablet (40 mg total) by mouth daily. 30 tablet 11   ??? predniSONE (DELTASONE) 5 MG tablet Take 2 tablets (10 mg total) by mouth in the morning. 60 tablet 11   ??? sulfamethoxazole-trimethoprim (BACTRIM) 400-80 mg per tablet Take 1 tablet (80 mg of trimethoprim total) by mouth Every Monday, Wednesday, and Friday. 36 tablet 3   ??? tacrolimus (PROGRAF) 0.5 MG capsule Take 1 capsule (0.5 mg total) by mouth nightly. Take 1 mg capsule in morning (1 mg AM / 0.5 mg PM) 90 capsule 3   ??? tacrolimus (PROGRAF) 1 MG capsule Take 1 capsule (1 mg total) by mouth in the morning. Take 0.5 mg capsule in evening. (1 mg AM / 0.5 mg PM). 90 capsule 3   ??? terazosin (HYTRIN) 1 MG capsule Take 3 capsules (3 mg total) by mouth nightly. 90 capsule 11   ??? valGANciclovir (VALCYTE) 450 mg tablet Take 1 tablet (450 mg total) by mouth daily. 30 tablet 11     No current facility-administered medications on file prior to visit.           OBJECTIVE DATA:   PHYSICAL EXAM:  BP 141/67 (BP Site: L Arm, BP Position: Sitting, BP Cuff Size: Large)  - Pulse 89  - Temp 35.8 ??C (96.4 ??F) (Tympanic)  - Ht 170.2 cm (5' 7)  - Wt 74.8 kg (165 lb)  - BMI 25.84 kg/m??      Gen: - awake, alert, in NAD  Derm: - well-healing surgical scars s/p BOLT, right sided chest tube sites healing very well, left sided chest tube sites healing well but remain scarred over  Vascular: - good pulses throughout,   CV: - RRR, no m/r/g  Pulm: - Normal symmetric excursion, Normal WOB on RA. Good air movement bilaterally, without wheezes or crackles including with forced expiraiton. Normal upper airway sounds without evidence of stridor  Abd: - soft, NT, ND, +BS, no hepatosplenomegally  Ext: - no edema of LE, no clubbing  Skin: mild redness over anterior chest, no warmth or TTP   Neuro: - Alert, oriented, follows commands. Moving all extremities spontaneously    LABS: (reviewed in Epic, pertinent values noted below)     PFTs:  (personally reveiewed and interpreted):      Date: FVC (% Pred) FEV1 (% Pred) FEF25-75(% Pred) DLCO TBBx/Results  05/15/2021 4.21(114%) 3.65(130.3% 5.54 (254%)     04/06/2021 4.08 (111%) 3.64 (129.6%) 6.01 (274.3%)     12/31/20 3.95 (107.1%) 3.46 (122.6%) 5.20 (236.4%)     12/03/2020 3.87 (104.9%) 3.47 (122.9%) 5.69 (258.3%)     10/03/2020 3.68 (99.4) 3.13 (110.6)  4.22 (190.6)      09/05/20  3.70  3.14      08/20/20 3.65 (98%) 3.15 (111%) 4.69 (211%)      08/01/20 3.64 (98.2%) 3.10 (109.3%) 4.41 (198.8%)     07/18/20 3.49 (94%) 2.98 (105%) 4.20 (189%)     07/04/20 3.43 (92.5%) 2.97 (104.7%) 4.26 (191.7%)     06/27/2020 3.13 84.5 2.70 95.4 3.51 157.8     06/20/20 3.39 (91.4%) 2.82 (99.2%) 3.34 (150.35)     06/13/20 3.10 (72.8%) 2.66 (82.5%) 3.34 (136.2%)      05/15/20      BOLT    03/11/20  2.75 (72%)  2.36 (81%)       02/12/20  2.85 (75%)  2.44 (86%)                IMAGING: (Personally reviewed, our interpretation is below)    CXR 04/06/21  No acute findings.     CXR 08/20/20: stable blunting of R costophrenic angle with elevated R hemidiaphragm. NO acute changes     CXR 07/18/2020:  Small right pleural effusion vs mild elevation of the right hemidiaphragm, stable since early 06/2020    ASSESSMENT and PLAN   71 y.o. 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     --> Stopped MMF 06/13/20 (GI upset).    - Prednisone 10mg  daily (decreased last visit); Decrease to 7.5mg  daily today  - Azithro BOS ppx, aspirin 81 mg daily  - Restarted prava 40mg  QD -Continue Advair bid but utilize with spacer  - start Montelukast 10mg  daily    Antimicrobial Prophylaxis:  - CMV, high risk (D+/R-): valganciclovir 450mg  daily for Estimated Creatinine Clearance: 43.7 mL/min (A) (based on SCr of 1.47 mg/dL (H)). Negative 06/20/20  - EBV, (D+/R+): negative 06/20/20  - Fungal: Posaconazole stopped at 97mo  - PCP: Bactrim SS MWF   - tamiflu restarted today    Pain control:  - Moderately well controlled  - Previously on gabapentin 100mg  TID, now off  - Tylenol TID prn    Concern for OSA: STOP-BANG 4 points (male, age, observed apnea, snoring)  - Continue CPAP    Loose stools- Improved with switch from MMF to imuran  - Encouraged him to avoid sugar free foods as this exacerbates symptoms.  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  - pH probe with DeMeester 16.7%, slightly  in abnormal range but will hold off on surgical intervention at this time in the setting of normal graft function.    --> On daily PPI    BPH  - Increased terazosin to 2mg  nightly to help symptoms and BP, and symptoms have resolved... increase to 4mg  as posa washing out     Blood Pressure Management:  - Home BPs stable  - Not currently on therapy    Kidney Function:  - Cr baseline 0.8-1.13  - Creatinine 1.3-1.5 most recently  - Lasix on hold, no increase in LE edema    Iron Deficiency Anemia:   - received IV iron 05/22/20, continue to be low    Hypomagnesemia: Continue to be low  - Increase 800 mg BID to 1200 mg BID today  ??  Antiarrythmic Prophylaxis:   - finished amiodarone without arrhythmia.     Type 2 diabetes mellitus   - Lantus will drop to 12 units in the nightly with pred adjustment  - aspart 7/7/3 with pred adjustment  - metformin 1000mg  BID  - sugars well controlled, apart from some lower morning sugars (16-109)    Bone Health: Last Dexa: Oct 2021, no osteopenia or osteoporosis  - on Ca and Vit D supplementation currently, last Vit D 27 on 05/24/20    Health Maintenance:  - Last derm visit:  Pre-txp,    - Last Colonsocopy (if >40): 12/2016 Diverticulosis and internal hemorrhoids; due in 2023  - Last flu shot:   11//28/2022,   - Last Pneumovax:  06/10/2004, 12/30/2017  - Last Prevnar:  03/25/2016  - TdaP:   02/11/2018  - Shingrix:    03/10/20, 08/20/20  - COVID-19 (Moderna) 06/11/19, 07/09/19, 03/10/20, 07/18/2020, 12/03/20 received 1/2 dose Evushield,     03/2021 (Bivalent)   - Last Dexa:   02/2020, will need DEXA at 77yr; Due for repeat  - Last vitamin D:  51.2 (07/18/2020)  - Last HbA1c:   6.0 (06/13/2020), 5.1, can repeat at next visit    Immunization History   Administered Date(s) Administered   ??? COVID-19 VACCINE,MRNA(MODERNA)(PF) 06/11/2019, 07/09/2019, 03/10/2020, 07/18/2020, 12/03/2020   ??? HEPB-CPG,ADULT DOSAGE ADJUVANTED, IM 03/12/2020   ??? INFLUENZA INJ MDCK PF, QUAD,(FLUCELVAX)(4MO AND UP EGG FREE) 02/08/2020   ??? INFLUENZA TIV (TRI) PF (IM) 02/27/2010   ??? Influenza Vaccine Quad (IIV4 PF) 57mo+ injectable 01/19/2019, 04/06/2021   ??? Influenza Virus Vaccine, unspecified formulation 02/07/2013, 02/28/2015, 02/05/2016, 02/02/2017, 01/27/2018   ??? PNEUMOCOCCAL POLYSACCHARIDE 23 06/10/2004, 12/30/2017   ??? Pneumococcal Conjugate 13-Valent 03/25/2016   ??? SHINGRIX-ZOSTER VACCINE (HZV), RECOMBINANT,SUB-UNIT,ADJUVANTED IM 03/10/2020, 08/20/2020   ??? TdaP 02/15/2012, 02/11/2018       >50min was spent with the patient face to face and >40min was spent reviewing chart/imaging.     Complex medical decision making was done as we adjust medications based on blood work/drug levels and multiple complaints and problems were addressed    The patient was assessed and discussed with Dr. Glenard Haring who is in agreement with plan.    Latrina Guttman A. Alejandro Mulling, MD  PGY4, Pulmonary and Critical Care Medicine  [phone]  05/15/2021

## 2021-05-16 LAB — EBV QUANTITATIVE PCR, BLOOD: EBV VIRAL LOAD RESULT: NOT DETECTED

## 2021-05-16 LAB — CMV DNA, QUANTITATIVE, PCR: CMV VIRAL LD: NOT DETECTED

## 2021-05-16 NOTE — Unmapped (Signed)
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 Wife Spencer Pena was with him.    Spencer Pena reports that things have been going well over the past several months. He remains active, doing things around the house and yard. He walks about a mile every day and does pushups. He stood outside for about 5 hours before Christmas smoking some meat, despite temperatures being in the teens. He says after years of working outside in all kinds of weather, he knows how to dress for the cold.    Spencer Pena says he finally retired this past summer. He used all of his sick leave first, as that was financially advantageous.    Spencer Pena says Spencer Pena needs knee surgery soon, but postponed it due to a cold and Spencer Pena's follow-up visit. They both continue to look after her mother, who is 15 and has a lot of pain and mobility issues. The couple hopes that when Spencer Pena's knee is rehabilitated they can travel. They're pretty isolated due to infection concerns, but are eager to get out and go.    MENTAL HEALTH SCREENING:    PHQ-9 Score:  PHQ-9 TOTAL SCORE: 0    GAD-7 Score:  GAD-7 Total Score: 0    SOCIAL DETERMINANTS OF HEALTH:    Social Determinants of Health     Food Insecurity: No Food Insecurity   ??? Worried About Programme researcher, broadcasting/film/video in the Last Year: Never true   ??? Ran Out of Food in the Last Year: Never true   Tobacco Use: Medium Risk   ??? Smoking Tobacco Use: Former   ??? Smokeless Tobacco Use: Former   ??? Passive Exposure: Not on file   Transportation Needs: No Transportation Needs   ??? Lack of Transportation (Medical): No   ??? Lack of Transportation (Non-Medical): No   Alcohol Use: Not At Risk   ??? How often do you have a drink containing alcohol?: Monthly or less   ??? How many drinks containing alcohol do you have on a typical day when you are drinking?: 1 - 2   ??? How often do you have 5 or more drinks on one occasion?: Never   Housing/Utilities: Low Risk    ??? Within the past 12 months, have you ever stayed: outside, in a car, in a tent, in an overnight shelter, or temporarily in someone else's home (i.e. couch-surfing)?: No   ??? Are you worried about losing your housing?: No   ??? Within the past 12 months, have you been unable to get utilities (heat, electricity) when it was really needed?: No   Substance Use: Low Risk    ??? Taken prescription drugs for non-medical reasons: Never   ??? Taken illegal drugs: Never   ??? Patient indicated they have taken drugs in the past year for non-medical reasons: Yes, [positive answer(s)]: Not on file   Financial Resource Strain: Low Risk    ??? Difficulty of Paying Living Expenses: Not hard at all   Physical Activity: Sufficiently Active   ??? Days of Exercise per Week: 7 days   ??? Minutes of Exercise per Session: 60 min   Health Literacy: Low Risk    ??? : Never   Stress: No Stress Concern Present   ??? Feeling of Stress : Not at all   Intimate Partner Violence: Not At Risk   ??? Fear of Current or Ex-Partner: No   ??? Emotionally Abused:  No   ??? Physically Abused: No   ??? Sexually Abused: No   Depression: Not at risk   ??? PHQ-2 Score: 0   Social Connections: Socially Isolated   ??? Frequency of Communication with Friends and Family: Never   ??? Frequency of Social Gatherings with Friends and Family: Never   ??? Attends Religious Services: Never   ??? Active Member of Clubs or Organizations: No   ??? Attends Banker Meetings: Never   ??? 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 He does not endorse symptoms consistent with clinical depression or anxiety. There are no social determinants of health issues that require attention, as isolation is purposeful due to infection control risk.    PLAN:  I remain available for support and resource information as needed.     Met with patient in an exam room in the [facility].  He was wearing a mask for the duration of our interaction. His wife also wore a mask during the entire visit. I wore a surgical mask and was within 6 feet of the patient/visitor during this interaction.     [name], LCSW, CCTSW  Transplant Case Manager  May 15, 2021 5:20 PM

## 2021-05-18 LAB — VITAMIN D 25 HYDROXY: VITAMIN D, TOTAL (25OH): 66.9 ng/mL (ref 20.0–80.0)

## 2021-05-19 LAB — CMV T-CELL IMMUNITY PANEL

## 2021-05-20 MED ORDER — MONTELUKAST 10 MG TABLET
ORAL_TABLET | Freq: Every day | ORAL | 2 refills | 30 days | Status: CP
Start: 2021-05-20 — End: 2022-05-20
  Filled 2021-05-21: qty 30, 30d supply, fill #0

## 2021-05-20 NOTE — Unmapped (Signed)
Please add protein to your evening meal (eggs, cheese (cheddar, swiss or monterey jack), low sodium deli meat, protein shake)  At this time you can:  Eat all well-washed fruits and vegetables with the exception of grapefruit and pomegranate  Eat cold lunch meats, deli meats and cold cuts  Eat raw fruits and vegetables outside the home  Use ice makers and beverage dispensers outside the home  Eat medium rare or rare steaks   Eat chicken salad, tuna salad, egg salad made in a restaurant or purchased from a store  Have eggs with runny yolks (over easy, over medium, etc.)  Have blue cheeses like gorgonzola, bleu cheese, stilton and Roquefort     Please remember that you should still avoid:  Buffets or any food that is obtained via the public serving themselves  Raw fish  Burgers not cooked well-done  Any shared condiments on tables in restaurants (just continue to ask for packets)    Please continue to:  Use a meat thermometer when cooking to ensure proper cooking temperature  Use separated cutting boards (one for meat, fish, pork, chicken, [place], etc; and one for fruits and vegetables)  Only defrost foods in the refrigerator or microwave  Not rinse raw meat or chicken to avoid contaminating the faucet and surrounding surfaces  Avoid consuming any well water unless it is boiled

## 2021-05-21 MED FILL — AZATHIOPRINE 50 MG TABLET: ORAL | 90 days supply | Qty: 90 | Fill #2

## 2021-05-21 MED FILL — BD ULTRA-FINE NANO PEN NEEDLE 32 GAUGE X 5/32" (4 MM): SUBCUTANEOUS | 40 days supply | Qty: 200 | Fill #6

## 2021-05-21 NOTE — Unmapped (Signed)
Abstraction Result Flowsheet Data    This patient's last AWV date: [facility] Last Medicare Wellness Visit Date: Not Found  This patients last WCC/CPE date: : 07/23/2019      Reason for Encounter  Reason for Encounter: Outreach  Primary Reason for Outreach: AWV  Outreach Call Outcome: Patient would like call back later  Text Message: No                             Micro Albumin Gap   Micro Albumin Gap Reviewed: Yes  Gap Closed? : No  If No, Result Details: No - No results found

## 2021-05-26 LAB — HLA DS POST TRANSPLANT
ANTI-DONOR DRW #1 MFI: 0 MFI
ANTI-DONOR DRW #2 MFI: 0 MFI
ANTI-DONOR HLA-A #1 MFI: 0 MFI
ANTI-DONOR HLA-A #2 MFI: 0 MFI
ANTI-DONOR HLA-B #1 MFI: 0 MFI
ANTI-DONOR HLA-B #2 MFI: 0 MFI
ANTI-DONOR HLA-C #1 MFI: 0 MFI
ANTI-DONOR HLA-C #2 MFI: 0 MFI
ANTI-DONOR HLA-DP AG #1 MFI: 0 MFI
ANTI-DONOR HLA-DQB #1 MFI: 0 MFI
ANTI-DONOR HLA-DQB #2 MFI: 0 MFI
ANTI-DONOR HLA-DR #1 MFI: 0 MFI
ANTI-DONOR HLA-DR #2 MFI: 0 MFI

## 2021-05-26 LAB — FSAB CLASS 2 ANTIBODY SPECIFICITY: HLA CL2 AB RESULT: NEGATIVE

## 2021-05-26 LAB — FSAB CLASS 1 ANTIBODY SPECIFICITY: HLA CLASS 1 ANTIBODY RESULT: NEGATIVE

## 2021-05-28 MED ORDER — PREDNISONE 5 MG TABLET
ORAL_TABLET | Freq: Every day | ORAL | 3 refills | 90.00000 days | Status: CP
Start: 2021-05-28 — End: 2022-05-28
  Filled 2021-06-01: qty 135, 90d supply, fill #0

## 2021-05-28 NOTE — Unmapped (Signed)
Pt request for RX Refill predniSONE (DELTASONE) 5 MG tablet

## 2021-05-28 NOTE — Unmapped (Signed)
[facility] Refill Coordination Note    Specialty Medication(s) to be Shipped:   Transplant: valgancyclovir 450mg     Other medication(s) to be shipped: terazosin, prednisone     Spencer WANDREY Sr., DOB: December 07, 1950  Phone: [phone] (home) [phone] (work)      All above HIPAA information was verified with patient.     Was a Nurse, learning disability used for this call? No    Completed refill call assessment today to schedule patient's medication shipment from the [facility] [phone]).  All relevant notes have been reviewed.     Specialty medication(s) and dose(s) confirmed: Regimen is correct and unchanged.   Changes to medications: Ernan reports no changes at this time.  Changes to insurance: No  New side effects reported not previously addressed with a pharmacist or physician: None reported  Questions for the pharmacist: No    Confirmed patient received a Conservation officer, historic buildings and a Surveyor, mining with first shipment. The patient will receive a drug information handout for each medication shipped and additional FDA Medication Guides as required.       DISEASE/MEDICATION-SPECIFIC INFORMATION        N/A    SPECIALTY MEDICATION ADHERENCE     Medication Adherence    Patient reported X missed doses in the last month: 0  Specialty Medication: Valganciclovir  Patient is on additional specialty medications: No  Patient is on more than two specialty medications: No  Any gaps in refill history greater than 2 weeks in the last 3 months: no  Demonstrates understanding of importance of adherence: yes  Informant: patient              Were doses missed due to medication being on hold? No    Valganciclovir 450mg : Patient has 7 days of medication on hand    REFERRAL TO PHARMACIST     Referral to the pharmacist: Not needed      [facility]     Shipping address confirmed in Epic.     Delivery Scheduled: Yes, Expected medication delivery date: 1/24.     Medication will be delivered via Next Day Courier to the prescription address in Epic WAM.    Olga Millers   [facility] Specialty Technician

## 2021-05-28 NOTE — Unmapped (Signed)
error 

## 2021-06-01 MED FILL — VALGANCICLOVIR 450 MG TABLET: ORAL | 30 days supply | Qty: 30 | Fill #11

## 2021-06-01 MED FILL — TERAZOSIN 1 MG CAPSULE: ORAL | 30 days supply | Qty: 90 | Fill #9

## 2021-06-09 ENCOUNTER — Ambulatory Visit: Admit: 2021-06-09 | Discharge: 2021-06-10 | Payer: MEDICARE

## 2021-06-09 DIAGNOSIS — Z942 Lung transplant status: Principal | ICD-10-CM

## 2021-06-09 LAB — TACROLIMUS LEVEL, TROUGH: TACROLIMUS, TROUGH: 7.8 ng/mL (ref 5.0–15.0)

## 2021-06-09 LAB — COMPREHENSIVE METABOLIC PANEL
ALBUMIN: 3.9 g/dL (ref 3.4–5.0)
ALKALINE PHOSPHATASE: 94 U/L (ref 46–116)
ALT (SGPT): 29 U/L (ref 10–49)
ANION GAP: 7 mmol/L (ref 5–14)
AST (SGOT): 32 U/L (ref <=34)
BILIRUBIN TOTAL: 0.3 mg/dL (ref 0.3–1.2)
BLOOD UREA NITROGEN: 38 mg/dL — ABNORMAL HIGH (ref 9–23)
BUN / CREAT RATIO: 27
CALCIUM: 9.7 mg/dL (ref 8.7–10.4)
CHLORIDE: 105 mmol/L (ref 98–107)
CO2: 31.1 mmol/L — ABNORMAL HIGH (ref 20.0–31.0)
CREATININE: 1.43 mg/dL — ABNORMAL HIGH
EGFR CKD-EPI (2021) MALE: 53 mL/min/{1.73_m2} — ABNORMAL LOW (ref >=60)
GLUCOSE RANDOM: 107 mg/dL — ABNORMAL HIGH (ref 70–99)
POTASSIUM: 4.5 mmol/L (ref 3.4–4.8)
PROTEIN TOTAL: 6 g/dL (ref 5.7–8.2)
SODIUM: 143 mmol/L (ref 135–145)

## 2021-06-09 LAB — CBC W/ AUTO DIFF
BASOPHILS ABSOLUTE COUNT: 0 10*9/L (ref 0.0–0.1)
BASOPHILS RELATIVE PERCENT: 0.7 %
EOSINOPHILS ABSOLUTE COUNT: 0 10*9/L (ref 0.0–0.5)
EOSINOPHILS RELATIVE PERCENT: 1.1 %
HEMATOCRIT: 35 % — ABNORMAL LOW (ref 39.0–48.0)
HEMOGLOBIN: 11.8 g/dL — ABNORMAL LOW (ref 12.9–16.5)
LYMPHOCYTES ABSOLUTE COUNT: 0.6 10*9/L — ABNORMAL LOW (ref 1.1–3.6)
LYMPHOCYTES RELATIVE PERCENT: 14.5 %
MEAN CORPUSCULAR HEMOGLOBIN CONC: 33.6 g/dL (ref 32.0–36.0)
MEAN CORPUSCULAR HEMOGLOBIN: 31.4 pg (ref 25.9–32.4)
MEAN CORPUSCULAR VOLUME: 93.6 fL (ref 77.6–95.7)
MEAN PLATELET VOLUME: 7.3 fL (ref 6.8–10.7)
MONOCYTES ABSOLUTE COUNT: 0.3 10*9/L (ref 0.3–0.8)
MONOCYTES RELATIVE PERCENT: 6.8 %
NEUTROPHILS ABSOLUTE COUNT: 3.3 10*9/L (ref 1.8–7.8)
NEUTROPHILS RELATIVE PERCENT: 76.9 %
NUCLEATED RED BLOOD CELLS: 0 /100{WBCs} (ref <=4)
PLATELET COUNT: 151 10*9/L (ref 150–450)
RED BLOOD CELL COUNT: 3.74 10*12/L — ABNORMAL LOW (ref 4.26–5.60)
RED CELL DISTRIBUTION WIDTH: 14.9 % (ref 12.2–15.2)
WBC ADJUSTED: 4.2 10*9/L (ref 3.6–11.2)

## 2021-06-09 LAB — IGG: GAMMAGLOBULIN; IGG: 583 mg/dL — ABNORMAL LOW (ref 646–2013)

## 2021-06-09 LAB — MAGNESIUM: MAGNESIUM: 1.6 mg/dL (ref 1.6–2.6)

## 2021-06-09 LAB — PHOSPHORUS: PHOSPHORUS: 3.5 mg/dL (ref 2.4–5.1)

## 2021-06-09 NOTE — Unmapped (Signed)
Called Spencer Pena Sr. to discuss lab results. Tacrolimus level was normal at 7.8 with a goal of 7-9. Medication regimen is currently 1/0.5. Dosing at this time to remain the same. Repeat labs scheduled monthly. Resulted labs reviewed. All questions answered. Pt verbalized understanding.

## 2021-06-10 MED ORDER — ON CALL EXPRESS TEST STRIP
11 refills | 0 days | Status: CP
Start: 2021-06-10 — End: ?
  Filled 2021-06-15: qty 100, 20d supply, fill #0

## 2021-06-15 MED FILL — PRAVASTATIN 40 MG TABLET: ORAL | 30 days supply | Qty: 30 | Fill #8

## 2021-06-15 MED FILL — CALCIUM CARBONATE 600 MG CALCIUM (1,500 MG) TABLET: ORAL | 30 days supply | Qty: 60 | Fill #1

## 2021-06-15 MED FILL — LANTUS SOLOSTAR U-100 INSULIN 100 UNIT/ML (3 ML) SUBCUTANEOUS PEN: SUBCUTANEOUS | 90 days supply | Qty: 15 | Fill #2

## 2021-06-15 MED FILL — MONTELUKAST 10 MG TABLET: ORAL | 30 days supply | Qty: 30 | Fill #1

## 2021-06-15 MED FILL — PANTOPRAZOLE 40 MG TABLET,DELAYED RELEASE: ORAL | 90 days supply | Qty: 90 | Fill #1

## 2021-06-15 MED FILL — MAGNESIUM OXIDE 400 MG (241.3 MG MAGNESIUM) TABLET: ORAL | 30 days supply | Qty: 180 | Fill #8

## 2021-06-15 MED FILL — AZITHROMYCIN 250 MG TABLET: ORAL | 30 days supply | Qty: 30 | Fill #1

## 2021-06-18 NOTE — Unmapped (Signed)
Outgoing call to [employer] Sr. to let him know he can stop taking tamiflu daily, but to keep on hand.  Questions answered, verbalized understanding.

## 2021-06-19 MED ORDER — FLUTICASONE PROPIONATE 115 MCG-SALMETEROL 21 MCG/ACTUATION HFA INHALER
Freq: Two times a day (BID) | RESPIRATORY_TRACT | 11 refills | 30.00000 days | Status: CP
Start: 2021-06-19 — End: 2022-06-19

## 2021-06-19 NOTE — Unmapped (Signed)
Abstraction Result Flowsheet Data    This patient's last AWV date: [facility] Last Medicare Wellness Visit Date: Not Found  This patients last WCC/CPE date: : 07/23/2019      Reason for Encounter  Reason for Encounter: Outreach  Primary Reason for Outreach: AWV  Text Message: No  Outreach Call Outcome: Patient would like call back later                             Micro Albumin Gap   Micro Albumin Gap Reviewed: Yes  Gap Closed? : No  If No, Result Details: No - No results found  A1C Gap   A1C Gap Reviewed: Yes  Gap Closed? : No  If No, Result Details: No - No results found

## 2021-06-23 DIAGNOSIS — B259 Cytomegaloviral disease, unspecified: Principal | ICD-10-CM

## 2021-06-23 MED ORDER — CHOLECALCIFEROL (VITAMIN D3) 25 MCG (1,000 UNIT) TABLET
ORAL_TABLET | Freq: Every day | ORAL | 11 refills | 50 days | Status: CP
Start: 2021-06-23 — End: 2022-06-23
  Filled 2021-06-29: qty 100, 50d supply, fill #0

## 2021-06-23 MED ORDER — VALGANCICLOVIR 450 MG TABLET
ORAL_TABLET | Freq: Every day | ORAL | 11 refills | 30 days | Status: CP
Start: 2021-06-23 — End: ?
  Filled 2021-06-29: qty 30, 30d supply, fill #0

## 2021-06-23 NOTE — Unmapped (Signed)
[facility] Clinical Assessment & Refill Coordination Note    Spencer BISHOP Sr., DOB: [birth year]  Phone: [phone] (home) [phone] (work)    All above HIPAA information was verified with patient.     Was a Nurse, learning disability used for this call? No    Specialty Medication(s):   Transplant: valgancyclovir 450mg      Current Outpatient Medications   Medication Sig Dispense Refill   ??? acetaminophen (TYLENOL EXTRA STRENGTH) 500 MG tablet Take 1-2 tablets (500-1,000 mg total) by mouth every six (6) hours as needed for pain. 180 tablet PRN   ??? aspirin (ECOTRIN) 81 MG tablet Take 1 tablet (81 mg total) by mouth daily. 90 tablet 3   ??? azaTHIOprine (IMURAN) 50 mg tablet Take 1 tablet (50 mg total) by mouth daily. 90 tablet 3   ??? azithromycin (ZITHROMAX) 250 MG tablet Take 1 tablet (250 mg total) by mouth daily. 30 tablet 11   ??? blood sugar diagnostic (ON CALL EXPRESS TEST STRIP) Strp Test daily before all meals/snacks and once before bedtime. 100 each 11   ??? calcium carbonate (CALCIUM 600) 1,500 mg (600 mg elem calcium) tablet Take 1 tablet (600 mg of elem calcium total) by mouth Two (2) times a day. 60 tablet 11   ??? cholecalciferol, vitamin D3 25 mcg, 1,000 units,, 1,000 unit (25 mcg) tablet Take 2 tablets (50 mcg total) by mouth daily. 100 tablet 11   ??? fluticasone propion-salmeteroL (ADVAIR HFA) 115-21 mcg/actuation inhaler Inhale 2 puffs Two (2) times a day. 12 g 11   ??? insulin ASPART (NOVOLOG FLEXPEN U-100 INSULIN) 100 unit/mL (3 mL) injection pen Inject 7 units with breakfast (10 units with high sugar breakfast), 7 units with afternoon meal, and 3 units with evening snack plus sliding scale 2 units for every 50 >150; max 50 units/day 15 mL 2   ??? insulin glargine (BASAGLAR, LANTUS) 100 unit/mL (3 mL) injection pen Inject 0.12 mL (12 Units total) under the skin nightly. 15 mL 3   ??? lancets 30 gauge Misc Test daily before all meals/snacks and once before bedtime. 100 each 11   ??? magnesium oxide (MAG-OX) 400 mg (241.3 mg elemental magnesium) tablet Take 3 tablets (1,200 mg total) by mouth Two (2) times a day. 180 tablet 11   ??? metFORMIN (GLUCOPHAGE) 1000 MG tablet Take 1 tablet (1,000 mg total) by mouth Two (2) times a day. 180 tablet 3   ??? montelukast (SINGULAIR) 10 mg tablet Take 1 tablet (10 mg total) by mouth daily. 30 tablet 2   ??? oseltamivir (TAMIFLU) 30 MG capsule Take 1 capsule (30 mg total) by mouth daily. 30 capsule 3   ??? pantoprazole (PROTONIX) 40 MG tablet Take 1 tablet (40 mg total) by mouth daily. 90 tablet 3   ??? pen needle, diabetic (BD ULTRA-FINE NANO PEN NEEDLE) 32 gauge x 5/32 (4 mm) Ndle Use as directed for injections five (5) times a day. 150 each 11   ??? pravastatin (PRAVACHOL) 40 MG tablet Take 1 tablet (40 mg total) by mouth daily. 30 tablet 11   ??? predniSONE (DELTASONE) 5 MG tablet Take 1.5 tablets (7.5 mg total) by mouth daily. 60 tablet 11   ??? predniSONE (DELTASONE) 5 MG tablet Take 1.5 tablets (7.5 mg total) by mouth daily. 135 tablet 3   ??? sulfamethoxazole-trimethoprim (BACTRIM) 400-80 mg per tablet Take 1 tablet (80 mg of trimethoprim total) by mouth Every Monday, Wednesday, and Friday. 36 tablet 3   ??? tacrolimus (PROGRAF)  0.5 MG capsule Take 1 capsule (0.5 mg total) by mouth nightly. Take 1 mg capsule in morning (1 mg AM / 0.5 mg PM) 90 capsule 3   ??? tacrolimus (PROGRAF) 1 MG capsule Take 1 capsule (1 mg total) by mouth in the morning. Take 0.5 mg capsule in evening. (1 mg AM / 0.5 mg PM). 90 capsule 3   ??? terazosin (HYTRIN) 1 MG capsule Take 3 capsules (3 mg total) by mouth nightly. 90 capsule 11   ??? valGANciclovir (VALCYTE) 450 mg tablet Take 1 tablet (450 mg total) by mouth daily. 30 tablet 11     No current facility-administered medications for this visit.        Changes to medications: Spencer Pena reports no changes at this time.    Allergies   Allergen Reactions   ??? Cetuximab Anaphylaxis     Chest pain/rapid heart rate/ BP dropped   ??? Nsaids (Non-Steroidal Anti-Inflammatory Drug)      Interaction with transplant medications   ??? Venom-Honey Bee Anaphylaxis   ??? Enalapril      Angioedema     ??? Pollen Extracts Other (See Comments)     Nasal stuffiness, coughing, sneezing       Changes to allergies: No    SPECIALTY MEDICATION ADHERENCE     Valganciclovir 450 mg: 7 days of medicine on hand       Medication Adherence    Patient reported X missed doses in the last month: 0  Specialty Medication: Valganciclovir 450mg   Patient is on additional specialty medications: No          Specialty medication(s) dose(s) confirmed: Regimen is correct and unchanged.     Are there any concerns with adherence? No    Adherence counseling provided? Not needed    CLINICAL MANAGEMENT AND INTERVENTION      Clinical Benefit Assessment:    Do you feel the medicine is effective or helping your condition? Yes    Clinical Benefit counseling provided? Not needed    Adverse Effects Assessment:    Are you experiencing any side effects? No    Are you experiencing difficulty administering your medicine? No    Quality of Life Assessment:         How many days over the past month did your lung transplant  keep you from your normal activities? For example, brushing your teeth or getting up in the morning. 0    Have you discussed this with your provider? Not needed    Acute Infection Status:    Acute infections noted within Epic:  No active infections  Patient reported infection: None    Therapy Appropriateness:    Is therapy appropriate and patient progressing towards therapeutic goals? Yes, therapy is appropriate and should be continued    DISEASE/MEDICATION-SPECIFIC INFORMATION      N/A    PATIENT SPECIFIC NEEDS     - Does the patient have any physical, cognitive, or cultural barriers? No    - Is the patient high risk? No    - Does the patient require a Care Management Plan? No     SOCIAL DETERMINANTS OF HEALTH     At the [facility], we have learned that life circumstances - like trouble affording food, housing, utilities, or transportation can affect the health of many of our patients.   That is why we wanted to ask: are you currently experiencing any life circumstances that are negatively impacting your health and/or quality of life? No  Social Determinants of Health     Food Insecurity: No Food Insecurity   ??? Worried About Programme researcher, broadcasting/film/video in the Last Year: Never true   ??? Ran Out of Food in the Last Year: Never true   Tobacco Use: Medium Risk   ??? Smoking Tobacco Use: Former   ??? Smokeless Tobacco Use: Former   ??? Passive Exposure: Not on file   Transportation Needs: No Transportation Needs   ??? Lack of Transportation (Medical): No   ??? Lack of Transportation (Non-Medical): No   Alcohol Use: Not At Risk   ??? How often do you have a drink containing alcohol?: Monthly or less   ??? How many drinks containing alcohol do you have on a typical day when you are drinking?: 1 - 2   ??? How often do you have 5 or more drinks on one occasion?: Never   Housing/Utilities: Low Risk    ??? Within the past 12 months, have you ever stayed: outside, in a car, in a tent, in an overnight shelter, or temporarily in someone else's home (i.e. couch-surfing)?: No   ??? Are you worried about losing your housing?: No   ??? Within the past 12 months, have you been unable to get utilities (heat, electricity) when it was really needed?: No   Substance Use: Low Risk    ??? Taken prescription drugs for non-medical reasons: Never   ??? Taken illegal drugs: Never   ??? Patient indicated they have taken drugs in the past year for non-medical reasons: Yes, [positive answer(s)]: Not on file   Financial Resource Strain: Low Risk    ??? Difficulty of Paying Living Expenses: Not hard at all   Physical Activity: Sufficiently Active   ??? Days of Exercise per Week: 7 days   ??? Minutes of Exercise per Session: 60 min   Health Literacy: Low Risk    ??? : Never   Stress: No Stress Concern Present   ??? Feeling of Stress : Not at all   Intimate Partner Violence: Not At Risk ??? Fear of Current or Ex-Partner: No   ??? Emotionally Abused: No   ??? Physically Abused: No   ??? Sexually Abused: No   Depression: Not at risk   ??? PHQ-2 Score: 0   Social Connections: Socially Isolated   ??? Frequency of Communication with Friends and Family: Never   ??? Frequency of Social Gatherings with Friends and Family: Never   ??? Attends Religious Services: Never   ??? Active Member of Clubs or Organizations: No   ??? Attends Banker Meetings: Never   ??? Marital Status: Married       Would you be willing to receive help with any of the needs that you have identified today? Not applicable       SHIPPING     Specialty Medication(s) to be Shipped:   Transplant: valgancyclovir 450mg     Other medication(s) to be shipped: terazosin, Vit D, Pen Needles     Changes to insurance: No    Delivery Scheduled: Yes, Expected medication delivery date: 06/29/21.     Medication will be delivered via Same Day Courier to the confirmed prescription address in [facility]    The patient will receive a drug information handout for each medication shipped and additional FDA Medication Guides as required.  Verified that patient has previously received a Conservation officer, historic buildings and a Surveyor, mining.    The patient or caregiver noted above participated in the development  of this care plan and knows that they can request review of or adjustments to the care plan at any time.      All of the patient's questions and concerns have been addressed.    Tera Helper   [facility] Specialty Pharmacist

## 2021-06-23 NOTE — Unmapped (Signed)
Pt request for RX Refill valGANciclovir (VALCYTE) 450 mg tablet

## 2021-06-29 MED FILL — BD ULTRA-FINE NANO PEN NEEDLE 32 GAUGE X 5/32" (4 MM): SUBCUTANEOUS | 40 days supply | Qty: 200 | Fill #7

## 2021-06-29 MED FILL — TERAZOSIN 1 MG CAPSULE: ORAL | 30 days supply | Qty: 90 | Fill #10

## 2021-07-06 MED FILL — PRAVASTATIN 40 MG TABLET: ORAL | 30 days supply | Qty: 30 | Fill #9

## 2021-07-06 MED FILL — METFORMIN 1,000 MG TABLET: ORAL | 90 days supply | Qty: 180 | Fill #3

## 2021-07-06 MED FILL — CALCIUM CARBONATE 600 MG CALCIUM (1,500 MG) TABLET: ORAL | 30 days supply | Qty: 60 | Fill #2

## 2021-07-06 MED FILL — AZITHROMYCIN 250 MG TABLET: ORAL | 30 days supply | Qty: 30 | Fill #2

## 2021-07-06 MED FILL — MAGNESIUM OXIDE 400 MG (241.3 MG MAGNESIUM) TABLET: ORAL | 30 days supply | Qty: 180 | Fill #9

## 2021-07-07 ENCOUNTER — Ambulatory Visit: Admit: 2021-07-07 | Discharge: 2021-07-08 | Payer: MEDICARE

## 2021-07-07 LAB — COMPREHENSIVE METABOLIC PANEL
ALBUMIN: 3.7 g/dL (ref 3.4–5.0)
ALKALINE PHOSPHATASE: 91 U/L (ref 46–116)
ALT (SGPT): 23 U/L (ref 10–49)
ANION GAP: 6 mmol/L (ref 5–14)
AST (SGOT): 33 U/L (ref <=34)
BILIRUBIN TOTAL: 0.4 mg/dL (ref 0.3–1.2)
BLOOD UREA NITROGEN: 32 mg/dL — ABNORMAL HIGH (ref 9–23)
BUN / CREAT RATIO: 23
CALCIUM: 9.7 mg/dL (ref 8.7–10.4)
CHLORIDE: 109 mmol/L — ABNORMAL HIGH (ref 98–107)
CO2: 28.8 mmol/L (ref 20.0–31.0)
CREATININE: 1.4 mg/dL — ABNORMAL HIGH
EGFR CKD-EPI (2021) MALE: 54 mL/min/{1.73_m2} — ABNORMAL LOW (ref >=60)
GLUCOSE RANDOM: 98 mg/dL (ref 70–179)
POTASSIUM: 4.4 mmol/L (ref 3.4–4.8)
PROTEIN TOTAL: 6.1 g/dL (ref 5.7–8.2)
SODIUM: 144 mmol/L (ref 135–145)

## 2021-07-07 LAB — CBC W/ AUTO DIFF
BASOPHILS ABSOLUTE COUNT: 0 10*9/L (ref 0.0–0.1)
BASOPHILS RELATIVE PERCENT: 0.6 %
EOSINOPHILS ABSOLUTE COUNT: 0 10*9/L (ref 0.0–0.5)
EOSINOPHILS RELATIVE PERCENT: 1.1 %
HEMATOCRIT: 36.7 % — ABNORMAL LOW (ref 39.0–48.0)
HEMOGLOBIN: 12.2 g/dL — ABNORMAL LOW (ref 12.9–16.5)
LYMPHOCYTES ABSOLUTE COUNT: 0.6 10*9/L — ABNORMAL LOW (ref 1.1–3.6)
LYMPHOCYTES RELATIVE PERCENT: 14.6 %
MEAN CORPUSCULAR HEMOGLOBIN CONC: 33.2 g/dL (ref 32.0–36.0)
MEAN CORPUSCULAR HEMOGLOBIN: 30.5 pg (ref 25.9–32.4)
MEAN CORPUSCULAR VOLUME: 91.8 fL (ref 77.6–95.7)
MEAN PLATELET VOLUME: 7.3 fL (ref 6.8–10.7)
MONOCYTES ABSOLUTE COUNT: 0.3 10*9/L (ref 0.3–0.8)
MONOCYTES RELATIVE PERCENT: 6.8 %
NEUTROPHILS ABSOLUTE COUNT: 3.2 10*9/L (ref 1.8–7.8)
NEUTROPHILS RELATIVE PERCENT: 76.9 %
NUCLEATED RED BLOOD CELLS: 0 /100{WBCs} (ref <=4)
PLATELET COUNT: 151 10*9/L (ref 150–450)
RED BLOOD CELL COUNT: 3.99 10*12/L — ABNORMAL LOW (ref 4.26–5.60)
RED CELL DISTRIBUTION WIDTH: 14.3 % (ref 12.2–15.2)
WBC ADJUSTED: 4.1 10*9/L (ref 3.6–11.2)

## 2021-07-07 LAB — PHOSPHORUS: PHOSPHORUS: 3.5 mg/dL (ref 2.4–5.1)

## 2021-07-07 LAB — TACROLIMUS LEVEL, TROUGH: TACROLIMUS, TROUGH: 10.2 ng/mL (ref 5.0–15.0)

## 2021-07-07 LAB — MAGNESIUM: MAGNESIUM: 1.6 mg/dL (ref 1.6–2.6)

## 2021-07-07 LAB — IGG: GAMMAGLOBULIN; IGG: 558 mg/dL — ABNORMAL LOW (ref 646–2013)

## 2021-07-08 DIAGNOSIS — Z942 Lung transplant status: Principal | ICD-10-CM

## 2021-07-08 LAB — CMV DNA, QUANTITATIVE, PCR: CMV VIRAL LD: NOT DETECTED

## 2021-07-08 NOTE — Unmapped (Signed)
Called Spencer Mallet Sr. to discuss lab results. Tacrolimus level was increased at 10.2 with a goal of 7-9. Medication regimen is currently 1/0.5. Discussed with [name], CPP, dosing at this time changed to 0.5/1. Repeat labs scheduled 2 weeks. Resulted labs reviewed. All questions answered. Pt verbalized understanding.

## 2021-07-09 LAB — EBV QUANTITATIVE PCR, BLOOD: EBV VIRAL LOAD RESULT: NOT DETECTED

## 2021-07-13 NOTE — Unmapped (Signed)
Abstraction Result Flowsheet Data    This patient's last AWV date: [facility] Last Medicare Wellness Visit Date: Not Found  This patients last WCC/CPE date: : 07/23/2019      Reason for Encounter  Reason for Encounter: Outreach  Primary Reason for Outreach: AWV  Text Message: No  Outreach Call Outcome: WTM/Shared Visit cannot be made due to CM/PCP scheduling conflict                                      Previous insurance was [employer] and Med Adv took eff 12/08/2020

## 2021-07-14 MED FILL — MONTELUKAST 10 MG TABLET: ORAL | 30 days supply | Qty: 30 | Fill #2

## 2021-07-21 ENCOUNTER — Ambulatory Visit: Admit: 2021-07-21 | Discharge: 2021-07-22 | Payer: MEDICARE

## 2021-07-21 DIAGNOSIS — Z942 Lung transplant status: Principal | ICD-10-CM

## 2021-07-21 LAB — COMPREHENSIVE METABOLIC PANEL
ALBUMIN: 3.6 g/dL (ref 3.4–5.0)
ALKALINE PHOSPHATASE: 90 U/L (ref 46–116)
ALT (SGPT): 31 U/L (ref 10–49)
ANION GAP: 6 mmol/L (ref 5–14)
AST (SGOT): 36 U/L — ABNORMAL HIGH (ref <=34)
BILIRUBIN TOTAL: 0.3 mg/dL (ref 0.3–1.2)
BLOOD UREA NITROGEN: 35 mg/dL — ABNORMAL HIGH (ref 9–23)
BUN / CREAT RATIO: 24
CALCIUM: 9.2 mg/dL (ref 8.7–10.4)
CHLORIDE: 104 mmol/L (ref 98–107)
CO2: 31 mmol/L (ref 20.0–31.0)
CREATININE: 1.46 mg/dL — ABNORMAL HIGH
EGFR CKD-EPI (2021) MALE: 51 mL/min/{1.73_m2} — ABNORMAL LOW (ref >=60)
GLUCOSE RANDOM: 98 mg/dL (ref 70–179)
POTASSIUM: 4.5 mmol/L (ref 3.4–4.8)
PROTEIN TOTAL: 6 g/dL (ref 5.7–8.2)
SODIUM: 141 mmol/L (ref 135–145)

## 2021-07-21 LAB — CBC W/ AUTO DIFF
BASOPHILS ABSOLUTE COUNT: 0 10*9/L (ref 0.0–0.1)
BASOPHILS RELATIVE PERCENT: 0.7 %
EOSINOPHILS ABSOLUTE COUNT: 0.1 10*9/L (ref 0.0–0.5)
EOSINOPHILS RELATIVE PERCENT: 1.4 %
HEMATOCRIT: 34.8 % — ABNORMAL LOW (ref 39.0–48.0)
HEMOGLOBIN: 11.4 g/dL — ABNORMAL LOW (ref 12.9–16.5)
LYMPHOCYTES ABSOLUTE COUNT: 0.6 10*9/L — ABNORMAL LOW (ref 1.1–3.6)
LYMPHOCYTES RELATIVE PERCENT: 15 %
MEAN CORPUSCULAR HEMOGLOBIN CONC: 32.7 g/dL (ref 32.0–36.0)
MEAN CORPUSCULAR HEMOGLOBIN: 30.2 pg (ref 25.9–32.4)
MEAN CORPUSCULAR VOLUME: 92.2 fL (ref 77.6–95.7)
MEAN PLATELET VOLUME: 7.2 fL (ref 6.8–10.7)
MONOCYTES ABSOLUTE COUNT: 0.3 10*9/L (ref 0.3–0.8)
MONOCYTES RELATIVE PERCENT: 7.5 %
NEUTROPHILS ABSOLUTE COUNT: 3 10*9/L (ref 1.8–7.8)
NEUTROPHILS RELATIVE PERCENT: 75.4 %
NUCLEATED RED BLOOD CELLS: 0 /100{WBCs} (ref <=4)
PLATELET COUNT: 137 10*9/L — ABNORMAL LOW (ref 150–450)
RED BLOOD CELL COUNT: 3.77 10*12/L — ABNORMAL LOW (ref 4.26–5.60)
RED CELL DISTRIBUTION WIDTH: 14.2 % (ref 12.2–15.2)
WBC ADJUSTED: 3.9 10*9/L (ref 3.6–11.2)

## 2021-07-21 LAB — IGG: GAMMAGLOBULIN; IGG: 546 mg/dL — ABNORMAL LOW (ref 646–2013)

## 2021-07-21 LAB — PHOSPHORUS: PHOSPHORUS: 3.8 mg/dL (ref 2.4–5.1)

## 2021-07-21 LAB — MAGNESIUM: MAGNESIUM: 1.6 mg/dL (ref 1.6–2.6)

## 2021-07-21 LAB — TACROLIMUS LEVEL, TROUGH: TACROLIMUS, TROUGH: 5.8 ng/mL (ref 5.0–15.0)

## 2021-07-21 NOTE — Unmapped (Signed)
Called [name] to discuss lab results. Tacrolimus level was decreased at 5.8 with a goal of 7-9. Medication regimen is currently 0.5/1. Discussed with [name], CPP, dosing at this time to remain the same. Repeat labs scheduled two weeks. Resulted labs reviewed. All questions answered. Pt verbalized understanding.

## 2021-07-23 MED FILL — SULFAMETHOXAZOLE 400 MG-TRIMETHOPRIM 80 MG TABLET: ORAL | 84 days supply | Qty: 36 | Fill #2

## 2021-07-30 NOTE — Unmapped (Signed)
[facility] Refill Coordination Note    Specialty Medication(s) to be Shipped:   Transplant: valgancyclovir 450mg     Other medication(s) to be shipped: Test  strips , Terazosin , Pravastatin , Magnesium  , Calcium carbonate  , Azithromycin      Spencer Mallet Sr., DOB: [birth year]  Phone: [phone] (home) [phone] (work)      All above HIPAA information was verified with patient.     Was a Nurse, learning disability used for this call? No    Completed refill call assessment today to schedule patient's medication shipment from the [facility] [phone]).  All relevant notes have been reviewed.     Specialty medication(s) and dose(s) confirmed: Regimen is correct and unchanged.   Changes to medications: Estill reports no changes at this time.  Changes to insurance: No  New side effects reported not previously addressed with a pharmacist or physician: None reported  Questions for the pharmacist: No    Confirmed patient received a Conservation officer, historic buildings and a Surveyor, mining with first shipment. The patient will receive a drug information handout for each medication shipped and additional FDA Medication Guides as required.       DISEASE/MEDICATION-SPECIFIC INFORMATION        N/A    SPECIALTY MEDICATION ADHERENCE     Medication Adherence    Patient reported X missed doses in the last month: 0  Specialty Medication: valGANciclovir 450 mg  Patient is on additional specialty medications: No  Patient is on more than two specialty medications: No  Any gaps in refill history greater than 2 weeks in the last 3 months: no  Demonstrates understanding of importance of adherence: yes              Were doses missed due to medication being on hold? No     valGANciclovir 450 mg -12-14 days of medicine on hand        REFERRAL TO PHARMACIST     Referral to the pharmacist: Not needed      [facility]     Shipping address confirmed in Epic.     Delivery Scheduled: Yes, Expected medication delivery date: 08/04/21.     Medication will be delivered via Next Day Courier to the prescription address in Epic WAM.    Ricci Barker   [facility] Specialty Technician

## 2021-08-03 ENCOUNTER — Ambulatory Visit: Admit: 2021-08-03 | Discharge: 2021-08-04 | Payer: MEDICARE

## 2021-08-03 DIAGNOSIS — Z942 Lung transplant status: Principal | ICD-10-CM

## 2021-08-03 LAB — TACROLIMUS LEVEL: TACROLIMUS BLOOD: 4.2 ng/mL

## 2021-08-03 LAB — COMPREHENSIVE METABOLIC PANEL
ALBUMIN: 3.7 g/dL (ref 3.4–5.0)
ALKALINE PHOSPHATASE: 89 U/L (ref 46–116)
ALT (SGPT): 25 U/L (ref 10–49)
ANION GAP: 6 mmol/L (ref 5–14)
AST (SGOT): 33 U/L (ref <=34)
BILIRUBIN TOTAL: 0.3 mg/dL (ref 0.3–1.2)
BLOOD UREA NITROGEN: 35 mg/dL — ABNORMAL HIGH (ref 9–23)
BUN / CREAT RATIO: 27
CALCIUM: 9.2 mg/dL (ref 8.7–10.4)
CHLORIDE: 108 mmol/L — ABNORMAL HIGH (ref 98–107)
CO2: 28.8 mmol/L (ref 20.0–31.0)
CREATININE: 1.31 mg/dL — ABNORMAL HIGH
EGFR CKD-EPI (2021) MALE: 58 mL/min/{1.73_m2} — ABNORMAL LOW (ref >=60)
GLUCOSE RANDOM: 104 mg/dL (ref 70–179)
POTASSIUM: 4.3 mmol/L (ref 3.4–4.8)
PROTEIN TOTAL: 6 g/dL (ref 5.7–8.2)
SODIUM: 143 mmol/L (ref 135–145)

## 2021-08-03 MED ORDER — TACROLIMUS 1 MG CAPSULE, IMMEDIATE-RELEASE
ORAL_CAPSULE | Freq: Two times a day (BID) | ORAL | 3 refills | 90 days | Status: CP
Start: 2021-08-03 — End: 2022-08-03

## 2021-08-03 MED FILL — PRAVASTATIN 40 MG TABLET: ORAL | 30 days supply | Qty: 30 | Fill #10

## 2021-08-03 MED FILL — ON CALL EXPRESS TEST STRIP: 20 days supply | Qty: 100 | Fill #1

## 2021-08-03 MED FILL — TERAZOSIN 1 MG CAPSULE: ORAL | 30 days supply | Qty: 90 | Fill #11

## 2021-08-03 MED FILL — MAGNESIUM OXIDE 400 MG (241.3 MG MAGNESIUM) TABLET: ORAL | 30 days supply | Qty: 180 | Fill #10

## 2021-08-03 MED FILL — VALGANCICLOVIR 450 MG TABLET: ORAL | 30 days supply | Qty: 30 | Fill #1

## 2021-08-03 MED FILL — CALCIUM CARBONATE 600 MG CALCIUM (1,500 MG) TABLET: ORAL | 30 days supply | Qty: 60 | Fill #3

## 2021-08-03 MED FILL — AZITHROMYCIN 250 MG TABLET: ORAL | 30 days supply | Qty: 30 | Fill #3

## 2021-08-03 NOTE — Unmapped (Signed)
Called [name] to discuss lab results. Tacrolimus level was decreased at 4.2 ( pt confirmed that he did not miss a dose recently and draw time was accurate ) with a goal of 7-9. Medication regimen is currently 0.5mg  in am / 1mg  in pm. Discussed with [name], CPP, dosing at this time increased to 1mg  BID. Repeat labs scheduled this Thursday -- requested to be done at [facility] facility for fast turnaround time. Resulted labs reviewed. All questions answered. Pt verbalized understanding.

## 2021-08-06 ENCOUNTER — Ambulatory Visit: Admit: 2021-08-06 | Discharge: 2021-08-07 | Payer: MEDICARE

## 2021-08-06 DIAGNOSIS — Z942 Lung transplant status: Principal | ICD-10-CM

## 2021-08-06 LAB — COMPREHENSIVE METABOLIC PANEL
ALBUMIN: 3.9 g/dL (ref 3.4–5.0)
ALKALINE PHOSPHATASE: 89 U/L (ref 46–116)
ALT (SGPT): 30 U/L (ref 10–49)
ANION GAP: 7 mmol/L (ref 5–14)
AST (SGOT): 45 U/L — ABNORMAL HIGH (ref <=34)
BILIRUBIN TOTAL: 0.3 mg/dL (ref 0.3–1.2)
BLOOD UREA NITROGEN: 33 mg/dL — ABNORMAL HIGH (ref 9–23)
BUN / CREAT RATIO: 23
CALCIUM: 9.7 mg/dL (ref 8.7–10.4)
CHLORIDE: 107 mmol/L (ref 98–107)
CO2: 29.9 mmol/L (ref 20.0–31.0)
CREATININE: 1.44 mg/dL — ABNORMAL HIGH
EGFR CKD-EPI (2021) MALE: 52 mL/min/{1.73_m2} — ABNORMAL LOW (ref >=60)
GLUCOSE RANDOM: 109 mg/dL (ref 70–179)
POTASSIUM: 4.5 mmol/L (ref 3.4–4.8)
PROTEIN TOTAL: 6.3 g/dL (ref 5.7–8.2)
SODIUM: 144 mmol/L (ref 135–145)

## 2021-08-06 LAB — TACROLIMUS LEVEL: TACROLIMUS BLOOD: 10.5 ng/mL

## 2021-08-06 MED ORDER — TACROLIMUS 0.5 MG CAPSULE, IMMEDIATE-RELEASE
ORAL_CAPSULE | ORAL | 3 refills | 30 days | Status: CP
Start: 2021-08-06 — End: ?

## 2021-08-06 NOTE — Unmapped (Signed)
Called Spencer Pena Sr. to discuss lab results. Tacrolimus level was increased at 10.5 with a goal of 7-9. Medication regimen is currently 1mg  BID. Discussed with [name], CPP, dosing at this time decreased to  1mg  in am / 0.5mg  in pm. Repeat labs scheduled 1 week. Resulted labs reviewed. All questions answered. Pt verbalized understanding.

## 2021-08-11 ENCOUNTER — Ambulatory Visit: Admit: 2021-08-11 | Discharge: 2021-08-12 | Payer: MEDICARE

## 2021-08-11 LAB — COMPREHENSIVE METABOLIC PANEL
ALBUMIN: 3.9 g/dL (ref 3.4–5.0)
ALKALINE PHOSPHATASE: 89 U/L (ref 46–116)
ALT (SGPT): 28 U/L (ref 10–49)
ANION GAP: 5 mmol/L (ref 5–14)
AST (SGOT): 34 U/L (ref <=34)
BILIRUBIN TOTAL: 0.4 mg/dL (ref 0.3–1.2)
BLOOD UREA NITROGEN: 34 mg/dL — ABNORMAL HIGH (ref 9–23)
BUN / CREAT RATIO: 23
CALCIUM: 9.8 mg/dL (ref 8.7–10.4)
CHLORIDE: 107 mmol/L (ref 98–107)
CO2: 30.8 mmol/L (ref 20.0–31.0)
CREATININE: 1.5 mg/dL — ABNORMAL HIGH
EGFR CKD-EPI (2021) MALE: 49 mL/min/{1.73_m2} — ABNORMAL LOW (ref >=60)
GLUCOSE RANDOM: 104 mg/dL (ref 70–179)
POTASSIUM: 4.5 mmol/L (ref 3.4–4.8)
PROTEIN TOTAL: 6.6 g/dL (ref 5.7–8.2)
SODIUM: 143 mmol/L (ref 135–145)

## 2021-08-11 LAB — TACROLIMUS LEVEL: TACROLIMUS BLOOD: 6.7 ng/mL

## 2021-08-12 DIAGNOSIS — Z942 Lung transplant status: Principal | ICD-10-CM

## 2021-08-12 MED ORDER — PEN NEEDLE, DIABETIC 32 GAUGE X 5/32" (4 MM)
SUBCUTANEOUS | 11 refills | 30 days | Status: CP
Start: 2021-08-12 — End: ?

## 2021-08-12 MED ORDER — MONTELUKAST 10 MG TABLET
ORAL_TABLET | Freq: Every day | ORAL | 2 refills | 30 days | Status: CP
Start: 2021-08-12 — End: 2022-08-12
  Filled 2021-08-13: qty 30, 30d supply, fill #0

## 2021-08-12 NOTE — Unmapped (Signed)
Called Spencer Mallet Sr. to discuss lab results. Tacrolimus level was normal at 6.7 with a goal of 7-9. Medication regimen is currently 1/0.5. Dosing at this time to remain the same. Repeat labs scheduled next week with clinic. Resulted labs reviewed. All questions answered. Pt verbalized understanding.

## 2021-08-12 NOTE — Unmapped (Signed)
Pt request for RX Refill   pen needle, diabetic (BD ULTRA-FINE NANO PEN NEEDLE) 32 gauge x 5/32 (4 mm) Ndle

## 2021-08-13 MED FILL — PENTIPS 32 GAUGE X 5/32" NEEDLE (4 MM): SUBCUTANEOUS | 40 days supply | Qty: 200 | Fill #0

## 2021-08-13 MED FILL — AZATHIOPRINE 50 MG TABLET: ORAL | 90 days supply | Qty: 90 | Fill #3

## 2021-08-17 NOTE — Unmapped (Signed)
Spencer Mallet Sr. presents today for annual wellness visit.  No current complaints.      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    - EKG (Yearly): 05/2020, due repeat  - ABIs (5 years post txp, every other year if normal): 02/2020, due repeat  - Carotid Dopplers (5 years post txp, every other year if normal): 02/2020, due repeat  - AAA screening (former smokers, once >50): 12/2016 with no AAA.  - DEXA (yearly- every other year): 02/2020 with osteopenia, due repeat  - Dermatology (yearly): Needs to see, he will look into name of local provider.  - Opthalmology for dilated eye exam (diabetics): N/A  - Foot exam (diabetics): N/A    Males -->  PSA: 1.27 (05/2021)    Lifestyle Questions:  - Tobacco Use: None (quit 1991)  - Pets: Cats, discussed avoiding litter box  - Open wounds and tap water:   - Travel Plans: Mask and hand sanitizer  - Sun exposure: Discussed  - Tick bite avoidance/outdoor activities: Discussed    Labs:  - Lipid Panel: 05/2021 with LDL 50  - Hemoglobin A1c: 5.1 (11/2020), due  - Vitamin D: 66.9 (05/2021)  - TSH/free T4 02/2020, due repeat  - Iron studies (anemic patients): 07/2020, due repeat  - UA/microalbumin (diabetics): N/A  - Vitamin A (CF patients): N/A  - Vitamin E (CF patients): N/A  - Vitamin K (CF patients): N/A  - CRP (CF patients): N/A    Vaccines:  - Influenza: 03/2021  - COVID: needs bivalent  - Prevnar 20/PNA 23: 12/2017  - PCV13: 03/2016  - Shingrix: 08/2020  - TdaP: 02/2018  - Gardisil: N/A    Spencer Pimple MSN, FNP-C  August 17, 2021 10:43 AM  Lung Transplant Nurse Practitioner  Pager [phone]

## 2021-08-18 ENCOUNTER — Ambulatory Visit: Admit: 2021-08-18 | Discharge: 2021-08-18 | Payer: MEDICARE | Attending: Clinical | Primary: Clinical

## 2021-08-18 ENCOUNTER — Ambulatory Visit: Admit: 2021-08-18 | Discharge: 2021-08-18 | Payer: MEDICARE

## 2021-08-18 ENCOUNTER — Ambulatory Visit: Admit: 2021-08-18 | Discharge: 2021-08-18 | Payer: MEDICARE | Attending: Family | Primary: Family

## 2021-08-18 ENCOUNTER — Ambulatory Visit: Admit: 2021-08-18 | Discharge: 2021-08-18 | Payer: MEDICARE | Attending: Registered" | Primary: Registered"

## 2021-08-18 ENCOUNTER — Institutional Professional Consult (permissible substitution): Admit: 2021-08-18 | Discharge: 2021-08-18 | Payer: MEDICARE

## 2021-08-18 ENCOUNTER — Encounter: Admit: 2021-08-18 | Discharge: 2021-08-18 | Payer: MEDICARE | Attending: Internal Medicine | Primary: Internal Medicine

## 2021-08-18 DIAGNOSIS — Z942 Lung transplant status: Principal | ICD-10-CM

## 2021-08-18 DIAGNOSIS — Z7952 Long term (current) use of systemic steroids: Principal | ICD-10-CM

## 2021-08-18 LAB — COMPREHENSIVE METABOLIC PANEL
ALBUMIN: 4 g/dL (ref 3.4–5.0)
ALKALINE PHOSPHATASE: 90 U/L (ref 46–116)
ALT (SGPT): 33 U/L (ref 10–49)
ANION GAP: 7 mmol/L (ref 5–14)
AST (SGOT): 38 U/L — ABNORMAL HIGH (ref <=34)
BILIRUBIN TOTAL: 0.3 mg/dL (ref 0.3–1.2)
BLOOD UREA NITROGEN: 41 mg/dL — ABNORMAL HIGH (ref 9–23)
BUN / CREAT RATIO: 29
CALCIUM: 9.6 mg/dL (ref 8.7–10.4)
CHLORIDE: 106 mmol/L (ref 98–107)
CO2: 31 mmol/L (ref 20.0–31.0)
CREATININE: 1.42 mg/dL — ABNORMAL HIGH
EGFR CKD-EPI (2021) MALE: 53 mL/min/{1.73_m2} — ABNORMAL LOW (ref >=60)
GLUCOSE RANDOM: 99 mg/dL (ref 70–99)
POTASSIUM: 4.8 mmol/L (ref 3.4–4.8)
PROTEIN TOTAL: 6.7 g/dL (ref 5.7–8.2)
SODIUM: 144 mmol/L (ref 135–145)

## 2021-08-18 LAB — CBC W/ AUTO DIFF
BASOPHILS ABSOLUTE COUNT: 0 10*9/L (ref 0.0–0.1)
BASOPHILS RELATIVE PERCENT: 0.9 %
EOSINOPHILS ABSOLUTE COUNT: 0 10*9/L (ref 0.0–0.5)
EOSINOPHILS RELATIVE PERCENT: 0.8 %
HEMATOCRIT: 34.5 % — ABNORMAL LOW (ref 39.0–48.0)
HEMOGLOBIN: 11.5 g/dL — ABNORMAL LOW (ref 12.9–16.5)
LYMPHOCYTES ABSOLUTE COUNT: 0.6 10*9/L — ABNORMAL LOW (ref 1.1–3.6)
LYMPHOCYTES RELATIVE PERCENT: 12.3 %
MEAN CORPUSCULAR HEMOGLOBIN CONC: 33.3 g/dL (ref 32.0–36.0)
MEAN CORPUSCULAR HEMOGLOBIN: 29.8 pg (ref 25.9–32.4)
MEAN CORPUSCULAR VOLUME: 89.3 fL (ref 77.6–95.7)
MEAN PLATELET VOLUME: 7.3 fL (ref 6.8–10.7)
MONOCYTES ABSOLUTE COUNT: 0.4 10*9/L (ref 0.3–0.8)
MONOCYTES RELATIVE PERCENT: 8 %
NEUTROPHILS ABSOLUTE COUNT: 3.6 10*9/L (ref 1.8–7.8)
NEUTROPHILS RELATIVE PERCENT: 78 %
PLATELET COUNT: 155 10*9/L (ref 150–450)
RED BLOOD CELL COUNT: 3.87 10*12/L — ABNORMAL LOW (ref 4.26–5.60)
RED CELL DISTRIBUTION WIDTH: 14.3 % (ref 12.2–15.2)
WBC ADJUSTED: 4.6 10*9/L (ref 3.6–11.2)

## 2021-08-18 LAB — CMV DNA, QUANTITATIVE, PCR: CMV VIRAL LD: NOT DETECTED

## 2021-08-18 LAB — TACROLIMUS LEVEL, TROUGH: TACROLIMUS, TROUGH: 5.9 ng/mL (ref 5.0–15.0)

## 2021-08-18 LAB — PHOSPHORUS: PHOSPHORUS: 3.5 mg/dL (ref 2.4–5.1)

## 2021-08-18 LAB — MAGNESIUM: MAGNESIUM: 1.7 mg/dL (ref 1.6–2.6)

## 2021-08-18 LAB — EBV QUANTITATIVE PCR, BLOOD: EBV VIRAL LOAD RESULT: NOT DETECTED

## 2021-08-18 LAB — IGG: GAMMAGLOBULIN; IGG: 690 mg/dL (ref 646–2013)

## 2021-08-18 MED ORDER — PREDNISONE 5 MG TABLET
ORAL_TABLET | Freq: Every day | ORAL | 3 refills | 90 days | Status: CP
Start: 2021-08-18 — End: ?
  Filled 2021-09-01: qty 135, 90d supply, fill #0

## 2021-08-18 MED ORDER — SITAGLIPTIN PHOSPHATE 50 MG TABLET
ORAL_TABLET | Freq: Every day | ORAL | 11 refills | 30 days | Status: CP
Start: 2021-08-18 — End: 2022-08-18
  Filled 2021-08-18: qty 30, 30d supply, fill #0

## 2021-08-18 NOTE — Unmapped (Signed)
In with Dr. Dudley Major to see Spencer Mallet Sr..  Doing well overall, PFTs stable.  Advised to use Biotin and Coq10 for skin bruising.  Has AWV this afternoon.  Return to clinic in 3 months.  Questions answered, verbalized understanding.

## 2021-08-18 NOTE — Unmapped (Signed)
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 is a 71 y.o. male seen for medical nutrition therapy. His active problem list, medication list, allergies, notes from last several encounters, lab results, endoscopy procedure notes, and imaging were reviewed.     Working in the yard and walking. Doing great. Walking up to 2 miles per day. Working in the garden (wearing sunscreen, mask, gloves and long sleeves). Shoveling compost. I have been eating a whole lot.     Nutrition History:   Dietary Restrictions: No known food allergies or food intolerances.  He follows a post-transplant diet.     Gastrointestinal Issues: Denied issues    Hunger and Satiety: Denied issues.     Diet Recall:   Time Intake   Breakfast Bacon and eggs with fried potatoes and onions; sweet roll;    Lunch Pork chops, chicken or roast with starch (potato, sweet potato or rice) and butter beans, green beans, black-eyed peas, corn, turnip greens   Dinner Peanut butter cracker, apple and banana     Food-Related History:  Snacks:  fun size candy bar 2-3 per night; bakery cookies (oatmeal raisin, chocolate chip)  Beverages:  coffee, water, Premier Protein shakes (2 per day); Diet Pepsi, unsweet tea  Dining Out/Bringing In:  once a week or once every two weeks  Other intake: none mentioned   Alcohol intake:  occasional hard liquor (a couple of times per week) just a sip; a couple of beers per week    Chewing/Swallowing: none  Reflux: denies  Diabetes: checking 3 times per day; brought blood glucose log - average under 170 mg/dL  Osteoporosis: not repeated since transplant  Stooling Pattern: Twice daily  Colonoscopy Hx: last done prior to transplant  Physical Activity: doing a lot of yard work, walking,     Anthropometrics   Height: 168.7 cm (5' 6.42)   Weight: 77.4 kg (170 lb 10.2 oz)  body mass index is 27.2 kg/m??.  Usual body weight: 180#    Ideal Body Weight: 65.62 kg  % Ideal Body Weight: 118%  Adjusted Body Weight: n/a     Wt Readings from Last 10 Encounters:   08/18/21 77.4 kg (170 lb 10.2 oz)   08/18/21 79.5 kg (175 lb 3.2 oz)   08/18/21 79.5 kg (175 lb 3.2 oz)   05/20/21 74.8 kg (164 lb 14.5 oz)   05/15/21 74.8 kg (165 lb)   04/06/21 74.5 kg (164 lb 3.2 oz)   02/03/21 75 kg (165 lb 6.4 oz)   12/31/20 75.2 kg (165 lb 11.2 oz)   12/03/20 74.2 kg (163 lb 8 oz)   12/03/20 74.2 kg (163 lb 8 oz)     Nutrition Risk Screening:   Nutrition Focused Physical Exam:               Nutrition Evaluation  Overall Impressions: Nutrition-Focused Physical Exam not indicated due to lack of malnutrition risk factors. (08/18/21 1448)     Malnutrition Screening:   Patient does not meet AND/ASPEN criteria for malnutrition at this time (08/18/21 1448)        Modified Fried Frailty Assessment  Frailty Criteria Assessment Score   Weight Loss ____5____lbs in one year 0   Exhaustion Number of days __0____ 0  Physical Activity Rating from 10-100:___100_________ 0   Walk Time Speed: __2.7____ seconds 0   Grip Strength Average: ____37.4_____ kg 0   Total  0                                              3 or more = Frail                                             1 or 2 = Pre-Frail                                                 0 = Not Frail  Fried LP, Tangen CM, Walston J, et al. Frailty in older adults: evidence for a phenotype. J Gerontol Med Sci. 2001;56A:M146-M156.     Hand Grip Strength  Hand Grip Strength - Jamar  Date 08/18/21   Right  Result   Measure 1 36.2   Measure 2 38.5   Measure 3 37.5   Test Average 37.4   Standard Deviation 0.9   Coefficient of Variation 0   Normal or Weak WNL     Normative Grip Strength Expected: Rt- 24.4-44 kg    Biochemical Data, Medical Tests and Procedures:  All pertinent labs and imaging reviewed by Lillia Corporal at 12:51 PM 08/21/2021.    Lab Results Component Value Date    Hemoglobin A1C 5.1 12/03/2020    Hemoglobin A1C 6.0 (H) 06/13/2020    Hemoglobin A1C 6.5 (H) 05/15/2020    Hemoglobin A1C 7.1 (A) 12/19/2015    Hemoglobin A1C 7.1 (A) 12/18/2014    Hemoglobin A1C 7.4 (H) 07/26/2014    Hemoglobin A1C 7.1 (H) 01/02/2014    Hemoglobin A1C 6.5 (H) 03/23/2013      Lab Results   Component Value Date    Vitamin D Total (25OH) 66.9 05/15/2021    Vitamin D Total (25OH) 51.2 07/18/2020    Vitamin D Total (25OH) 27.0 05/24/2020     No results found for: ZINC  No results found for: COPPER  Lab Results   Component Value Date    CRP 6.1 09/26/2018       Medications and Vitamin/Mineral Supplementation:   All nutritionally pertinent medications reviewed on 08/21/2021.   Nutritionally pertinent medications include: as below  He is taking nutrition supplements. Premier Protein BID    Current Outpatient Medications   Medication Sig Dispense Refill    acetaminophen (TYLENOL EXTRA STRENGTH) 500 MG tablet Take 1-2 tablets (500-1,000 mg total) by mouth every six (6) hours as needed for pain. 180 tablet PRN    aspirin (ECOTRIN) 81 MG tablet Take 1 tablet (81 mg total) by mouth daily. 90 tablet 3    azaTHIOprine (IMURAN) 50 mg tablet Take 1 tablet (50 mg total) by mouth daily. 90 tablet 3    azithromycin (ZITHROMAX) 250 MG tablet Take 1 tablet (250 mg total) by mouth daily. 30 tablet 11    blood sugar diagnostic (ON CALL EXPRESS TEST STRIP) Strp Test daily before all meals/snacks and once before bedtime. 100 each 11    calcium carbonate (CALCIUM 600)  1,500 mg (600 mg elem calcium) tablet Take 1 tablet (600 mg of elem calcium total) by mouth Two (2) times a day. 60 tablet 11    cholecalciferol, vitamin D3 25 mcg, 1,000 units,, 1,000 unit (25 mcg) tablet Take 2 tablets (50 mcg total) by mouth daily. 100 tablet 11    fluticasone propion-salmeteroL (ADVAIR HFA) 115-21 mcg/actuation inhaler Inhale 2 puffs Two (2) times a day. 12 g 11    insulin glargine (BASAGLAR, LANTUS) 100 unit/mL (3 mL) injection pen Inject 0.12 mL (12 Units total) under the skin nightly. 15 mL 3    lancets 30 gauge Misc Test daily before all meals/snacks and once before bedtime. 100 each 11    magnesium oxide (MAG-OX) 400 mg (241.3 mg elemental magnesium) tablet Take 3 tablets (1,200 mg total) by mouth Two (2) times a day. 180 tablet 11    metFORMIN (GLUCOPHAGE) 1000 MG tablet Take 1 tablet (1,000 mg total) by mouth Two (2) times a day. 180 tablet 3    montelukast (SINGULAIR) 10 mg tablet Take 1 tablet (10 mg total) by mouth daily. 30 tablet 2    pantoprazole (PROTONIX) 40 MG tablet Take 1 tablet (40 mg total) by mouth daily. 90 tablet 3    pen needle, diabetic (BD ULTRA-FINE NANO PEN NEEDLE) 32 gauge x 5/32 (4 mm) Ndle Use as directed for injections five (5) times a day. 150 each 11    pravastatin (PRAVACHOL) 40 MG tablet Take 1 tablet (40 mg total) by mouth daily. 30 tablet 11    predniSONE (DELTASONE) 5 MG tablet Take 1.5 tablets (7.5 mg total) by mouth daily. 135 tablet 3    SITagliptin phosphate (JANUVIA) 50 MG tablet Take 1 tablet (50 mg total) by mouth daily. 30 tablet 11    sulfamethoxazole-trimethoprim (BACTRIM) 400-80 mg per tablet Take 1 tablet (80 mg of trimethoprim total) by mouth Every Monday, Wednesday, and Friday. 36 tablet 3    tacrolimus (PROGRAF) 0.5 MG capsule Take 2 capsules (1 mg total) by mouth daily AND 1 capsule (0.5 mg total) nightly. 90 capsule 3    tacrolimus (PROGRAF) 1 MG capsule ON HOLD 180 capsule 3    terazosin (HYTRIN) 1 MG capsule Take 3 capsules (3 mg total) by mouth nightly. 90 capsule 11    valGANciclovir (VALCYTE) 450 mg tablet Take 1 tablet (450 mg total) by mouth daily. 30 tablet 11     No current facility-administered medications for this visit.     Daily Estimated Nutritional Needs:  Energy: 1925-2310 kcals [25-30 kcal/kg using last recorded weight, 77 kg (08/21/21 2356)]  Protein: 77-92 gm [1.0-1.2 gm/kg using last recorded weight, 77 kg (08/21/21 2356)]  Fluid: 2310 [ ]     Nutrition Goals & Evaluation    1. Meet estimated daily needs  (Ongoing)  2. Normal Vitamin D levels -  (Ongoing)  3. Controlled blood glucose levels - (Ongoing)  4. Prevention/improvment of malnutrition -  (Ongoing)    Nutrition goals reviewed, and relevant barriers identified and addressed: none evident. He is evaluated to have excellent willingness and ability to achieve nutrition goals.     Nutrition Assessment     Patient presents for his annual wellness visit. Overall patient doing well nutritionally. Most recent a1c shows excellent blood glucose control. Vitamin D WNL. Remains gaining weight post-transplant. Patient is not frail or malnourished. Patient diet is higher in processed foods than desired. He is not due for his first colonoscopy post-transplant yet. Will be due for his first  post-transplant DEXA within the next year. Likely needs repeat colonoscopy this year as last done 2018 and history of colon cancer pre-transplant. Obtaining a lot of his protein intake from supplements. May need to alter type and frequency if any worsening of kidney function.     Nutrition Intervention      - Meals and Snacks    Nutrition Plan:   Consider decreasing intake of processed foods. Example: sweet roll for breakfast.     Follow up: as needed    Food/Nutrition-related history, Anthropometric measurements, Biochemical data, medical tests, procedures, Nutrition-focused physical findings, and Effectiveness of nutrition interventions will be assessed at time of follow-up.     Recommendations for Care Team :  Patient due for DEXA within next year  First post-transplant colonoscopy likely due this year as last done 2018 and now post-transplant     I spent 26 minutes face-to-face with Patient.     Nolon Nations, MS, RD, LD, CCTD  Heart/Lung Transplant, VAD Dietitian  pgr [phone]; ph [phone]

## 2021-08-18 NOTE — Unmapped (Signed)
Pt ID verified with Name and Date of birth. All screening questions answered. Vaccine Information Sheet given to patient prior to injection.   COVID Vaccine administered as ordered. See Immunization history for documentation. Pt tolerated the injection(s) well with no issues noted.

## 2021-08-18 NOTE — Unmapped (Signed)
Transplant Pharmacy Annual Wellness Visit    Saw Spencer STILLE Sr. as a part of their lung transplant annual wellness visit.     Reviewed current medication list and updated with all appropriate prescription, non-prescription, and herbal products currently being taken.    Outpatient Encounter Medications as of 08/18/2021   Medication Sig Dispense Refill   ??? acetaminophen (TYLENOL EXTRA STRENGTH) 500 MG tablet Take 1-2 tablets (500-1,000 mg total) by mouth every six (6) hours as needed for pain. 180 tablet PRN   ??? aspirin (ECOTRIN) 81 MG tablet Take 1 tablet (81 mg total) by mouth daily. 90 tablet 3   ??? azaTHIOprine (IMURAN) 50 mg tablet Take 1 tablet (50 mg total) by mouth daily. 90 tablet 3   ??? azithromycin (ZITHROMAX) 250 MG tablet Take 1 tablet (250 mg total) by mouth daily. 30 tablet 11   ??? blood sugar diagnostic (ON CALL EXPRESS TEST STRIP) Strp Test daily before all meals/snacks and once before bedtime. 100 each 11   ??? calcium carbonate (CALCIUM 600) 1,500 mg (600 mg elem calcium) tablet Take 1 tablet (600 mg of elem calcium total) by mouth Two (2) times a day. 60 tablet 11   ??? cholecalciferol, vitamin D3 25 mcg, 1,000 units,, 1,000 unit (25 mcg) tablet Take 2 tablets (50 mcg total) by mouth daily. 100 tablet 11   ??? fluticasone propion-salmeteroL (ADVAIR HFA) 115-21 mcg/actuation inhaler Inhale 2 puffs Two (2) times a day. 12 g 11   ??? insulin ASPART (NOVOLOG FLEXPEN U-100 INSULIN) 100 unit/mL (3 mL) injection pen Inject 7 units with breakfast (10 units with high sugar breakfast), 7 units with afternoon meal, and 3 units with evening snack plus sliding scale 2 units for every 50 >150; max 50 units/day 15 mL 2   ??? insulin glargine (BASAGLAR, LANTUS) 100 unit/mL (3 mL) injection pen Inject 0.12 mL (12 Units total) under the skin nightly. 15 mL 3   ??? lancets 30 gauge Misc Test daily before all meals/snacks and once before bedtime. 100 each 11   ??? magnesium oxide (MAG-OX) 400 mg (241.3 mg elemental magnesium) tablet Take 3 tablets (1,200 mg total) by mouth Two (2) times a day. 180 tablet 11   ??? metFORMIN (GLUCOPHAGE) 1000 MG tablet Take 1 tablet (1,000 mg total) by mouth Two (2) times a day. 180 tablet 3   ??? montelukast (SINGULAIR) 10 mg tablet Take 1 tablet (10 mg total) by mouth daily. 30 tablet 2   ??? oseltamivir (TAMIFLU) 30 MG capsule Take 1 capsule (30 mg total) by mouth daily. 30 capsule 3   ??? pantoprazole (PROTONIX) 40 MG tablet Take 1 tablet (40 mg total) by mouth daily. 90 tablet 3   ??? pen needle, diabetic (BD ULTRA-FINE NANO PEN NEEDLE) 32 gauge x 5/32 (4 mm) Ndle Use as directed for injections five (5) times a day. 150 each 11   ??? pravastatin (PRAVACHOL) 40 MG tablet Take 1 tablet (40 mg total) by mouth daily. 30 tablet 11   ??? predniSONE (DELTASONE) 5 MG tablet Take 1.5 tablets (7.5 mg total) by mouth daily. 60 tablet 11   ??? predniSONE (DELTASONE) 5 MG tablet Take 1.5 tablets (7.5 mg total) by mouth daily. 135 tablet 3   ??? sulfamethoxazole-trimethoprim (BACTRIM) 400-80 mg per tablet Take 1 tablet (80 mg of trimethoprim total) by mouth Every Monday, Wednesday, and Friday. 36 tablet 3   ??? tacrolimus (PROGRAF) 0.5 MG capsule Take 2 capsules (1 mg total) by mouth daily AND 1  capsule (0.5 mg total) nightly. 90 capsule 3   ??? tacrolimus (PROGRAF) 1 MG capsule ON HOLD 180 capsule 3   ??? terazosin (HYTRIN) 1 MG capsule Take 3 capsules (3 mg total) by mouth nightly. 90 capsule 11   ??? valGANciclovir (VALCYTE) 450 mg tablet Take 1 tablet (450 mg total) by mouth daily. 30 tablet 11     No facility-administered encounter medications on file as of 08/18/2021.       Discussed the following medication changes:  - stop mealtime insulin - add sitagliptan 50mg  daily to replace given excellent BG control, low mealtime regimen, and recent hypoglycemia    Patient is currently using the following pharmacy(s) for their medications: [facility]athioprine; [facility] for all others  They are not having challenges with medication access.  They are not having challenge with medication cost.    MARS-5 administered; the patient had a total score of 30 indicating excellent adherence to medications.    Discussed the following medication adherence strategies:  - none - using pill box successfully    All questions answered.    Letha Cape, PharmD, BCPS, CPP

## 2021-08-18 NOTE — Unmapped (Signed)
LUNG TRANSPLANT SOCIAL WORK NOTE      ANNUAL WELLNESS VISIT:  Spencer Pena today for annual transplant wellness visit (05/15/2020 (Lung). Wife Spencer Pena was with him.    Spencer Pena reports that he is doing great. He has been walking 2 miles a day, tilling his garden, spreading mulch, and enjoying himself. Wife asked if most transplant patients are as active as he, and should he cut back. Assured them that after the sternum heals there are no activity restrictions, and that this level of activity (since he wears a mask while working outside) is good for him physically and mentally. 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 He'd love to be working again.    Spencer Pena and Spencer Pena are enjoying their grandchildren (ages 1 and 4) and doing a bit of traveling. Spencer Pena loves the mountains, going to baseball games, and checking out flea markets.    MENTAL HEALTH SCREENING:    PHQ-9 Score:  PHQ-9 TOTAL SCORE: 0    GAD-7 Score:  GAD-7 Total Score: 0    SOCIAL DETERMINANTS OF HEALTH:    Social Determinants of Health     Financial Resource Strain: Low Risk    ??? Difficulty of Paying Living Expenses: Not hard at all   Internet Connectivity: Internet connectivity concern identified   ??? Do you have access to internet services: No   ??? How do you connect to the internet: Not on file   ??? Is your internet connection strong enough for you to watch video on your device without major problems?: Not on file   ??? Do you have enough data to get through the month?: Not on file   ??? Do you have enough data to get through the month?: Not on file   Food Insecurity: No Food Insecurity   ??? Worried About Running Out of Food in the Last Year: Never true   ??? Ran Out of Food in the Last Year: Never true   Tobacco Use: Medium Risk   ??? Smoking Tobacco Use: Former   ??? Smokeless Tobacco Use: Former   ??? Passive Exposure: Never   Housing/Utilities: Low Risk    ??? Within the past 12 months, have you ever stayed: outside, in a car, in a tent, in an overnight shelter, or temporarily in someone else's home (i.e. couch-surfing)?: No   ??? Are you worried about losing your housing?: No   ??? Within the past 12 months, have you been unable to get utilities (heat, electricity) when it was really needed?: No   Alcohol Use: Not At Risk   ??? How often do you have a drink containing alcohol?: 2 - 3 times per week   ??? How many drinks containing alcohol do you have on a typical day when you are drinking?: 1 - 2   ??? How often do you have 5 or more drinks on one occasion?: Never   Transportation Needs: No Transportation Needs   ??? Lack of Transportation (Medical): No   ??? Lack of Transportation (Non-Medical): No   Substance Use: Low Risk    ??? Taken prescription drugs for non-medical reasons: Never   ??? Taken illegal drugs: Never   ??? Patient indicated they have taken drugs in the past year for non-medical reasons: Yes, [positive answer(s)]: Not on file   Health Literacy: Medium Risk   ??? : Rarely   Physical Activity: Sufficiently Active   ??? Days of  Exercise per Week: 7 days   ??? Minutes of Exercise per Session: 60 min   Interpersonal Safety: Not at risk   ??? Unsafe Where You Currently Live: No   ??? Physically Hurt by Anyone: No   ??? Abused by Anyone: No   Stress: No Stress Concern Present   ??? Feeling of Stress : Not at all   Intimate Partner Violence: Not At Risk   ??? Fear of Current or Ex-Partner: No   ??? Emotionally Abused: No   ??? Physically Abused: No   ??? Sexually Abused: No   Depression: Not at risk   ??? PHQ-2 Score: 0   Social Connections: Moderately Isolated   ??? Frequency of Communication with Friends and Family: More than three times a week   ??? Frequency of Social Gatherings with Friends and Family: Once a week   ??? Attends Religious Services: Never   ??? Active Member of Clubs or Organizations: No   ??? Attends Banker Meetings: Never   ??? Marital Status: Married        ADVANCE CARE PLANNING:  Daily says he has completed an advance directive. He wants his wife to be his healthcare decision maker if he is unable to speak for himself.     ASSESSMENT:  Spencer Pena seems to be doing very well, now a little over a year post-lung transplant. He does not endorse any symptoms of clinical depression or anxiety. He is moderately isolated on purpose, due to concerns for infection. There are no social determinants of health issues that require attention.    PLAN:  I remain available for support and resource information as needed.     Met with patient in an exam room in the [facility].      [name], LCSW, CCTSW  Transplant Case Manager  August 18, 2021 1:43 PM

## 2021-08-18 NOTE — Unmapped (Signed)
Pulmonary Transplant Clinic    HISTORY:   HPI:  Mr. Spencer Pena is a 71 y.o.  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 Walking 2.5 miles per day without dyspnea. Unloaded 4 bed-loads of compost into his garden the other day without dyspnea. No cough.  - No GERD.  - Still with easy bruising.   - Some minor tremor, no HA, no diarrhea.     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     --> basiliximab induction    --> CMV high risk (D+/R-)    --> EBV moderate risk (D+/R+)    --> Not Increased Risk Donor  - Colon Cancer 2007    --> s/p resection of liver mets    --> s/p resection of 6 inches of colon    --> FOLFOX Chemo  - type 2 diabetes mellitus     --> not insulin dependent  - OA    SOCIAL HISTORY:   - 20pk years, quit in 1991  - previously drank about 10 drinks per week   - Worked maintenance at [employer]  - Married and lives in [place]    FAMILY HISTORY:   - Parents lived into late 90's    MEDS: (personally reviewed in [place], pertinent meds noted below)     Current Outpatient Medications on File Prior to Visit   Medication Sig Dispense Refill   ??? acetaminophen (TYLENOL EXTRA STRENGTH) 500 MG tablet Take 1-2 tablets (500-1,000 mg total) by mouth every six (6) hours as needed for pain. 180 tablet PRN   ??? aspirin (ECOTRIN) 81 MG tablet Take 1 tablet (81 mg total) by mouth daily. 90 tablet 3   ??? azaTHIOprine (IMURAN) 50 mg tablet Take 1 tablet (50 mg total) by mouth daily. 90 tablet 3   ??? azithromycin (ZITHROMAX) 250 MG tablet Take 1 tablet (250 mg total) by mouth daily. 30 tablet 11   ??? blood sugar diagnostic (ON CALL EXPRESS TEST STRIP) Strp Test daily before all meals/snacks and once before bedtime. 100 each 11   ??? calcium carbonate (CALCIUM 600) 1,500 mg (600 mg elem calcium) tablet Take 1 tablet (600 mg of elem calcium total) by mouth Two (2) times a day. 60 tablet 11   ??? cholecalciferol, vitamin D3 25 mcg, 1,000 units,, 1,000 unit (25 mcg) tablet Take 2 tablets (50 mcg total) by mouth daily. 100 tablet 11   ??? fluticasone propion-salmeteroL (ADVAIR HFA) 115-21 mcg/actuation inhaler Inhale 2 puffs Two (2) times a day. 12 g 11   ??? insulin ASPART (NOVOLOG FLEXPEN U-100 INSULIN) 100 unit/mL (3 mL) injection pen Inject 7 units with breakfast (10 units with high sugar breakfast), 7 units with afternoon meal, and 3 units with evening snack plus sliding scale 2 units for every 50 >150; max 50 units/day 15 mL 2   ??? insulin glargine (BASAGLAR, LANTUS) 100 unit/mL (3 mL) injection pen Inject 0.12 mL (12 Units total) under the skin nightly. 15 mL 3   ??? lancets 30 gauge Misc Test daily before all meals/snacks and once before bedtime. 100 each 11   ??? magnesium oxide (MAG-OX) 400 mg (241.3 mg elemental magnesium) tablet Take 3 tablets (1,200 mg total) by mouth Two (2) times a day. 180 tablet 11   ???  metFORMIN (GLUCOPHAGE) 1000 MG tablet Take 1 tablet (1,000 mg total) by mouth Two (2) times a day. 180 tablet 3   ??? montelukast (SINGULAIR) 10 mg tablet Take 1 tablet (10 mg total) by mouth daily. 30 tablet 2   ??? oseltamivir (TAMIFLU) 30 MG capsule Take 1 capsule (30 mg total) by mouth daily. 30 capsule 3   ??? pantoprazole (PROTONIX) 40 MG tablet Take 1 tablet (40 mg total) by mouth daily. 90 tablet 3   ??? pen needle, diabetic (BD ULTRA-FINE NANO PEN NEEDLE) 32 gauge x 5/32 (4 mm) Ndle Use as directed for injections five (5) times a day. 150 each 11   ??? pravastatin (PRAVACHOL) 40 MG tablet Take 1 tablet (40 mg total) by mouth daily. 30 tablet 11   ??? predniSONE (DELTASONE) 5 MG tablet Take 1.5 tablets (7.5 mg total) by mouth daily. 60 tablet 11   ??? predniSONE (DELTASONE) 5 MG tablet Take 1.5 tablets (7.5 mg total) by mouth daily. 135 tablet 3   ??? sulfamethoxazole-trimethoprim (BACTRIM) 400-80 mg per tablet Take 1 tablet (80 mg of trimethoprim total) by mouth Every Monday, Wednesday, and Friday. 36 tablet 3   ??? tacrolimus (PROGRAF) 0.5 MG capsule Take 2 capsules (1 mg total) by mouth daily AND 1 capsule (0.5 mg total) nightly. 90 capsule 3   ??? tacrolimus (PROGRAF) 1 MG capsule ON HOLD 180 capsule 3   ??? terazosin (HYTRIN) 1 MG capsule Take 3 capsules (3 mg total) by mouth nightly. 90 capsule 11   ??? valGANciclovir (VALCYTE) 450 mg tablet Take 1 tablet (450 mg total) by mouth daily. 30 tablet 11     No current facility-administered medications on file prior to visit.       OBJECTIVE DATA:   PHYSICAL EXAM:  BP 133/69  - Pulse 88  - Temp 37.2 ??C (99 ??F) (Tympanic)  - Wt 79.5 kg (175 lb 3.2 oz)  - SpO2 98%  - BMI 28.19 kg/m??      Gen: - awake, alert, in NAD  Derm: - easy bruising on BL arms  Vascular: - good pulses throughout,   CV: - RRR, no m/r/g  Pulm: - CTAB, good air movement BL   Abd: - soft, NT, ND, no hepatosplenomegally  Ext: - no edema of LE, no clubbing   Neuro: - Alert, oriented, follows commands. Moving all extremities spontaneously    LABS: (reviewed in Epic, pertinent values noted below)   PFTs: (personally reviewed and interpereted):    Date: FVC (% Pred) FEV1 (% Pred) FEF25-75(% Pred) DLCO TBBx/Results                                   08/18/21 4.01 (109%) 3.55 (127%)      05/15/2021 4.21(114%) 3.65 (130.3%) 5.54 (254%)     04/06/2021 4.08 (111%) 3.64 (129.6%) 6.01 (274.3%)     12/31/20 3.95 (107.1%) 3.46 (122.6%) 5.20 (236.4%)     12/03/2020 3.87 (104.9%) 3.47 (122.9%) 5.69 (258.3%)     10/03/2020 3.68 (99.4) 3.13 (110.6)  4.22 (190.6)      09/05/20  3.70  3.14      08/20/20 3.65 (98%) 3.15 (111%) 4.69 (211%)      08/01/20 3.64 (98.2%) 3.10 (109.3%) 4.41 (198.8%)     07/18/20 3.49 (94%) 2.98 (105%) 4.20 (189%)     07/04/20 3.43 (92.5%) 2.97 (104.7%) 4.26 (191.7%)     06/27/2020 3.13 84.5 2.70 95.4 3.51  157.8     06/20/20 3.39 (91.4%) 2.82 (99.2%) 3.34 (150.35)     06/13/20 3.10 (72.8%) 2.66 (82.5%) 3.34 (136.2%) 05/15/20      BOLT    03/11/20  2.75 (72%)  2.36 (81%)       02/12/20  2.85 (75%)  2.44 (86%)                IMAGING: Reviewed in EMR, pertinent findings noted in assessment and plan    ASSESSMENT and PLAN     Mr. Spencer Pena is a 71 y.o. man with IPF and emphysema s/p BOLT on 05/15/2020, also with type 2 diabetes mellitus, hypertension, colon cancer in 2007 s/p partial colon and hepatic wedge resection and FOLFOX here for follow up    Graft Function and Immunosuppression:  - PFTs stable and excellent, CXR clear, clinically feeling well  - HLA status: No DSAs, checked monthly, last 11/2020  - Total IgG <700 but insurance will not approve IVIG  - Immunosuppression:   - Tac goal 7-9  - AZA 50 mg daily; dropped from 100 mg 08/01/20  -Stopped MMF 06/13/20 (GI upset).    - Prednisone 7.5mg  daily  - BOS PPX  - Azithromycin 250mg  daily  - ASA 81mg  daily  - Pravastatin 40mg  daily   - Advair BID  - Montelukast 10mg  daily     Antimicrobial Prophylaxis:  - CMV, high risk (D+/R-): valganciclovir 450mg  daily for Estimated Creatinine Clearance: 47.4 mL/min (A) (based on SCr of 1.42 mg/dL (H)).   - EBV, (D+/R+): negative 06/20/20  - Fungal: Posaconazole stopped at 74mo  - PCP: Bactrim SS MWF     Skin Bruising - start biotin and CoQ10    Pain control - previously required gabapentin, now only on PRN acetaminophen.   Concern for OSA - STOP-BANG 4 points (male, age, observed apnea, snoring). Not using CPAP.   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  - pH probe with DeMeester 16.7%, slightly in abnormal range but will hold off on surgical intervention at this time in the setting of normal graft function.  - Daily PPI     BPH - terazosin 3mg  at bedtime   CKD IIIa - monitoring    Iron Deficiency Anemia - received IV iron 05/22/20, continues to be low  Hypomagnesemia - magnesium oxide 1200 mg BID     Type 2 diabetes mellitus   - Lantus will drop to 12 units in the nightly with pred adjustment  - aspart 7/7/3 with pred adjustment  - metformin 1000mg  BID  - sugars well controlled, apart from some lower morning sugars (16-109)    Bone Health: Last Dexa: Oct 2021, no osteopenia or osteoporosis  - on Ca and Vit D supplementation currently, last Vit D 27 on 05/24/20    Health Maintenance:  - Last derm visit:  Due  - Last Colonsocopy (if >40): Due in 2023 here at [facility]   - Last flu shot:   11//28/2022,   - Last Pneumovax:  06/10/2004, 12/30/2017  - Last Prevnar:  03/25/2016  - TdaP:   02/11/2018  - Shingrix:    03/10/20, 08/20/20  - COVID-19 (Moderna) 06/11/19, 07/09/19, 03/10/20, 07/18/2020, 12/03/20 received 1/2 dose Evushield,03/2021 (Bivalent)   -  Last Dexa:   02/2020, Due  - Last vitamin D:  51.2 (07/18/2020)  - Last HbA1c:   Due    Immunization History   Administered Date(s) Administered   ??? COVID-19 VACCINE,MRNA(MODERNA)(PF) 06/11/2019, 07/09/2019, 03/10/2020, 07/18/2020, 12/03/2020   ??? HEPB-CPG,ADULT DOSAGE ADJUVANTED, IM 03/12/2020   ??? INFLUENZA INJ MDCK PF, QUAD,(FLUCELVAX)(84MO AND UP EGG FREE) 02/08/2020   ??? INFLUENZA TIV (TRI) PF (IM) 02/27/2010   ??? Influenza Vaccine Quad (IIV4 PF) 70mo+ injectable 01/19/2019, 04/06/2021   ??? Influenza Virus Vaccine, unspecified formulation 02/07/2013, 02/28/2015, 02/05/2016, 02/02/2017, 01/27/2018   ??? PNEUMOCOCCAL POLYSACCHARIDE 23 06/10/2004, 12/30/2017   ??? Pneumococcal Conjugate 13-Valent 03/25/2016   ??? SHINGRIX-ZOSTER VACCINE (HZV), RECOMBINANT,SUB-UNIT,ADJUVANTED IM 03/10/2020, 08/20/2020   ??? TdaP 02/15/2012, 02/11/2018

## 2021-08-19 DIAGNOSIS — Z942 Lung transplant status: Principal | ICD-10-CM

## 2021-08-19 NOTE — Unmapped (Signed)
Called [name] to discuss lab results. Tacrolimus level was decreased at 5.9 with a goal of 7-9. Medication regimen is currently 1/0.5. Discussed with [name], CPP, dosing at this time to remain the same, advised to take on empty stomach. Repeat labs scheduled one week. Resulted labs reviewed. All questions answered. Pt verbalized understanding.

## 2021-08-25 NOTE — Unmapped (Signed)
ANNUAL LUNG TRANSPLANT WELLNESS VISIT    Confidential Psychological Cognitive Screening/Testing    Patient Name: Spencer Pena   Birthday: 03/28/2051  Yrs of Education: 7th grade and then a GED  Date:   08-18-21  Minutes Spent Face-to-Face:  30 minutes    Cognitive Screening:     As part of the lung transplant recipient annual wellness visit, patients undergo screening with the [facility] Cognitive Assessment (MoCA) to establish a baseline of cognitive functioning. The MoCA is a brief screening instrument that screens for mild cognitive dysfunction. It assess different cognitive domains: attention and concertation, executive functions, memory, language, visuoconstructional skills, conceptual thinking, calculations, and orientation. The total possible score is 30 points; a score of 25 or above is considered normal.     Mr. Spencer Pena obtained a score of 25/30 on the MoCA. A detailed description of his performance on the items of the MoCA are presented below.      Visuospatial/Executive   On the Alternate Trail Making task, Mr. Spencer Pena did not fully grasp the concept of what was being asked to do.  He earned 0/1 points.    On the Visuoconstructional Skills [employer]) task, he was very close but did not accurately copy the cube drawing to the extent where he could earn a point for doing so. He earned 0/1 points.    On the clock-drawing task, he earned a total of 3/3 points for the contour, the clock numbers, and the time.     Naming When asked to name 3 animals, he earned 3/3 points.     Attention.    Forward Digit Span:  When asked to repeat back a series of 5 numbers, he did so perfectly, and earned 1/1 points.    Backward Digit Span: When asked to repeat a series of 3 numbers in backwards order, he was able to do that, and earned 1/1 points.    Vigilance: When asked to tap whenever he heard the letter A spoken in a list of letters, he was successful and earned 1/1 points.     Serial 7s: When asked to start with the number 100 and count backwards by 7, he was able to say 93, 86 and 79, but then said, 71 and then 66. He earned 2 of 3 possible points.    Language  On a language task (sentence repetition), he earned 1 out of a possible 2 points.      Memory  On a test of delayed memory recall, he earned 4 of out a possible 5 points.  He was able to repeat 2 of 5 words immediately after hearing them the first time.  He was then able to repeat all 5 words when they were repeated a second time. After a 10-minute delay he was able to remember 4 of the 5 words and earned a score of 4/5.     Abstraction  He earned 2 out of 2 points on a measure of abstraction.  He was able to say how a train and a bicycle are similar. He was able to say how a watch and a ruler are similar.     Orientation  He was oriented to date, month, year, day, place and city.      Subtest  Maximum score  Patient's score    Visuospatial/executive functioning  5  3   Naming tasks  3  3   Attention  6  5   Language fluency  3  1   Abstraction  2  2   Delayed memory recall  5  4   Orientation  6  6   Additional point for 12 or less yrs of education  1  1   Total score  30  25        Summary:    Mr. 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 He was seen today as part of a comprehensive post-transplant wellness visit.     Mr. Spencer Pena???s score of a 25/30 could indicate mild cognitive impairment, according to typical scoring guidelines; however, his score is consistent with the mean score for his age group and education level Wallace Cullens et al., 2011). Rossetti et al. (2011) found the mean MOCA score for adults aged 97-75 with less than a 12th grade education was 18.37.  Current research demonstrates that the current MoCA cutoff (26/30) is too high for cognitively normal older adults. Therefore, it is important to use the age- and education-related norms to avoid misdiagnosis of cognitive decline. ??? Nonetheless, I recommend that rather than giving Mr. Spencer Pena large amounts of complex medical information and instructions, give him smaller components of information. He would benefit from receiving chunks of information that are easily understood. Additionally, involving his wife or other family members in medical-related conversations may be appropriate to ensure someone else in his family is learning the information as well.     Linton Ham, N., [place], N., Soffer, S., & Ash, E. L. (2020). Is the Cutoff of the MoCA too High? Longitudinal Data From Highly Educated Older Adults. Journal of geriatric psychiatry and neurology, 33(3), 155-160. LiveGrowth.co.nz    Wallace Cullens, Denny Levy., Lessie Dings., & Ellamae Sia. F. (2011). Normative data for the [facility] Cognitive Assessment (MoCA) in a population-based sample. Neurology, 77(13), [phone]. https://[email or web address]-williams.com/ a

## 2021-08-26 MED ORDER — TERAZOSIN 1 MG CAPSULE
ORAL_CAPSULE | Freq: Every evening | ORAL | 11 refills | 30 days | Status: CP
Start: 2021-08-26 — End: ?
  Filled 2021-09-01: qty 90, 30d supply, fill #0

## 2021-08-26 NOTE — Unmapped (Signed)
Pt request for RX Refill   terazosin (HYTRIN) 1 MG capsule

## 2021-08-26 NOTE — Unmapped (Signed)
[facility] Refill Coordination Note    Specialty Medication(s) to be Shipped:   Transplant: valgancyclovir 450mg  and Prednisone 5mg     Other medication(s) to be shipped:  Test  strips , Terazosin , Pravastatin , Magnesium  , Calcium carbonate  , Azithromycin, Pantoprazole, Vitamin D      Spencer ANDRINGA Sr., DOB: 1950/06/11  Phone: [phone] (home) [phone] (work)      All above HIPAA information was verified with patient.     Was a Nurse, learning disability used for this call? No    Completed refill call assessment today to schedule patient's medication shipment from the [facility] [phone]).  All relevant notes have been reviewed.     Specialty medication(s) and dose(s) confirmed: Regimen is correct and unchanged.   Changes to medications: Graham reports no changes at this time.  Changes to insurance: No  New side effects reported not previously addressed with a pharmacist or physician: None reported  Questions for the pharmacist: No    Confirmed patient received a Conservation officer, historic buildings and a Surveyor, mining with first shipment. The patient will receive a drug information handout for each medication shipped and additional FDA Medication Guides as required.       DISEASE/MEDICATION-SPECIFIC INFORMATION        N/A    SPECIALTY MEDICATION ADHERENCE     Medication Adherence    Patient reported X missed doses in the last month: 0  Specialty Medication: Valgancyclovir 450mg   Patient is on additional specialty medications: Yes  Additional Specialty Medications: Prednisone 5mg   Patient Reported Additional Medication X Missed Doses in the Last Month: 0  Patient is on more than two specialty medications: No  Informant: patient              Were doses missed due to medication being on hold? No     valGANciclovir 450 mg -10 days of medicine on hand        REFERRAL TO PHARMACIST     Referral to the pharmacist: Not needed      [facility]     Shipping address confirmed in Epic.     Delivery Scheduled: Yes, Expected medication delivery date: 09/02/21.  However, Rx request for refills was sent to the provider as there are none remaining.     Medication will be delivered via Next Day Courier to the prescription address in Epic [place].    Wyatt Mage M Elisabeth Cara   [facility] Specialty Technician

## 2021-08-27 LAB — HLA DS POST TRANSPLANT
ANTI-DONOR DRW #1 MFI: 0 MFI
ANTI-DONOR DRW #2 MFI: 0 MFI
ANTI-DONOR HLA-A #1 MFI: 0 MFI
ANTI-DONOR HLA-A #2 MFI: 0 MFI
ANTI-DONOR HLA-B #1 MFI: 0 MFI
ANTI-DONOR HLA-B #2 MFI: 0 MFI
ANTI-DONOR HLA-C #2 MFI: 0 MFI
ANTI-DONOR HLA-DQB #1 MFI: 0 MFI
ANTI-DONOR HLA-DQB #2 MFI: 0 MFI
ANTI-DONOR HLA-DR #1 MFI: 0 MFI
ANTI-DONOR HLA-DR #2 MFI: 0 MFI

## 2021-08-27 LAB — FSAB CLASS 2 ANTIBODY SPECIFICITY: HLA CL2 AB RESULT: NEGATIVE

## 2021-08-27 LAB — FSAB CLASS 1 ANTIBODY SPECIFICITY: HLA CLASS 1 ANTIBODY RESULT: NEGATIVE

## 2021-09-01 ENCOUNTER — Ambulatory Visit: Admit: 2021-09-01 | Discharge: 2021-09-02 | Payer: MEDICARE

## 2021-09-01 DIAGNOSIS — Z942 Lung transplant status: Principal | ICD-10-CM

## 2021-09-01 DIAGNOSIS — D509 Iron deficiency anemia, unspecified: Principal | ICD-10-CM

## 2021-09-01 LAB — COMPREHENSIVE METABOLIC PANEL
ALBUMIN: 3.7 g/dL (ref 3.4–5.0)
ALKALINE PHOSPHATASE: 84 U/L (ref 46–116)
ALT (SGPT): 27 U/L (ref 10–49)
ANION GAP: 3 mmol/L — ABNORMAL LOW (ref 5–14)
AST (SGOT): 36 U/L — ABNORMAL HIGH (ref <=34)
BILIRUBIN TOTAL: 0.3 mg/dL (ref 0.3–1.2)
BLOOD UREA NITROGEN: 30 mg/dL — ABNORMAL HIGH (ref 9–23)
BUN / CREAT RATIO: 21
CALCIUM: 9.3 mg/dL (ref 8.7–10.4)
CHLORIDE: 110 mmol/L — ABNORMAL HIGH (ref 98–107)
CO2: 29.8 mmol/L (ref 20.0–31.0)
CREATININE: 1.44 mg/dL — ABNORMAL HIGH
EGFR CKD-EPI (2021) MALE: 52 mL/min/{1.73_m2} — ABNORMAL LOW (ref >=60)
GLUCOSE RANDOM: 95 mg/dL (ref 70–179)
POTASSIUM: 4.4 mmol/L (ref 3.4–4.8)
PROTEIN TOTAL: 6.3 g/dL (ref 5.7–8.2)
SODIUM: 143 mmol/L (ref 135–145)

## 2021-09-01 LAB — CBC W/ AUTO DIFF
BASOPHILS ABSOLUTE COUNT: 0 10*9/L (ref 0.0–0.1)
BASOPHILS RELATIVE PERCENT: 0.7 %
EOSINOPHILS ABSOLUTE COUNT: 0.1 10*9/L (ref 0.0–0.5)
EOSINOPHILS RELATIVE PERCENT: 1.3 %
HEMATOCRIT: 34.2 % — ABNORMAL LOW (ref 39.0–48.0)
HEMOGLOBIN: 11.4 g/dL — ABNORMAL LOW (ref 12.9–16.5)
LYMPHOCYTES ABSOLUTE COUNT: 0.7 10*9/L — ABNORMAL LOW (ref 1.1–3.6)
LYMPHOCYTES RELATIVE PERCENT: 17.7 %
MEAN CORPUSCULAR HEMOGLOBIN CONC: 33.3 g/dL (ref 32.0–36.0)
MEAN CORPUSCULAR HEMOGLOBIN: 29.8 pg (ref 25.9–32.4)
MEAN CORPUSCULAR VOLUME: 89.5 fL (ref 77.6–95.7)
MEAN PLATELET VOLUME: 7.4 fL (ref 6.8–10.7)
MONOCYTES ABSOLUTE COUNT: 0.3 10*9/L (ref 0.3–0.8)
MONOCYTES RELATIVE PERCENT: 8.2 %
NEUTROPHILS ABSOLUTE COUNT: 2.9 10*9/L (ref 1.8–7.8)
NEUTROPHILS RELATIVE PERCENT: 72.1 %
NUCLEATED RED BLOOD CELLS: 0 /100{WBCs} (ref <=4)
PLATELET COUNT: 156 10*9/L (ref 150–450)
RED BLOOD CELL COUNT: 3.82 10*12/L — ABNORMAL LOW (ref 4.26–5.60)
RED CELL DISTRIBUTION WIDTH: 14.2 % (ref 12.2–15.2)
WBC ADJUSTED: 4 10*9/L (ref 3.6–11.2)

## 2021-09-01 LAB — CMV DNA, QUANTITATIVE, PCR: CMV VIRAL LD: NOT DETECTED

## 2021-09-01 LAB — HEMOGLOBIN A1C
ESTIMATED AVERAGE GLUCOSE: 108 mg/dL
HEMOGLOBIN A1C: 5.4 % (ref 4.8–5.6)

## 2021-09-01 LAB — IRON PANEL
IRON SATURATION (CALC): 11 %
IRON: 38 ug/dL — ABNORMAL LOW
TOTAL IRON BINDING CAPACITY (CALC): 349 ug/dL (ref 250.0–425.0)
TRANSFERRIN: 277 mg/dL

## 2021-09-01 LAB — TACROLIMUS LEVEL, TROUGH: TACROLIMUS, TROUGH: 6.8 ng/mL (ref 5.0–15.0)

## 2021-09-01 LAB — TSH: THYROID STIMULATING HORMONE: 1.783 u[IU]/mL (ref 0.550–4.780)

## 2021-09-01 LAB — IGG: GAMMAGLOBULIN; IGG: 609 mg/dL — ABNORMAL LOW (ref 646–2013)

## 2021-09-01 LAB — MAGNESIUM: MAGNESIUM: 1.6 mg/dL (ref 1.6–2.6)

## 2021-09-01 LAB — FERRITIN: FERRITIN: 12.5 ng/mL

## 2021-09-01 LAB — PHOSPHORUS: PHOSPHORUS: 3 mg/dL (ref 2.4–5.1)

## 2021-09-01 LAB — T4, FREE: FREE T4: 1.1 ng/dL (ref 0.89–1.76)

## 2021-09-01 MED FILL — PANTOPRAZOLE 40 MG TABLET,DELAYED RELEASE: ORAL | 90 days supply | Qty: 90 | Fill #2

## 2021-09-01 MED FILL — MAGNESIUM OXIDE 400 MG (241.3 MG MAGNESIUM) TABLET: ORAL | 30 days supply | Qty: 180 | Fill #11

## 2021-09-01 MED FILL — CALCIUM CARBONATE 600 MG CALCIUM (1,500 MG) TABLET: ORAL | 30 days supply | Qty: 60 | Fill #4

## 2021-09-01 MED FILL — ON CALL EXPRESS TEST STRIP: 20 days supply | Qty: 100 | Fill #2

## 2021-09-01 MED FILL — PRAVASTATIN 40 MG TABLET: ORAL | 30 days supply | Qty: 30 | Fill #11

## 2021-09-01 MED FILL — AZITHROMYCIN 250 MG TABLET: ORAL | 30 days supply | Qty: 30 | Fill #4

## 2021-09-01 MED FILL — VALGANCICLOVIR 450 MG TABLET: ORAL | 30 days supply | Qty: 30 | Fill #2

## 2021-09-01 MED FILL — CHOLECALCIFEROL (VITAMIN D3) 25 MCG (1,000 UNIT) TABLET: ORAL | 50 days supply | Qty: 100 | Fill #1

## 2021-09-01 NOTE — Unmapped (Signed)
Called patient and let him know that in setting of anemia and iron deficiency (ferritin of 12) will give venofer 400mg  x3 per [facility] CPP.  Therapy plan entered and scheduled requested.  Patient verbalized understanding.

## 2021-09-03 LAB — EBV QUANTITATIVE PCR, BLOOD: EBV VIRAL LOAD RESULT: NOT DETECTED

## 2021-09-03 MED ORDER — SITAGLIPTIN PHOSPHATE 50 MG TABLET
ORAL_TABLET | Freq: Every day | ORAL | 3 refills | 90 days | Status: CP
Start: 2021-09-03 — End: 2022-09-03
  Filled 2021-09-14: qty 90, 90d supply, fill #0

## 2021-09-07 ENCOUNTER — Ambulatory Visit: Admit: 2021-09-07 | Discharge: 2021-09-08 | Payer: MEDICARE

## 2021-09-07 DIAGNOSIS — E119 Type 2 diabetes mellitus without complications: Principal | ICD-10-CM

## 2021-09-07 NOTE — Unmapped (Signed)
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 no complaints.     Assessment and plan:     # DM without DR  Hbg A1c: 5.4 (09/01/2021), 5.1 (12/03/2020)    fu today, doing well.     The patient was advised to maintain tight glucose control, tight blood pressure control, and favorable levels of cholesterol.    fu one year, OCT, gen oph     #  h/o hemorrhagic PVD-   no RD/RD . No new floaters or photopsia   precautions reviewed    # pseudophakia OU.  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  I reviewed the resident???s note.  I agree with the resident???s findings and plan including interpretation of images.

## 2021-09-10 MED FILL — MONTELUKAST 10 MG TABLET: ORAL | 30 days supply | Qty: 30 | Fill #1

## 2021-09-13 DIAGNOSIS — D509 Iron deficiency anemia, unspecified: Principal | ICD-10-CM

## 2021-09-13 DIAGNOSIS — Z942 Lung transplant status: Principal | ICD-10-CM

## 2021-09-15 ENCOUNTER — Ambulatory Visit: Admit: 2021-09-15 | Discharge: 2021-09-16 | Payer: MEDICARE

## 2021-09-15 DIAGNOSIS — D509 Iron deficiency anemia, unspecified: Principal | ICD-10-CM

## 2021-09-15 DIAGNOSIS — Z942 Lung transplant status: Principal | ICD-10-CM

## 2021-09-15 MED ADMIN — diphenhydrAMINE (BENADRYL) capsule/tablet 25 mg: 25 mg | ORAL | @ 14:00:00 | Stop: 2021-09-15

## 2021-09-15 MED ADMIN — iron sucrose (VENOFER) 400 mg in sodium chloride (NS) 0.9 % 250 mL IVPB: 400 mg | INTRAVENOUS | @ 14:00:00 | Stop: 2021-09-15

## 2021-09-15 MED ADMIN — acetaminophen (TYLENOL) tablet 650 mg: 650 mg | ORAL | @ 14:00:00 | Stop: 2021-09-15

## 2021-09-15 NOTE — Unmapped (Signed)
1610 Patient arrived to the Transplant Infusion room scheduled to receive Venofer, Condtion: well; Mobility: ambulating; accompanied by spouse.   See Flowsheet and MAR for all details of visit.    0940 VS stable.  9604 Premedications given.  5409 PIV placed and secured with coban, labs collected and sent and no orders found, urine no orders found.  1021Venofer infusion initiated. Patient educated to notify nursing staff if they experience urticaria, erythema, shortness of breath, nausea, metal taste in their mouth, excessive oral or postnasal drainage and any other changes in status which could be side effects from initiating this medication.  1246 Venofer infusion complete.   1316 Post infusion observation period complete, patient tolerated feraheme well and without symptoms of reaction.  1345 VS stable, patient feeling well, PIV removed and secured with coban. Patient left clinic accompanied by spouse.

## 2021-09-24 ENCOUNTER — Ambulatory Visit: Admit: 2021-09-24 | Discharge: 2021-09-25 | Payer: MEDICARE

## 2021-09-24 ENCOUNTER — Encounter: Admit: 2021-09-24 | Discharge: 2021-09-25 | Payer: MEDICARE | Attending: Internal Medicine | Primary: Internal Medicine

## 2021-09-24 DIAGNOSIS — Z942 Lung transplant status: Principal | ICD-10-CM

## 2021-09-24 DIAGNOSIS — D509 Iron deficiency anemia, unspecified: Principal | ICD-10-CM

## 2021-09-24 LAB — COMPREHENSIVE METABOLIC PANEL
ALBUMIN: 4.1 g/dL (ref 3.4–5.0)
ALKALINE PHOSPHATASE: 81 U/L (ref 46–116)
ALT (SGPT): 22 U/L (ref 10–49)
ANION GAP: 6 mmol/L (ref 5–14)
AST (SGOT): 31 U/L (ref <=34)
BILIRUBIN TOTAL: 0.4 mg/dL (ref 0.3–1.2)
BLOOD UREA NITROGEN: 38 mg/dL — ABNORMAL HIGH (ref 9–23)
BUN / CREAT RATIO: 27
CALCIUM: 9.2 mg/dL (ref 8.7–10.4)
CHLORIDE: 104 mmol/L (ref 98–107)
CO2: 28 mmol/L (ref 20.0–31.0)
CREATININE: 1.4 mg/dL — ABNORMAL HIGH
EGFR CKD-EPI (2021) MALE: 54 mL/min/{1.73_m2} — ABNORMAL LOW (ref >=60)
GLUCOSE RANDOM: 87 mg/dL (ref 70–179)
POTASSIUM: 4 mmol/L (ref 3.4–4.8)
PROTEIN TOTAL: 6.6 g/dL (ref 5.7–8.2)
SODIUM: 138 mmol/L (ref 135–145)

## 2021-09-24 LAB — CBC W/ AUTO DIFF
BASOPHILS ABSOLUTE COUNT: 0 10*9/L (ref 0.0–0.1)
BASOPHILS RELATIVE PERCENT: 1 %
EOSINOPHILS ABSOLUTE COUNT: 0 10*9/L (ref 0.0–0.5)
EOSINOPHILS RELATIVE PERCENT: 1.4 %
HEMATOCRIT: 35.5 % — ABNORMAL LOW (ref 39.0–48.0)
HEMOGLOBIN: 11.8 g/dL — ABNORMAL LOW (ref 12.9–16.5)
LYMPHOCYTES ABSOLUTE COUNT: 0.5 10*9/L — ABNORMAL LOW (ref 1.1–3.6)
LYMPHOCYTES RELATIVE PERCENT: 15.5 %
MEAN CORPUSCULAR HEMOGLOBIN CONC: 33.3 g/dL (ref 32.0–36.0)
MEAN CORPUSCULAR HEMOGLOBIN: 29 pg (ref 25.9–32.4)
MEAN CORPUSCULAR VOLUME: 87.1 fL (ref 77.6–95.7)
MEAN PLATELET VOLUME: 8 fL (ref 6.8–10.7)
MONOCYTES ABSOLUTE COUNT: 0.3 10*9/L (ref 0.3–0.8)
MONOCYTES RELATIVE PERCENT: 7.9 %
NEUTROPHILS ABSOLUTE COUNT: 2.4 10*9/L (ref 1.8–7.8)
NEUTROPHILS RELATIVE PERCENT: 74.2 %
PLATELET COUNT: 140 10*9/L — ABNORMAL LOW (ref 150–450)
RED BLOOD CELL COUNT: 4.08 10*12/L — ABNORMAL LOW (ref 4.26–5.60)
RED CELL DISTRIBUTION WIDTH: 15.4 % — ABNORMAL HIGH (ref 12.2–15.2)
WBC ADJUSTED: 3.3 10*9/L — ABNORMAL LOW (ref 3.6–11.2)

## 2021-09-24 LAB — TACROLIMUS LEVEL, TROUGH: TACROLIMUS, TROUGH: 5.4 ng/mL (ref 5.0–15.0)

## 2021-09-24 LAB — MAGNESIUM: MAGNESIUM: 1.7 mg/dL (ref 1.6–2.6)

## 2021-09-24 LAB — IGG: GAMMAGLOBULIN; IGG: 591 mg/dL — ABNORMAL LOW (ref 646–2013)

## 2021-09-24 LAB — PHOSPHORUS: PHOSPHORUS: 2.9 mg/dL (ref 2.4–5.1)

## 2021-09-24 MED ADMIN — diphenhydrAMINE (BENADRYL) capsule/tablet 25 mg: 25 mg | ORAL | @ 13:00:00 | Stop: 2021-09-24

## 2021-09-24 MED ADMIN — acetaminophen (TYLENOL) tablet 650 mg: 650 mg | ORAL | @ 13:00:00 | Stop: 2021-09-24

## 2021-09-24 MED ADMIN — iron sucrose (VENOFER) 400 mg in sodium chloride (NS) 0.9 % 250 mL IVPB: 400 mg | INTRAVENOUS | @ 14:00:00 | Stop: 2021-09-24

## 2021-09-24 NOTE — Unmapped (Addendum)
0901 Patient arrived to the Transplant Infusion room scheduled to receive Venofer, Condtion: well; Mobility: ambulating; accompanied by self.   See Flowsheet and MAR for all details of visit.    0906 VS stable.  1610 Premedications given.  0915 PIV placed and secured with coban, labs collected and sent, urine no orders found.  9604 Venofer infusion initiated. Patient educated to notify nursing staff if they experience urticaria, erythema, shortness of breath, nausea, metal taste in their mouth, excessive oral or postnasal drainage and any other changes in status which could be side effects from initiating this medication.  1209 Venofer infusion complete.   1249 Post infusion observation period complete, patient tolerated feraheme well and without symptoms of reaction.  1256 VS stable, patient feeling well, PIV removed and secured with coban. Patient left clinic accompanied by self.

## 2021-09-25 DIAGNOSIS — Z942 Lung transplant status: Principal | ICD-10-CM

## 2021-09-25 LAB — CMV DNA, QUANTITATIVE, PCR: CMV VIRAL LD: NOT DETECTED

## 2021-09-25 MED ORDER — TACROLIMUS 0.5 MG CAPSULE, IMMEDIATE-RELEASE
ORAL_CAPSULE | Freq: Two times a day (BID) | ORAL | 3 refills | 90 days | Status: CP
Start: 2021-09-25 — End: 2022-09-25

## 2021-09-25 MED ORDER — SITAGLIPTIN PHOSPHATE 100 MG TABLET
ORAL_TABLET | Freq: Every day | ORAL | 3 refills | 90 days | Status: CP
Start: 2021-09-25 — End: 2022-09-25
  Filled 2021-10-19: qty 90, 90d supply, fill #0

## 2021-09-25 NOTE — Unmapped (Signed)
Outgoing call to [employer] Sr.Marland Kitchen  He had asked about stopping Lantus.  D/W [name] and advised to increase Januvia to 100 mg daily and monitor bsg for 2-3 weeks and depending on numbers can trial off Lantus.  Questions answered, verbalized understanding.

## 2021-09-25 NOTE — Unmapped (Signed)
Called Spencer Mallet Sr. to discuss lab results. Tacrolimus level was decreased at 5.4 with a goal of 7-9. Medication regimen is currently 1/0.5. Discussed with [name], CPP, dosing at this time to remain the same and increased to 1 mg BID. Repeat labs scheduled two weeks. Resulted labs reviewed. All questions answered. Pt verbalized understanding.

## 2021-09-28 LAB — EBV QUANTITATIVE PCR, BLOOD
EBV QUANT: <100 [IU]/mL — ABNORMAL HIGH (ref <0)
EBV VIRAL LOAD RESULT: DETECTED — AB

## 2021-09-30 DIAGNOSIS — Z942 Lung transplant status: Principal | ICD-10-CM

## 2021-09-30 MED ORDER — MAGNESIUM OXIDE 400 MG (241.3 MG MAGNESIUM) TABLET
ORAL_TABLET | Freq: Two times a day (BID) | ORAL | 11 refills | 30 days | Status: CP
Start: 2021-09-30 — End: 2022-09-30
  Filled 2021-10-05: qty 180, 30d supply, fill #0

## 2021-09-30 MED ORDER — METFORMIN 1,000 MG TABLET
ORAL_TABLET | Freq: Two times a day (BID) | ORAL | 3 refills | 90 days | Status: CP
Start: 2021-09-30 — End: 2021-12-29
  Filled 2021-10-05: qty 180, 90d supply, fill #0

## 2021-09-30 NOTE — Unmapped (Signed)
Outgoing call to [employer] Sr. EBV positive <100, will repeat with lab redraw next week. Questions answered, verbalized understanding.

## 2021-09-30 NOTE — Unmapped (Signed)
Pt request for RX Refill    magnesium oxide (MAG-OX) 400 mg (241.3 mg elemental magnesium) tablet

## 2021-09-30 NOTE — Unmapped (Signed)
[facility] Refill Coordination Note    Specialty Medication(s) to be Shipped:   Transplant: valgancyclovir 450mg     Other medication(s) to be shipped: terazosin, magnesium, montelukast, metformin, calcium, test strips     Spencer Mallet Sr., DOB: 1950-07-14  Phone: [phone] (home) [phone] (work)      All above HIPAA information was verified with patient.     Was a Nurse, learning disability used for this call? No    Completed refill call assessment today to schedule patient's medication shipment from the [facility] [phone]).  All relevant notes have been reviewed.     Specialty medication(s) and dose(s) confirmed: Regimen is correct and unchanged.   Changes to medications: [name] reports no changes at this time.  Changes to insurance: No  New side effects reported not previously addressed with a pharmacist or physician: None reported  Questions for the pharmacist: No    Confirmed patient received a Conservation officer, historic buildings and a Surveyor, mining with first shipment. The patient will receive a drug information handout for each medication shipped and additional FDA Medication Guides as required.       DISEASE/MEDICATION-SPECIFIC INFORMATION        N/A    SPECIALTY MEDICATION ADHERENCE     Medication Adherence    Patient reported X missed doses in the last month: 0  Specialty Medication: valGANciclovir 450 mg tablet (VALCYTE)  Patient is on additional specialty medications: No  Patient is on more than two specialty medications: No  Any gaps in refill history greater than 2 weeks in the last 3 months: no  Demonstrates understanding of importance of adherence: yes  Informant: patient              Were doses missed due to medication being on hold? No    Valganciclovir 450mg : Patient has 10 days of medication on hand    REFERRAL TO PHARMACIST     Referral to the pharmacist: Not needed      [facility]     Shipping address confirmed in Epic.     Delivery Scheduled: Yes, Expected medication delivery date: 5/31.     Medication will be delivered via Next Day Courier to the prescription address in Epic WAM.    Olga Millers   [facility] Specialty Technician

## 2021-10-02 LAB — FSAB CLASS 2 ANTIBODY SPECIFICITY: HLA CL2 AB RESULT: NEGATIVE

## 2021-10-02 LAB — HLA DS POST TRANSPLANT
ANTI-DONOR DRW #1 MFI: 0 MFI
ANTI-DONOR DRW #2 MFI: 0 MFI
ANTI-DONOR HLA-A #1 MFI: 11 MFI
ANTI-DONOR HLA-A #2 MFI: 22 MFI
ANTI-DONOR HLA-B #1 MFI: 10 MFI
ANTI-DONOR HLA-B #2 MFI: 0 MFI
ANTI-DONOR HLA-C #2 MFI: 0 MFI
ANTI-DONOR HLA-DQB #1 MFI: 0 MFI
ANTI-DONOR HLA-DQB #2 MFI: 0 MFI
ANTI-DONOR HLA-DR #1 MFI: 0 MFI
ANTI-DONOR HLA-DR #2 MFI: 0 MFI

## 2021-10-02 LAB — FSAB CLASS 1 ANTIBODY SPECIFICITY: HLA CLASS 1 ANTIBODY RESULT: NEGATIVE

## 2021-10-05 MED FILL — MONTELUKAST 10 MG TABLET: ORAL | 30 days supply | Qty: 30 | Fill #2

## 2021-10-05 MED FILL — VALGANCICLOVIR 450 MG TABLET: ORAL | 30 days supply | Qty: 30 | Fill #3

## 2021-10-05 MED FILL — TERAZOSIN 1 MG CAPSULE: ORAL | 30 days supply | Qty: 90 | Fill #1

## 2021-10-05 MED FILL — ON CALL EXPRESS TEST STRIP: 20 days supply | Qty: 100 | Fill #3

## 2021-10-05 MED FILL — CALCIUM CARBONATE 600 MG CALCIUM (1,500 MG) TABLET: ORAL | 30 days supply | Qty: 60 | Fill #5

## 2021-10-06 ENCOUNTER — Ambulatory Visit: Admit: 2021-10-06 | Discharge: 2021-10-07 | Payer: MEDICARE

## 2021-10-06 LAB — COMPREHENSIVE METABOLIC PANEL
ALBUMIN: 3.8 g/dL (ref 3.4–5.0)
ALKALINE PHOSPHATASE: 71 U/L (ref 46–116)
ALT (SGPT): 17 U/L (ref 10–49)
ANION GAP: 6 mmol/L (ref 5–14)
AST (SGOT): 23 U/L (ref <=34)
BILIRUBIN TOTAL: 0.3 mg/dL (ref 0.3–1.2)
BLOOD UREA NITROGEN: 37 mg/dL — ABNORMAL HIGH (ref 9–23)
BUN / CREAT RATIO: 23
CALCIUM: 9.8 mg/dL (ref 8.7–10.4)
CHLORIDE: 107 mmol/L (ref 98–107)
CO2: 29.4 mmol/L (ref 20.0–31.0)
CREATININE: 1.61 mg/dL — ABNORMAL HIGH
EGFR CKD-EPI (2021) MALE: 45 mL/min/{1.73_m2} — ABNORMAL LOW (ref >=60)
GLUCOSE RANDOM: 91 mg/dL (ref 70–179)
POTASSIUM: 4.3 mmol/L (ref 3.4–4.8)
PROTEIN TOTAL: 6 g/dL (ref 5.7–8.2)
SODIUM: 142 mmol/L (ref 135–145)

## 2021-10-06 LAB — CBC W/ AUTO DIFF
BASOPHILS ABSOLUTE COUNT: 0 10*9/L (ref 0.0–0.1)
BASOPHILS RELATIVE PERCENT: 0.9 %
EOSINOPHILS ABSOLUTE COUNT: 0 10*9/L (ref 0.0–0.5)
EOSINOPHILS RELATIVE PERCENT: 1.2 %
HEMATOCRIT: 35.9 % — ABNORMAL LOW (ref 39.0–48.0)
HEMOGLOBIN: 12 g/dL — ABNORMAL LOW (ref 12.9–16.5)
LYMPHOCYTES ABSOLUTE COUNT: 0.7 10*9/L — ABNORMAL LOW (ref 1.1–3.6)
LYMPHOCYTES RELATIVE PERCENT: 18.1 %
MEAN CORPUSCULAR HEMOGLOBIN CONC: 33.5 g/dL (ref 32.0–36.0)
MEAN CORPUSCULAR HEMOGLOBIN: 29.9 pg (ref 25.9–32.4)
MEAN CORPUSCULAR VOLUME: 89.1 fL (ref 77.6–95.7)
MEAN PLATELET VOLUME: 7.7 fL (ref 6.8–10.7)
MONOCYTES ABSOLUTE COUNT: 0.3 10*9/L (ref 0.3–0.8)
MONOCYTES RELATIVE PERCENT: 7 %
NEUTROPHILS ABSOLUTE COUNT: 2.7 10*9/L (ref 1.8–7.8)
NEUTROPHILS RELATIVE PERCENT: 72.8 %
NUCLEATED RED BLOOD CELLS: 0 /100{WBCs} (ref <=4)
PLATELET COUNT: 128 10*9/L — ABNORMAL LOW (ref 150–450)
RED BLOOD CELL COUNT: 4.03 10*12/L — ABNORMAL LOW (ref 4.26–5.60)
RED CELL DISTRIBUTION WIDTH: 17 % — ABNORMAL HIGH (ref 12.2–15.2)
WBC ADJUSTED: 3.7 10*9/L (ref 3.6–11.2)

## 2021-10-06 LAB — TACROLIMUS LEVEL: TACROLIMUS BLOOD: 8.5 ng/mL

## 2021-10-06 NOTE — Unmapped (Signed)
Called [name] to discuss lab results. Tacrolimus level was normal at 8.5 with a goal of 7-9. Medication regimen is currently 1mg  BID. Discussed with [name], CPP, dosing at this time to remain the same 1mg  BID. Cr up to 1.61 -- encouraged good hydration. EBV pending.  Repeat labs scheduled 1 month. Resulted labs reviewed. All questions answered. Pt verbalized understanding.

## 2021-10-08 LAB — EBV QUANTITATIVE PCR, BLOOD: EBV VIRAL LOAD RESULT: NOT DETECTED

## 2021-10-11 DIAGNOSIS — Z942 Lung transplant status: Principal | ICD-10-CM

## 2021-10-11 DIAGNOSIS — D509 Iron deficiency anemia, unspecified: Principal | ICD-10-CM

## 2021-10-14 MED ORDER — PRAVASTATIN 40 MG TABLET
ORAL_TABLET | Freq: Every day | ORAL | 11 refills | 30 days | Status: CP
Start: 2021-10-14 — End: ?
  Filled 2021-10-19: qty 30, 30d supply, fill #0

## 2021-10-15 NOTE — Unmapped (Signed)
Received request to speak with patient about insulin requirements and need to continue to take long acting insulin as he's almost out and didn't want to refill if he didn't need to.     His AM BG are 90-109 routinely.    We will stop long acting insulin (12 units daily) and follow up on BG in 1 week via phone.    All questions answered.    Spencer Pena

## 2021-10-19 MED FILL — SULFAMETHOXAZOLE 400 MG-TRIMETHOPRIM 80 MG TABLET: ORAL | 84 days supply | Qty: 36 | Fill #3

## 2021-10-19 MED FILL — AZITHROMYCIN 250 MG TABLET: ORAL | 30 days supply | Qty: 30 | Fill #5

## 2021-10-22 MED FILL — CHOLECALCIFEROL (VITAMIN D3) 25 MCG (1,000 UNIT) TABLET: ORAL | 50 days supply | Qty: 100 | Fill #2

## 2021-10-26 ENCOUNTER — Ambulatory Visit: Admit: 2021-10-26 | Discharge: 2021-10-27 | Payer: MEDICARE

## 2021-10-26 LAB — CBC W/ AUTO DIFF
BASOPHILS ABSOLUTE COUNT: 0 10*9/L (ref 0.0–0.1)
BASOPHILS RELATIVE PERCENT: 0.7 %
EOSINOPHILS ABSOLUTE COUNT: 0.1 10*9/L (ref 0.0–0.5)
EOSINOPHILS RELATIVE PERCENT: 1.9 %
HEMATOCRIT: 36.7 % — ABNORMAL LOW (ref 39.0–48.0)
HEMOGLOBIN: 12.1 g/dL — ABNORMAL LOW (ref 12.9–16.5)
LYMPHOCYTES ABSOLUTE COUNT: 0.7 10*9/L — ABNORMAL LOW (ref 1.1–3.6)
LYMPHOCYTES RELATIVE PERCENT: 16.3 %
MEAN CORPUSCULAR HEMOGLOBIN CONC: 33 g/dL (ref 32.0–36.0)
MEAN CORPUSCULAR HEMOGLOBIN: 29.6 pg (ref 25.9–32.4)
MEAN CORPUSCULAR VOLUME: 89.9 fL (ref 77.6–95.7)
MEAN PLATELET VOLUME: 7.7 fL (ref 6.8–10.7)
MONOCYTES ABSOLUTE COUNT: 0.3 10*9/L (ref 0.3–0.8)
MONOCYTES RELATIVE PERCENT: 7.5 %
NEUTROPHILS ABSOLUTE COUNT: 3.1 10*9/L (ref 1.8–7.8)
NEUTROPHILS RELATIVE PERCENT: 73.6 %
NUCLEATED RED BLOOD CELLS: 0 /100{WBCs} (ref <=4)
PLATELET COUNT: 138 10*9/L — ABNORMAL LOW (ref 150–450)
RED BLOOD CELL COUNT: 4.08 10*12/L — ABNORMAL LOW (ref 4.26–5.60)
RED CELL DISTRIBUTION WIDTH: 17.6 % — ABNORMAL HIGH (ref 12.2–15.2)
WBC ADJUSTED: 4.3 10*9/L (ref 3.6–11.2)

## 2021-10-26 LAB — COMPREHENSIVE METABOLIC PANEL
ALBUMIN: 3.9 g/dL (ref 3.4–5.0)
ALKALINE PHOSPHATASE: 74 U/L (ref 46–116)
ALT (SGPT): 19 U/L (ref 10–49)
ANION GAP: 5 mmol/L (ref 5–14)
AST (SGOT): 27 U/L (ref <=34)
BILIRUBIN TOTAL: 0.4 mg/dL (ref 0.3–1.2)
BLOOD UREA NITROGEN: 32 mg/dL — ABNORMAL HIGH (ref 9–23)
BUN / CREAT RATIO: 23
CALCIUM: 9.9 mg/dL (ref 8.7–10.4)
CHLORIDE: 106 mmol/L (ref 98–107)
CO2: 30.8 mmol/L (ref 20.0–31.0)
CREATININE: 1.39 mg/dL — ABNORMAL HIGH
EGFR CKD-EPI (2021) MALE: 54 mL/min/{1.73_m2} — ABNORMAL LOW (ref >=60)
GLUCOSE RANDOM: 106 mg/dL (ref 70–179)
POTASSIUM: 4.4 mmol/L (ref 3.4–4.8)
PROTEIN TOTAL: 6.3 g/dL (ref 5.7–8.2)
SODIUM: 142 mmol/L (ref 135–145)

## 2021-10-26 LAB — PHOSPHORUS: PHOSPHORUS: 3.1 mg/dL (ref 2.4–5.1)

## 2021-10-26 LAB — IGG: GAMMAGLOBULIN; IGG: 671 mg/dL (ref 646–2013)

## 2021-10-26 LAB — TACROLIMUS LEVEL, TROUGH: TACROLIMUS, TROUGH: 8.6 ng/mL (ref 5.0–15.0)

## 2021-10-26 LAB — MAGNESIUM: MAGNESIUM: 2.1 mg/dL (ref 1.6–2.6)

## 2021-10-26 NOTE — Unmapped (Signed)
Called Spencer Mallet Sr. to discuss lab results. Tacrolimus level was normal at 8.6 with a goal of 7-9. Medication regimen is currently 1mg  BID. Discussed with [name], CPP, dosing at this time to remain the same 1mg  BID.  EBV neg. Cr better at 1.39 . Repeat labs scheduled 1 month with RTC. Resulted labs reviewed. All questions answered. Pt verbalized understanding.

## 2021-10-27 LAB — CMV DNA, QUANTITATIVE, PCR: CMV VIRAL LD: NOT DETECTED

## 2021-10-27 LAB — EBV QUANTITATIVE PCR, BLOOD: EBV VIRAL LOAD RESULT: NOT DETECTED

## 2021-11-06 MED ORDER — ACETAMINOPHEN 500 MG TABLET
ORAL_TABLET | Freq: Four times a day (QID) | ORAL | 0 refills | 23 days | Status: CP | PRN
Start: 2021-11-06 — End: 2021-12-06
  Filled 2021-11-09: qty 180, 23d supply, fill #0

## 2021-11-06 MED ORDER — AZATHIOPRINE 50 MG TABLET
ORAL_TABLET | Freq: Every day | ORAL | 3 refills | 90 days | Status: CP
Start: 2021-11-06 — End: ?
  Filled 2021-11-09: qty 90, 90d supply, fill #0

## 2021-11-06 MED ORDER — MONTELUKAST 10 MG TABLET
ORAL_TABLET | Freq: Every day | ORAL | 2 refills | 30 days | Status: CP
Start: 2021-11-06 — End: 2022-11-06
  Filled 2021-11-09: qty 30, 30d supply, fill #0

## 2021-11-06 NOTE — Unmapped (Signed)
The patient is requesting a medication refill

## 2021-11-06 NOTE — Unmapped (Signed)
[facility] Refill Coordination Note    Specialty Medication(s) to be Shipped:   Transplant: azathioprine 50 mg      Other medication(s) to be shipped:  acetaminophen , azithromycin , calcium , mag-ox , montelukast , pravastatin and terazosin     Spencer Mallet Sr., DOB: 12/07/1950  Phone: [phone] (home) [phone] (work)      All above HIPAA information was verified with patient.     Was a Nurse, learning disability used for this call? No    Completed refill call assessment today to schedule patient's medication shipment from the [facility] [phone]).  All relevant notes have been reviewed.     Specialty medication(s) and dose(s) confirmed: Regimen is correct and unchanged.   Changes to medications: Marcellous reports no changes at this time.  Changes to insurance: No  New side effects reported not previously addressed with a pharmacist or physician: None reported  Questions for the pharmacist: No    Confirmed patient received a Conservation officer, historic buildings and a Surveyor, mining with first shipment. The patient will receive a drug information handout for each medication shipped and additional FDA Medication Guides as required.       DISEASE/MEDICATION-SPECIFIC INFORMATION        N/A    SPECIALTY MEDICATION ADHERENCE     Medication Adherence    Patient reported X missed doses in the last month: 0  Specialty Medication: azathioprine 50 mg  Patient is on additional specialty medications: No              Were doses missed due to medication being on hold? No    azathioprin 50 mg: 7 days of medicine on hand       REFERRAL TO PHARMACIST     Referral to the pharmacist: Not needed      [facility]     Shipping address confirmed in Epic.     Delivery Scheduled: Yes, Expected medication delivery date: 11/11/21.     Medication will be delivered via UPS to the prescription address in Epic WAM.    [name]   [facility] Specialty Technician

## 2021-11-08 DIAGNOSIS — D509 Iron deficiency anemia, unspecified: Principal | ICD-10-CM

## 2021-11-08 DIAGNOSIS — Z942 Lung transplant status: Principal | ICD-10-CM

## 2021-11-09 MED FILL — TERAZOSIN 1 MG CAPSULE: ORAL | 30 days supply | Qty: 90 | Fill #2

## 2021-11-09 MED FILL — MAGNESIUM OXIDE 400 MG (241.3 MG MAGNESIUM) TABLET: ORAL | 30 days supply | Qty: 180 | Fill #1

## 2021-11-09 MED FILL — CALCIUM CARBONATE 600 MG CALCIUM (1,500 MG) TABLET: ORAL | 30 days supply | Qty: 60 | Fill #6

## 2021-11-09 MED FILL — PRAVASTATIN 40 MG TABLET: ORAL | 30 days supply | Qty: 30 | Fill #1

## 2021-11-12 MED FILL — AZITHROMYCIN 250 MG TABLET: ORAL | 30 days supply | Qty: 30 | Fill #6

## 2021-11-13 MED FILL — ON CALL EXPRESS TEST STRIP: 20 days supply | Qty: 100 | Fill #4

## 2021-11-17 ENCOUNTER — Ambulatory Visit: Admit: 2021-11-17 | Discharge: 2021-11-17 | Payer: MEDICARE

## 2021-11-17 ENCOUNTER — Ambulatory Visit: Admit: 2021-11-17 | Discharge: 2021-11-17 | Payer: MEDICARE | Attending: Internal Medicine | Primary: Internal Medicine

## 2021-11-17 ENCOUNTER — Encounter: Admit: 2021-11-17 | Discharge: 2021-11-17 | Payer: MEDICARE | Attending: Internal Medicine | Primary: Internal Medicine

## 2021-11-17 DIAGNOSIS — Z942 Lung transplant status: Principal | ICD-10-CM

## 2021-11-17 LAB — COMPREHENSIVE METABOLIC PANEL
ALBUMIN: 4.1 g/dL (ref 3.4–5.0)
ALKALINE PHOSPHATASE: 71 U/L (ref 46–116)
ALT (SGPT): 16 U/L (ref 10–49)
ANION GAP: 4 mmol/L — ABNORMAL LOW (ref 5–14)
AST (SGOT): 21 U/L (ref <=34)
BILIRUBIN TOTAL: 0.4 mg/dL (ref 0.3–1.2)
BLOOD UREA NITROGEN: 32 mg/dL — ABNORMAL HIGH (ref 9–23)
BUN / CREAT RATIO: 22
CALCIUM: 9.4 mg/dL (ref 8.7–10.4)
CHLORIDE: 107 mmol/L (ref 98–107)
CO2: 32 mmol/L — ABNORMAL HIGH (ref 20.0–31.0)
CREATININE: 1.43 mg/dL — ABNORMAL HIGH
EGFR CKD-EPI (2021) MALE: 52 mL/min/{1.73_m2} — ABNORMAL LOW (ref >=60)
GLUCOSE RANDOM: 108 mg/dL — ABNORMAL HIGH (ref 70–99)
POTASSIUM: 4.6 mmol/L (ref 3.4–4.8)
PROTEIN TOTAL: 6.4 g/dL (ref 5.7–8.2)
SODIUM: 143 mmol/L (ref 135–145)

## 2021-11-17 LAB — CBC W/ AUTO DIFF
BASOPHILS ABSOLUTE COUNT: 0 10*9/L (ref 0.0–0.1)
BASOPHILS RELATIVE PERCENT: 0.5 %
EOSINOPHILS ABSOLUTE COUNT: 0.1 10*9/L (ref 0.0–0.5)
EOSINOPHILS RELATIVE PERCENT: 1.6 %
HEMATOCRIT: 36.4 % — ABNORMAL LOW (ref 39.0–48.0)
HEMOGLOBIN: 12.3 g/dL — ABNORMAL LOW (ref 12.9–16.5)
LYMPHOCYTES ABSOLUTE COUNT: 0.6 10*9/L — ABNORMAL LOW (ref 1.1–3.6)
LYMPHOCYTES RELATIVE PERCENT: 15.8 %
MEAN CORPUSCULAR HEMOGLOBIN CONC: 33.8 g/dL (ref 32.0–36.0)
MEAN CORPUSCULAR HEMOGLOBIN: 30.6 pg (ref 25.9–32.4)
MEAN CORPUSCULAR VOLUME: 90.5 fL (ref 77.6–95.7)
MEAN PLATELET VOLUME: 8 fL (ref 6.8–10.7)
MONOCYTES ABSOLUTE COUNT: 0.3 10*9/L (ref 0.3–0.8)
MONOCYTES RELATIVE PERCENT: 7.8 %
NEUTROPHILS ABSOLUTE COUNT: 2.6 10*9/L (ref 1.8–7.8)
NEUTROPHILS RELATIVE PERCENT: 74.3 %
PLATELET COUNT: 127 10*9/L — ABNORMAL LOW (ref 150–450)
RED BLOOD CELL COUNT: 4.02 10*12/L — ABNORMAL LOW (ref 4.26–5.60)
RED CELL DISTRIBUTION WIDTH: 18.9 % — ABNORMAL HIGH (ref 12.2–15.2)
WBC ADJUSTED: 3.5 10*9/L — ABNORMAL LOW (ref 3.6–11.2)

## 2021-11-17 LAB — PHOSPHORUS: PHOSPHORUS: 3.8 mg/dL (ref 2.4–5.1)

## 2021-11-17 LAB — IGG: GAMMAGLOBULIN; IGG: 684 mg/dL (ref 646–2013)

## 2021-11-17 LAB — TACROLIMUS LEVEL, TROUGH: TACROLIMUS, TROUGH: 5.2 ng/mL (ref 5.0–15.0)

## 2021-11-17 LAB — MAGNESIUM: MAGNESIUM: 1.9 mg/dL (ref 1.6–2.6)

## 2021-11-17 MED ORDER — TACROLIMUS 0.5 MG CAPSULE, IMMEDIATE-RELEASE
ORAL_CAPSULE | Freq: Two times a day (BID) | ORAL | 3 refills | 90 days | Status: CP
Start: 2021-11-17 — End: 2022-11-17

## 2021-11-17 NOTE — Unmapped (Signed)
Pulmonary Transplant Clinic    HISTORY:   HPI:  Spencer Pena is a 71 y.o.  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, also with type 2 diabetes mellitus, hypertension, colon cancer in 2007 s/p partial colon and hepatic wedge resection and FOLFOX here for transplant complications    - Jamie reports doing well today, with no specific complaints   - Patient returned to work, working for [employer].  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 He wears an N95. He is retired from [employer] from [employer].   - Confirmed taking prednisone 10, imuran 50 QD, tacrolimus 1mg  BID  - On metformin and Januvia for his diabetes. Stopped Lantus on 6/9. AM sugars have been mostly < 130. PM sugars in mid 100's with a few above 200.   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     --> basiliximab induction    --> CMV high risk (D+/R-)    --> EBV moderate risk (D+/R+)    --> Not Increased Risk Donor  - Colon Cancer 2007    --> s/p resection of liver mets    --> s/p resection of 6 inches of colon    --> FOLFOX Chemo  - type 2 diabetes mellitus     --> not insulin dependent  - OA    SOCIAL HISTORY:   - 20pk years, quit in 1991  - previously drank about 10 drinks per week   - Worked maintenance at [employer]  - Married and lives in [place]    FAMILY HISTORY:   - Parents lived into late 90's    MEDS: (personally reviewed in [place], pertinent meds noted below)     Current Outpatient Medications on File Prior to Visit   Medication Sig Dispense Refill    acetaminophen (TYLENOL EXTRA STRENGTH) 500 MG tablet Take 1-2 tablets (500-1,000 mg total) by mouth every six (6) hours as needed for pain. 180 tablet PRN    acetaminophen (TYLENOL EXTRA STRENGTH) 500 MG tablet Take 1-2 tablets (500-1,000 mg total) by mouth every six (6) hours as needed for pain. 180 tablet 0    aspirin (ECOTRIN) 81 MG tablet Take 1 tablet (81 mg total) by mouth daily. 90 tablet 3    azaTHIOprine (IMURAN) 50 mg tablet Take 1 tablet (50 mg total) by mouth daily. 90 tablet 3    azithromycin (ZITHROMAX) 250 MG tablet Take 1 tablet (250 mg total) by mouth daily. 30 tablet 11    blood sugar diagnostic (ON CALL EXPRESS TEST STRIP) Strp Test daily before all meals/snacks and once before bedtime. 100 each 11    calcium carbonate (CALCIUM 600) 1,500 mg (600 mg elem calcium) tablet Take 1 tablet (600 mg of elem calcium total) by mouth Two (2) times a day. 60 tablet 11    cholecalciferol, vitamin D3 25 mcg, 1,000 units,, 1,000 unit (25 mcg) tablet Take 2 tablets (50 mcg total) by mouth daily. 100 tablet 11    fluticasone propion-salmeteroL (ADVAIR HFA) 115-21 mcg/actuation inhaler Inhale 2 puffs Two (2) times a day. 12 g 11    lancets 30 gauge Misc Test  daily before all meals/snacks and once before bedtime. 100 each 11    magnesium oxide (MAG-OX) 400 mg (241.3 mg elemental magnesium) tablet Take 3 tablets (1,200 mg total) by mouth Two (2) times a day. 180 tablet 11    metFORMIN (GLUCOPHAGE) 1000 MG tablet Take 1 tablet (1,000 mg total) by mouth Two (2) times a day. 180 tablet 3    montelukast (SINGULAIR) 10 mg tablet Take 1 tablet (10 mg total) by mouth daily. 30 tablet 2    pantoprazole (PROTONIX) 40 MG tablet Take 1 tablet (40 mg total) by mouth daily. 90 tablet 3    pen needle, diabetic (BD ULTRA-FINE NANO PEN NEEDLE) 32 gauge x 5/32 (4 mm) Ndle Use as directed for injections five (5) times a day. 150 each 11    pravastatin (PRAVACHOL) 40 MG tablet Take 1 tablet (40 mg total) by mouth daily. 30 tablet 11    predniSONE (DELTASONE) 5 MG tablet Take 1.5 tablets (7.5 mg total) by mouth daily. 135 tablet 3    SITagliptin phosphate (JANUVIA) 100 MG tablet Take 1 tablet (100 mg total) by mouth daily. 90 tablet 3    sulfamethoxazole-trimethoprim (BACTRIM) 400-80 mg per tablet Take 1 tablet (80 mg of trimethoprim total) by mouth Every Monday, Wednesday, and Friday. 36 tablet 3    tacrolimus (PROGRAF) 0.5 MG capsule Take 2 capsules (1 mg total) by mouth two (2) times a day. 360 capsule 3    terazosin (HYTRIN) 1 MG capsule Take 3 capsules (3 mg total) by mouth nightly. 90 capsule 11    valGANciclovir (VALCYTE) 450 mg tablet Take 1 tablet (450 mg total) by mouth daily. 30 tablet 11     No current facility-administered medications on file prior to visit.       OBJECTIVE DATA:   PHYSICAL EXAM:  BP 144/65  - Pulse 79  - Temp 36.6 ??C (97.9 ??F) (Tympanic)  - Ht 170.2 cm (5' 7)  - Wt 78.7 kg (173 lb 8 oz)  - SpO2 98%  - BMI 27.17 kg/m??      Gen: - awake, alert, in NAD  Derm: - easy bruising on BL arms  Vascular: - good pulses throughout,   CV: - RRR, no m/r/g  Pulm: - CTAB, good air movement BL   Abd: - soft, NT, ND, no hepatosplenomegally  Ext: - no edema of LE, no clubbing   Neuro: - Alert, oriented, follows commands. Moving all extremities spontaneously    LABS: (reviewed in Epic, pertinent values noted below)   PFTs: (personally reviewed and interpereted):    Date: FVC (% Pred) FEV1 (% Pred) FEF25-75(% Pred) DLCO TBBx/Results                           11/17/21 4.13 (113%) 3.63 (130%)      08/18/21 4.01 (109%) 3.55 (127%)      05/15/2021 4.21(114%) 3.65 (130.3%) 5.54 (254%)     04/06/2021 4.08 (111%) 3.64 (129.6%) 6.01 (274.3%)     12/31/20 3.95 (107.1%) 3.46 (122.6%) 5.20 (236.4%)     12/03/2020 3.87 (104.9%) 3.47 (122.9%) 5.69 (258.3%)     10/03/2020 3.68 (99.4) 3.13 (110.6)  4.22 (190.6)      09/05/20  3.70  3.14      08/20/20 3.65 (98%) 3.15 (111%) 4.69 (211%)      08/01/20 3.64 (98.2%) 3.10 (109.3%) 4.41 (198.8%)     07/18/20 3.49 (94%) 2.98 (105%) 4.20 (189%)  07/04/20 3.43 (92.5%) 2.97 (104.7%) 4.26 (191.7%)     06/27/2020 3.13 84.5 2.70 95.4 3.51 157.8     06/20/20 3.39 (91.4%) 2.82 (99.2%) 3.34 (150.35)     06/13/20 3.10 (72.8%) 2.66 (82.5%) 3.34 (136.2%)      05/15/20      BOLT    03/11/20  2.75 (72%)  2.36 (81%)       02/12/20  2.85 (75%)  2.44 (86%)                IMAGING: Reviewed in EMR, pertinent findings noted in assessment and plan    ASSESSMENT and PLAN     Mr. Bartosz Luginbill is a 71 y.o. man with IPF and emphysema s/p BOLT on 05/15/2020, also with type 2 diabetes mellitus, hypertension, colon cancer in 2007 s/p partial colon and hepatic wedge resection and FOLFOX here for follow up    Graft Function and Immunosuppression:  - PFTs stable and excellent, CXR clear, clinically feeling well  - HLA status: No DSAs, checked monthly, last 09/2021  - Total IgG <700 but insurance will not approve IVIG  - Immunosuppression:   - Tac goal 7-9. Previous tacrolimus 1mg  BID but increasing to 1.5mg  BID today for low level   - AZA 50 mg daily; dropped from 100 mg 08/01/20  -Stopped MMF 06/13/20 (GI upset).    - Prednisone 7.5mg  daily  - BOS PPX  - Azithromycin 250mg  daily  - ASA 81mg  daily  - Pravastatin 40mg  daily   - Advair BID  - Montelukast 10mg  daily     Antimicrobial Prophylaxis:  - CMV, high risk (D+/R-): valganciclovir 450mg  daily for Estimated Creatinine Clearance: 44.3 mL/min (A) (based on SCr of 1.43 mg/dL (H)).   - EBV, (D+/R+): negative 06/20/20  - Fungal: Posaconazole stopped at 70mo  - PCP: Bactrim SS MWF     Pancytopenia: Mild and stable over one year. Monitoring on current immunosuppressive regimen for now     Skin Bruising - start biotin and CoQ10. Likely related to prednisone and aspirin.     Pain control - previously required gabapentin, now only on PRN acetaminophen.     Concern for OSA - STOP-BANG 4 points (male, age, observed apnea, snoring). Not using CPAP.     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  - pH probe with DeMeester 16.7%, slightly in abnormal range but will hold off on surgical intervention at this time in the setting of normal graft function.  - Daily PPI     BPH - terazosin 3mg  at bedtime   CKD IIIa - monitoring    Iron Deficiency Anemia - received IV iron 05/22/20, continues to be low  Hypomagnesemia - magnesium oxide 1200 mg BID     Type 2 diabetes mellitus   - Lantus will drop to 12 units in the nightly with pred adjustment  - aspart 7/7/3 with pred adjustment  - metformin 1000mg  BID  - sugars well controlled, apart from some lower morning sugars (86-122)  - PVLS today    Bone Health: Last Dexa: Oct 2021, no osteopenia or osteoporosis  - on Ca and Vit D supplementation currently,    Health Maintenance:  - Last derm visit:  Provided numbers for  local derm   - Last Colonsocopy (if >40): Provided number to schedule, needs   - Last flu shot:   11//28/2022,   - Last Pneumovax:  06/10/2004, 12/30/2017  - Last Prevnar:  03/25/2016  - TdaP:   02/11/2018  - Shingrix:    03/10/20, 08/20/20  - COVID-19 (Moderna) 06/11/19, 07/09/19, 03/10/20, 07/18/2020, 12/03/20 received 1/2 dose Evushield,03/2021 (Bivalent)   - Last Dexa:   02/2020, 11/17/21 (today)   - Last vitamin D:  51.2 (07/18/2020)  - Last HbA1c:   5.4 (08/2021)     Immunization History   Administered Date(s) Administered    COVID-19 VAC,BIVALENT(70YR UP),PFIZER 08/18/2021    COVID-19 VACCINE,MRNA(MODERNA)(PF) 06/11/2019, 07/09/2019, 03/10/2020, 07/18/2020, 12/03/2020    HEPATITIS B VACCINE ADULT, ADJUVANTED, IM(HEPLISAV B) 03/12/2020    INFLUENZA INJ MDCK PF, QUAD,(FLUCELVAX)(54MO AND UP EGG FREE) 02/08/2020    INFLUENZA TIV (TRI) PF (IM) 02/27/2010    Influenza Vaccine Quad (IIV4 PF) 72mo+ injectable 01/19/2019, 04/06/2021    Influenza Virus Vaccine, unspecified formulation 02/07/2013, 02/28/2015, 02/05/2016, 02/02/2017, 01/27/2018    PNEUMOCOCCAL POLYSACCHARIDE 23 06/10/2004, 12/30/2017    Pneumococcal Conjugate 13-Valent 03/25/2016    SHINGRIX-ZOSTER VACCINE (HZV), RECOMBINANT,SUB-UNIT,ADJUVANTED IM 03/10/2020, 08/20/2020    TdaP 02/15/2012, 02/11/2018

## 2021-11-17 NOTE — Unmapped (Signed)
Called [name] to discuss lab results. Tacrolimus level was decreased at 5.2 with a goal of 7-9. Medication regimen is currently 1 mg BID. Discussed with [name], CPP, dosing at this time increased to 1.5 mg BID. Repeat labs scheduled two weeks. Resulted labs reviewed. All questions answered. Pt verbalized understanding.      Discussed results of dexa scan, continue on calcium/Vitamin D and repeat in a year.    Carotid and ABI PVLs normal.

## 2021-11-17 NOTE — Unmapped (Signed)
In with Dr. Dudley Major to see Spencer Pena Sr., overall doing well.  PFTs stable.  Getting dexa today.  Needs to see derm and needs colonoscopy, will provide phone numbers.  Return to clinic in 3 months.  Questions answered, verbalized understanding.

## 2021-11-18 LAB — EBV QUANTITATIVE PCR, BLOOD: EBV VIRAL LOAD RESULT: NOT DETECTED

## 2021-11-19 LAB — CMV DNA, QUANTITATIVE, PCR: CMV VIRAL LD: NOT DETECTED

## 2021-11-19 MED FILL — VALGANCICLOVIR 450 MG TABLET: ORAL | 30 days supply | Qty: 30 | Fill #4

## 2021-11-19 NOTE — Unmapped (Signed)
[facility] Clinical Assessment & Refill Coordination Note    Spencer Pena Sr., DOB: Nov 19, 1950  Phone: [phone] (home) [phone] (work)    All above HIPAA information was verified with patient.     Was a Nurse, learning disability used for this call? No    Specialty Medication(s):   Transplant: valgancyclovir 450mg      Current Outpatient Medications   Medication Sig Dispense Refill    acetaminophen (TYLENOL EXTRA STRENGTH) 500 MG tablet Take 1-2 tablets (500-1,000 mg total) by mouth every six (6) hours as needed for pain. 180 tablet PRN    acetaminophen (TYLENOL EXTRA STRENGTH) 500 MG tablet Take 1-2 tablets (500-1,000 mg total) by mouth every six (6) hours as needed for pain. 180 tablet 0    aspirin (ECOTRIN) 81 MG tablet Take 1 tablet (81 mg total) by mouth daily. 90 tablet 3    azaTHIOprine (IMURAN) 50 mg tablet Take 1 tablet (50 mg total) by mouth daily. 90 tablet 3    azithromycin (ZITHROMAX) 250 MG tablet Take 1 tablet (250 mg total) by mouth daily. 30 tablet 11    blood sugar diagnostic (ON CALL EXPRESS TEST STRIP) Strp Test daily before all meals/snacks and once before bedtime. 100 each 11    calcium carbonate (CALCIUM 600) 1,500 mg (600 mg elem calcium) tablet Take 1 tablet (600 mg of elem calcium total) by mouth Two (2) times a day. 60 tablet 11    cholecalciferol, vitamin D3 25 mcg, 1,000 units,, 1,000 unit (25 mcg) tablet Take 2 tablets (50 mcg total) by mouth daily. 100 tablet 11    fluticasone propion-salmeteroL (ADVAIR HFA) 115-21 mcg/actuation inhaler Inhale 2 puffs Two (2) times a day. 12 g 11    lancets 30 gauge Misc Test daily before all meals/snacks and once before bedtime. 100 each 11    magnesium oxide (MAG-OX) 400 mg (241.3 mg elemental magnesium) tablet Take 3 tablets (1,200 mg total) by mouth Two (2) times a day. 180 tablet 11    metFORMIN (GLUCOPHAGE) 1000 MG tablet Take 1 tablet (1,000 mg total) by mouth Two (2) times a day. 180 tablet 3    montelukast (SINGULAIR) 10 mg tablet Take 1 tablet (10 mg total) by mouth daily. 30 tablet 2    pantoprazole (PROTONIX) 40 MG tablet Take 1 tablet (40 mg total) by mouth daily. 90 tablet 3    pen needle, diabetic (BD ULTRA-FINE NANO PEN NEEDLE) 32 gauge x 5/32 (4 mm) Ndle Use as directed for injections five (5) times a day. 150 each 11    pravastatin (PRAVACHOL) 40 MG tablet Take 1 tablet (40 mg total) by mouth daily. 30 tablet 11    predniSONE (DELTASONE) 5 MG tablet Take 1.5 tablets (7.5 mg total) by mouth daily. 135 tablet 3    SITagliptin phosphate (JANUVIA) 100 MG tablet Take 1 tablet (100 mg total) by mouth daily. 90 tablet 3    sulfamethoxazole-trimethoprim (BACTRIM) 400-80 mg per tablet Take 1 tablet (80 mg of trimethoprim total) by mouth Every Monday, Wednesday, and Friday. 36 tablet 3    tacrolimus (PROGRAF) 0.5 MG capsule Take 3 capsules (1.5 mg total) by mouth two (2) times a day. 540 capsule 3    terazosin (HYTRIN) 1 MG capsule Take 3 capsules (3 mg total) by mouth nightly. 90 capsule 11    valGANciclovir (VALCYTE) 450 mg tablet Take 1 tablet (450 mg total) by mouth daily. 30 tablet 11     No current facility-administered medications for  this visit.        Changes to medications: Haziel reports no changes at this time.    Allergies   Allergen Reactions    Cetuximab Anaphylaxis     Chest pain/rapid heart rate/ BP dropped    Nsaids (Non-Steroidal Anti-Inflammatory Drug)      Interaction with transplant medications    Venom-Honey Bee Anaphylaxis    Enalapril      Angioedema      Pollen Extracts Other (See Comments)     Nasal stuffiness, coughing, sneezing       Changes to allergies: No    SPECIALTY MEDICATION ADHERENCE     Valganciclovir 450 mg: 4 days of medicine on hand     Medication Adherence    Patient reported X missed doses in the last month: 0  Specialty Medication: Valganciclovir 450mg   Patient is on additional specialty medications: No          Specialty medication(s) dose(s) confirmed: Regimen is correct and unchanged.     Are there any concerns with adherence? No    Adherence counseling provided? Not needed    CLINICAL MANAGEMENT AND INTERVENTION      Clinical Benefit Assessment:    Do you feel the medicine is effective or helping your condition? Yes    Clinical Benefit counseling provided? Not needed    Adverse Effects Assessment:    Are you experiencing any side effects? No    Are you experiencing difficulty administering your medicine? No    Quality of Life Assessment:           How many days over the past month did your lung transplant  keep you from your normal activities? For example, brushing your teeth or getting up in the morning. 0    Have you discussed this with your provider? Not needed    Acute Infection Status:    Acute infections noted within Epic:  No active infections  Patient reported infection: None    Therapy Appropriateness:    Is therapy appropriate and patient progressing towards therapeutic goals? Yes, therapy is appropriate and should be continued    DISEASE/MEDICATION-SPECIFIC INFORMATION      N/A    PATIENT SPECIFIC NEEDS     Does the patient have any physical, cognitive, or cultural barriers? No    Is the patient high risk? No    Does the patient require a Care Management Plan? No     SOCIAL DETERMINANTS OF HEALTH     At the [facility], we have learned that life circumstances - like trouble affording food, housing, utilities, or transportation can affect the health of many of our patients.   That is why we wanted to ask: are you currently experiencing any life circumstances that are negatively impacting your health and/or quality of life? No    Social Determinants of Psychologist, prison and probation services Strain: Low Risk     Difficulty of Paying Living Expenses: Not hard at all   Internet Connectivity: Internet connectivity concern identified    Do you have access to internet services: No    How do you connect to the internet: Not on file    Is your internet connection strong enough for you to watch video on your device without major problems?: Not on file    Do you have enough data to get through the month?: Not on file    Does at least one of the devices have a camera that you can  use for video chat?: Not on file   Food Insecurity: No Food Insecurity    Worried About Running Out of Food in the Last Year: Never true    Ran Out of Food in the Last Year: Never true   Tobacco Use: Medium Risk    Smoking Tobacco Use: Former    Smokeless Tobacco Use: Former    Passive Exposure: Never   Housing/Utilities: Low Risk     Within the past 12 months, have you ever stayed: outside, in a car, in a tent, in an overnight shelter, or temporarily in someone else's home (i.e. couch-surfing)?: No    Are you worried about losing your housing?: No    Within the past 12 months, have you been unable to get utilities (heat, electricity) when it was really needed?: No   Alcohol Use: Not At Risk    How often do you have a drink containing alcohol?: 2 - 3 times per week    How many drinks containing alcohol do you have on a typical day when you are drinking?: 1 - 2    How often do you have 5 or more drinks on one occasion?: Never   Transportation Needs: No Transportation Needs    Lack of Transportation (Medical): No    Lack of Transportation (Non-Medical): No   Substance Use: Low Risk     Taken prescription drugs for non-medical reasons: Never    Taken illegal drugs: Never    Patient indicated they have taken drugs in the past year for non-medical reasons: Yes, [positive answer(s)]: Not on file   Health Literacy: Medium Risk    : Rarely   Physical Activity: Sufficiently Active    Days of Exercise per Week: 7 days    Minutes of Exercise per Session: 60 min   Interpersonal Safety: Not at risk    Unsafe Where You Currently Live: No    Physically Hurt by Anyone: No    Abused by Anyone: No   Stress: No Stress Concern Present    Feeling of Stress : Not at all   Intimate Partner Violence: Not At Risk    Fear of Current or Ex-Partner: No    Emotionally Abused: No    Physically Abused: No    Sexually Abused: No   Depression: Not at risk    PHQ-2 Score: 0   Social Connections: Moderately Isolated    Frequency of Communication with Friends and Family: More than three times a week    Frequency of Social Gatherings with Friends and Family: Once a week    Attends Religious Services: Never    Database administrator or Organizations: No    Attends Engineer, structural: Never    Marital Status: Married       Would you be willing to receive help with any of the needs that you have identified today? Not applicable       SHIPPING     Specialty Medication(s) to be Shipped:   Transplant: valgancyclovir 450mg     Other medication(s) to be shipped: No additional medications requested for fill at this time     Changes to insurance: No    Delivery Scheduled: Yes, Expected medication delivery date: 11/20/21.     Medication will be delivered via UPS to the confirmed prescription address in [facility].    The patient will receive a drug information handout for each medication shipped and additional FDA Medication Guides as required.  Verified that patient has previously  received a Conservation officer, historic buildings and a Surveyor, mining.    The patient or caregiver noted above participated in the development of this care plan and knows that they can request review of or adjustments to the care plan at any time.      All of the patient's questions and concerns have been addressed.    Tera Helper   [facility] Specialty Pharmacist

## 2021-11-30 LAB — HLA DS POST TRANSPLANT
ANTI-DONOR DRW #1 MFI: 0 MFI
ANTI-DONOR DRW #2 MFI: 0 MFI
ANTI-DONOR HLA-A #1 MFI: 10 MFI
ANTI-DONOR HLA-A #2 MFI: 0 MFI
ANTI-DONOR HLA-B #1 MFI: 0 MFI
ANTI-DONOR HLA-B #2 MFI: 0 MFI
ANTI-DONOR HLA-C #2 MFI: 0 MFI
ANTI-DONOR HLA-DQB #1 MFI: 0 MFI
ANTI-DONOR HLA-DQB #2 MFI: 0 MFI
ANTI-DONOR HLA-DR #1 MFI: 0 MFI
ANTI-DONOR HLA-DR #2 MFI: 0 MFI

## 2021-11-30 LAB — FSAB CLASS 1 ANTIBODY SPECIFICITY: HLA CLASS 1 ANTIBODY RESULT: NEGATIVE

## 2021-11-30 LAB — FSAB CLASS 2 ANTIBODY SPECIFICITY: HLA CL2 AB RESULT: NEGATIVE

## 2021-12-01 ENCOUNTER — Other Ambulatory Visit: Admit: 2021-12-01 | Discharge: 2021-12-02 | Payer: MEDICARE

## 2021-12-01 DIAGNOSIS — Z942 Lung transplant status: Principal | ICD-10-CM

## 2021-12-01 LAB — CBC W/ AUTO DIFF
BASOPHILS ABSOLUTE COUNT: 0 10*9/L (ref 0.0–0.1)
BASOPHILS RELATIVE PERCENT: 0.6 %
EOSINOPHILS ABSOLUTE COUNT: 0.1 10*9/L (ref 0.0–0.5)
EOSINOPHILS RELATIVE PERCENT: 1.6 %
HEMATOCRIT: 37.7 % — ABNORMAL LOW (ref 39.0–48.0)
HEMOGLOBIN: 12.6 g/dL — ABNORMAL LOW (ref 12.9–16.5)
LYMPHOCYTES ABSOLUTE COUNT: 0.6 10*9/L — ABNORMAL LOW (ref 1.1–3.6)
LYMPHOCYTES RELATIVE PERCENT: 13.9 %
MEAN CORPUSCULAR HEMOGLOBIN CONC: 33.5 g/dL (ref 32.0–36.0)
MEAN CORPUSCULAR HEMOGLOBIN: 30.7 pg (ref 25.9–32.4)
MEAN CORPUSCULAR VOLUME: 91.7 fL (ref 77.6–95.7)
MEAN PLATELET VOLUME: 7.8 fL (ref 6.8–10.7)
MONOCYTES ABSOLUTE COUNT: 0.4 10*9/L (ref 0.3–0.8)
MONOCYTES RELATIVE PERCENT: 8.1 %
NEUTROPHILS ABSOLUTE COUNT: 3.6 10*9/L (ref 1.8–7.8)
NEUTROPHILS RELATIVE PERCENT: 75.8 %
PLATELET COUNT: 138 10*9/L — ABNORMAL LOW (ref 150–450)
RED BLOOD CELL COUNT: 4.11 10*12/L — ABNORMAL LOW (ref 4.26–5.60)
RED CELL DISTRIBUTION WIDTH: 18.3 % — ABNORMAL HIGH (ref 12.2–15.2)
WBC ADJUSTED: 4.7 10*9/L (ref 3.6–11.2)

## 2021-12-01 LAB — COMPREHENSIVE METABOLIC PANEL
ALBUMIN: 4.3 g/dL (ref 3.4–5.0)
ALKALINE PHOSPHATASE: 71 U/L (ref 46–116)
ALT (SGPT): 18 U/L (ref 10–49)
ANION GAP: 7 mmol/L (ref 5–14)
AST (SGOT): 22 U/L (ref <=34)
BILIRUBIN TOTAL: 0.4 mg/dL (ref 0.3–1.2)
BLOOD UREA NITROGEN: 32 mg/dL — ABNORMAL HIGH (ref 9–23)
BUN / CREAT RATIO: 21
CALCIUM: 9.3 mg/dL (ref 8.7–10.4)
CHLORIDE: 108 mmol/L — ABNORMAL HIGH (ref 98–107)
CO2: 29 mmol/L (ref 20.0–31.0)
CREATININE: 1.53 mg/dL — ABNORMAL HIGH
EGFR CKD-EPI (2021) MALE: 48 mL/min/{1.73_m2} — ABNORMAL LOW (ref >=60)
GLUCOSE RANDOM: 112 mg/dL — ABNORMAL HIGH (ref 70–99)
POTASSIUM: 4.4 mmol/L (ref 3.4–4.8)
PROTEIN TOTAL: 6.7 g/dL (ref 5.7–8.2)
SODIUM: 144 mmol/L (ref 135–145)

## 2021-12-01 LAB — TACROLIMUS LEVEL, TROUGH: TACROLIMUS, TROUGH: 6.5 ng/mL (ref 5.0–15.0)

## 2021-12-01 LAB — MAGNESIUM: MAGNESIUM: 2.1 mg/dL (ref 1.6–2.6)

## 2021-12-01 LAB — PHOSPHORUS: PHOSPHORUS: 3.1 mg/dL (ref 2.4–5.1)

## 2021-12-01 LAB — IGG: GAMMAGLOBULIN; IGG: 656 mg/dL (ref 646–2013)

## 2021-12-01 NOTE — Unmapped (Signed)
Called [name] to discuss lab results. Tacrolimus level was essentially normal at 6.5 with a goal of 7-9. Medication regimen is currently 1.5 mg BID. Discussed Dosing at this time to remain the same. Repeat labs scheduled monthly. Resulted labs reviewed. All questions answered. Pt verbalized understanding.

## 2021-12-02 NOTE — Unmapped (Signed)
12/02/21 Pt said he will call back tomorrow when he is home. He want's to look at his medications so he can say what he would like for [place] to refill. sed

## 2021-12-03 LAB — CMV DNA, QUANTITATIVE, PCR: CMV VIRAL LD: NOT DETECTED

## 2021-12-03 MED ORDER — PEG 3350-ELECTROLYTES 236 GRAM-22.74 GRAM-6.74 GRAM-5.86 GRAM SOLUTION
ORAL | 0 refills | 0 days | Status: CP
Start: 2021-12-03 — End: ?

## 2021-12-03 NOTE — Unmapped (Signed)
Colonoscopy  Procedure #1      0  Procedure #2      [id number]  MRN      GENERIC  Endoscopist      FALSE  Is the patient's health insurance [employer], [place] [facility]), or [employer] Med Advantage?      FALSE  Urgent procedure      FALSE  Do you take: Plavix (clopidogrel), Coumadin (warfarin), Lovenox (enoxaparin), Pradaxa (dabigatran), Effient (prasugrel), Xarelto (rivaroxaban), Eliquis (apixaban), Pletal (cilostazol), or Brilinta (ticagrelor)?    FALSE  Do you have hemophilia, von Willebrand disease, thrombocytopenia?      FALSE  Do you have a pacemaker or implanted cardiac defibrillator?      FALSE  Are you pregnant?      FALSE  Has a [facility] provider specified the location(s)?        Which location(s) did the [facility] provider specify?      FALSE     [facility]      FALSE     [place]      FALSE     [facility]-Propofol      FALSE     HMOB-Mod Sedation      FALSE  Is procedure indication for variceal banding (this does NOT include variceal screening)?              5  Height (feet)      7.5  Height (inches)      170  Weight (pounds)      26.2  BMI              FALSE  Did the ordering provider specify a bowel prep?      0       What bowel prep was specified?      FALSE  Do you have chronic kidney disease?      FALSE  Do you have chronic constipation or have you had poor quality bowel preps for past colonoscopies?      FALSE  Do you have Crohn's disease or ulcerative colitis?      FALSE  Have you had weight loss surgery?              FALSE  Are you in the process of scheduling or awaiting results of a heart ultrasound, stress test, or catheterization to evaluate new or worsening chest pain, dizziness, or shortness of breath?     FALSE  When you walk around your house or grocery store, do you have to stop and rest due to shortness of breath, chest pain, or light-headedness?      FALSE  Have you had a heart attack, stroke or heart stent placement within the past 6 months?      FALSE  Do you ever use supplemental oxygen?      FALSE  Have you been hospitalized for cirrhosis of the liver or heart failure in the last 12 months?      FALSE  Have you been treated for mouth or throat cancer with radiation or surgery?      FALSE  Have you been told that it is difficult for doctors to insert a breathing tube in you during anesthesia?      TRUE  Have you had a heart or lung transplant?              FALSE  Are you on dialysis?      FALSE  Do you have cirrhosis of the liver?  FALSE  Do you have myasthenia gravis?      FALSE  Is the patient a prisoner?              FALSE  Have you been diagnosed with sleep apnea or do you wear a CPAP machine at night?      FALSE  Are you younger than 30?      FALSE  Have you previously received propofol sedation administered by an anesthesiologist for a GI procedure?      FALSE  Do you drink an average of more than 3 drinks of alcohol per day?      FALSE  Do you regularly take suboxone or any prescription medications for chronic pain?      FALSE  Do you regularly take Ativan, Klonopin, Xanax, Valium, lorazepam, clonazepam, alprazolam, or diazepam?      FALSE  Have you previously had difficulty with sedation during a GI procedure?      FALSE  Have you been diagnosed with PTSD?      FALSE  Are you allergic to fentanyl or midazolam (Versed)?      FALSE  Do you take medications for HIV?      ################### ################################################################################################################### ################ ############### ###############   MRN:          [id number]      Anticoag Review:  No      Nurse Triage:  No      GI Clinic Consult:  No      Procedure(s):  Colonoscopy 0     [facility]      Endoscopist:  GENERIC      Urgent:            No       Prep:               Nulytely              ################### ################################################################################################################### ################ ############### ###############

## 2021-12-04 LAB — EBV QUANTITATIVE PCR, BLOOD: EBV VIRAL LOAD RESULT: NOT DETECTED

## 2021-12-04 MED ORDER — ACETAMINOPHEN 500 MG TABLET
ORAL_TABLET | Freq: Four times a day (QID) | ORAL | 0 refills | 23 days | PRN
Start: 2021-12-04 — End: 2022-01-03

## 2021-12-07 MED ORDER — ACETAMINOPHEN 500 MG TABLET
ORAL_TABLET | Freq: Four times a day (QID) | ORAL | 0 refills | 23 days | Status: CP | PRN
Start: 2021-12-07 — End: 2022-01-06
  Filled 2021-12-08: qty 180, 23d supply, fill #0

## 2021-12-07 NOTE — Unmapped (Signed)
Pt request for RX Refill

## 2021-12-08 MED FILL — PRAVASTATIN 40 MG TABLET: ORAL | 30 days supply | Qty: 30 | Fill #2

## 2021-12-08 MED FILL — AZITHROMYCIN 250 MG TABLET: ORAL | 30 days supply | Qty: 30 | Fill #7

## 2021-12-08 MED FILL — CHOLECALCIFEROL (VITAMIN D3) 25 MCG (1,000 UNIT) TABLET: ORAL | 50 days supply | Qty: 100 | Fill #3

## 2021-12-08 MED FILL — TERAZOSIN 1 MG CAPSULE: ORAL | 30 days supply | Qty: 90 | Fill #3

## 2021-12-08 MED FILL — ON CALL EXPRESS TEST STRIP: 20 days supply | Qty: 100 | Fill #5

## 2021-12-08 MED FILL — MONTELUKAST 10 MG TABLET: ORAL | 30 days supply | Qty: 30 | Fill #1

## 2021-12-08 MED FILL — PREDNISONE 5 MG TABLET: ORAL | 90 days supply | Qty: 135 | Fill #1

## 2021-12-08 MED FILL — CALCIUM CARBONATE 600 MG CALCIUM (1,500 MG) TABLET: ORAL | 30 days supply | Qty: 60 | Fill #7

## 2021-12-08 MED FILL — MAGNESIUM OXIDE 400 MG (241.3 MG MAGNESIUM) TABLET: ORAL | 30 days supply | Qty: 180 | Fill #2

## 2021-12-08 NOTE — Unmapped (Signed)
Spoke with [name] regarding upcoming GI procedure on 12/11/21 at [facility] location at 1315 with an arrival time of 1215.     Reviewed registration in [facility] at time of arrival.    Verified that [facility] prep instructions had been received, reviewed and understood.      Verified prep medications ordered/received.    Reviewed information regarding holding metformin and Januvia day of procedure (until after procedure), as well as holding vitamins/supplements the day before and day of procedure.    Reviewed low fiber diet should have started three days prior to procedure.    Reviewed LIQUID diet requirement entire day prior to procedure as well as NOTHING AT ALL 2 hours prior to procedure    Reviewed driver requirement (18 or older, must be able to drive patient home, must remain within 20 minutes of facility and reachable by cell phone).      No further questions at this time.

## 2021-12-11 ENCOUNTER — Encounter: Admit: 2021-12-11 | Discharge: 2021-12-11 | Payer: MEDICARE

## 2021-12-11 ENCOUNTER — Ambulatory Visit: Admit: 2021-12-11 | Discharge: 2021-12-11 | Payer: MEDICARE

## 2021-12-11 MED ADMIN — propofoL (DIPRIVAN) injection: INTRAVENOUS | @ 19:00:00 | Stop: 2021-12-11

## 2021-12-11 MED ADMIN — lidocaine (XYLOCAINE) 20 mg/mL (2 %) injection: INTRAVENOUS | @ 19:00:00 | Stop: 2021-12-11

## 2021-12-11 MED ADMIN — propofol (DIPRIVAN) infusion 10 mg/mL: INTRAVENOUS | @ 19:00:00 | Stop: 2021-12-11

## 2021-12-11 MED ADMIN — lactated Ringers infusion: 10 mL/h | INTRAVENOUS | @ 17:00:00 | Stop: 2021-12-11

## 2021-12-13 DIAGNOSIS — Z942 Lung transplant status: Principal | ICD-10-CM

## 2021-12-13 DIAGNOSIS — D509 Iron deficiency anemia, unspecified: Principal | ICD-10-CM

## 2021-12-24 NOTE — Unmapped (Signed)
[facility] Refill Coordination Note    Specialty Medication(s) to be Shipped:   Transplant: valgancyclovir 450mg     Other medication(s) to be shipped:  terazosin     Spencer Mallet Sr., DOB: February 14, 1951  Phone: [phone] (home) [phone] (work)      All above HIPAA information was verified with patient.     Was a Nurse, learning disability used for this call? No    Completed refill call assessment today to schedule patient's medication shipment from the [facility] [phone]).  All relevant notes have been reviewed.     Specialty medication(s) and dose(s) confirmed: Regimen is correct and unchanged.   Changes to medications: Spencer Pena reports no changes at this time.  Changes to insurance: No  New side effects reported not previously addressed with a pharmacist or physician: None reported  Questions for the pharmacist: No    Confirmed patient received a Conservation officer, historic buildings and a Surveyor, mining with first shipment. The patient will receive a drug information handout for each medication shipped and additional FDA Medication Guides as required.       DISEASE/MEDICATION-SPECIFIC INFORMATION        N/A    SPECIALTY MEDICATION ADHERENCE     Medication Adherence    Patient reported X missed doses in the last month: 0  Specialty Medication: valganciclovir 450 mg  Patient is on additional specialty medications: No                                Were doses missed due to medication being on hold? No    valganciclovir 450 mg: 6 days of medicine on hand       REFERRAL TO PHARMACIST     Referral to the pharmacist: Not needed      [facility]     Shipping address confirmed in Epic.     Delivery Scheduled: Yes, Expected medication delivery date: 12/30/21.     Medication will be delivered via Same Day Courier to the prescription address in Epic WAM.    Spencer Pena   [facility] Specialty Technician

## 2021-12-29 ENCOUNTER — Ambulatory Visit: Admit: 2021-12-29 | Discharge: 2021-12-30 | Payer: MEDICARE

## 2021-12-29 LAB — CBC W/ AUTO DIFF
BASOPHILS ABSOLUTE COUNT: 0 10*9/L (ref 0.0–0.1)
BASOPHILS RELATIVE PERCENT: 0.6 %
EOSINOPHILS ABSOLUTE COUNT: 0.1 10*9/L (ref 0.0–0.5)
EOSINOPHILS RELATIVE PERCENT: 1.3 %
HEMATOCRIT: 39.4 % (ref 39.0–48.0)
HEMOGLOBIN: 13.4 g/dL (ref 12.9–16.5)
LYMPHOCYTES ABSOLUTE COUNT: 0.7 10*9/L — ABNORMAL LOW (ref 1.1–3.6)
LYMPHOCYTES RELATIVE PERCENT: 14.9 %
MEAN CORPUSCULAR HEMOGLOBIN CONC: 34 g/dL (ref 32.0–36.0)
MEAN CORPUSCULAR HEMOGLOBIN: 31.5 pg (ref 25.9–32.4)
MEAN CORPUSCULAR VOLUME: 92.8 fL (ref 77.6–95.7)
MEAN PLATELET VOLUME: 8 fL (ref 6.8–10.7)
MONOCYTES ABSOLUTE COUNT: 0.3 10*9/L (ref 0.3–0.8)
MONOCYTES RELATIVE PERCENT: 5.8 %
NEUTROPHILS ABSOLUTE COUNT: 3.8 10*9/L (ref 1.8–7.8)
NEUTROPHILS RELATIVE PERCENT: 77.4 %
PLATELET COUNT: 148 10*9/L — ABNORMAL LOW (ref 150–450)
RED BLOOD CELL COUNT: 4.24 10*12/L — ABNORMAL LOW (ref 4.26–5.60)
RED CELL DISTRIBUTION WIDTH: 16.8 % — ABNORMAL HIGH (ref 12.2–15.2)
WBC ADJUSTED: 5 10*9/L (ref 3.6–11.2)

## 2021-12-29 LAB — COMPREHENSIVE METABOLIC PANEL
ALBUMIN: 4.2 g/dL (ref 3.4–5.0)
ALKALINE PHOSPHATASE: 82 U/L (ref 46–116)
ALT (SGPT): 17 U/L (ref 10–49)
ANION GAP: 8 mmol/L (ref 5–14)
AST (SGOT): 21 U/L (ref <=34)
BILIRUBIN TOTAL: 0.3 mg/dL (ref 0.3–1.2)
BLOOD UREA NITROGEN: 29 mg/dL — ABNORMAL HIGH (ref 9–23)
BUN / CREAT RATIO: 17
CALCIUM: 9.4 mg/dL (ref 8.7–10.4)
CHLORIDE: 106 mmol/L (ref 98–107)
CO2: 29 mmol/L (ref 20.0–31.0)
CREATININE: 1.72 mg/dL — ABNORMAL HIGH
EGFR CKD-EPI (2021) MALE: 42 mL/min/{1.73_m2} — ABNORMAL LOW (ref >=60)
GLUCOSE RANDOM: 117 mg/dL — ABNORMAL HIGH (ref 70–99)
POTASSIUM: 4.4 mmol/L (ref 3.4–4.8)
PROTEIN TOTAL: 6.8 g/dL (ref 5.7–8.2)
SODIUM: 143 mmol/L (ref 135–145)

## 2021-12-29 LAB — PHOSPHORUS: PHOSPHORUS: 2.7 mg/dL (ref 2.4–5.1)

## 2021-12-29 LAB — MAGNESIUM: MAGNESIUM: 2 mg/dL (ref 1.6–2.6)

## 2021-12-29 LAB — IGG: GAMMAGLOBULIN; IGG: 707 mg/dL (ref 646–2013)

## 2021-12-29 LAB — TACROLIMUS LEVEL, TROUGH: TACROLIMUS, TROUGH: 9.9 ng/mL (ref 5.0–15.0)

## 2021-12-29 NOTE — Unmapped (Signed)
Called Spencer Pena Sr. to discuss lab results. Tacrolimus level was increased at 9.9 with a goal of 7-9. Medication regimen is currently 1.5 mg BID. Discussed with [name], CPP, dosing at this time decreased to  1.5/1. Repeat labs scheduled two weeks. Resulted labs reviewed. All questions answered. Pt verbalized understanding.

## 2021-12-30 MED FILL — VALGANCICLOVIR 450 MG TABLET: ORAL | 30 days supply | Qty: 30 | Fill #5

## 2021-12-30 MED FILL — TERAZOSIN 1 MG CAPSULE: ORAL | 30 days supply | Qty: 90 | Fill #4

## 2021-12-31 LAB — CMV DNA, QUANTITATIVE, PCR: CMV VIRAL LD: NOT DETECTED

## 2022-01-01 LAB — EBV QUANTITATIVE PCR, BLOOD: EBV VIRAL LOAD RESULT: NOT DETECTED

## 2022-01-04 DIAGNOSIS — Z942 Lung transplant status: Principal | ICD-10-CM

## 2022-01-04 MED ORDER — TACROLIMUS 0.5 MG CAPSULE, IMMEDIATE-RELEASE
ORAL_CAPSULE | ORAL | 3 refills | 90 days | Status: CP
Start: 2022-01-04 — End: 2023-01-04

## 2022-01-07 DIAGNOSIS — Z298 Encounter for other specified prophylactic measures: Principal | ICD-10-CM

## 2022-01-08 MED ORDER — SULFAMETHOXAZOLE 400 MG-TRIMETHOPRIM 80 MG TABLET
ORAL_TABLET | ORAL | 3 refills | 84 days | Status: CP
Start: 2022-01-08 — End: 2023-01-08
  Filled 2022-01-12: qty 36, 84d supply, fill #0

## 2022-01-10 DIAGNOSIS — Z942 Lung transplant status: Principal | ICD-10-CM

## 2022-01-10 DIAGNOSIS — D509 Iron deficiency anemia, unspecified: Principal | ICD-10-CM

## 2022-01-12 ENCOUNTER — Ambulatory Visit: Admit: 2022-01-12 | Discharge: 2022-01-13 | Payer: MEDICARE

## 2022-01-12 DIAGNOSIS — Z942 Lung transplant status: Principal | ICD-10-CM

## 2022-01-12 LAB — COMPREHENSIVE METABOLIC PANEL
ALBUMIN: 4.1 g/dL (ref 3.4–5.0)
ALKALINE PHOSPHATASE: 80 U/L (ref 46–116)
ALT (SGPT): 19 U/L (ref 10–49)
ANION GAP: 5 mmol/L (ref 5–14)
AST (SGOT): 23 U/L (ref <=34)
BILIRUBIN TOTAL: 0.3 mg/dL (ref 0.3–1.2)
BLOOD UREA NITROGEN: 34 mg/dL — ABNORMAL HIGH (ref 9–23)
BUN / CREAT RATIO: 20
CALCIUM: 9.5 mg/dL (ref 8.7–10.4)
CHLORIDE: 107 mmol/L (ref 98–107)
CO2: 32 mmol/L — ABNORMAL HIGH (ref 20.0–31.0)
CREATININE: 1.73 mg/dL — ABNORMAL HIGH
EGFR CKD-EPI (2021) MALE: 42 mL/min/{1.73_m2} — ABNORMAL LOW (ref >=60)
GLUCOSE RANDOM: 109 mg/dL — ABNORMAL HIGH (ref 70–99)
POTASSIUM: 4.3 mmol/L (ref 3.4–4.8)
PROTEIN TOTAL: 6.5 g/dL (ref 5.7–8.2)
SODIUM: 144 mmol/L (ref 135–145)

## 2022-01-12 LAB — TACROLIMUS LEVEL: TACROLIMUS BLOOD: 9.3 ng/mL

## 2022-01-12 MED FILL — MAGNESIUM OXIDE 400 MG (241.3 MG MAGNESIUM) TABLET: ORAL | 30 days supply | Qty: 180 | Fill #3

## 2022-01-12 MED FILL — JANUVIA 100 MG TABLET: ORAL | 90 days supply | Qty: 90 | Fill #1

## 2022-01-12 MED FILL — MONTELUKAST 10 MG TABLET: ORAL | 30 days supply | Qty: 30 | Fill #2

## 2022-01-12 MED FILL — METFORMIN 1,000 MG TABLET: ORAL | 90 days supply | Qty: 180 | Fill #1

## 2022-01-12 MED FILL — CALCIUM CARBONATE 600 MG CALCIUM (1,500 MG) TABLET: ORAL | 30 days supply | Qty: 60 | Fill #8

## 2022-01-12 MED FILL — AZITHROMYCIN 250 MG TABLET: ORAL | 30 days supply | Qty: 30 | Fill #8

## 2022-01-12 MED FILL — PRAVASTATIN 40 MG TABLET: ORAL | 30 days supply | Qty: 30 | Fill #3

## 2022-01-12 NOTE — Unmapped (Signed)
Called Spencer Mallet Sr. to discuss lab results. Tacrolimus level was essentially normal at 9.3 with a goal of 7-9. Medication regimen is currently 1.5/1. Discussed with [name], CPP, dosing at this time to remain the same Cr elevated at 1.73. Repeat labs scheduled two weeks. Resulted labs reviewed. All questions answered. Pt verbalized understanding.

## 2022-01-22 MED FILL — CHOLECALCIFEROL (VITAMIN D3) 25 MCG (1,000 UNIT) TABLET: ORAL | 50 days supply | Qty: 100 | Fill #4

## 2022-01-22 MED FILL — VALGANCICLOVIR 450 MG TABLET: ORAL | 30 days supply | Qty: 30 | Fill #6

## 2022-01-22 MED FILL — ON CALL EXPRESS TEST STRIP: 20 days supply | Qty: 100 | Fill #6

## 2022-01-22 NOTE — Unmapped (Signed)
[facility] Refill Coordination Note    Specialty Medication(s) to be Shipped:   Transplant: valgancyclovir 450mg     Other medication(s) to be shipped:  test strips, vit D     Spencer TOPF Sr., DOB: 28-Dec-1950  Phone: [phone] (home) [phone] (work)      All above HIPAA information was verified with patient.     Was a Nurse, learning disability used for this call? No    Completed refill call assessment today to schedule patient's medication shipment from the [facility] [phone]).  All relevant notes have been reviewed.     Specialty medication(s) and dose(s) confirmed: Regimen is correct and unchanged.   Changes to medications: Cashawn reports no changes at this time.  Changes to insurance: No  New side effects reported not previously addressed with a pharmacist or physician: None reported  Questions for the pharmacist: No    Confirmed patient received a Conservation officer, historic buildings and a Surveyor, mining with first shipment. The patient will receive a drug information handout for each medication shipped and additional FDA Medication Guides as required.       DISEASE/MEDICATION-SPECIFIC INFORMATION        N/A    SPECIALTY MEDICATION ADHERENCE     Medication Adherence    Patient reported X missed doses in the last month: 0  Specialty Medication: valGANciclovir 450 mg tablet (VALCYTE)  Patient is on additional specialty medications: No  Patient is on more than two specialty medications: No                            valgancyclovir 450mg   5 days worth of medication on hand.      Were doses missed due to medication being on hold? No      REFERRAL TO PHARMACIST     Referral to the pharmacist: Not needed      [facility]     Shipping address confirmed in Epic.     Delivery Scheduled: Yes, Expected medication delivery date: 01/25/22.     Medication will be delivered via UPS to the prescription address in Epic WAM.    Spencer Pena   [facility] Specialty Technician

## 2022-01-23 NOTE — Unmapped (Signed)
Bucky paged, he and his daughter recently traveled to [place], his daughter tested positive for COVID today.     Cott is asymptomatic, and has tested negative. I advised him to monitor symptoms, and call back if he starts feeling poorly.     Patient verbalized understanding, all questions answered.

## 2022-01-26 MED FILL — TERAZOSIN 1 MG CAPSULE: ORAL | 30 days supply | Qty: 90 | Fill #5

## 2022-01-27 ENCOUNTER — Ambulatory Visit: Admit: 2022-01-27 | Discharge: 2022-01-28 | Payer: MEDICARE

## 2022-01-27 LAB — COMPREHENSIVE METABOLIC PANEL
ALBUMIN: 4.1 g/dL (ref 3.4–5.0)
ALKALINE PHOSPHATASE: 79 U/L (ref 46–116)
ALT (SGPT): 21 U/L (ref 10–49)
ANION GAP: 7 mmol/L (ref 5–14)
AST (SGOT): 25 U/L (ref <=34)
BILIRUBIN TOTAL: 0.4 mg/dL (ref 0.3–1.2)
BLOOD UREA NITROGEN: 28 mg/dL — ABNORMAL HIGH (ref 9–23)
BUN / CREAT RATIO: 16
CALCIUM: 9.7 mg/dL (ref 8.7–10.4)
CHLORIDE: 107 mmol/L (ref 98–107)
CO2: 30 mmol/L (ref 20.0–31.0)
CREATININE: 1.76 mg/dL — ABNORMAL HIGH
EGFR CKD-EPI (2021) MALE: 41 mL/min/{1.73_m2} — ABNORMAL LOW (ref >=60)
GLUCOSE RANDOM: 112 mg/dL — ABNORMAL HIGH (ref 70–99)
POTASSIUM: 4.6 mmol/L (ref 3.4–4.8)
PROTEIN TOTAL: 6.6 g/dL (ref 5.7–8.2)
SODIUM: 144 mmol/L (ref 135–145)

## 2022-01-27 LAB — TACROLIMUS LEVEL: TACROLIMUS BLOOD: 8.9 ng/mL

## 2022-01-27 NOTE — Unmapped (Signed)
Called Spencer Mallet Sr. to discuss lab results. Tacrolimus level was normal at 8.9 with a goal of 7-9. Medication regimen is currently 1.5/1. Dosing at this time to remain the same. Repeat labs scheduled monthly, pt has upcoming appt in early October. Resulted labs reviewed. All questions answered. Pt verbalized understanding.

## 2022-01-27 NOTE — Unmapped (Signed)
See note

## 2022-02-04 DIAGNOSIS — K219 Gastro-esophageal reflux disease without esophagitis: Principal | ICD-10-CM

## 2022-02-04 MED ORDER — MONTELUKAST 10 MG TABLET
ORAL_TABLET | Freq: Every day | ORAL | 2 refills | 30 days | Status: CP
Start: 2022-02-04 — End: 2023-02-04
  Filled 2022-02-09: qty 30, 30d supply, fill #0

## 2022-02-04 MED ORDER — PANTOPRAZOLE 40 MG TABLET,DELAYED RELEASE
ORAL_TABLET | Freq: Every day | ORAL | 3 refills | 90 days | Status: CP
Start: 2022-02-04 — End: ?
  Filled 2022-02-09: qty 90, 90d supply, fill #0

## 2022-02-07 DIAGNOSIS — Z942 Lung transplant status: Principal | ICD-10-CM

## 2022-02-07 DIAGNOSIS — D509 Iron deficiency anemia, unspecified: Principal | ICD-10-CM

## 2022-02-09 MED FILL — CALCIUM CARBONATE 600 MG CALCIUM (1,500 MG) TABLET: ORAL | 30 days supply | Qty: 60 | Fill #9

## 2022-02-09 MED FILL — AZATHIOPRINE 50 MG TABLET: ORAL | 90 days supply | Qty: 90 | Fill #1

## 2022-02-09 MED FILL — PRAVASTATIN 40 MG TABLET: ORAL | 30 days supply | Qty: 30 | Fill #4

## 2022-02-09 MED FILL — MAGNESIUM OXIDE 400 MG (241.3 MG MAGNESIUM) TABLET: ORAL | 30 days supply | Qty: 180 | Fill #4

## 2022-02-09 MED FILL — AZITHROMYCIN 250 MG TABLET: ORAL | 30 days supply | Qty: 30 | Fill #9

## 2022-02-10 DIAGNOSIS — Z942 Lung transplant status: Principal | ICD-10-CM

## 2022-02-18 MED ORDER — ACETAMINOPHEN 500 MG TABLET
ORAL_TABLET | Freq: Four times a day (QID) | ORAL | 0 refills | 23 days | Status: CP | PRN
Start: 2022-02-18 — End: 2022-03-20

## 2022-02-18 NOTE — Unmapped (Signed)
[facility] Refill Coordination Note    Specialty Medication(s) to be Shipped:   Transplant: valgancyclovir 450mg  and Prednisone(declined)    Other medication(s) to be shipped: acetaminophen 500 MG tablet     Spencer Mallet Sr., DOB: 05/02/1951  Phone: [phone] (home) [phone] (work)      All above HIPAA information was verified with patient.     Was a Nurse, learning disability used for this call? No    Completed refill call assessment today to schedule patient's medication shipment from the [facility] [phone]).  All relevant notes have been reviewed.     Specialty medication(s) and dose(s) confirmed: Regimen is correct and unchanged.   Changes to medications: Suhayb reports no changes at this time.  Changes to insurance: No  New side effects reported not previously addressed with a pharmacist or physician: None reported  Questions for the pharmacist: No    Confirmed patient received a Conservation officer, historic buildings and a Surveyor, mining with first shipment. The patient will receive a drug information handout for each medication shipped and additional FDA Medication Guides as required.       DISEASE/MEDICATION-SPECIFIC INFORMATION        N/A    SPECIALTY MEDICATION ADHERENCE     Medication Adherence    Patient reported X missed doses in the last month: 0  Specialty Medication: valganciclovir 450 mg  Patient is on additional specialty medications: Yes  Additional Specialty Medications: Prednisone 5 mg   Patient Reported Additional Medication X Missed Doses in the Last Month: 0  Patient is on more than two specialty medications: No                          Were doses missed due to medication being on hold? No    Valganciclovir 450 mg: 9 days of medicine on hand        REFERRAL TO PHARMACIST     Referral to the pharmacist: Not needed      [facility]     Shipping address confirmed in Epic.     Delivery Scheduled: Yes, Expected medication delivery date: 02/23/22.     Medication will be delivered via UPS to the prescription address in Epic WAM.    [name]   [facility] Specialty Technician

## 2022-02-18 NOTE — Unmapped (Signed)
The patient is requesting a medication refill

## 2022-02-19 ENCOUNTER — Ambulatory Visit: Admit: 2022-02-19 | Discharge: 2022-02-20 | Payer: MEDICARE

## 2022-02-19 ENCOUNTER — Ambulatory Visit: Admit: 2022-02-19 | Discharge: 2022-02-20 | Payer: MEDICARE | Attending: Internal Medicine | Primary: Internal Medicine

## 2022-02-19 ENCOUNTER — Encounter: Admit: 2022-02-19 | Discharge: 2022-02-20 | Payer: MEDICARE | Attending: Internal Medicine | Primary: Internal Medicine

## 2022-02-19 LAB — COMPREHENSIVE METABOLIC PANEL
ALBUMIN: 4.2 g/dL (ref 3.4–5.0)
ALKALINE PHOSPHATASE: 92 U/L (ref 46–116)
ALT (SGPT): 20 U/L (ref 10–49)
ANION GAP: 3 mmol/L — ABNORMAL LOW (ref 5–14)
AST (SGOT): 23 U/L (ref <=34)
BILIRUBIN TOTAL: 0.5 mg/dL (ref 0.3–1.2)
BLOOD UREA NITROGEN: 29 mg/dL — ABNORMAL HIGH (ref 9–23)
BUN / CREAT RATIO: 18
CALCIUM: 9.4 mg/dL (ref 8.7–10.4)
CHLORIDE: 107 mmol/L (ref 98–107)
CO2: 32 mmol/L — ABNORMAL HIGH (ref 20.0–31.0)
CREATININE: 1.58 mg/dL — ABNORMAL HIGH
EGFR CKD-EPI (2021) MALE: 46 mL/min/{1.73_m2} — ABNORMAL LOW (ref >=60)
GLUCOSE RANDOM: 103 mg/dL — ABNORMAL HIGH (ref 70–99)
POTASSIUM: 4.4 mmol/L (ref 3.4–4.8)
PROTEIN TOTAL: 6.8 g/dL (ref 5.7–8.2)
SODIUM: 142 mmol/L (ref 135–145)

## 2022-02-19 LAB — CBC W/ AUTO DIFF
BASOPHILS ABSOLUTE COUNT: 0 10*9/L (ref 0.0–0.1)
BASOPHILS RELATIVE PERCENT: 0.6 %
EOSINOPHILS ABSOLUTE COUNT: 0.1 10*9/L (ref 0.0–0.5)
EOSINOPHILS RELATIVE PERCENT: 1.3 %
HEMATOCRIT: 38.2 % — ABNORMAL LOW (ref 39.0–48.0)
HEMOGLOBIN: 12.8 g/dL — ABNORMAL LOW (ref 12.9–16.5)
LYMPHOCYTES ABSOLUTE COUNT: 0.5 10*9/L — ABNORMAL LOW (ref 1.1–3.6)
LYMPHOCYTES RELATIVE PERCENT: 13 %
MEAN CORPUSCULAR HEMOGLOBIN CONC: 33.6 g/dL (ref 32.0–36.0)
MEAN CORPUSCULAR HEMOGLOBIN: 31.9 pg (ref 25.9–32.4)
MEAN CORPUSCULAR VOLUME: 94.9 fL (ref 77.6–95.7)
MEAN PLATELET VOLUME: 7.5 fL (ref 6.8–10.7)
MONOCYTES ABSOLUTE COUNT: 0.3 10*9/L (ref 0.3–0.8)
MONOCYTES RELATIVE PERCENT: 7.8 %
NEUTROPHILS ABSOLUTE COUNT: 3.2 10*9/L (ref 1.8–7.8)
NEUTROPHILS RELATIVE PERCENT: 77.3 %
PLATELET COUNT: 123 10*9/L — ABNORMAL LOW (ref 150–450)
RED BLOOD CELL COUNT: 4.03 10*12/L — ABNORMAL LOW (ref 4.26–5.60)
RED CELL DISTRIBUTION WIDTH: 15.4 % — ABNORMAL HIGH (ref 12.2–15.2)
WBC ADJUSTED: 4.1 10*9/L (ref 3.6–11.2)

## 2022-02-19 LAB — HEMOGLOBIN A1C
ESTIMATED AVERAGE GLUCOSE: 131 mg/dL
HEMOGLOBIN A1C: 6.2 % — ABNORMAL HIGH (ref 4.8–5.6)

## 2022-02-19 LAB — PHOSPHORUS: PHOSPHORUS: 2.7 mg/dL (ref 2.4–5.1)

## 2022-02-19 LAB — MAGNESIUM: MAGNESIUM: 2 mg/dL (ref 1.6–2.6)

## 2022-02-19 LAB — IGG: GAMMAGLOBULIN; IGG: 707 mg/dL (ref 650–1600)

## 2022-02-19 LAB — TACROLIMUS LEVEL, TROUGH: TACROLIMUS, TROUGH: 6.8 ng/mL (ref 5.0–15.0)

## 2022-02-19 MED ORDER — FLUTICASONE PROPIONATE 50 MCG/ACTUATION NASAL SPRAY,SUSPENSION
Freq: Every day | NASAL | 11 refills | 60 days | Status: CP
Start: 2022-02-19 — End: 2023-02-19
  Filled 2022-03-18: qty 16, 30d supply, fill #0

## 2022-02-19 MED ORDER — ASPIRIN 81 MG TABLET,DELAYED RELEASE
ORAL_TABLET | Freq: Every day | ORAL | 3 refills | 90 days | Status: CP
Start: 2022-02-19 — End: 2023-02-19

## 2022-02-19 MED ORDER — FLUTICASONE PROPIONATE 115 MCG-SALMETEROL 21 MCG/ACTUATION HFA INHALER
Freq: Two times a day (BID) | RESPIRATORY_TRACT | 11 refills | 30 days | Status: CP
Start: 2022-02-19 — End: 2023-02-19

## 2022-02-19 MED ORDER — ATORVASTATIN 40 MG TABLET
ORAL_TABLET | Freq: Every day | ORAL | 3 refills | 90 days | Status: CP
Start: 2022-02-19 — End: 2023-02-19
  Filled 2022-03-18: qty 90, 90d supply, fill #0

## 2022-02-19 NOTE — Unmapped (Signed)
Pulmonary Transplant Clinic    HISTORY:   HPI:  Spencer Pena is a 71 y.o.  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   - He does endorse year-round sinus drainage. Uses Flonase occasionally. Has never used sinus rinses. No antihistamines. No known environmental allergies.  - Confirmed taking prednisone 7.5mg  daily, imuran 50mg  daily, tacrolimus 1.5/2  - On metformin and Januvia for his diabetes. Off insulin now with good glucose control.  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 He wears an N95. He is retired from [employer] from [employer].    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     --> basiliximab induction    --> CMV high risk (D+/R-)    --> EBV moderate risk (D+/R+)    --> Not Increased Risk Donor  - Colon Cancer 2007    --> s/p resection of liver mets    --> s/p resection of 6 inches of colon    --> FOLFOX Chemo  - type 2 diabetes mellitus     --> not insulin dependent  - OA    SOCIAL HISTORY:   - 20pk years, quit in 1991  - previously drank about 10 drinks per week   - Worked maintenance at [employer]  - Married and lives in [place]    FAMILY HISTORY:   - Parents lived into late 90's    MEDS: (personally reviewed in [place], pertinent meds noted below)     Current Outpatient Medications on File Prior to Visit   Medication Sig Dispense Refill    acetaminophen (TYLENOL EXTRA STRENGTH) 500 MG tablet Take 1-2 tablets (500-1,000 mg total) by mouth every six (6) hours as needed for pain. 180 tablet PRN    azaTHIOprine (IMURAN) 50 mg tablet Take 1 tablet (50 mg total) by mouth daily. 90 tablet 3 azithromycin (ZITHROMAX) 250 MG tablet Take 1 tablet (250 mg total) by mouth daily. 30 tablet 11    calcium carbonate (CALCIUM 600) 1,500 mg (600 mg elem calcium) tablet Take 1 tablet (600 mg of elem calcium total) by mouth Two (2) times a day. 60 tablet 11    cholecalciferol, vitamin D3 25 mcg, 1,000 units,, 1,000 unit (25 mcg) tablet Take 2 tablets (50 mcg total) by mouth daily. 100 tablet 11    magnesium oxide (MAG-OX) 400 mg (241.3 mg elemental magnesium) tablet Take 3 tablets (1,200 mg total) by mouth Two (2) times a day. 180 tablet 11    metFORMIN (GLUCOPHAGE) 1000 MG tablet Take 1 tablet (1,000 mg total) by mouth Two (2) times a day. 180 tablet 3    montelukast (SINGULAIR) 10 mg tablet Take 1 tablet (10 mg total) by mouth daily. 30 tablet 2    pantoprazole (PROTONIX) 40 MG tablet Take 1 tablet (40 mg total) by mouth daily. 90 tablet 3    predniSONE (DELTASONE) 5 MG tablet Take 1.5 tablets (7.5 mg total) by mouth daily. 135 tablet 3    SITagliptin phosphate (JANUVIA) 100 MG tablet Take 1 tablet (100 mg total) by mouth  daily. 90 tablet 3    sulfamethoxazole-trimethoprim (BACTRIM) 400-80 mg per tablet Take 1 tablet (80 mg of trimethoprim total) by mouth Every Monday, Wednesday, and Friday. 36 tablet 3    tacrolimus (PROGRAF) 0.5 MG capsule Take 3 capsules (1.5 mg total) by mouth daily AND 2 capsules (1 mg total) nightly. 450 capsule 3    terazosin (HYTRIN) 1 MG capsule Take 3 capsules (3 mg total) by mouth nightly. 90 capsule 11    valGANciclovir (VALCYTE) 450 mg tablet Take 1 tablet (450 mg total) by mouth daily. 30 tablet 11     No current facility-administered medications on file prior to visit.       OBJECTIVE DATA:   PHYSICAL EXAM:  BP 141/69 (BP Site: R Arm, BP Position: Sitting, BP Cuff Size: Medium)  - Pulse 82  - Temp 36.8 ??C (98.2 ??F) (Tympanic)  - Ht 170.2 cm (5' 7)  - Wt 76.7 kg (169 lb)  - SpO2 98%  - BMI 26.47 kg/m??      Gen: - awake, alert, in NAD  Derm: - bruising on BL arms  Vascular: - good pulses throughout  CV: - RRR, no m/r/g  Pulm: - CTAB, good air movement BL   Abd: - soft, NT, ND, no hepatosplenomegaly  Ext: - no edema of LE  Neuro: - Alert, oriented, follows commands. Moving all extremities spontaneously    LABS: (reviewed in Epic, pertinent values noted below)   PFTs: (personally reviewed and interpereted):    Date: FVC (% Pred) FEV1 (% Pred) FEF25-75(% Pred) DLCO TBBx/Results                   02/19/2022 4.03 (110.4) 3.52 (126.7) 5.19 (241.3)     11/17/21 4.13 (113%) 3.63 (130%)      08/18/21 4.01 (109%) 3.55 (127%)      05/15/2021 4.21(114%) 3.65 (130.3%) 5.54 (254%)     04/06/2021 4.08 (111%) 3.64 (129.6%) 6.01 (274.3%)     12/31/20 3.95 (107.1%) 3.46 (122.6%) 5.20 (236.4%)     12/03/2020 3.87 (104.9%) 3.47 (122.9%) 5.69 (258.3%)     10/03/2020 3.68 (99.4) 3.13 (110.6)  4.22 (190.6)      09/05/20  3.70  3.14      08/20/20 3.65 (98%) 3.15 (111%) 4.69 (211%)      08/01/20 3.64 (98.2%) 3.10 (109.3%) 4.41 (198.8%)     07/18/20 3.49 (94%) 2.98 (105%) 4.20 (189%)     07/04/20 3.43 (92.5%) 2.97 (104.7%) 4.26 (191.7%)     06/27/2020 3.13 84.5 2.70 95.4 3.51 157.8     06/20/20 3.39 (91.4%) 2.82 (99.2%) 3.34 (150.35)     06/13/20 3.10 (72.8%) 2.66 (82.5%) 3.34 (136.2%)      05/15/20      BOLT    03/11/20  2.75 (72%)  2.36 (81%)       02/12/20  2.85 (75%)  2.44 (86%)                IMAGING: Reviewed in EMR, CXR with broken sternal wires, otherwise stable    ASSESSMENT and PLAN     Spencer Pena is a 71 y.o. man with IPF and emphysema s/p BOLT on 05/15/2020, also with type 2 diabetes mellitus, hypertension, colon cancer in 2007 s/p partial colon and hepatic wedge resection and FOLFOX here for follow up    Graft Function and Immunosuppression:  - PFTs stable and excellent, CXR clear, clinically feeling well  - HLA status: No DSAs, checked monthly, last 09/2021  - Total  IgG <700 but insurance will not approve IVIG  - Immunosuppression:   - Change tac goal from goal 7-9 to goal 5-7. Currently taking 1.5/2.   - AZA 50 mg daily; dropped from 100 mg 08/01/20  -Stopped MMF 06/13/20 (GI upset)  - Prednisone 7.5mg  daily  - BOS PPX  - Azithromycin 250mg  daily  - ASA 81mg  daily  - Pravastatin 40mg  daily >> Atorvastatin 02/2022  - Advair BID - Currently nonadherent, encouraged adherence  - Montelukast 10mg  daily    Antimicrobial Prophylaxis:  - CMV, high risk (D+/R-): valganciclovir 450mg  daily indefinitely for Estimated Creatinine Clearance: 40.1 mL/min (A) (based on SCr of 1.58 mg/dL (H)). Negative VL 12/29/2021. Plan for repeat T cell assay at 2 years.  - EBV (D+/R+): negative 12/29/21  - Fungal: Posaconazole stopped at 39mo  - PCP: Bactrim SS MWF    Pancytopenia: Mild and stable over one year. Monitoring on current immunosuppressive regimen for now     Broken sternal wires  - noted on CXR in clinic 02/2022  - asymptomatic    Skin Bruising - start biotin and CoQ10. Likely related to prednisone and aspirin.    Pain control - previously required gabapentin, now only on PRN acetaminophen.    OSA   - STOP-BANG 4 points (male, age, observed apnea, snoring).  - PSG 12/29/2020   CPAP at level of 10 cm H2O with expiratory pressure relief (EPR) = 3 and with heated humidifier for nasal dryness, mask: AirFit N20 size medium or mask of patient???s preference.  The patient needs to follow up for CPAP compliance 31 to 90 days after initiation of CPAP therapy to ensure compliance of at least 4 hours per night for more than 70% of nights.  - Not using CPAP d/t discomfort. Talking to home health company.     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  - pH probe with DeMeester 16.7%, slightly in abnormal range but will hold off on surgical intervention at this time in the setting of normal graft function.  - Daily PPI     BPH - terazosin 3mg  at bedtime, continues with nocturia    CKD IIIa - monitoring    Iron Deficiency Anemia - received IV iron 05/22/20, ferritin 12.5 08/2021, continues to be low    Hypomagnesemia - magnesium oxide 1200 mg BID     Type 2 diabetes mellitus   - Lantus will drop to 12 units in the nightly with pred adjustment  - aspart 7/7/3 with pred adjustment  - off insulin now  - metformin 1000mg  BID  - Januvia  - sugars well controlled  - PVL ABI and carotid duplex normal 11/2021    Bone Health: Last Dexa: osteopenia on DEXA 11/17/2021  - on Ca and Vit D supplementation currently    Health Maintenance:  - Last derm visit:  12/2020  - Last Colonsocopy (if >40): 12/2021  - Last flu shot:   Given today 02/19/2022; advised 2nd dose in 6 weeks  - Last Pneumovax:  06/10/2004, 12/30/2017  - Last Prevnar:  03/25/2016  - TdaP:   02/11/2018  - Shingrix:    03/10/20, 08/20/20  - COVID-19 (Moderna) 06/11/19, 07/09/19, 03/10/20, 07/18/2020, 12/03/20 received 1/2 dose Evushield,03/2021 (Bivalent);  SpikeVax given today 02/19/2022  - Last Dexa:   02/2020, 11/17/21  - Last vitamin D:  66.9 05/2021  - Last HbA1c:   6.2 (02/2022)    Immunization History   Administered Date(s) Administered    COVID-19 VAC,BIVALENT(50YR UP),PFIZER 08/18/2021    COVID-19 VACCINE,MRNA(MODERNA)(PF) 06/11/2019, 07/09/2019, 03/10/2020, 07/18/2020, 12/03/2020    Covid-19 Vac, (89yr+) (Spikevax) Monovalent Xbb.1.5 Moder  02/19/2022    HEPATITIS B VACCINE ADULT, ADJUVANTED, IM(HEPLISAV B) 03/12/2020    INFLUENZA INJ MDCK PF, QUAD,(FLUCELVAX)(55MO AND UP EGG FREE) 02/08/2020    INFLUENZA TIV (TRI) PF (IM) 02/27/2010    Influenza Vaccine Quad (IIV4 PF) 5mo+ injectable 01/19/2019, 04/06/2021, 02/19/2022    Influenza Virus Vaccine, unspecified formulation 02/07/2013, 02/28/2015, 02/05/2016, 02/02/2017, 01/27/2018    PNEUMOCOCCAL POLYSACCHARIDE 23 06/10/2004, 12/30/2017    Pneumococcal Conjugate 13-Valent 03/25/2016    SHINGRIX-ZOSTER VACCINE (HZV), RECOMBINANT,SUB-UNIT,ADJUVANTED IM 03/10/2020, 08/20/2020    TdaP 02/15/2012, 02/11/2018     This patient was seen with Dr. Dudley Major who agrees with the plan    Pamalee Leyden, MD  Pulmonary and Critical Care Fellow

## 2022-02-19 NOTE — Unmapped (Signed)
[facility] CLINIC PHARMACY NOTE  02/19/2022   Spencer DAMM Sr.  [id number]    Medication changes today:   1. Stop pravastatin 40 mg daily and start atorvastatin 40 mg daily  2. Start Flonase 2 sprays into each nostril daily  3. Re-start Advair HFA BID  4. Adjusted Tac goal to 5-7    Education/Adherence tools provided today:  1. provided updated medication list    Follow up items:  1. Tac level pending  2. CMV viral load    Next visit with pharmacy in [place]  ____________________________________________________________________    Spencer Mallet Sr. is a 71 y.o. 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     Other PMH significant for hypertension, T2DM, colon cancer s/p hemicolectomy and R hepatectomy for metastasis + chemotherapy (2007), BPH    Seen by pharmacy today for: medication management and  pill box assessment and adherence education.     CC:  Patient has no complaints today      Vitals:    02/19/22 0937   BP: 141/69   Pulse: 82   Temp: 36.8 ??C (98.2 ??F)   SpO2: 98%       Allergies   Allergen Reactions    Cetuximab Anaphylaxis     Chest pain/rapid heart rate/ BP dropped    Nsaids (Non-Steroidal Anti-Inflammatory Drug)      Interaction with transplant medications    Venom-Honey Bee Anaphylaxis    Enalapril      Angioedema      Pollen Extracts Other (See Comments)     Nasal stuffiness, coughing, sneezing     Medications reviewed in EPIC medication station and updated today by the clinical pharmacist practitioner. Medication list includes revisions made during today???s encounter.    Outpatient Encounter Medications as of 02/19/2022   Medication Sig Dispense Refill    acetaminophen (TYLENOL EXTRA STRENGTH) 500 MG tablet Take 1-2 tablets (500-1,000 mg total) by mouth every six (6) hours as needed for pain. 180 tablet PRN    aspirin (ECOTRIN) 81 MG tablet Take 1 tablet (81 mg total) by mouth daily. 90 tablet 3    atorvastatin (LIPITOR) 40 MG tablet Take 1 tablet (40 mg total) by mouth daily. 90 tablet 3    azaTHIOprine (IMURAN) 50 mg tablet Take 1 tablet (50 mg total) by mouth daily. 90 tablet 3    azithromycin (ZITHROMAX) 250 MG tablet Take 1 tablet (250 mg total) by mouth daily. 30 tablet 11    calcium carbonate (CALCIUM 600) 1,500 mg (600 mg elem calcium) tablet Take 1 tablet (600 mg of elem calcium total) by mouth Two (2) times a day. 60 tablet 11    cholecalciferol, vitamin D3 25 mcg, 1,000 units,, 1,000 unit (25 mcg) tablet Take 2 tablets (50 mcg total) by mouth daily. 100 tablet 11    fluticasone propion-salmeteroL (ADVAIR HFA) 115-21 mcg/actuation inhaler Inhale 2 puffs Two (2) times a day. 12 g 11    fluticasone propionate (FLONASE) 50 mcg/actuation nasal spray 2 sprays into each nostril daily. 16 g 11    magnesium oxide (MAG-OX) 400 mg (241.3 mg elemental magnesium) tablet Take 3 tablets (1,200 mg total) by mouth Two (2) times a day. 180 tablet 11    metFORMIN (GLUCOPHAGE) 1000 MG tablet Take 1 tablet (1,000 mg total) by mouth Two (2) times a day. 180 tablet 3    montelukast (SINGULAIR) 10 mg tablet Take 1 tablet (10 mg total) by mouth daily.  30 tablet 2    pantoprazole (PROTONIX) 40 MG tablet Take 1 tablet (40 mg total) by mouth daily. 90 tablet 3    predniSONE (DELTASONE) 5 MG tablet Take 1.5 tablets (7.5 mg total) by mouth daily. 135 tablet 3    SITagliptin phosphate (JANUVIA) 100 MG tablet Take 1 tablet (100 mg total) by mouth daily. 90 tablet 3    sulfamethoxazole-trimethoprim (BACTRIM) 400-80 mg per tablet Take 1 tablet (80 mg of trimethoprim total) by mouth Every Monday, Wednesday, and Friday. 36 tablet 3    tacrolimus (PROGRAF) 0.5 MG capsule Take 3 capsules (1.5 mg total) by mouth daily AND 2 capsules (1 mg total) nightly. 450 capsule 3    terazosin (HYTRIN) 1 MG capsule Take 3 capsules (3 mg total) by mouth nightly. 90 capsule 11    valGANciclovir (VALCYTE) 450 mg tablet Take 1 tablet (450 mg total) by mouth daily. 30 tablet 11    [DISCONTINUED] acetaminophen (TYLENOL EXTRA STRENGTH) 500 MG tablet Take 1-2 tablets (500-1,000 mg total) by mouth every six (6) hours as needed for pain. 180 tablet 0    [DISCONTINUED] aspirin (ECOTRIN) 81 MG tablet Take 1 tablet (81 mg total) by mouth daily. 90 tablet 3    [DISCONTINUED] blood sugar diagnostic (ON CALL EXPRESS TEST STRIP) Strp Test daily before all meals/snacks and once before bedtime. 100 each 11    [DISCONTINUED] fluticasone propion-salmeteroL (ADVAIR HFA) 115-21 mcg/actuation inhaler Inhale 2 puffs Two (2) times a day. 12 g 11    [DISCONTINUED] lancets 30 gauge Misc Test daily before all meals/snacks and once before bedtime. 100 each 11    [DISCONTINUED] pen needle, diabetic (BD ULTRA-FINE NANO PEN NEEDLE) 32 gauge x 5/32 (4 mm) Ndle Use as directed for injections five (5) times a day. 150 each 11    [DISCONTINUED] pravastatin (PRAVACHOL) 40 MG tablet Take 1 tablet (40 mg total) by mouth daily. 30 tablet 11     No facility-administered encounter medications on file as of 02/19/2022.       Immunosuppression:  Induction agent: basiliximab    Current immunosuppression:  tacrolimus 1.5 mg AM/1 mg PM  Prograf goal:  7-9  Azathioprine 50 mg daily    changed from MMF 2/2 diarrhea while inpatient; dose decreased from 100mg  to 75mg  on 08/01/20 given downtrending ANC and IgG; dose decrease to 50mg  on 10/03/20  prednisone 7.5 mg daily    Patient is tolerating immunosuppression well     IMMUNOSUPPRESSION DRUG LEVELS:  Lab Results   Component Value Date    Tacrolimus, Trough 6.8 02/19/2022    Tacrolimus, Trough 9.9 12/29/2021    Tacrolimus, Trough 6.5 12/01/2021    Tacrolimus, Timed 8.9 01/27/2022    Tacrolimus, Timed 9.3 01/12/2022     Tacrolimus level is accurate 12 hour trough.    Graft function assessment via PFTs: stable  DSA:  NTD  Biopsies to date: none  WBC/ANC:  wnl      Plan: Will maintain current immunosuppression. Reducing Tac goal from 7-9 to 5-7 given CKD, no signs of rejection, and tolerating azathioprine 50 mg for over one year without leukopenia.    OI prophylaxis:   CMV Status: D+/ R-, high risk. CMV prophylaxis with valganciclovir 450 mg daily x 12 months per protocol. Completed T-cell immunity panel with no interferon gamma production for CD4 cells.   Estimated Creatinine Clearance: 40.1 mL/min (A) (based on SCr of 1.58 mg/dL (H)).  PCP: Prophylaxis with bactrim SS 1 tab MWF indefinitely.  Fungal: Completed posaconazole  300 mg daily for prophylaxis x 3 months per protocol - COMPLETE (08/20/20)    Patient is tolerating infectious prophylaxis well.    Plan:  Repeat CMV T-cell immunity panel in one year and continue Valcyte ppx given high risk status.    Infectious History:  Patient does not have a history of pathogenic infectious complications following lung transplant. 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 Previous antiinfective history use includes meropenem x7 days for broad spectrum gram negative and aspiration PNA coverage post-op per ICID.     Plan: Continue to monitor.    BOS/CLAD prophylaxis:  Current meds include: azithromycin 250 mg daily, motelukast 10 mg, and pantoprazole 40 mg daily  Esophageal motility study on 07/23/20 showed abnormal distal esophagus exposure during recumbent posture and overall borderline normal. 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 Patient denies any reflux symptoms with every day dosing.     Patient reported not using Advair HFA for several months as he did not feel he needed it.     Plan: Re-start Advair HFA for CLAD prophylaxis and continue all other current therapy.    CV Prophylaxis:   Aspirin: asa 81 mg daily  The 10-year ASCVD risk score (Arnett DK, et al., 2019) is: 33.5%  Statin: pravastatin; currently on 40 mg daily - given high risk for ASCVD and history of diabetes will transition to high intensity statin.     Plan: Stop pravastatin and start atorvastatin 40 mg daily    BP/Edema: Goal <140/90.   Encounter vitals reported above.  Home BP ranges: 140s/60s mmHg  Weights: stable  Current meds include: terazosin 3mg  at bedtime for BPH    Plan: BP and BPH within goal. Continue. Patient advised to reach out to care coordinator if BP continue to rise into the 150 systolic range.    Anemia:  H/H:   Lab Results   Component Value Date    HGB 12.8 (L) 02/19/2022     Lab Results   Component Value Date    HCT 38.2 (L) 02/19/2022     Iron panel:  Lab Results   Component Value Date    IRON 38 (L) 09/01/2021    TIBC 349.0 09/01/2021    FERRITIN 12.5 09/01/2021     Lab Results   Component Value Date    Iron Saturation (%) 11 09/01/2021    Iron Saturation (%) 17 08/01/2020     Prior anemia therapy: IV Ferrlecit (1/10-1/13)    Plan: H/H within goal       DM:   Lab Results   Component Value Date    A1C 6.2 (H) 02/19/2022   Goal A1c < 7%.  Current meds include:  Metformin 1000 mg PO BID  Januvia 100 mg PO daily    Home BS log: [place] within goal consistently, patient remains controlled on oral therapy.  Exercise: walking daily up to 2 miles a day  Hypoglycemia: no  Plan:  continue current reigmen    Electrolytes: wnl  Current meds include: MgOx 1200 mg BID    Plan: continue current therapy.     Bone health:  Patient currently on long-term steroid therapy.  Vitamin D Level:  66.9 (1/23) . Goal > 30.   Last DEXA results (11/17/21): osteopenia  Current meds include: vitamin D3 2000 units daily, calcium carbonate 600 mg BID    Plan: Continue current therapy. Continue to monitor.    Women's/Men's Health:  Spencer BROOKMAN Sr. is a  71 y.o. male. Patient reports  history of BPH . Reports that BPH symptoms are controlled.  Current meds include: terazosin 3 mg PO nightly (increased 09/05/20 given increased BPH symptoms)     Plan: Continue current dose and BP monitoring.     Adherence:   Patient has good understanding of medications.  Patient does fill their own pill box on a regular basis at home. Wife had previously been filing for him, but he now handles it on his own for the most part.   Patient brought medication card: yes  Pill box: did not bring  Patient requested refills for the following meds: None  Corrections needed in Epic medication list: none    Plan: Provided basic adherence counseling/intervention    I spent a total of 20 minutes face to face with the patient delivering clinical care and providing education/counseling.    Patient was reviewed with Dr. Dudley Major who was agreement with the stated plan.    During this visit, the following was completed:   BG log data assessment  BP log data assessment  Labs ordered and evaluated  complex treatment plan >1 DS   Patient education was completed for 11-24 minutes     All questions/concerns were addressed to the patient's satisfaction.  __________________________________________  PATIENT SEEN AND EVALUATED BY:    Beryle Lathe, PharmD  PGY1 Ambulatory Care Pharmacy Resident     Sione Baumgarten TEETER Stoy Fenn, PHARMD, BCPS, CPP  [employer]ID [employer] TRANSPLANT PHARMACIST PRACTITIONER  PAGER [phone]

## 2022-02-19 NOTE — Unmapped (Signed)
Called Spencer Mallet Sr. to discuss lab results. Tacrolimus level was normal at 6.8 with a new goal of 5-7. Medication regimen is currently 1.5/1. Dosing at this time to remain the same. Repeat labs scheduled one month. Resulted labs reviewed. All questions answered. Pt verbalized understanding.

## 2022-02-19 NOTE — Unmapped (Signed)
Per provider, the patient received Influenza 62mo+vaccine in right deltoid AND COVID-19 15YR+  Spikevax  vaccine in Left deltoid Patient ID verified with name and date of birth.  All screening questions were answered.  Vaccine(s) were administered as ordered.  See immunization history for documentation.  Patient tolerated the injection(s) well with no issues noted.  Vaccine Information sheet given to the patient.

## 2022-02-20 LAB — CMV DNA, QUANTITATIVE, PCR: CMV VIRAL LD: NOT DETECTED

## 2022-02-22 LAB — EBV QUANTITATIVE PCR, BLOOD: EBV VIRAL LOAD RESULT: NOT DETECTED

## 2022-02-22 MED FILL — VALGANCICLOVIR 450 MG TABLET: ORAL | 30 days supply | Qty: 30 | Fill #7

## 2022-02-27 LAB — HLA DS POST TRANSPLANT
ANTI-DONOR DRW #1 MFI: 2 MFI
ANTI-DONOR DRW #2 MFI: 2 MFI
ANTI-DONOR HLA-A #1 MFI: 13 MFI
ANTI-DONOR HLA-A #2 MFI: 4 MFI
ANTI-DONOR HLA-B #1 MFI: 0 MFI
ANTI-DONOR HLA-B #2 MFI: 0 MFI
ANTI-DONOR HLA-C #2 MFI: 0 MFI
ANTI-DONOR HLA-DP AG #1 MFI: 7 MFI
ANTI-DONOR HLA-DQB #1 MFI: 0 MFI
ANTI-DONOR HLA-DQB #2 MFI: 1 MFI
ANTI-DONOR HLA-DR #1 MFI: 31 MFI
ANTI-DONOR HLA-DR #2 MFI: 21 MFI

## 2022-02-27 LAB — FSAB CLASS 2 ANTIBODY SPECIFICITY: HLA CL2 AB RESULT: POSITIVE

## 2022-02-27 LAB — FSAB CLASS 1 ANTIBODY SPECIFICITY: HLA CLASS 1 ANTIBODY RESULT: NEGATIVE

## 2022-03-05 MED ORDER — ACETAMINOPHEN 500 MG TABLET
ORAL_TABLET | Freq: Four times a day (QID) | ORAL | PRN refills | 23 days | Status: CP | PRN
Start: 2022-03-05 — End: ?
  Filled 2022-03-09: qty 180, 23d supply, fill #0

## 2022-03-05 MED ORDER — PRAVASTATIN 40 MG TABLET
ORAL_TABLET | Freq: Every day | ORAL | 11 refills | 30 days
Start: 2022-03-05 — End: ?

## 2022-03-05 NOTE — Unmapped (Signed)
Pt request for RX Refill

## 2022-03-05 NOTE — Unmapped (Signed)
Pt is on atorvastatin.

## 2022-03-09 MED FILL — AZITHROMYCIN 250 MG TABLET: ORAL | 30 days supply | Qty: 30 | Fill #10

## 2022-03-09 MED FILL — PREDNISONE 5 MG TABLET: ORAL | 90 days supply | Qty: 135 | Fill #2

## 2022-03-09 MED FILL — MAGNESIUM OXIDE 400 MG (241.3 MG MAGNESIUM) TABLET: ORAL | 30 days supply | Qty: 180 | Fill #5

## 2022-03-09 MED FILL — MONTELUKAST 10 MG TABLET: ORAL | 30 days supply | Qty: 30 | Fill #1

## 2022-03-09 MED FILL — TERAZOSIN 1 MG CAPSULE: ORAL | 30 days supply | Qty: 90 | Fill #6

## 2022-03-09 MED FILL — CALCIUM CARBONATE 600 MG CALCIUM (1,500 MG) TABLET: ORAL | 30 days supply | Qty: 60 | Fill #10

## 2022-03-14 DIAGNOSIS — Z942 Lung transplant status: Principal | ICD-10-CM

## 2022-03-14 DIAGNOSIS — D509 Iron deficiency anemia, unspecified: Principal | ICD-10-CM

## 2022-03-17 NOTE — Unmapped (Signed)
[facility] Refill Coordination Note    Specialty Medication(s) to be Shipped:   Transplant: valgancyclovir 450mg     Other medication(s) to be shipped:     Vitamin D3  Atorvastatin  Asa  Flonase  Advair       Spencer Mallet Sr., DOB: 08-Nov-1950  Phone: [phone] (home) [phone] (work)      All above HIPAA information was verified with patient.     Was a Nurse, learning disability used for this call? No    Completed refill call assessment today to schedule patient's medication shipment from the [facility] [phone]).  All relevant notes have been reviewed.     Specialty medication(s) and dose(s) confirmed: Regimen is correct and unchanged.   Changes to medications: [name] reports no changes at this time.  Changes to insurance: No  New side effects reported not previously addressed with a pharmacist or physician: None reported  Questions for the pharmacist: No    Confirmed patient received a Conservation officer, historic buildings and a Surveyor, mining with first shipment. The patient will receive a drug information handout for each medication shipped and additional FDA Medication Guides as required.       DISEASE/MEDICATION-SPECIFIC INFORMATION        N/A    SPECIALTY MEDICATION ADHERENCE     Medication Adherence    Patient reported X missed doses in the last month: 0  Specialty Medication: valganciclovir 450 mg  Patient is on additional specialty medications: Yes  Additional Specialty Medications: Prednisone 5 mg   Patient Reported Additional Medication X Missed Doses in the Last Month: 0  Patient is on more than two specialty medications: No                          valgancyclovir 450mg   8 days worth of medication on hand.        Were doses missed due to medication being on hold? No      REFERRAL TO PHARMACIST     Referral to the pharmacist: Not needed      [facility]     Shipping address confirmed in Epic.     Delivery Scheduled: Yes, Expected medication delivery date: 03/19/22.     Medication will be delivered via UPS to the prescription address in Epic WAM.    Spencer Pena   [facility] [address] [facility] Specialty Technician

## 2022-03-18 MED FILL — VALGANCICLOVIR 450 MG TABLET: ORAL | 30 days supply | Qty: 30 | Fill #8

## 2022-03-18 MED FILL — CHOLECALCIFEROL (VITAMIN D3) 25 MCG (1,000 UNIT) TABLET: ORAL | 50 days supply | Qty: 100 | Fill #5

## 2022-03-18 MED FILL — ADVAIR HFA 115 MCG-21 MCG/ACTUATION AEROSOL INHALER: RESPIRATORY_TRACT | 30 days supply | Qty: 12 | Fill #0

## 2022-03-18 MED FILL — ASPIRIN 81 MG TABLET,DELAYED RELEASE: ORAL | 90 days supply | Qty: 90 | Fill #0

## 2022-03-30 ENCOUNTER — Encounter: Admit: 2022-03-30 | Discharge: 2022-03-30 | Payer: MEDICARE | Attending: Internal Medicine | Primary: Internal Medicine

## 2022-03-30 ENCOUNTER — Ambulatory Visit: Admit: 2022-03-30 | Discharge: 2022-03-30 | Payer: MEDICARE

## 2022-03-30 DIAGNOSIS — Z942 Lung transplant status: Principal | ICD-10-CM

## 2022-03-30 LAB — CMV DNA, QUANTITATIVE, PCR: CMV VIRAL LD: NOT DETECTED

## 2022-03-30 LAB — TACROLIMUS LEVEL, TROUGH: TACROLIMUS, TROUGH: 12.4 ng/mL (ref 5.0–15.0)

## 2022-03-30 LAB — CBC W/ AUTO DIFF
BASOPHILS ABSOLUTE COUNT: 0 10*9/L (ref 0.0–0.1)
BASOPHILS RELATIVE PERCENT: 0.4 %
EOSINOPHILS ABSOLUTE COUNT: 0 10*9/L (ref 0.0–0.5)
EOSINOPHILS RELATIVE PERCENT: 1.3 %
HEMATOCRIT: 39.6 % (ref 39.0–48.0)
HEMOGLOBIN: 13.8 g/dL (ref 12.9–16.5)
LYMPHOCYTES ABSOLUTE COUNT: 0.5 10*9/L — ABNORMAL LOW (ref 1.1–3.6)
LYMPHOCYTES RELATIVE PERCENT: 15.1 %
MEAN CORPUSCULAR HEMOGLOBIN CONC: 34.8 g/dL (ref 32.0–36.0)
MEAN CORPUSCULAR HEMOGLOBIN: 32.8 pg — ABNORMAL HIGH (ref 25.9–32.4)
MEAN CORPUSCULAR VOLUME: 94.4 fL (ref 77.6–95.7)
MEAN PLATELET VOLUME: 7.5 fL (ref 6.8–10.7)
MONOCYTES ABSOLUTE COUNT: 0.3 10*9/L (ref 0.3–0.8)
MONOCYTES RELATIVE PERCENT: 7.5 %
NEUTROPHILS ABSOLUTE COUNT: 2.7 10*9/L (ref 1.8–7.8)
NEUTROPHILS RELATIVE PERCENT: 75.7 %
PLATELET COUNT: 135 10*9/L — ABNORMAL LOW (ref 150–450)
RED BLOOD CELL COUNT: 4.2 10*12/L — ABNORMAL LOW (ref 4.26–5.60)
RED CELL DISTRIBUTION WIDTH: 15.6 % — ABNORMAL HIGH (ref 12.2–15.2)
WBC ADJUSTED: 3.6 10*9/L (ref 3.6–11.2)

## 2022-03-30 LAB — COMPREHENSIVE METABOLIC PANEL
ALBUMIN: 4.4 g/dL (ref 3.4–5.0)
ALKALINE PHOSPHATASE: 88 U/L (ref 46–116)
ALT (SGPT): 28 U/L (ref 10–49)
ANION GAP: 8 mmol/L (ref 5–14)
AST (SGOT): 31 U/L (ref <=34)
BILIRUBIN TOTAL: 0.6 mg/dL (ref 0.3–1.2)
BLOOD UREA NITROGEN: 38 mg/dL — ABNORMAL HIGH (ref 9–23)
BUN / CREAT RATIO: 19
CALCIUM: 10 mg/dL (ref 8.7–10.4)
CHLORIDE: 107 mmol/L (ref 98–107)
CO2: 29 mmol/L (ref 20.0–31.0)
CREATININE: 1.99 mg/dL — ABNORMAL HIGH
EGFR CKD-EPI (2021) MALE: 35 mL/min/{1.73_m2} — ABNORMAL LOW (ref >=60)
GLUCOSE RANDOM: 115 mg/dL — ABNORMAL HIGH (ref 70–99)
POTASSIUM: 4.4 mmol/L (ref 3.4–4.8)
PROTEIN TOTAL: 7 g/dL (ref 5.7–8.2)
SODIUM: 144 mmol/L (ref 135–145)

## 2022-03-30 LAB — IGG: GAMMAGLOBULIN; IGG: 761 mg/dL (ref 650–1600)

## 2022-03-30 LAB — PHOSPHORUS: PHOSPHORUS: 2.9 mg/dL (ref 2.4–5.1)

## 2022-03-30 LAB — MAGNESIUM: MAGNESIUM: 1.9 mg/dL (ref 1.6–2.6)

## 2022-03-30 MED ORDER — TACROLIMUS 0.5 MG CAPSULE, IMMEDIATE-RELEASE
ORAL_CAPSULE | Freq: Two times a day (BID) | ORAL | 3 refills | 90 days | Status: CP
Start: 2022-03-30 — End: 2023-03-30

## 2022-03-31 LAB — EBV QUANTITATIVE PCR, BLOOD: EBV VIRAL LOAD RESULT: NOT DETECTED

## 2022-03-31 NOTE — Unmapped (Addendum)
[facility] Clinical Assessment & Refill Coordination Note    Spencer NANNY Sr., DOB: December 06, 1950  Phone: [phone] (home) [phone] (work)    All above HIPAA information was verified with patient.     Was a Nurse, learning disability used for this call? No    Specialty Medication(s):   Transplant: tacrolimus 0.5mg , valgancyclovir 450mg , Prednisone 5mg  and azathioprine 50mg      Current Outpatient Medications   Medication Sig Dispense Refill   ??? acetaminophen (TYLENOL EXTRA STRENGTH) 500 MG tablet Take 1-2 tablets (500-1,000 mg total) by mouth every six (6) hours as needed for pain. 180 tablet PRN   ??? aspirin (ECOTRIN) 81 MG tablet Take 1 tablet (81 mg total) by mouth daily. 90 tablet 3   ??? atorvastatin (LIPITOR) 40 MG tablet Take 1 tablet (40 mg total) by mouth daily. 90 tablet 3   ??? azaTHIOprine (IMURAN) 50 mg tablet Take 1 tablet (50 mg total) by mouth daily. 90 tablet 3   ??? azithromycin (ZITHROMAX) 250 MG tablet Take 1 tablet (250 mg total) by mouth daily. 30 tablet 11   ??? calcium carbonate (CALCIUM 600) 1,500 mg (600 mg elem calcium) tablet Take 1 tablet (600 mg of elem calcium total) by mouth Two (2) times a day. 60 tablet 11   ??? cholecalciferol, vitamin D3 25 mcg, 1,000 units,, 1,000 unit (25 mcg) tablet Take 2 tablets (50 mcg total) by mouth daily. 100 tablet 11   ??? fluticasone propion-salmeteroL (ADVAIR HFA) 115-21 mcg/actuation inhaler Inhale 2 puffs Two (2) times a day. 12 g 11   ??? fluticasone propionate (FLONASE) 50 mcg/actuation nasal spray Use 2 sprays into each nostril daily. 16 g 11   ??? magnesium oxide (MAG-OX) 400 mg (241.3 mg elemental magnesium) tablet Take 3 tablets (1,200 mg total) by mouth Two (2) times a day. 180 tablet 11   ??? metFORMIN (GLUCOPHAGE) 1000 MG tablet Take 1 tablet (1,000 mg total) by mouth Two (2) times a day. 180 tablet 3   ??? montelukast (SINGULAIR) 10 mg tablet Take 1 tablet (10 mg total) by mouth daily. 30 tablet 2   ??? pantoprazole (PROTONIX) 40 MG tablet Take 1 tablet (40 mg total) by mouth daily. 90 tablet 3   ??? predniSONE (DELTASONE) 5 MG tablet Take 1.5 tablets (7.5 mg total) by mouth daily. 135 tablet 3   ??? SITagliptin phosphate (JANUVIA) 100 MG tablet Take 1 tablet (100 mg total) by mouth daily. 90 tablet 3   ??? sulfamethoxazole-trimethoprim (BACTRIM) 400-80 mg per tablet Take 1 tablet (80 mg of trimethoprim total) by mouth Every Monday, Wednesday, and Friday. 36 tablet 3   ??? tacrolimus (PROGRAF) 0.5 MG capsule Take 2 capsules (1 mg total) by mouth two (2) times a day. 360 capsule 3   ??? terazosin (HYTRIN) 1 MG capsule Take 3 capsules (3 mg total) by mouth nightly. 90 capsule 11   ??? valGANciclovir (VALCYTE) 450 mg tablet Take 1 tablet (450 mg total) by mouth daily. 30 tablet 11     No current facility-administered medications for this visit.        Changes to medications: Osbaldo reports no changes at this time.    Allergies   Allergen Reactions   ??? Cetuximab Anaphylaxis     Chest pain/rapid heart rate/ BP dropped   ??? Nsaids (Non-Steroidal Anti-Inflammatory Drug)      Interaction with transplant medications   ??? Venom-Honey Bee Anaphylaxis   ??? Enalapril      Angioedema     ???  Pollen Extracts Other (See Comments)     Nasal stuffiness, coughing, sneezing       Changes to allergies: No    SPECIALTY MEDICATION ADHERENCE     Tacrolimus 0.5mg   : 30 days of medicine on hand   Azathioprine 50mg   : 50 days of medicine on hand   valganciclovir 450mg  : 25 days of medicine on hand   Prednisone 5mg   : 75 days of medicine on hand       Medication Adherence    Patient reported X missed doses in the last month: 0  Specialty Medication: tacrolimus 0.5mg   Patient is on additional specialty medications: Yes  Additional Specialty Medications: Valganciclovir 450mg   Patient Reported Additional Medication X Missed Doses in the Last Month: 0  Patient is on more than two specialty medications: Yes  Specialty Medication: azathioprine 50mg   Patient Reported Additional Medication X Missed Doses in the Last Month: 0  Specialty Medication: prednisone 5mg   Patient Reported Additional Medication X Missed Doses in the Last Month: 0                      Specialty medication(s) dose(s) confirmed: Patient reports changes to the regimen as follows: tac is now 1mg  bid     Are there any concerns with adherence? No    Adherence counseling provided? Not needed    CLINICAL MANAGEMENT AND INTERVENTION      Clinical Benefit Assessment:    Do you feel the medicine is effective or helping your condition? Yes    Clinical Benefit counseling provided? Not needed    Adverse Effects Assessment:    Are you experiencing any side effects? No    Are you experiencing difficulty administering your medicine? No    Quality of Life Assessment:    Quality of Life    Rheumatology  Oncology  Dermatology  Cystic Fibrosis          How many days over the past month did your transplant  keep you from your normal activities? For example, brushing your teeth or getting up in the morning. 0    Have you discussed this with your provider? Not needed    Acute Infection Status:    Acute infections noted within Epic:  No active infections  Patient reported infection: None    Therapy Appropriateness:    Is therapy appropriate and patient progressing towards therapeutic goals? Yes, therapy is appropriate and should be continued    DISEASE/MEDICATION-SPECIFIC INFORMATION      N/A    Solid Organ Transplant: Not Applicable    PATIENT SPECIFIC NEEDS     - Does the patient have any physical, cognitive, or cultural barriers? No    - Is the patient high risk? No    - Did the patient require a clinical intervention? No    - Does the patient require physician intervention or other additional services (i.e., nutrition, smoking cessation, social work)? No    SOCIAL DETERMINANTS OF HEALTH     At the [facility], we have learned that life circumstances - like trouble affording food, housing, utilities, or transportation can affect the health of many of our patients.   That is why we wanted to ask: are you currently experiencing any life circumstances that are negatively impacting your health and/or quality of life? No    Social Determinants of Health     Financial Resource Strain: Low Risk  (08/18/2021)    Overall Financial Resource Strain (CARDIA)    ???  Difficulty of Paying Living Expenses: Not hard at all   Internet Connectivity: Internet connectivity concern identified (08/18/2021)    Internet Connectivity    ??? Do you have access to internet services: No    ??? How do you connect to the internet: Not on file    ??? Is your internet connection strong enough for you to watch video on your device without major problems?: Not on file    ??? Do you have enough data to get through the month?: Not on file    ??? Does at least one of the devices have a camera that you can use for video chat?: Not on file   Food Insecurity: No Food Insecurity (08/18/2021)    Hunger Vital Sign    ??? Worried About Running Out of Food in the Last Year: Never true    ??? Ran Out of Food in the Last Year: Never true   Tobacco Use: Medium Risk (02/19/2022)    Patient History    ??? Smoking Tobacco Use: Former    ??? Smokeless Tobacco Use: Former    ??? Passive Exposure: Never   Housing/Utilities: Low Risk  (08/18/2021)    Housing/Utilities    ??? Within the past 12 months, have you ever stayed: outside, in a car, in a tent, in an overnight shelter, or temporarily in someone else's home (i.e. couch-surfing)?: No    ??? Are you worried about losing your housing?: No    ??? Within the past 12 months, have you been unable to get utilities (heat, electricity) when it was really needed?: No   Alcohol Use: Not At Risk (08/18/2021)    Alcohol Use    ??? How often do you have a drink containing alcohol?: 2 - 3 times per week    ??? How many drinks containing alcohol do you have on a typical day when you are drinking?: 1 - 2    ??? How often do you have 5 or more drinks on one occasion?: Never   Transportation Needs: No Transportation Needs (08/18/2021)    PRAPARE - Transportation    ??? Lack of Transportation (Medical): No    ??? Lack of Transportation (Non-Medical): No   Substance Use: Low Risk  (08/18/2021)    Substance Use    ??? Taken prescription drugs for non-medical reasons: Never    ??? Taken illegal drugs: Never    ??? Patient indicated they have taken drugs in the past year for non-medical reasons: Yes, [positive answer(s)]: Not on file   Health Literacy: Medium Risk (08/18/2021)    Health Literacy    ??? : Rarely   Physical Activity: Sufficiently Active (08/18/2021)    Exercise Vital Sign    ??? Days of Exercise per Week: 7 days    ??? Minutes of Exercise per Session: 60 min   Interpersonal Safety: Not at risk (08/18/2021)    Interpersonal Safety    ??? Unsafe Where You Currently Live: No    ??? Physically Hurt by Anyone: No    ??? Abused by Anyone: No   Stress: No Stress Concern Present (08/18/2021)    [employer] of Occupational Health - Occupational Stress Questionnaire    ??? Feeling of Stress : Not at all   Intimate Partner Violence: Not At Risk (08/18/2021)    Humiliation, Afraid, Rape, and Kick questionnaire    ??? Fear of Current or Ex-Partner: No    ??? Emotionally Abused: No    ??? Physically Abused: No    ???  Sexually Abused: No   Depression: Not at risk (08/18/2021)    PHQ-2    ??? PHQ-2 Score: 0   Social Connections: Moderately Isolated (08/18/2021)    Social Connection and Isolation Panel [NHANES]    ??? Frequency of Communication with Friends and Family: More than three times a week    ??? Frequency of Social Gatherings with Friends and Family: Once a week    ??? Attends Religious Services: Never    ??? Active Member of Clubs or Organizations: No    ??? Attends Banker Meetings: Never    ??? Marital Status: Married       Would you be willing to receive help with any of the needs that you have identified today? Not applicable       SHIPPING     Specialty Medication(s) to be Shipped:   n/a    Other medication(s) to be shipped: No additional medications requested for fill at this time     Changes to insurance: No    Delivery Scheduled: Patient declined refill at this time due to supply on hand. pt wants call back in 2 weeks.     Medication will be delivered via n/a to the confirmed n/a address in [facility].    The patient will receive a drug information handout for each medication shipped and additional FDA Medication Guides as required.  Verified that patient has previously received a Conservation officer, historic buildings and a Surveyor, mining.    The patient or caregiver noted above participated in the development of this care plan and knows that they can request review of or adjustments to the care plan at any time.      All of the patient's questions and concerns have been addressed.    Thad Ranger, PharmD   [facility] Specialty Pharmacist

## 2022-03-31 NOTE — Unmapped (Signed)
[facility] Specialty Medication Onboarding    Specialty Medication: Tacrolimus 0.5mg  capsule  Prior Authorization: Not Required   Financial Assistance: No - copay  <$25  Final Copay/Day Supply: $24 / 90 days    Insurance Restrictions: None     Notes to Pharmacist: Previously other insurance prevented patient from filling this medication with [facility], appears he now has different insurance.    The triage team has completed the benefits investigation and has determined that the patient is able to fill this medication at [facility]. Please contact the patient to complete the onboarding or follow up with the prescribing physician as needed.

## 2022-03-31 NOTE — Unmapped (Signed)
11/22: patient has a month tacrolimus on hand. Will check with [facility] on what their price has been and decide when we call back in 2 weeks if he wants [place] to fill or keep it at [facility] -ef  11/22: patient is out of town until Dec 1. Asked for work order already set up by another team member to be changed to fill on 11/30 and delivery on 12/1. Work order adjusted -ef    The following medications is onboarded in this note:  1. 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  I am speaking to the patient.    Was a Nurse, learning disability used for this call? No    Verified patient's date of birth / HIPAA.    Specialty medication(s) to be sent: n/a      Non-specialty medications/supplies to be sent: n/a      Medications not needed at this time: n/a         [facility] Clinical Intervention    Type of intervention: Narrow therapeutic index drug    Medication involved: tacrolimus    Problem identified[facility], clinic ok with possible switch    Intervention performed: pt aware of and ok with switch, knows to contact clinic on date of switch if does fill at [facility]    Follow-up needed: clinic aware, we will keep pt on same mfg going forward    Approximate time spent: 5-10 minutes    Clinical evidence used to support intervention: FDA Orange Book    Result of the intervention: Prevention of an adverse drug event    Thad Ranger, PharmD   [facility] Specialty Pharmacist      The patient declined counseling on medication administration, missed dose instructions, goals of therapy, side effects and monitoring parameters, warnings and precautions, drug/food interactions and storage, handling precautions, and disposal because they have taken the medication previously. The information in the declined sections below are for informational purposes only and was not discussed with patient.   Prograf (tacrolimus)    Medication & Administration     Dosage: using 0.5mg  capsules: take 2 capsules (1mg  total) by mouth twice daily    Administration:   ??? May take with or without food  ??? Take 12 hours apart    Adherence/Missed dose instructions:  ??? Take a missed dose as soon as you think about it.  ??? If it is close to the time for your next dose, skip the missed dose and go back to your normal time.  ??? Do not take 2 doses at the same time or extra doses.    Goals of Therapy     ??? To prevent organ rejection    Side Effects & Monitoring Parameters     ??? Common side effects  ??? Dizziness  ??? Fatigue  ??? Headache  ??? Stuffy nose or sore throat  ??? Nausea, vomiting, stomach pain, diarrhea, constipation  ??? Heartburn  ??? Back or joint pain  ??? Increased risk of infection    ??? The following side effects should be reported to the provider:  ??? Allergic reaction  ??? Kidney issues (change in quantity or urine passed, blood in urine, or weight gain)  ??? High blood pressure (dizziness, change  in eyesight, headache)  ??? Electrolyte issues (change in mood, confusion, muscle pain, or weakness)  ??? Abnormal breathing  ??? Shakiness  ??? Unexplained bleeding or bruising (gums bleeding, blood in urine, nosebleeds, any abnormal bleeding)  ??? Signs of infection (fever, cough, wounds that will not heal)  ??? Skin changes (sores, paleness, new or changed bumps or moles)    ??? Monitoring Parameters  ??? Renal function  ??? Liver function  ??? Glucose levels  ??? Blood pressure  ??? Tacrolimus trough levels  ??? Cardiac monitoring (for QT prolongation)      Contraindications, Warnings, & Precautions     ??? Black Box Warning: Infections - immunosuppressant agents increase the risk of infection that may lead to hospitalization or death  ??? Black Box Warning: Malignancy - immunosuppressant agents may be associated with the development of malignancies that may lead to hospitalization or death  ??? Limit or avoid sun and ultraviolet light exposure, use appropriate sun protection  ??? Myocardial hypertrophy -avoid use in patients with congenital long QT syndrome  ??? Diabetes mellitus - the risk for new-onset diabetes and insulin-dependent post-transplant diabetes mellitus is increased with tacrolimus use after transplantation  ??? GI perforation  ??? Hyperkalemia  ??? Hypertension  ??? Nephrotoxicity  ??? Neurotoxicity  ??? This is a narrow therapeutic index drug. Do not switch manufacturers without first talking to the provider.    Drug/Food Interactions     ??? Medication list reviewed in Epic. The patient was instructed to inform the care team before taking any new medications or supplements. no interactions noted that clinic is not already monitoring.   ??? Avoid alcohol  ??? Avoid grapefruit or grapefruit juice  ??? Avoid live vaccines    Storage, Handling Precautions, & Disposal     ??? Store at room temperature  ??? Keep away from children and pets      Current Medications (including OTC/herbals), Comorbidities and Allergies     Current Outpatient Medications   Medication Sig Dispense Refill   ??? acetaminophen (TYLENOL EXTRA STRENGTH) 500 MG tablet Take 1-2 tablets (500-1,000 mg total) by mouth every six (6) hours as needed for pain. 180 tablet PRN   ??? aspirin (ECOTRIN) 81 MG tablet Take 1 tablet (81 mg total) by mouth daily. 90 tablet 3   ??? atorvastatin (LIPITOR) 40 MG tablet Take 1 tablet (40 mg total) by mouth daily. 90 tablet 3   ??? azaTHIOprine (IMURAN) 50 mg tablet Take 1 tablet (50 mg total) by mouth daily. 90 tablet 3   ??? azithromycin (ZITHROMAX) 250 MG tablet Take 1 tablet (250 mg total) by mouth daily. 30 tablet 11   ??? calcium carbonate (CALCIUM 600) 1,500 mg (600 mg elem calcium) tablet Take 1 tablet (600 mg of elem calcium total) by mouth Two (2) times a day. 60 tablet 11   ??? cholecalciferol, vitamin D3 25 mcg, 1,000 units,, 1,000 unit (25 mcg) tablet Take 2 tablets (50 mcg total) by mouth daily. 100 tablet 11   ??? fluticasone propion-salmeteroL (ADVAIR HFA) 115-21 mcg/actuation inhaler Inhale 2 puffs Two (2) times a day. 12 g 11   ??? fluticasone propionate (FLONASE) 50 mcg/actuation nasal spray Use 2 sprays into each nostril daily. 16 g 11   ??? magnesium oxide (MAG-OX) 400 mg (241.3 mg elemental magnesium) tablet Take 3 tablets (1,200 mg total) by mouth Two (2) times a day. 180 tablet 11   ??? metFORMIN (GLUCOPHAGE) 1000 MG tablet Take 1 tablet (1,000 mg total) by mouth Two (2)  times a day. 180 tablet 3   ??? montelukast (SINGULAIR) 10 mg tablet Take 1 tablet (10 mg total) by mouth daily. 30 tablet 2   ??? pantoprazole (PROTONIX) 40 MG tablet Take 1 tablet (40 mg total) by mouth daily. 90 tablet 3   ??? predniSONE (DELTASONE) 5 MG tablet Take 1.5 tablets (7.5 mg total) by mouth daily. 135 tablet 3   ??? SITagliptin phosphate (JANUVIA) 100 MG tablet Take 1 tablet (100 mg total) by mouth daily. 90 tablet 3   ??? sulfamethoxazole-trimethoprim (BACTRIM) 400-80 mg per tablet Take 1 tablet (80 mg of trimethoprim total) by mouth Every Monday, Wednesday, and Friday. 36 tablet 3   ??? tacrolimus (PROGRAF) 0.5 MG capsule Take 2 capsules (1 mg total) by mouth two (2) times a day. 360 capsule 3   ??? terazosin (HYTRIN) 1 MG capsule Take 3 capsules (3 mg total) by mouth nightly. 90 capsule 11   ??? valGANciclovir (VALCYTE) 450 mg tablet Take 1 tablet (450 mg total) by mouth daily. 30 tablet 11     No current facility-administered medications for this visit.       Allergies   Allergen Reactions   ??? Cetuximab Anaphylaxis     Chest pain/rapid heart rate/ BP dropped   ??? Nsaids (Non-Steroidal Anti-Inflammatory Drug)      Interaction with transplant medications   ??? Venom-Honey Bee Anaphylaxis   ??? Enalapril      Angioedema     ??? Pollen Extracts Other (See Comments)     Nasal stuffiness, coughing, sneezing       Patient Active Problem List   Diagnosis   ??? Malignant neoplasm of colon (CMS-HCC)   ??? Mixed hyperlipidemia   ??? Cataract   ??? Type 2 diabetes mellitus without complication (CMS-HCC)   ??? Colon cancer high risk   ??? Hypertension   ??? 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 Hip pain   ??? Lumbar radiculopathy   ??? Spinal stenosis   ??? Chronic pain   ??? Edema   ??? Routine general medical examination at a health care facility   ??? Other acute sinusitis   ??? Hordeolum externum of left upper eyelid   ??? Abnormal CXR   ??? Cataract, nuclear sclerotic, right eye   ??? Angioedema due to angiotensin converting enzyme inhibitor (ACE-I)   ??? Oral aphthae   ??? Benign prostatic hyperplasia with incomplete bladder emptying   ??? ILD (interstitial lung disease) (CMS-HCC)   ??? Lung transplant recipient (CMS-HCC)   ??? Lung replaced by transplant (CMS-HCC)   ??? Hypogammaglobulinemia (CMS-HCC)   ??? Phimosis   ??? Iron deficiency anemia       Reviewed and up to date in Epic.    Appropriateness of Therapy     Acute infections noted within Epic:  No active infections  Patient reported infection: None    Is medication and dose appropriate based on diagnosis and infection status? Yes    Prescription has been clinically reviewed: Yes      Baseline Quality of Life Assessment      How many days over the past month did your transplant  keep you from your normal activities? For example, brushing your teeth or getting up in the morning. 0    Financial Information     Medication Assistance provided: None Required    Anticipated copay of $24/90ds reviewed with patient. Verified delivery address.    Delivery Information     Scheduled delivery date: n/a- pt has 1  month on hand, [facility] or [facility], will let us know in 2 weeks    Expected start date: already taking    Medication will be delivered via n/a to the n/a address in Epic [place].  This shipment will not require a signature.      Explained the services we provide at [facility] and that each month we would call to set up refills.  Stressed importance of returning phone calls so that we could ensure they receive their medications in time each month.  Informed patient that we should be setting up refills 7-10 days prior to when they will run out of medication.  A pharmacist will reach out to perform a clinical assessment periodically.  Informed patient that a welcome packet, containing information about our pharmacy and other support services, a Notice of Privacy Practices, and a drug information handout will be sent.      The patient or caregiver noted above participated in the development of this care plan and knows that they can request review of or adjustments to the care plan at any time.      Patient or caregiver verbalized understanding of the above information as well as how to contact the pharmacy at [phone] option 4 with any questions/concerns.  The pharmacy is open Monday through Friday 8:30am-4:30pm.  A pharmacist is available 24/7 via pager to answer any clinical questions they may have.    Patient Specific Needs     - Does the patient have any physical, cognitive, or cultural barriers? No    - Does the patient have adequate living arrangements? (i.e. the ability to store and take their medication appropriately) Yes    - Did you identify any home environmental safety or security hazards? No    - Patient prefers to have medications discussed with  Patient     - Is the patient or caregiver able to read and understand education materials at a high school level or above? Yes    - Patient's primary language is  English     - Is the patient high risk? No    SOCIAL DETERMINANTS OF HEALTH     At the [facility], we have learned that life circumstances - like trouble affording food, housing, utilities, or transportation can affect the health of many of our patients.   That is why we wanted to ask: are you currently experiencing any life circumstances that are negatively impacting your health and/or quality of life? No    Social Determinants of Health     Financial Resource Strain: Low Risk  (08/18/2021)    Overall Financial Resource Strain (CARDIA)    ??? Difficulty of Paying Living Expenses: Not hard at all   Internet Connectivity: Internet connectivity concern identified (08/18/2021)    Internet Connectivity    ??? Do you have access to internet services: No    ??? How do you connect to the internet: Not on file    ??? Is your internet connection strong enough for you to watch video on your device without major problems?: Not on file    ??? Do you have enough data to get through the month?: Not on file    ??? Does at least one of the devices have a camera that you can use for video chat?: Not on file   Food Insecurity: No Food Insecurity (08/18/2021)    Hunger Vital Sign    ??? Worried About Running Out of  Food in the Last Year: Never true    ??? Ran Out of Food in the Last Year: Never true   Tobacco Use: Medium Risk (02/19/2022)    Patient History    ??? Smoking Tobacco Use: Former    ??? Smokeless Tobacco Use: Former    ??? Passive Exposure: Never   Housing/Utilities: Low Risk  (08/18/2021)    Housing/Utilities    ??? Within the past 12 months, have you ever stayed: outside, in a car, in a tent, in an overnight shelter, or temporarily in someone else's home (i.e. couch-surfing)?: No    ??? Are you worried about losing your housing?: No    ??? Within the past 12 months, have you been unable to get utilities (heat, electricity) when it was really needed?: No   Alcohol Use: Not At Risk (08/18/2021)    Alcohol Use    ??? How often do you have a drink containing alcohol?: 2 - 3 times per week    ??? How many drinks containing alcohol do you have on a typical day when you are drinking?: 1 - 2    ??? How often do you have 5 or more drinks on one occasion?: Never   Transportation Needs: No Transportation Needs (08/18/2021)    PRAPARE - Transportation    ??? Lack of Transportation (Medical): No    ??? Lack of Transportation (Non-Medical): No   Substance Use: Low Risk  (08/18/2021)    Substance Use    ??? Taken prescription drugs for non-medical reasons: Never    ??? Taken illegal drugs: Never    ??? Patient indicated they have taken drugs in the past year for non-medical reasons: Yes, [positive answer(s)]: Not on file   Health Literacy: Medium Risk (08/18/2021)    Health Literacy    ??? : Rarely   Physical Activity: Sufficiently Active (08/18/2021)    Exercise Vital Sign    ??? Days of Exercise per Week: 7 days    ??? Minutes of Exercise per Session: 60 min   Interpersonal Safety: Not at risk (08/18/2021)    Interpersonal Safety    ??? Unsafe Where You Currently Live: No    ??? Physically Hurt by Anyone: No    ??? Abused by Anyone: No   Stress: No Stress Concern Present (08/18/2021)    [employer] of Occupational Health - Occupational Stress Questionnaire    ??? Feeling of Stress : Not at all   Intimate Partner Violence: Not At Risk (08/18/2021)    Humiliation, Afraid, Rape, and Kick questionnaire    ??? Fear of Current or Ex-Partner: No    ??? Emotionally Abused: No    ??? Physically Abused: No    ??? Sexually Abused: No   Depression: Not at risk (08/18/2021)    PHQ-2    ??? PHQ-2 Score: 0   Social Connections: Moderately Isolated (08/18/2021)    Social Connection and Isolation Panel [NHANES]    ??? Frequency of Communication with Friends and Family: More than three times a week    ??? Frequency of Social Gatherings with Friends and Family: Once a week    ??? Attends Religious Services: Never    ??? Active Member of Clubs or Organizations: No    ??? Attends Banker Meetings: Never    ??? Marital Status: Married       Would you be willing to receive help with any of the needs that you have identified today? Not applicable  Thad Ranger, PharmD  [facility] Specialty Pharmacist

## 2022-03-31 NOTE — Unmapped (Signed)
Called Spencer Pena Sr. to discuss lab results. Tacrolimus level was increased at 12 with a goal of 5-7. Medication regimen is currently 1.5/1. Discussed with [name], CPP, dosing at this time decreased to  1 mg BID after holding tonight's dose. Repeat labs scheduled Friday, he's unable to get labs next week. Resulted labs reviewed. All questions answered. Pt verbalized understanding.

## 2022-04-02 ENCOUNTER — Ambulatory Visit
Admit: 2022-04-02 | Discharge: 2022-04-02 | Disposition: A | Discharge status: Home / Self Care | Payer: MEDICARE | Attending: Emergency Medicine

## 2022-04-02 DIAGNOSIS — Z0189 Encounter for other specified special examinations: Principal | ICD-10-CM

## 2022-04-02 DIAGNOSIS — Z79621 Long-term current use of tacrolimus: Principal | ICD-10-CM

## 2022-04-02 LAB — COMPREHENSIVE METABOLIC PANEL
ALBUMIN: 4.1 g/dL (ref 3.4–5.0)
ALKALINE PHOSPHATASE: 89 U/L (ref 46–116)
ALT (SGPT): 41 U/L (ref 10–49)
ANION GAP: 5 mmol/L (ref 5–14)
AST (SGOT): 39 U/L — ABNORMAL HIGH (ref <=34)
BILIRUBIN TOTAL: 0.4 mg/dL (ref 0.3–1.2)
BLOOD UREA NITROGEN: 32 mg/dL — ABNORMAL HIGH (ref 9–23)
BUN / CREAT RATIO: 19
CALCIUM: 9.1 mg/dL (ref 8.7–10.4)
CHLORIDE: 107 mmol/L (ref 98–107)
CO2: 32 mmol/L — ABNORMAL HIGH (ref 20.0–31.0)
CREATININE: 1.71 mg/dL — ABNORMAL HIGH
EGFR CKD-EPI (2021) MALE: 42 mL/min/{1.73_m2} — ABNORMAL LOW (ref >=60)
GLUCOSE RANDOM: 102 mg/dL (ref 70–179)
POTASSIUM: 4.3 mmol/L (ref 3.4–4.8)
PROTEIN TOTAL: 6.5 g/dL (ref 5.7–8.2)
SODIUM: 144 mmol/L (ref 135–145)

## 2022-04-02 LAB — TACROLIMUS LEVEL, TIMED: TACROLIMUS BLOOD: 8.1 ng/mL

## 2022-04-02 NOTE — Unmapped (Signed)
[facility]  Emergency Department Provider Note     ED Clinical Impression     Final diagnoses:   Routine lab draw (Primary)   Long-term current use of tacrolimus      HPI, Medical Decision Making, ED Course     HPI: 71 y.o. male who has a past medical history of Arthritis, BPH (benign prostatic hyperplasia), Cancer (CMS-HCC), Cataract, Diabetes mellitus (CMS-HCC) (2001), Diverticulosis, Dry eyes, Emphysema of lung (CMS-HCC), History of chemotherapy (2006-2007), History of iron deficiency anemia, History of transfusion, Internal hemorrhoids, and Medical history reviewed with no changes (05/22/2018). who presents with concern for elevated tacrolimus level.  Patient seen by his transplant team for blood draw 3 days ago.  His tacrolimus level was slightly elevated at that time and they requested that he come in for repeat blood draws patient will be out of town next week.  He is denying any symptoms consistent with tacrolimus toxicity, no numbness, tingling, mental status change, chest pain, shortness of breath, change in urination or hematuria.  We will plan to recheck his Tac level and CMP.  I have paged the transplant coordinator to discuss if the patient needs to be held in the ED until his levels return.    Vital signs show mild hypertension, normal O2 sat on room air.  Cardiopulmonary exam is within normal limits.      Orders Placed This Encounter   Procedures    Tacrolimus Level, Timed    Comprehensive metabolic panel       ED Course  ED Course as of 04/02/22 0856   Fri Apr 02, 2022   1610 Patient would very much like to be discharged as he is anxious to leave town.  I have discussed that I have not heard back from the transplant team.  I obtained a good callback number for the patient and we will discharge him as he is asymptomatic.  He ensures me that he will return if there is anything significant on his blood work.    [id number]   [phone] Spoke with Saundra Shelling with transplant who is OK with patient discharge. Their team will follow up his labs.        Discussion of Management with other Physicians, QHP, or Appropriate Source:  Transplant coordinator  Independent Interpretation of Studies: If applicable, documented in ED Course above.  External Records Reviewed: I have reviewed recent and relevant previous record, including: Outpatient notes - pulmonary disease note regarding patient's history of lung transplant from 02/19/2022  Escalation of Care, Consideration of Admission/Observation/Transfer: Admission not required. Appropriate for outpatient management.  Social determinants that significantly affected care:  None    ____________________________________________    The case was discussed with the attending physician, who is in agreement with the above assessment and plan.     Additional History Elements     Chief Complaint  Chief Complaint   Patient presents with    Blood Draw       Outside Historian(s): I have obtained additional independent history/collateral from none available.    Past Medical History:   Diagnosis Date    Arthritis     BPH (benign prostatic hyperplasia)     Cancer (CMS-HCC)     Colon 2006;  Liver 2006    Cataract     Diabetes mellitus (CMS-HCC) 2001    Type II    Diverticulosis     Dry eyes     Emphysema of lung (CMS-HCC)     History of chemotherapy 2006-2007  History of iron deficiency anemia     History of transfusion     Internal hemorrhoids     Medical history reviewed with no changes 05/22/2018    per pt       Past Surgical History:   Procedure Laterality Date    CARDIAC SURGERY      cardiac cath    CATARACT EXTRACTION Left 2011    CATARACT EXTRACTION Right 05/25/2018    PC/IOL    CHG [place], CHEST,REAL TIME Bilateral 06/20/2020    Procedure: ULTRASOUND, CHEST, REAL TIME WITH IMAGE DOCUMENTATION;  Surgeon: Anderson Malta, MD;  Location: BRONCH PROCEDURE LAB [facility];  Service: Pulmonary    COLECTOMY      COLON SURGERY  2006    EYE SURGERY Bilateral 2011; 2018    cataract surgery LIVER SURGERY  2007    PR BRONCHOSCOPY,DIAGNOSTIC N/A 05/15/2020    Procedure: BRONCHOSCOPY, RIGID OR FLEXIBLE, W/WO FLUOROSCOPIC GUIDANCE; DIAGNOSTIC, WITH CELL WASHING, WHEN PERFORMED;  Surgeon: Evert Kohl, MD;  Location: MAIN OR [place];  Service: Thoracic    PR CATH PLACE/CORON ANGIO, IMG SUPER/INTERP,R&L HRT CATH, L HRT VENTRIC N/A 03/04/2020    Procedure: Left/Right Heart Catheterization;  Surgeon: Lesle Reek, MD;  Location: [facility] CATH;  Service: Cardiology    PR COLONOSCOPY FLX DX W/COLLJ SPEC WHEN PFRMD N/A 12/28/2016    Procedure: COLONOSCOPY, FLEXIBLE, PROXIMAL TO SPLENIC FLEXURE; DIAGNOSTIC, W/WO COLLECTION SPECIMEN BY BRUSH OR WASH;  Surgeon: Alfred Levins, MD;  Location: [facility] GI PROCEDURES [facility];  Service: Gastroenterology    PR COLSC FLX W/RMVL OF TUMOR POLYP LESION SNARE TQ N/A 12/11/2021    Procedure: COLONOSCOPY FLEX; W/REMOV TUMOR/LES BY SNARE;  Surgeon: Janace Aris, MD;  Location: [facility]URES [facility];  Service: Gastroenterology    PR ESOPHAGEAL MOTILITY STUDY, MANOMETRY N/A 07/23/2020    Procedure: ESOPHAGEAL MOTILITY STUDY W/INT & REP;  Surgeon: Nurse-Based Giproc;  Location: [facility] PROCEDURES [facility];  Service: Gastroenterology    PR GERD TST W/ NASAL PH ELECTROD N/A 07/23/2020    Procedure: ESOPHAGEAL 24 HOUR PH MONITORING;  Surgeon: Nurse-Based Giproc;  [facility]: [facility]URES [facility];  Service: Gastroenterology    PR LUNG TRANSPLANT,DBL W CP BYPASS Bilateral 05/15/2020    Procedure: LUNG TRANSPL DBL; Ella Jubilee BYPASS;  Surgeon: Evert Kohl, MD;  Location: MAIN OR [facility];  Service: Thoracic    PR UPPER GI ENDOSCOPY,BIOPSY N/A 06/26/2020    Procedure: UGI ENDOSCOPY; WITH BIOPSY, SINGLE OR MULTIPLE;  Surgeon: Chriss Driver, MD;  Location: GI PROCEDURES [facility];  Service: Gastroenterology    PR XCAPSL CTRC RMVL INSJ IO LENS PROSTH W/O ECP Right 05/25/2018    Procedure: EXTRACAPSULAR CATARACT REMOVAL W/INSERTION OF INTRAOCULAR LENS PROSTHESIS, MANUAL OR MECHANICAL TECHNIQUE;  Surgeon: Arville Care, MD;  Location: [facility] OR [facility];  Service: Ophthalmology       No current facility-administered medications for this encounter.    Current Outpatient Medications:     acetaminophen (TYLENOL EXTRA STRENGTH) 500 MG tablet, Take 1-2 tablets (500-1,000 mg total) by mouth every six (6) hours as needed for pain., Disp: 180 tablet, Rfl: PRN    aspirin (ECOTRIN) 81 MG tablet, Take 1 tablet (81 mg total) by mouth daily., Disp: 90 tablet, Rfl: 3    atorvastatin (LIPITOR) 40 MG tablet, Take 1 tablet (40 mg total) by mouth daily., Disp: 90 tablet, Rfl: 3    azaTHIOprine (IMURAN) 50 mg tablet, Take 1 tablet (50 mg total) by mouth daily., Disp: 90 tablet,  Rfl: 3    azithromycin (ZITHROMAX) 250 MG tablet, Take 1 tablet (250 mg total) by mouth daily., Disp: 30 tablet, Rfl: 11    calcium carbonate (CALCIUM 600) 1,500 mg (600 mg elem calcium) tablet, Take 1 tablet (600 mg of elem calcium total) by mouth Two (2) times a day., Disp: 60 tablet, Rfl: 11    cholecalciferol, vitamin D3 25 mcg, 1,000 units,, 1,000 unit (25 mcg) tablet, Take 2 tablets (50 mcg total) by mouth daily., Disp: 100 tablet, Rfl: 11    fluticasone propion-salmeteroL (ADVAIR HFA) 115-21 mcg/actuation inhaler, Inhale 2 puffs Two (2) times a day., Disp: 12 g, Rfl: 11    fluticasone propionate (FLONASE) 50 mcg/actuation nasal spray, Use 2 sprays into each nostril daily., Disp: 16 g, Rfl: 11    magnesium oxide (MAG-OX) 400 mg (241.3 mg elemental magnesium) tablet, Take 3 tablets (1,200 mg total) by mouth Two (2) times a day., Disp: 180 tablet, Rfl: 11    metFORMIN (GLUCOPHAGE) 1000 MG tablet, Take 1 tablet (1,000 mg total) by mouth Two (2) times a day., Disp: 180 tablet, Rfl: 3    montelukast (SINGULAIR) 10 mg tablet, Take 1 tablet (10 mg total) by mouth daily., Disp: 30 tablet, Rfl: 2    pantoprazole (PROTONIX) 40 MG tablet, Take 1 tablet (40 mg total) by mouth daily., Disp: 90 tablet, Rfl: 3    predniSONE (DELTASONE) 5 MG tablet, Take 1.5 tablets (7.5 mg total) by mouth daily., Disp: 135 tablet, Rfl: 3    SITagliptin phosphate (JANUVIA) 100 MG tablet, Take 1 tablet (100 mg total) by mouth daily., Disp: 90 tablet, Rfl: 3    sulfamethoxazole-trimethoprim (BACTRIM) 400-80 mg per tablet, Take 1 tablet (80 mg of trimethoprim total) by mouth Every Monday, Wednesday, and Friday., Disp: 36 tablet, Rfl: 3    tacrolimus (PROGRAF) 0.5 MG capsule, Take 2 capsules (1 mg total) by mouth two (2) times a day., Disp: 360 capsule, Rfl: 3    terazosin (HYTRIN) 1 MG capsule, Take 3 capsules (3 mg total) by mouth nightly., Disp: 90 capsule, Rfl: 11    valGANciclovir (VALCYTE) 450 mg tablet, Take 1 tablet (450 mg total) by mouth daily., Disp: 30 tablet, Rfl: 11    Allergies  Cetuximab, Nsaids (non-steroidal anti-inflammatory drug), Venom-honey bee, Enalapril, and Pollen extracts    Family History  Family History   Problem Relation Age of Onset    Diabetes Mother     Cancer Father     No Known Problems Sister     No Known Problems Brother     No Known Problems Maternal Aunt     No Known Problems Maternal Uncle     No Known Problems Paternal Aunt     No Known Problems Paternal Uncle     No Known Problems Maternal Grandmother     No Known Problems Maternal Grandfather     No Known Problems Paternal Grandmother     No Known Problems Paternal Grandfather     Amblyopia Neg Hx     Blindness Neg Hx     Cataracts Neg Hx     Glaucoma Neg Hx     Hypertension Neg Hx     Macular degeneration Neg Hx     Retinal detachment Neg Hx     Strabismus Neg Hx     Stroke Neg Hx     Thyroid disease Neg Hx        Social History  Social History     Tobacco Use  Smoking status: Former     Packs/day: 1.50     Years: 24.00     Additional pack years: 0.00     Total pack years: 36.00     Types: Cigarettes     Start date: 45     Quit date: 08/27/1989     Years since quitting: 32.6     Passive exposure: Never    Smokeless tobacco: Former     Types: Chew Quit date: 07/23/1989    Tobacco comments:     Used chewing tobacco for about 3 months a long time ago.   Vaping Use    Vaping Use: Never used   Substance Use Topics    Alcohol use: Not Currently     Alcohol/week: 6.0 standard drinks of alcohol     Types: 6 Standard drinks or equivalent per week    Drug use: No     Comment: Took 2 puffs of marijuana in 1985 and said it was scary and he would never do it again.        Physical Exam     VITAL SIGNS:      Vitals:    04/02/22 0833   BP: 145/73   Pulse: 76   Resp: 16   Temp: 36.5 ??C (97.7 ??F)   SpO2: 99%       Constitutional: Alert and oriented. Very pleasant elderly male, appears stated age, talkative and in no distress  Eyes: Conjunctivae are normal.  HEENT: Normocephalic and atraumatic. Conjunctivae clear. No congestion. Moist mucous membranes.   Cardiovascular: Rate as above, regular rhythm. Normal and symmetric distal pulses. Brisk capillary refill. Normal skin turgor.  Respiratory: Normal respiratory effort. Breath sounds are normal. There are no wheezing or crackles heard.  Gastrointestinal: Soft, non-distended, non-tender.  Genitourinary: Deferred.  Musculoskeletal: Non-tender with normal range of motion in all extremities.  Neurologic: Normal speech and language. No gross focal neurologic deficits are appreciated. Patient is moving all extremities equally, face is symmetric at rest and with speech.  Skin: Skin is warm, dry and intact. No rash noted.  Psychiatric: Mood and affect are normal. Speech and behavior are normal.      Pertinent labs & imaging results that were available during my care of the patient were independently interpreted by me and considered in my medical decision making (see chart for details).    Portions of this record have been created using Scientist, clinical (histocompatibility and immunogenetics). Dictation errors have been sought, but may not have been identified and corrected.         Ellen Henri, MD  04/02/22 [phone]

## 2022-04-02 NOTE — Unmapped (Signed)
Page received from ED MD, Audelia Acton had reported to the ED to have repeat lab drawn as they were high on the prior draw.     Dr. Warren Danes paged to confirm that it was appriate for him to leave post lab draw. I confirmed that he was safe to leave and that he was actually only supposed to get labs for follow up, and that I would monitor.     She also reported he was in otherwise excellent shape, with no s/s of tac toxicity.

## 2022-04-02 NOTE — Unmapped (Signed)
Told to come in from transplant team for a blood draw, hx double lung transplant recipient.

## 2022-04-04 NOTE — Unmapped (Signed)
Call placed to Spencer Mallet Sr., let him know his labs had improved, and that I would have [name] reach out Monday with any needed changes.

## 2022-04-05 NOTE — Unmapped (Signed)
Called Spencer Mallet Sr. to discuss lab results. Tacrolimus level was increased at 8 with a goal of 5-7. Medication regimen is currently 1 mg BID. Dosing at this time to remain the same. Repeat labs scheduled two weeks. Resulted labs reviewed. Labs reviewed by on call coordinator over weekend.

## 2022-04-08 MED FILL — MONTELUKAST 10 MG TABLET: ORAL | 30 days supply | Qty: 30 | Fill #2

## 2022-04-08 MED FILL — MAGNESIUM OXIDE 400 MG (241.3 MG MAGNESIUM) TABLET: ORAL | 30 days supply | Qty: 180 | Fill #6

## 2022-04-08 MED FILL — TERAZOSIN 1 MG CAPSULE: ORAL | 30 days supply | Qty: 90 | Fill #7

## 2022-04-08 MED FILL — CALCIUM CARBONATE 600 MG CALCIUM (1,500 MG) TABLET: ORAL | 30 days supply | Qty: 60 | Fill #11

## 2022-04-08 MED FILL — AZITHROMYCIN 250 MG TABLET: ORAL | 30 days supply | Qty: 30 | Fill #11

## 2022-04-08 MED FILL — ACETAMINOPHEN 500 MG TABLET: ORAL | 23 days supply | Qty: 180 | Fill #1

## 2022-04-11 DIAGNOSIS — Z942 Lung transplant status: Principal | ICD-10-CM

## 2022-04-11 DIAGNOSIS — D509 Iron deficiency anemia, unspecified: Principal | ICD-10-CM

## 2022-04-11 LAB — HLA DS POST TRANSPLANT
ANTI-DONOR DRW #1 MFI: 0 MFI
ANTI-DONOR DRW #2 MFI: 0 MFI
ANTI-DONOR HLA-A #1 MFI: 0 MFI
ANTI-DONOR HLA-A #2 MFI: 0 MFI
ANTI-DONOR HLA-B #1 MFI: 2 MFI
ANTI-DONOR HLA-B #2 MFI: 0 MFI
ANTI-DONOR HLA-C #2 MFI: 0 MFI
ANTI-DONOR HLA-DQB #1 MFI: 0 MFI
ANTI-DONOR HLA-DQB #2 MFI: 0 MFI
ANTI-DONOR HLA-DR #1 MFI: 6 MFI
ANTI-DONOR HLA-DR #2 MFI: 1 MFI

## 2022-04-11 LAB — FSAB CLASS 2 ANTIBODY SPECIFICITY: HLA CL2 AB RESULT: POSITIVE

## 2022-04-11 LAB — FSAB CLASS 1 ANTIBODY SPECIFICITY: HLA CLASS 1 ANTIBODY RESULT: NEGATIVE

## 2022-04-14 MED ORDER — ON CALL EXPRESS TEST STRIP
11 refills | 0 days | Status: CP
Start: 2022-04-14 — End: ?
  Filled 2022-04-15: qty 100, 20d supply, fill #0

## 2022-04-14 NOTE — Unmapped (Signed)
[facility] Refill Coordination Note    Specialty Medication(s) to be Shipped:   Transplant: valgancyclovir 450mg     Other medication(s) to be shipped:  advair,test strips,sulfamethoxazole-trimethoprim     Spencer BURKI Sr., DOB: 09-09-1950  Phone: [phone] (home) [phone] (work)      All above HIPAA information was verified with patient.     Was a Nurse, learning disability used for this call? No    Completed refill call assessment today to schedule patient's medication shipment from the [facility] [phone]).  All relevant notes have been reviewed.     Specialty medication(s) and dose(s) confirmed: Regimen is correct and unchanged.   Changes to medications: Roey reports no changes at this time.  Changes to insurance: No  New side effects reported not previously addressed with a pharmacist or physician: None reported  Questions for the pharmacist: No    Confirmed patient received a Conservation officer, historic buildings and a Surveyor, mining with first shipment. The patient will receive a drug information handout for each medication shipped and additional FDA Medication Guides as required.       DISEASE/MEDICATION-SPECIFIC INFORMATION        N/A    SPECIALTY MEDICATION ADHERENCE     Medication Adherence    Patient reported X missed doses in the last month: 0  Specialty Medication: valganciclovir 450mg   Patient is on additional specialty medications: No  Patient is on more than two specialty medications: No  Any gaps in refill history greater than 2 weeks in the last 3 months: no  Demonstrates understanding of importance of adherence: yes  Informant: patient  Reliability of informant: reliable  Provider-estimated medication adherence level: good  Patient is at risk for Non-Adherence: No  Reasons for non-adherence: no problems identified                  Confirmed plan for next specialty medication refill: delivery by pharmacy  Refills needed for supportive medications: not needed Refill Coordination    Has the Patients' Contact Information Changed: No  Is the Shipping Address Different: No         Were doses missed due to medication being on hold? No    vaganciclovir 450 mg: 5 days of medicine on hand       REFERRAL TO PHARMACIST     Referral to the pharmacist: Not needed      [facility]     Shipping address confirmed in Epic.     Delivery Scheduled: Yes, Expected medication delivery date: 12/07.     Medication will be delivered via Same Day Courier to the prescription address in Epic WAM.    Antonietta Barcelona   [facility] Specialty Technician

## 2022-04-15 MED FILL — VALGANCICLOVIR 450 MG TABLET: ORAL | 30 days supply | Qty: 30 | Fill #9

## 2022-04-15 MED FILL — ADVAIR HFA 115 MCG-21 MCG/ACTUATION AEROSOL INHALER: RESPIRATORY_TRACT | 30 days supply | Qty: 12 | Fill #1

## 2022-04-15 MED FILL — SULFAMETHOXAZOLE 400 MG-TRIMETHOPRIM 80 MG TABLET: ORAL | 84 days supply | Qty: 36 | Fill #1

## 2022-04-21 ENCOUNTER — Ambulatory Visit: Admit: 2022-04-21 | Discharge: 2022-04-22 | Payer: MEDICARE

## 2022-04-21 DIAGNOSIS — Z942 Lung transplant status: Principal | ICD-10-CM

## 2022-04-21 LAB — COMPREHENSIVE METABOLIC PANEL
ALBUMIN: 4.2 g/dL (ref 3.4–5.0)
ALKALINE PHOSPHATASE: 103 U/L (ref 46–116)
ALT (SGPT): 49 U/L (ref 10–49)
ANION GAP: 6 mmol/L (ref 5–14)
AST (SGOT): 35 U/L — ABNORMAL HIGH (ref <=34)
BILIRUBIN TOTAL: 0.4 mg/dL (ref 0.3–1.2)
BLOOD UREA NITROGEN: 31 mg/dL — ABNORMAL HIGH (ref 9–23)
BUN / CREAT RATIO: 19
CALCIUM: 9.2 mg/dL (ref 8.7–10.4)
CHLORIDE: 108 mmol/L — ABNORMAL HIGH (ref 98–107)
CO2: 30 mmol/L (ref 20.0–31.0)
CREATININE: 1.66 mg/dL — ABNORMAL HIGH
EGFR CKD-EPI (2021) MALE: 44 mL/min/{1.73_m2} — ABNORMAL LOW (ref >=60)
GLUCOSE RANDOM: 98 mg/dL (ref 70–99)
POTASSIUM: 4.2 mmol/L (ref 3.4–4.8)
PROTEIN TOTAL: 6.8 g/dL (ref 5.7–8.2)
SODIUM: 144 mmol/L (ref 135–145)

## 2022-04-21 LAB — TACROLIMUS LEVEL: TACROLIMUS BLOOD: 13.4 ng/mL

## 2022-04-21 MED ORDER — TACROLIMUS 0.5 MG CAPSULE, IMMEDIATE-RELEASE
ORAL_CAPSULE | Freq: Two times a day (BID) | ORAL | 3 refills | 90 days | Status: CP
Start: 2022-04-21 — End: 2022-04-21

## 2022-04-21 MED ORDER — CALCIUM CARBONATE 600 MG CALCIUM (1,500 MG) TABLET
ORAL_TABLET | Freq: Two times a day (BID) | ORAL | 11 refills | 30 days | Status: CP
Start: 2022-04-21 — End: ?
  Filled 2022-04-27: qty 60, 30d supply, fill #0

## 2022-04-21 NOTE — Unmapped (Signed)
[facility] Refill Coordination Note    Specialty Medication(s) to be Shipped:   Transplant: azathioprine     Other medication(s) to be shipped:  calcium , flonase , Januvia mag-ox and metformin      Spencer LANE Sr., DOB: 01-28-1951  Phone: [phone] (home) [phone] (work)      All above HIPAA information was verified with patient.     Was a Nurse, learning disability used for this call? No    Completed refill call assessment today to schedule patient's medication shipment from the [facility] [phone]).  All relevant notes have been reviewed.     Specialty medication(s) and dose(s) confirmed: Regimen is correct and unchanged.   Changes to medications: Spencer Pena reports no changes at this time.  Changes to insurance: No  New side effects reported not previously addressed with a pharmacist or physician: None reported  Questions for the pharmacist: No    Confirmed patient received a Conservation officer, historic buildings and a Surveyor, mining with first shipment. The patient will receive a drug information handout for each medication shipped and additional FDA Medication Guides as required.       DISEASE/MEDICATION-SPECIFIC INFORMATION        N/A    SPECIALTY MEDICATION ADHERENCE     Medication Adherence    Patient reported X missed doses in the last month: 0  Specialty Medication: azathioprine 50 mg tablet (IMURAN)  Patient is on additional specialty medications: No                                Were doses missed due to medication being on hold? No    azathioprine 50 mg: 12 days of medicine on hand       REFERRAL TO PHARMACIST     Referral to the pharmacist: Not needed      [facility]     Shipping address confirmed in Epic.     Delivery Scheduled: Yes, Expected medication delivery date: 04/28/22.     Medication will be delivered via UPS to the prescription address in Epic WAM.    [name]   [facility] Specialty Technician

## 2022-04-21 NOTE — Unmapped (Signed)
Called Spencer Mallet Sr. to discuss lab results. Tacrolimus level was increased at 13.4 with a goal of 5-7. Medication regimen is currently 1 mg BID. Discussed with [name], CPP, dosing at this time decreased to  0.5 mg BID after holding tonight's dose. Repeat labs scheduled one week. Resulted labs reviewed. All questions answered. Pt verbalized understanding.

## 2022-04-27 ENCOUNTER — Ambulatory Visit: Admit: 2022-04-27 | Discharge: 2022-04-28 | Payer: MEDICARE

## 2022-04-27 LAB — COMPREHENSIVE METABOLIC PANEL
ALBUMIN: 4.1 g/dL (ref 3.4–5.0)
ALKALINE PHOSPHATASE: 93 U/L (ref 46–116)
ALT (SGPT): 41 U/L (ref 10–49)
ANION GAP: 10 mmol/L (ref 5–14)
AST (SGOT): 29 U/L (ref <=34)
BILIRUBIN TOTAL: 0.5 mg/dL (ref 0.3–1.2)
BLOOD UREA NITROGEN: 42 mg/dL — ABNORMAL HIGH (ref 9–23)
BUN / CREAT RATIO: 23
CALCIUM: 9.8 mg/dL (ref 8.7–10.4)
CHLORIDE: 107 mmol/L (ref 98–107)
CO2: 26 mmol/L (ref 20.0–31.0)
CREATININE: 1.81 mg/dL — ABNORMAL HIGH
EGFR CKD-EPI (2021) MALE: 39 mL/min/{1.73_m2} — ABNORMAL LOW (ref >=60)
GLUCOSE RANDOM: 129 mg/dL — ABNORMAL HIGH (ref 70–99)
POTASSIUM: 4.2 mmol/L (ref 3.4–4.8)
PROTEIN TOTAL: 6.8 g/dL (ref 5.7–8.2)
SODIUM: 143 mmol/L (ref 135–145)

## 2022-04-27 LAB — TACROLIMUS LEVEL: TACROLIMUS BLOOD: 6.1 ng/mL

## 2022-04-27 MED FILL — AZATHIOPRINE 50 MG TABLET: ORAL | 90 days supply | Qty: 90 | Fill #2

## 2022-04-27 MED FILL — JANUVIA 100 MG TABLET: ORAL | 90 days supply | Qty: 90 | Fill #2

## 2022-04-27 MED FILL — METFORMIN 1,000 MG TABLET: ORAL | 90 days supply | Qty: 180 | Fill #2

## 2022-04-27 MED FILL — MAGNESIUM OXIDE 400 MG (241.3 MG MAGNESIUM) TABLET: ORAL | 30 days supply | Qty: 180 | Fill #7

## 2022-04-27 MED FILL — FLUTICASONE PROPIONATE 50 MCG/ACTUATION NASAL SPRAY,SUSPENSION: NASAL | 30 days supply | Qty: 16 | Fill #1

## 2022-04-27 NOTE — Unmapped (Signed)
Called [name] to discuss lab results. Tacrolimus level was normal at 6.1 with a goal of 5-7. Medication regimen is currently 0.5 mg BID. Dosing at this time to remain the same. Repeat labs scheduled one month with clinic. Resulted labs reviewed. All questions answered. Pt verbalized understanding.

## 2022-05-05 DIAGNOSIS — J4481 Bronchiolitis obliterans: Principal | ICD-10-CM

## 2022-05-05 MED ORDER — AZITHROMYCIN 250 MG TABLET
ORAL_TABLET | Freq: Every day | ORAL | 11 refills | 30 days | Status: CP
Start: 2022-05-05 — End: ?
  Filled 2022-05-11: qty 90, 90d supply, fill #0

## 2022-05-05 MED ORDER — MONTELUKAST 10 MG TABLET
ORAL_TABLET | Freq: Every day | ORAL | 2 refills | 30 days | Status: CP
Start: 2022-05-05 — End: 2023-05-05
  Filled 2022-05-11: qty 90, 90d supply, fill #0

## 2022-05-05 NOTE — Unmapped (Signed)
[facility] Refill Coordination Note    Specialty Medication(s) to be Shipped:   Transplant: valgancyclovir 450mg     Other medication(s) to be shipped:  test strips,, vitamin d, terazosin, singular, azithromycin     Spencer Mallet Sr., DOB: June 19, 1950  Phone: [phone] (home) [phone] (work)      All above HIPAA information was verified with patient.     Was a Nurse, learning disability used for this call? No    Completed refill call assessment today to schedule patient's medication shipment from the [facility] [phone]).  All relevant notes have been reviewed.     Specialty medication(s) and dose(s) confirmed: Regimen is correct and unchanged.   Changes to medications: Lorraine reports no changes at this time.  Changes to insurance: No  New side effects reported not previously addressed with a pharmacist or physician: None reported  Questions for the pharmacist: No    Confirmed patient received a Conservation officer, historic buildings and a Surveyor, mining with first shipment. The patient will receive a drug information handout for each medication shipped and additional FDA Medication Guides as required.       DISEASE/MEDICATION-SPECIFIC INFORMATION        N/A    SPECIALTY MEDICATION ADHERENCE     Medication Adherence    Patient reported X missed doses in the last month: 0  Specialty Medication: valGANciclovir 450 mg tablet (VALCYTE)  Patient is on additional specialty medications: No  Patient is on more than two specialty medications: No                                  Were doses missed due to medication being on hold? No    vaganciclovir 450 mg: 7 days of medicine on hand       REFERRAL TO PHARMACIST     Referral to the pharmacist: Not needed      [facility]     Shipping address confirmed in Epic.     Delivery Scheduled: Yes, Expected medication delivery date: 05/07/22.     Medication will be delivered via Same Day Courier to the prescription address in Epic WAM.    Ernestine Mcmurray   [facility] Specialty Technician

## 2022-05-07 NOTE — Unmapped (Signed)
Spencer AGUINO Sr. 's entire shipment will be delayed as a result of the medication is too soon to refill until 05/10/2022.     I have reached out to the patient  at [phone] and communicated the delivery change. We will reschedule the medication for the delivery date that the patient agreed upon.  We have confirmed the delivery date as 05/11/2022, via same day courier.

## 2022-05-11 MED FILL — CHOLECALCIFEROL (VITAMIN D3) 25 MCG (1,000 UNIT) TABLET: ORAL | 100 days supply | Qty: 200 | Fill #6

## 2022-05-11 MED FILL — TERAZOSIN 1 MG CAPSULE: ORAL | 90 days supply | Qty: 270 | Fill #8

## 2022-05-11 MED FILL — VALGANCICLOVIR 450 MG TABLET: ORAL | 60 days supply | Qty: 60 | Fill #10

## 2022-05-11 MED FILL — ON CALL EXPRESS TEST STRIP: 20 days supply | Qty: 100 | Fill #1

## 2022-05-16 DIAGNOSIS — Z942 Lung transplant status: Principal | ICD-10-CM

## 2022-05-16 DIAGNOSIS — D509 Iron deficiency anemia, unspecified: Principal | ICD-10-CM

## 2022-05-18 NOTE — Unmapped (Signed)
Pulmonary Transplant Clinic    HISTORY:   HPI:  Mr. Spencer Pena is a 72 y.o.  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, also with type 2 diabetes mellitus, hypertension, colon cancer in 2007 s/p partial colon and hepatic wedge resection and FOLFOX here for post transplant care    - Feeling well overall  - Denies cough, SOB, sputum production, fevers, chills  - Nose runs all the time, using flonase and has had bloody nose.  - Denies pharyngitis.  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     --> basiliximab induction    --> CMV high risk (D+/R-)    --> EBV moderate risk (D+/R+)    --> Not Increased Risk Donor  - Colon Cancer 2007    --> s/p resection of liver mets    --> s/p resection of 6 inches of colon    --> FOLFOX Chemo  - type 2 diabetes mellitus     --> not insulin dependent  - OA    SOCIAL HISTORY:   - 20pk years, quit in 1991  - previously drank about 10 drinks per week   - Worked maintenance at [employer]  - Married and lives in [place]    FAMILY HISTORY:   - Parents lived into late 90's    MEDS: (personally reviewed in [place], pertinent meds noted below)     Current Outpatient Medications on File Prior to Visit   Medication Sig Dispense Refill    acetaminophen (TYLENOL EXTRA STRENGTH) 500 MG tablet Take 1-2 tablets (500-1,000 mg total) by mouth every six (6) hours as needed for pain. 180 tablet PRN    aspirin (ECOTRIN) 81 MG tablet Take 1 tablet (81 mg total) by mouth daily. 90 tablet 3    atorvastatin (LIPITOR) 40 MG tablet Take 1 tablet (40 mg total) by mouth daily. 90 tablet 3    azathioprine (IMURAN) 50 mg tablet Take 1 tablet (50 mg total) by mouth daily. 90 tablet 3    azithromycin (ZITHROMAX) 250 MG tablet Take 1 tablet (250 mg total) by mouth daily. 30 tablet 11    blood sugar diagnostic (ON CALL EXPRESS TEST STRIP) Strp Test daily before all meals/snacks and once before bedtime. 100 each 11    calcium carbonate (CALCIUM 600) 1,500 mg (600 mg elem calcium) tablet Take 1 tablet (600 mg of elem calcium total) by mouth Two (2) times a day. 60 tablet 11    cholecalciferol, vitamin D3 25 mcg, 1,000 units,, 1,000 unit (25 mcg) tablet Take 2 tablets (50 mcg total) by mouth daily. 100 tablet 11    fluticasone propion-salmeteroL (ADVAIR HFA) 115-21 mcg/actuation inhaler Inhale 2 puffs Two (2) times a day. 12 g 11    fluticasone propionate (FLONASE) 50 mcg/actuation nasal spray Use 2 sprays into each nostril daily. 16 g 11    magnesium oxide (MAG-OX) 400 mg (241.3 mg elemental magnesium) tablet Take 3 tablets (1,200 mg total) by mouth Two (2) times a day. 180 tablet 11    metFORMIN (GLUCOPHAGE) 1000 MG tablet Take 1 tablet (1,000 mg total) by mouth Two (2) times a day. 180 tablet 3    montelukast (SINGULAIR) 10  mg tablet Take 1 tablet (10 mg total) by mouth daily. 30 tablet 2    pantoprazole (PROTONIX) 40 MG tablet Take 1 tablet (40 mg total) by mouth daily. 90 tablet 3    predniSONE (DELTASONE) 5 MG tablet Take 1.5 tablets (7.5 mg total) by mouth daily. 135 tablet 3    SITagliptin phosphate (JANUVIA) 100 MG tablet Take 1 tablet (100 mg total) by mouth daily. 90 tablet 3    sulfamethoxazole-trimethoprim (BACTRIM) 400-80 mg per tablet Take 1 tablet (80 mg of trimethoprim total) by mouth Every Monday, Wednesday, and Friday. 36 tablet 3    tacrolimus (PROGRAF) 0.5 MG capsule Take 1 capsule (0.5 mg total) by mouth two (2) times a day. 180 capsule 3    terazosin (HYTRIN) 1 MG capsule Take 3 capsules (3 mg total) by mouth nightly. 90 capsule 11    valGANciclovir (VALCYTE) 450 mg tablet Take 1 tablet (450 mg total) by mouth daily. 30 tablet 11     No current facility-administered medications on file prior to visit.       OBJECTIVE DATA:   PHYSICAL EXAM:  BP 133/60 (BP Site: R Arm, BP Position: Sitting, BP Cuff Size: Medium)  - Pulse 77  - Temp 36.4 ??C (97.5 ??F) (Tympanic)  - Ht 170.2 cm (5' 7)  - Wt 70.8 kg (156 lb)  - SpO2 98%  - BMI 24.43 kg/m??      Gen: - awake, alert, in NAD  Derm: - bruising on BL arms  Vascular: - good pulses throughout  CV: - RRR, no m/r/g  Pulm: - CTAB, good air movement BL   Abd: - soft, NT, ND, no hepatosplenomegaly  Ext: - no edema of LE  Neuro: - Alert, oriented, follows commands. Moving all extremities spontaneously    LABS: (reviewed in Epic, pertinent values noted below)   PFTs: (personally reviewed and interpereted):    Date: FVC (% Pred) FEV1 (% Pred) FEF25-75(% Pred) DLCO TBBx/Results           05/28/22 4.16 (121.5%) 3.69 (141.4%) 3.23 (307.1%)     02/19/2022 4.03 (110.4) 3.52 (126.7) 5.19 (241.3)     11/17/21 4.13 (113%) 3.63 (130%)      08/18/21 4.01 (109%) 3.55 (127%)      05/15/2021 4.21(114%) 3.65 (130.3%) 5.54 (254%)     04/06/2021 4.08 (111%) 3.64 (129.6%) 6.01 (274.3%)     12/31/20 3.95 (107.1%) 3.46 (122.6%) 5.20 (236.4%)     12/03/2020 3.87 (104.9%) 3.47 (122.9%) 5.69 (258.3%)     10/03/2020 3.68 (99.4) 3.13 (110.6)  4.22 (190.6)      09/05/20  3.70  3.14      08/20/20 3.65 (98%) 3.15 (111%) 4.69 (211%)      08/01/20 3.64 (98.2%) 3.10 (109.3%) 4.41 (198.8%)     07/18/20 3.49 (94%) 2.98 (105%) 4.20 (189%)     07/04/20 3.43 (92.5%) 2.97 (104.7%) 4.26 (191.7%)     06/27/2020 3.13 84.5 2.70 95.4 3.51 157.8     06/20/20 3.39 (91.4%) 2.82 (99.2%) 3.34 (150.35)     06/13/20 3.10 (72.8%) 2.66 (82.5%) 3.34 (136.2%)      05/15/20      BOLT    03/11/20  2.75 (72%)  2.36 (81%)       02/12/20  2.85 (75%)  2.44 (86%)                IMAGING: Reviewed in EMR, CXR with broken sternal wires, otherwise stable    ASSESSMENT and PLAN  Mr. Spencer Pena is a 72 y.o. man with IPF and emphysema s/p BOLT on 05/15/2020, also with type 2 diabetes mellitus, hypertension, colon cancer in 2007 s/p partial colon and hepatic wedge resection and FOLFOX here for follow up    Graft Function and Immunosuppression:  - PFTs excellent, CXR stable, clinically feeling well  - HLA status: No DSAs, checked monthly, last 03/2022  - Total IgG <700 but insurance will not approve IVIG  - Immunosuppression:   - Continue tac goal 5-7. Currently taking 1.5/2.   - AZA 50 mg daily; dropped from 100 mg 08/01/20  -Stopped MMF 06/13/20 (GI upset)  - Prednisone 7.5mg  daily  - BOS PPX  - Azithromycin 250mg  daily  - ASA 81mg  daily  - Continue atorvastatin 02/2022  - Continue Advair BID  - Montelukast 10mg  daily    Antimicrobial Prophylaxis:  - CMV, high risk (D+/R-): valganciclovir 450mg  daily indefinitely for CrCl cannot be calculated (Unknown ideal weight.). Negative VL 12/29/2021. Plan for repeat T cell assay at 2 years.  - EBV (D+/R+): negative 12/29/21  - Fungal: Posaconazole stopped at 13mo  - PCP: Bactrim SS MWF    Pancytopenia: Mild and stable over one year. Monitoring on current immunosuppressive regimen for now     Broken sternal wires  - noted on CXR in clinic 02/2022  - asymptomatic    Skin Bruising - start biotin and CoQ10. Likely related to prednisone and aspirin.    Pain control - previously required gabapentin, now only on PRN acetaminophen.    OSA   - STOP-BANG 4 points (male, age, observed apnea, snoring).  - PSG 12/29/2020   CPAP at level of 10 cm H2O with expiratory pressure relief (EPR) = 3 and with heated humidifier for nasal dryness, mask: AirFit N20 size medium or mask of patient???s preference.  The patient needs to follow up for CPAP compliance 31 to 90 days after initiation of CPAP therapy to ensure compliance of at least 4 hours per night for more than 70% of nights.  - Not using CPAP d/t discomfort. Talking to home health company.     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  - pH probe with DeMeester 16.7%, slightly in abnormal range but will hold off on surgical intervention at this time in the setting of normal graft function.  - Daily PPI     BPH - terazosin 3mg  at bedtime, continues with nocturia  - UA today  - Normal CEA and PSA  - Will refer to urology    CKD IIIa - monitoring    Iron Deficiency Anemia - received IV iron 05/22/20, ferritin 12.5 08/2021, continues to be low    Hypomagnesemia - magnesium oxide 1200 mg BID     Type 2 diabetes mellitus   - Lantus will drop to 12 units in the nightly with pred adjustment  - aspart 7/7/3 with pred adjustment  - off insulin now  - metformin 1000mg  BID  - Januvia  - sugars well controlled  - PVL ABI and carotid duplex normal 11/2021    Bone Health: Last Dexa: osteopenia on DEXA 11/17/2021  - on Ca and Vit D supplementation currently    Health Maintenance:  - Last derm visit:  12/2021  - Last Colonsocopy (if >40): 12/2021  - Last flu shot:   02/19/2022; needs repeat 05/28/22  - RSV vaccine:   Due, advised to obtain locally due to insurance requirements  - Last Pneumovax:  06/10/2004, 12/30/2017  - Last Prevnar:  03/25/2016  - TdaP:   02/11/2018  - Shingrix:    03/10/20, 08/20/20  - COVID-19 (Moderna) 06/11/19, 07/09/19, 03/10/20, 07/18/2020, 12/03/20 received 1/2 dose Evushield,03/2021 (Bivalent); SpikeVax 02/19/2022  - Last Dexa:   02/2020, 11/17/21  - Last vitamin D:  66.9 05/2021  - Last HbA1c:   6.2 (02/2022)    Immunization History   Administered Date(s) Administered    COVID-19 VAC,BIVALENT(3YR UP),PFIZER 08/18/2021    COVID-19 VACCINE,MRNA(MODERNA)(PF) 06/11/2019, 07/09/2019, 03/10/2020, 07/18/2020, 12/03/2020    Covid-19 Vac, (45yr+) (Spikevax) Monovalent Xbb.1.5 Moder  02/19/2022    HEPATITIS B VACCINE ADULT, ADJUVANTED, IM(HEPLISAV B) 03/12/2020    INFLUENZA INJ MDCK PF, QUAD,(FLUCELVAX)(65MO AND UP EGG FREE) 02/08/2020    INFLUENZA TIV (TRI) PF (IM) 02/27/2010    Influenza Vaccine Quad(IM)6 MO-Adult(PF) 01/19/2019, 04/06/2021, 02/19/2022    Influenza Virus Vaccine, unspecified formulation 02/07/2013, 02/28/2015, 02/05/2016, 02/02/2017, 01/27/2018    PNEUMOCOCCAL POLYSACCHARIDE 23-VALENT 06/10/2004, 12/30/2017    Pneumococcal Conjugate 13-Valent 03/25/2016    SHINGRIX-ZOSTER VACCINE (HZV),RECOMBINANT,ADJUVANTED(IM) 03/10/2020, 08/20/2020    TdaP 02/15/2012, 02/11/2018     >64min was spent with the patient face to face and >57min was spent reviewing chart/imaging.     Complex medical decision making was done as we adjust medications based on blood work/drug levels and multiple complaints and problems were addressed    The patient was assessed and discussed with Dr. Dudley Major who is in agreement with plan.    Sharee Pimple MSN, FNP-C  May 28, 2022 11:12 AM  Lung Transplant Nurse Practitioner  Pager [phone]

## 2022-05-25 DIAGNOSIS — Z139 Encounter for screening, unspecified: Principal | ICD-10-CM

## 2022-05-25 DIAGNOSIS — Z859 Personal history of malignant neoplasm, unspecified: Principal | ICD-10-CM

## 2022-05-28 ENCOUNTER — Ambulatory Visit: Admit: 2022-05-28 | Discharge: 2022-05-28 | Payer: MEDICARE

## 2022-05-28 ENCOUNTER — Encounter: Admit: 2022-05-28 | Discharge: 2022-05-28 | Payer: MEDICARE | Attending: Internal Medicine | Primary: Internal Medicine

## 2022-05-28 ENCOUNTER — Ambulatory Visit: Admit: 2022-05-28 | Discharge: 2022-05-28 | Payer: MEDICARE | Attending: Family | Primary: Family

## 2022-05-28 DIAGNOSIS — Z942 Lung transplant status: Principal | ICD-10-CM

## 2022-05-28 DIAGNOSIS — R3589 Polyuria: Principal | ICD-10-CM

## 2022-05-28 LAB — COMPREHENSIVE METABOLIC PANEL
ALBUMIN: 3.9 g/dL (ref 3.4–5.0)
ALKALINE PHOSPHATASE: 95 U/L (ref 46–116)
ALT (SGPT): 56 U/L — ABNORMAL HIGH (ref 10–49)
ANION GAP: 6 mmol/L (ref 5–14)
AST (SGOT): 41 U/L — ABNORMAL HIGH (ref <=34)
BILIRUBIN TOTAL: 0.6 mg/dL (ref 0.3–1.2)
BLOOD UREA NITROGEN: 48 mg/dL — ABNORMAL HIGH (ref 9–23)
BUN / CREAT RATIO: 28
CALCIUM: 9.9 mg/dL (ref 8.7–10.4)
CHLORIDE: 106 mmol/L (ref 98–107)
CO2: 31 mmol/L (ref 20.0–31.0)
CREATININE: 1.74 mg/dL — ABNORMAL HIGH
EGFR CKD-EPI (2021) MALE: 41 mL/min/{1.73_m2} — ABNORMAL LOW (ref >=60)
GLUCOSE RANDOM: 102 mg/dL — ABNORMAL HIGH (ref 70–99)
POTASSIUM: 4.2 mmol/L (ref 3.5–5.1)
PROTEIN TOTAL: 6.2 g/dL (ref 5.7–8.2)
SODIUM: 143 mmol/L (ref 135–145)

## 2022-05-28 LAB — CBC W/ AUTO DIFF
BASOPHILS ABSOLUTE COUNT: 0 10*9/L (ref 0.0–0.1)
BASOPHILS RELATIVE PERCENT: 0.6 %
EOSINOPHILS ABSOLUTE COUNT: 0 10*9/L (ref 0.0–0.5)
EOSINOPHILS RELATIVE PERCENT: 1.3 %
HEMATOCRIT: 36.1 % — ABNORMAL LOW (ref 39.0–48.0)
HEMOGLOBIN: 12.5 g/dL — ABNORMAL LOW (ref 12.9–16.5)
LYMPHOCYTES ABSOLUTE COUNT: 0.4 10*9/L — ABNORMAL LOW (ref 1.1–3.6)
LYMPHOCYTES RELATIVE PERCENT: 14.5 %
MEAN CORPUSCULAR HEMOGLOBIN CONC: 34.6 g/dL (ref 32.0–36.0)
MEAN CORPUSCULAR HEMOGLOBIN: 32.6 pg — ABNORMAL HIGH (ref 25.9–32.4)
MEAN CORPUSCULAR VOLUME: 94.3 fL (ref 77.6–95.7)
MEAN PLATELET VOLUME: 7.4 fL (ref 6.8–10.7)
MONOCYTES ABSOLUTE COUNT: 0.2 10*9/L — ABNORMAL LOW (ref 0.3–0.8)
MONOCYTES RELATIVE PERCENT: 8.6 %
NEUTROPHILS ABSOLUTE COUNT: 2 10*9/L (ref 1.8–7.8)
NEUTROPHILS RELATIVE PERCENT: 75 %
PLATELET COUNT: 118 10*9/L — ABNORMAL LOW (ref 150–450)
RED BLOOD CELL COUNT: 3.82 10*12/L — ABNORMAL LOW (ref 4.26–5.60)
RED CELL DISTRIBUTION WIDTH: 15 % (ref 12.2–15.2)
WBC ADJUSTED: 2.7 10*9/L — ABNORMAL LOW (ref 3.6–11.2)

## 2022-05-28 LAB — URINALYSIS WITH MICROSCOPY WITH CULTURE REFLEX
BILIRUBIN UA: NEGATIVE
BLOOD UA: NEGATIVE
GLUCOSE UA: NEGATIVE
KETONES UA: NEGATIVE
LEUKOCYTE ESTERASE UA: NEGATIVE
NITRITE UA: NEGATIVE
PH UA: 5 (ref 5.0–9.0)
RBC UA: 1 /HPF (ref <=3)
SPECIFIC GRAVITY UA: 1.025 (ref 1.003–1.030)
SQUAMOUS EPITHELIAL: 1 /HPF (ref 0–5)
UROBILINOGEN UA: <2
WBC UA: 1 /HPF (ref <=2)

## 2022-05-28 LAB — TACROLIMUS LEVEL, TROUGH: TACROLIMUS, TROUGH: 8 ng/mL (ref 5.0–15.0)

## 2022-05-28 LAB — IGG: GAMMAGLOBULIN; IGG: 719 mg/dL (ref 650–1600)

## 2022-05-28 LAB — PSA: PROSTATE SPECIFIC ANTIGEN: 1.21 ng/mL (ref 0.00–4.00)

## 2022-05-28 LAB — EBV QUANTITATIVE PCR, BLOOD: EBV VIRAL LOAD RESULT: NOT DETECTED

## 2022-05-28 LAB — MAGNESIUM: MAGNESIUM: 1.9 mg/dL (ref 1.6–2.6)

## 2022-05-28 LAB — PHOSPHORUS: PHOSPHORUS: 1.9 mg/dL — ABNORMAL LOW (ref 2.4–5.1)

## 2022-05-28 LAB — CMV DNA, QUANTITATIVE, PCR: CMV VIRAL LD: NOT DETECTED

## 2022-05-28 LAB — CEA: CARCINOEMBRYONIC ANTIGEN: 1.8 ng/mL (ref 0.0–5.0)

## 2022-05-28 NOTE — Unmapped (Signed)
In with Dr. Dudley Major to see Spencer Mallet Sr.. Doing well, PFTs stable.  Urinating frequently, will get UA today and refer to urology.  Flu shot today.  Will get RSV at local pharmacy.  Return to clinic in 3 months. Questions answered, verbalized understanding.

## 2022-05-28 NOTE — Unmapped (Signed)
Called Spencer Mallet Sr. to discuss lab results. Tacrolimus level was increased at 8 with a goal of 5-7. Medication regimen is currently 0.5 mg BID. Discussed with Nicholaus Bloom, NP, dosing at this time to remain the same. Repeat labs scheduled two weeks. Resulted labs reviewed. All questions answered. Pt verbalized understanding.

## 2022-05-28 NOTE — Unmapped (Signed)
Urine was collected and sent to the lab.

## 2022-05-28 NOTE — Unmapped (Signed)
Patient's heart rate was variable between the 70's and 115.  He is asymptomatic.  Nicholaus Bloom, FNP aware.    Per provider, the patient received influenza Fluad vaccine.  Patient ID verified with name and date of birth.  All screening questions were answered.  Vaccine(s) were administered as ordered.  See immunization history for documentation.  Patient tolerated the injection(s) well with no issues noted.  Vaccine Information sheet given to the patient.

## 2022-06-01 NOTE — Unmapped (Signed)
[facility] Refill Coordination Note    Specialty Medication(s) to be Shipped:   Transplant: Prednisone 5mg     Other medication(s) to be shipped:  aspirin , atorvastatin , calcium , flonase , mag-ox and test strips      Spencer VANANDEL Sr., DOB: [birth year]  Phone: [phone] (home) [phone] (work)      All above HIPAA information was verified with patient.     Was a Nurse, learning disability used for this call? No    Completed refill call assessment today to schedule patient's medication shipment from the [facility] [phone]).  All relevant notes have been reviewed.     Specialty medication(s) and dose(s) confirmed: Regimen is correct and unchanged.   Changes to medications: Jalik reports no changes at this time.  Changes to insurance: No  New side effects reported not previously addressed with a pharmacist or physician: None reported  Questions for the pharmacist: No    Confirmed patient received a Conservation officer, historic buildings and a Surveyor, mining with first shipment. The patient will receive a drug information handout for each medication shipped and additional FDA Medication Guides as required.       DISEASE/MEDICATION-SPECIFIC INFORMATION        N/A    SPECIALTY MEDICATION ADHERENCE     Medication Adherence    Patient reported X missed doses in the last month: 0  Specialty Medication: predniSONE 5 MG tablet (DELTASONE)  Patient is on additional specialty medications: No                                Were doses missed due to medication being on hold? No    prednisone 5 mg: 10 days of medicine on hand       REFERRAL TO PHARMACIST     Referral to the pharmacist: Not needed      [facility]     Shipping address confirmed in Epic.     Delivery Scheduled: Yes, Expected medication delivery date: 06/04/22.     Medication will be delivered via UPS to the prescription address in Epic WAM.    [name]   [facility] Specialty Technician

## 2022-06-03 MED FILL — ATORVASTATIN 40 MG TABLET: ORAL | 90 days supply | Qty: 90 | Fill #1

## 2022-06-03 MED FILL — FLUTICASONE PROPIONATE 50 MCG/ACTUATION NASAL SPRAY,SUSPENSION: NASAL | 90 days supply | Qty: 48 | Fill #2

## 2022-06-03 MED FILL — ASPIRIN 81 MG TABLET,DELAYED RELEASE: ORAL | 90 days supply | Qty: 90 | Fill #1

## 2022-06-03 MED FILL — MAGNESIUM OXIDE 400 MG (241.3 MG MAGNESIUM) TABLET: ORAL | 120 days supply | Qty: 720 | Fill #8

## 2022-06-03 MED FILL — PREDNISONE 5 MG TABLET: ORAL | 90 days supply | Qty: 135 | Fill #3

## 2022-06-03 MED FILL — PANTOPRAZOLE 40 MG TABLET,DELAYED RELEASE: ORAL | 90 days supply | Qty: 90 | Fill #1

## 2022-06-03 MED FILL — CALCIUM CARBONATE 600 MG CALCIUM (1,500 MG) TABLET: ORAL | 90 days supply | Qty: 180 | Fill #1

## 2022-06-03 MED FILL — ON CALL EXPRESS TEST STRIP: 60 days supply | Qty: 300 | Fill #2

## 2022-06-04 LAB — HLA DS POST TRANSPLANT
ANTI-DONOR DRW #1 MFI: 0 MFI
ANTI-DONOR DRW #2 MFI: 0 MFI
ANTI-DONOR HLA-A #1 MFI: 0 MFI
ANTI-DONOR HLA-A #2 MFI: 15 MFI
ANTI-DONOR HLA-B #1 MFI: 7 MFI
ANTI-DONOR HLA-B #2 MFI: 0 MFI
ANTI-DONOR HLA-C #2 MFI: 0 MFI
ANTI-DONOR HLA-DP #2 MFI: 0 MFI
ANTI-DONOR HLA-DQB #1 MFI: 0 MFI
ANTI-DONOR HLA-DQB #2 MFI: 0 MFI
ANTI-DONOR HLA-DR #1 MFI: 0 MFI
ANTI-DONOR HLA-DR #2 MFI: 0 MFI

## 2022-06-04 LAB — FSAB CLASS 1 ANTIBODY SPECIFICITY: HLA CLASS 1 ANTIBODY RESULT: NEGATIVE

## 2022-06-04 LAB — FSAB CLASS 2 ANTIBODY SPECIFICITY: HLA CL2 AB RESULT: POSITIVE

## 2022-06-11 ENCOUNTER — Ambulatory Visit: Admit: 2022-06-11 | Discharge: 2022-06-12 | Payer: MEDICARE

## 2022-06-11 DIAGNOSIS — H43813 Vitreous degeneration, bilateral: Principal | ICD-10-CM

## 2022-06-11 DIAGNOSIS — Z961 Presence of intraocular lens: Principal | ICD-10-CM

## 2022-06-11 DIAGNOSIS — Z0100 Encounter for examination of eyes and vision without abnormal findings: Principal | ICD-10-CM

## 2022-06-11 DIAGNOSIS — E119 Type 2 diabetes mellitus without complications: Principal | ICD-10-CM

## 2022-06-11 NOTE — Unmapped (Signed)
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 no complaints.      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     Assessment and plan:     # DM without DR  Hbg A1c: 6.2 (02/19/2022), 5.4 (09/01/2021)  The patient was advised to maintain tight glucose control, tight blood pressure control, and favorable levels of cholesterol.    fu today, stable.     fu one year, OCT, gen oph     #  h/o hemorrhagic PVD  no RD/RD . No new floaters or photopsia   precautions reviewed    # pseudophakia OU.  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  I reviewed the resident???s note.  I agree with the resident???s findings and plan including interpretation of images.

## 2022-06-13 DIAGNOSIS — Z942 Lung transplant status: Principal | ICD-10-CM

## 2022-06-13 DIAGNOSIS — D509 Iron deficiency anemia, unspecified: Principal | ICD-10-CM

## 2022-06-14 ENCOUNTER — Ambulatory Visit: Admit: 2022-06-14 | Discharge: 2022-06-14 | Payer: MEDICARE

## 2022-06-14 ENCOUNTER — Encounter: Admit: 2022-06-14 | Discharge: 2022-06-14 | Payer: MEDICARE | Attending: Internal Medicine | Primary: Internal Medicine

## 2022-06-14 LAB — PHOSPHORUS: PHOSPHORUS: 2.3 mg/dL — ABNORMAL LOW (ref 2.4–5.1)

## 2022-06-14 LAB — CBC W/ AUTO DIFF
BASOPHILS ABSOLUTE COUNT: 0 10*9/L (ref 0.0–0.1)
BASOPHILS RELATIVE PERCENT: 0.5 %
EOSINOPHILS ABSOLUTE COUNT: 0 10*9/L (ref 0.0–0.5)
EOSINOPHILS RELATIVE PERCENT: 0.6 %
HEMATOCRIT: 38.5 % — ABNORMAL LOW (ref 39.0–48.0)
HEMOGLOBIN: 13.4 g/dL (ref 12.9–16.5)
LYMPHOCYTES ABSOLUTE COUNT: 0.4 10*9/L — ABNORMAL LOW (ref 1.1–3.6)
LYMPHOCYTES RELATIVE PERCENT: 10.2 %
MEAN CORPUSCULAR HEMOGLOBIN CONC: 34.9 g/dL (ref 32.0–36.0)
MEAN CORPUSCULAR HEMOGLOBIN: 33 pg — ABNORMAL HIGH (ref 25.9–32.4)
MEAN CORPUSCULAR VOLUME: 94.6 fL (ref 77.6–95.7)
MEAN PLATELET VOLUME: 7.4 fL (ref 6.8–10.7)
MONOCYTES ABSOLUTE COUNT: 0.3 10*9/L (ref 0.3–0.8)
MONOCYTES RELATIVE PERCENT: 7.7 %
NEUTROPHILS ABSOLUTE COUNT: 3.4 10*9/L (ref 1.8–7.8)
NEUTROPHILS RELATIVE PERCENT: 81 %
PLATELET COUNT: 150 10*9/L (ref 150–450)
RED BLOOD CELL COUNT: 4.06 10*12/L — ABNORMAL LOW (ref 4.26–5.60)
RED CELL DISTRIBUTION WIDTH: 14.9 % (ref 12.2–15.2)
WBC ADJUSTED: 4.2 10*9/L (ref 3.6–11.2)

## 2022-06-14 LAB — COMPREHENSIVE METABOLIC PANEL
ALBUMIN: 4.1 g/dL (ref 3.4–5.0)
ALKALINE PHOSPHATASE: 107 U/L (ref 46–116)
ALT (SGPT): 60 U/L — ABNORMAL HIGH (ref 10–49)
ANION GAP: 7 mmol/L (ref 5–14)
AST (SGOT): 49 U/L — ABNORMAL HIGH (ref <=34)
BILIRUBIN TOTAL: 0.5 mg/dL (ref 0.3–1.2)
BLOOD UREA NITROGEN: 33 mg/dL — ABNORMAL HIGH (ref 9–23)
BUN / CREAT RATIO: 22
CALCIUM: 9.9 mg/dL (ref 8.7–10.4)
CHLORIDE: 107 mmol/L (ref 98–107)
CO2: 28 mmol/L (ref 20.0–31.0)
CREATININE: 1.5 mg/dL — ABNORMAL HIGH
EGFR CKD-EPI (2021) MALE: 49 mL/min/{1.73_m2} — ABNORMAL LOW (ref >=60)
GLUCOSE RANDOM: 112 mg/dL — ABNORMAL HIGH (ref 70–99)
POTASSIUM: 4.2 mmol/L (ref 3.4–4.8)
PROTEIN TOTAL: 7 g/dL (ref 5.7–8.2)
SODIUM: 142 mmol/L (ref 135–145)

## 2022-06-14 LAB — IGG: GAMMAGLOBULIN; IGG: 864 mg/dL (ref 650–1600)

## 2022-06-14 LAB — MAGNESIUM: MAGNESIUM: 1.5 mg/dL — ABNORMAL LOW (ref 1.6–2.6)

## 2022-06-14 LAB — TACROLIMUS LEVEL, TROUGH: TACROLIMUS, TROUGH: 7.6 ng/mL (ref 5.0–15.0)

## 2022-06-14 NOTE — Unmapped (Signed)
Called Spencer Mallet Sr. to discuss lab results. Tacrolimus level was normal at 7.6 with a goal of 5-7. Medication regimen is currently 0.5 mg BID. Dosing at this time to remain the same. Repeat labs scheduled one month. Resulted labs reviewed. All questions answered. Pt verbalized understanding.

## 2022-06-15 LAB — EBV QUANTITATIVE PCR, BLOOD: EBV VIRAL LOAD RESULT: NOT DETECTED

## 2022-06-15 LAB — CMV DNA, QUANTITATIVE, PCR: CMV VIRAL LD: NOT DETECTED

## 2022-06-21 LAB — HLA DS POST TRANSPLANT
ANTI-DONOR DRW #1 MFI: 0 MFI
ANTI-DONOR DRW #2 MFI: 0 MFI
ANTI-DONOR HLA-A #1 MFI: 2 MFI
ANTI-DONOR HLA-A #2 MFI: 0 MFI
ANTI-DONOR HLA-B #1 MFI: 5 MFI
ANTI-DONOR HLA-B #2 MFI: 0 MFI
ANTI-DONOR HLA-C #2 MFI: 0 MFI
ANTI-DONOR HLA-DP AG #1 MFI: 20 MFI
ANTI-DONOR HLA-DQB #1 MFI: 0 MFI
ANTI-DONOR HLA-DQB #2 MFI: 0 MFI
ANTI-DONOR HLA-DR #1 MFI: 1 MFI
ANTI-DONOR HLA-DR #2 MFI: 0 MFI

## 2022-06-21 LAB — FSAB CLASS 2 ANTIBODY SPECIFICITY: HLA CL2 AB RESULT: POSITIVE

## 2022-06-21 LAB — FSAB CLASS 1 ANTIBODY SPECIFICITY: HLA CLASS 1 ANTIBODY RESULT: NEGATIVE

## 2022-06-25 DIAGNOSIS — B259 Cytomegaloviral disease, unspecified: Principal | ICD-10-CM

## 2022-06-25 MED ORDER — VALGANCICLOVIR 450 MG TABLET
ORAL_TABLET | Freq: Every day | ORAL | 11 refills | 30 days | Status: CP
Start: 2022-06-25 — End: ?
  Filled 2022-07-23: qty 30, 30d supply, fill #0

## 2022-06-25 NOTE — Unmapped (Signed)
Pt request for RX Refill

## 2022-06-28 NOTE — Unmapped (Signed)
Incoming call from [employer] Sr.Marland Kitchen  He slipped off ladder and has abrasion on both legs that don't see me to be healing. Will add to clinic tomorrow morning. Questions answered, verbalized understanding.

## 2022-06-29 ENCOUNTER — Ambulatory Visit: Admit: 2022-06-29 | Discharge: 2022-06-30 | Payer: MEDICARE | Attending: Family | Primary: Family

## 2022-06-29 DIAGNOSIS — W11XXXA Fall on and from ladder, initial encounter: Principal | ICD-10-CM

## 2022-06-29 DIAGNOSIS — S80811A Abrasion, right lower leg, initial encounter: Principal | ICD-10-CM

## 2022-06-29 DIAGNOSIS — Z942 Lung transplant status: Principal | ICD-10-CM

## 2022-06-29 DIAGNOSIS — S80812A Abrasion, left lower leg, initial encounter: Principal | ICD-10-CM

## 2022-06-29 NOTE — Unmapped (Signed)
Pulmonary Transplant Clinic    HISTORY:   HPI:  Spencer Pena is a 72 y.o.  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, also with type 2 diabetes mellitus, hypertension, colon cancer in 2007 s/p partial colon and hepatic wedge resection and FOLFOX here for post transplant care    - Feeling well overall  - Here for non-healing wounds  - Slipped off of a ladder on 02/12 and has abrasions that don't seem to be healing  - Initially cleaned it with peroxide and has been putting neosporin on it and keeping it covered with a bandage  - Does feel like the wounds are getting smaller.  - Denies spreading redness, purulent drainage  - Denies fevers, chills  - Denies cough, SOB, sputum production  - Denies reflux/heartburn  - No LE edema  - Home BPs stable  - Blood sugars ok  - Urinary symptoms are the same, sees urology this Friday.  - Notes stool gets watery every 7 days or so and then improves, then gets harder.  - Exercising as usual.      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     --> basiliximab induction    --> CMV high risk (D+/R-)    --> EBV moderate risk (D+/R+)    --> Not Increased Risk Donor  - Colon Cancer 2007    --> s/p resection of liver mets    --> s/p resection of 6 inches of colon    --> FOLFOX Chemo  - type 2 diabetes mellitus     --> not insulin dependent  - OA    SOCIAL HISTORY:   - 20pk years, quit in 1991  - previously drank about 10 drinks per week   - Worked maintenance at [employer]  - Married and lives in [place]    FAMILY HISTORY:   - Parents lived into late 90's    MEDS: (personally reviewed in [place], pertinent meds noted below)     Current Outpatient Medications on File Prior to Visit   Medication Sig Dispense Refill    acetaminophen (TYLENOL EXTRA STRENGTH) 500 MG tablet Take 1-2 tablets (500-1,000 mg total) by mouth every six (6) hours as needed for pain. 180 tablet PRN    aspirin (ECOTRIN) 81 MG tablet Take 1 tablet (81 mg total) by mouth daily. 90 tablet 3    atorvastatin (LIPITOR) 40 MG tablet Take 1 tablet (40 mg total) by mouth daily. 90 tablet 3    azathioprine (IMURAN) 50 mg tablet Take 1 tablet (50 mg total) by mouth daily. 90 tablet 3    azithromycin (ZITHROMAX) 250 MG tablet Take 1 tablet (250 mg total) by mouth daily. 30 tablet 11    blood sugar diagnostic (ON CALL EXPRESS TEST STRIP) Strp Test daily before all meals/snacks and once before bedtime. 100 each 11    calcium carbonate (CALCIUM 600) 1,500 mg (600 mg elem calcium) tablet Take 1 tablet (600 mg of elem calcium total) by mouth Two (2) times a day. 60 tablet 11    cholecalciferol, vitamin D3 25 mcg, 1,000 units,, 1,000 unit (25 mcg) tablet Take 2 tablets (50 mcg total) by mouth daily. 100 tablet 11    fluticasone propion-salmeteroL (ADVAIR HFA) 115-21 mcg/actuation inhaler Inhale 2 puffs Two (2) times a day. 12 g 11    fluticasone propionate (FLONASE) 50 mcg/actuation nasal spray Use 2 sprays into each nostril daily. 16 g 11  magnesium oxide (MAG-OX) 400 mg (241.3 mg elemental magnesium) tablet Take 3 tablets (1,200 mg total) by mouth Two (2) times a day. 180 tablet 11    metFORMIN (GLUCOPHAGE) 1000 MG tablet Take 1 tablet (1,000 mg total) by mouth Two (2) times a day. 180 tablet 3    montelukast (SINGULAIR) 10 mg tablet Take 1 tablet (10 mg total) by mouth daily. 30 tablet 2    pantoprazole (PROTONIX) 40 MG tablet Take 1 tablet (40 mg total) by mouth daily. 90 tablet 3    predniSONE (DELTASONE) 5 MG tablet Take 1.5 tablets (7.5 mg total) by mouth daily. 135 tablet 3    SITagliptin phosphate (JANUVIA) 100 MG tablet Take 1 tablet (100 mg total) by mouth daily. 90 tablet 3    sulfamethoxazole-trimethoprim (BACTRIM) 400-80 mg per tablet Take 1 tablet (80 mg of trimethoprim total) by mouth Every Monday, Wednesday, and Friday. 36 tablet 3    tacrolimus (PROGRAF) 0.5 MG capsule Take 1 capsule (0.5 mg total) by mouth two (2) times a day. 180 capsule 3    terazosin (HYTRIN) 1 MG capsule Take 3 capsules (3 mg total) by mouth nightly. 90 capsule 11    valGANciclovir (VALCYTE) 450 mg tablet Take 1 tablet (450 mg total) by mouth daily. 30 tablet 11     No current facility-administered medications on file prior to visit.       OBJECTIVE DATA:   PHYSICAL EXAM:  BP 134/75 (BP Site: L Arm, BP Position: Sitting, BP Cuff Size: Large)  - Pulse 83  - Temp 36.8 ??C (98.2 ??F) (Temporal)  - Wt 74.9 kg (165 lb 1.6 oz)  - SpO2 97%  - BMI 25.86 kg/m??      Gen: - awake, alert, in NAD  Derm: - Small approx 3cm x1cm abrasions with surrounding bruising/erythema noted to bilateral shins.  Healthy granulation tissue noted.  No purulent drainage observed.  Vascular: - good pulses throughout  CV: - RRR, no m/r/g  Pulm: - CTAB, good air movement bilaterally   Abd: - soft, NT, ND, no hepatosplenomegaly  Ext: - no edema of LE  Neuro: - Alert, oriented, follows commands. Moving all extremities spontaneously    LABS: (reviewed in Epic, pertinent values noted below)   PFTs: (personally reviewed and interpereted):    Date: FVC (% Pred) FEV1 (% Pred) FEF25-75(% Pred) DLCO TBBx/Results           05/28/22 4.16 (121.5%) 3.69 (141.4%) 3.23 (307.1%)     02/19/2022 4.03 (110.4) 3.52 (126.7) 5.19 (241.3)     11/17/21 4.13 (113%) 3.63 (130%)      08/18/21 4.01 (109%) 3.55 (127%)      05/15/2021 4.21(114%) 3.65 (130.3%) 5.54 (254%)     04/06/2021 4.08 (111%) 3.64 (129.6%) 6.01 (274.3%)     12/31/20 3.95 (107.1%) 3.46 (122.6%) 5.20 (236.4%)     12/03/2020 3.87 (104.9%) 3.47 (122.9%) 5.69 (258.3%)     10/03/2020 3.68 (99.4) 3.13 (110.6)  4.22 (190.6)      09/05/20  3.70  3.14      08/20/20 3.65 (98%) 3.15 (111%) 4.69 (211%)      08/01/20 3.64 (98.2%) 3.10 (109.3%) 4.41 (198.8%)     07/18/20 3.49 (94%) 2.98 (105%) 4.20 (189%)     07/04/20 3.43 (92.5%) 2.97 (104.7%) 4.26 (191.7%)     06/27/2020 3.13 84.5 2.70 95.4 3.51 157.8     06/20/20 3.39 (91.4%) 2.82 (99.2%) 3.34 (150.35)     06/13/20 3.10 (72.8%) 2.66 (82.5%) 3.34 (136.2%)  05/15/20      BOLT 03/11/20  2.75 (72%)  2.36 (81%)       02/12/20  2.85 (75%)  2.44 (86%)                IMAGING: Reviewed in EMR, CXR with broken sternal wires, otherwise stable    ASSESSMENT and PLAN     Spencer Pena is a 72 y.o. man with IPF and emphysema s/p BOLT on 05/15/2020, also with type 2 diabetes mellitus, hypertension, colon cancer in 2007 s/p partial colon and hepatic wedge resection and FOLFOX here for follow up    Shin abrasions:  - Appear to be healing slowly, but well.  - No purulent drainage, spreading redness, fevers/chills or other s/s of infection  - Advised that he cover at night so abrasions can dry and close up  - Continue to use neosporin and cover with bandages during the day.    Graft Function and Immunosuppression:  - PFTs and CXR deferred today but patient has been feeling well and testing has been stable.    - HLA status: No DSAs, checked monthly, last 06/2022  - Total IgG <700 but insurance will not approve IVIg  - Immunosuppression:   - Continue tac goal 5-7. Currently taking 0.5mg  BID  - AZA 50 mg daily; dropped from 100 mg 08/01/20  -Stopped MMF 06/13/20 (GI upset)  - Prednisone 7.5mg  daily  - BOS PPX  - Azithromycin 250mg  daily  - ASA 81mg  daily  - Continue atorvastatin 02/2022  - Continue Advair BID  - Montelukast 10mg  daily    Antimicrobial Prophylaxis:  - CMV, high risk (D+/R-): valganciclovir 450mg  daily indefinitely for Estimated Creatinine Clearance: 42.2 mL/min (A) (based on SCr of 1.5 mg/dL (H)). Negative VL 12/29/2021. Plan for repeat T cell assay at 2 years.  Should get with next labs, order entered.  - EBV (D+/R+): negative 12/29/21  - Fungal: Posaconazole stopped at 86mo  - PCP: Bactrim SS MWF    Pancytopenia: Mild and stable over one year. Monitoring on current immunosuppressive regimen for now     Broken sternal wires  - noted on CXR in clinic 02/2022  - asymptomatic    Skin Bruising - start biotin and CoQ10. Likely related to prednisone and aspirin.    Pain control - previously required gabapentin, now only on PRN acetaminophen.    OSA   - STOP-BANG 4 points (male, age, observed apnea, snoring).  - PSG 12/29/2020   CPAP at level of 10 cm H2O with expiratory pressure relief (EPR) = 3 and with heated humidifier for nasal dryness, mask: AirFit N20 size medium or mask of patient???s preference.  The patient needs to follow up for CPAP compliance 31 to 90 days after initiation of CPAP therapy to ensure compliance of at least 4 hours per night for more than 70% of nights.  - Not using CPAP d/t discomfort. Talking to home health company.     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  - pH probe with DeMeester 16.7%, slightly in abnormal range but will hold off on surgical intervention at this time in the setting of normal graft function.  - Daily PPI     BPH - terazosin  3mg  at bedtime, continues with nocturia  - UA completed at prior appt, benign.  - Normal CEA and PSA  - Referred to urology and has upcoming appt    CKD IIIa - monitoring    Iron Deficiency Anemia - received IV iron 05/22/20, ferritin 12.5 08/2021, continues to be low    Hypomagnesemia - magnesium oxide 1200 mg BID     Type 2 diabetes mellitus   - Lantus will drop to 12 units in the nightly with pred adjustment  - aspart 7/7/3 with pred adjustment  - off insulin now  - metformin 1000mg  BID  - Januvia  - sugars well controlled  - PVL ABI and carotid duplex normal 11/2021    Bone Health: Last Dexa: osteopenia on DEXA 11/17/2021  - on Ca and Vit D supplementation currently    Health Maintenance:  - Last derm visit:  12/2021  - Last Colonsocopy (if >40): 12/2021  - Last flu shot:   02/19/2022; needs repeat 05/28/22  - RSV vaccine:   Completed locally 06/2022  - Last Pneumovax:  06/10/2004, 12/30/2017  - Last Prevnar:  03/25/2016  - TdaP:   02/11/2018  - Shingrix:    03/10/20, 08/20/20  - COVID-19 (Moderna) 06/11/19, 07/09/19, 03/10/20, 07/18/2020, 12/03/20 received 1/2 dose Evushield,03/2021 (Bivalent); SpikeVax 02/19/2022  - Last Dexa:   02/2020, 11/17/21  - Last vitamin D:  66.9 05/2021  - Last HbA1c:   6.2 (02/2022)    Immunization History   Administered Date(s) Administered    COVID-19 VAC,BIVALENT(45YR UP),PFIZER 08/18/2021    COVID-19 VACCINE,MRNA(MODERNA)(PF) 06/11/2019, 07/09/2019, 03/10/2020, 07/18/2020, 12/03/2020    Covid-19 Vac, (24yr+) (Spikevax) Monovalent Xbb.1.5 Moder  02/19/2022    HEPATITIS B VACCINE ADULT, ADJUVANTED, IM(HEPLISAV B) 03/12/2020    INFLUENZA INJ MDCK PF, QUAD,(FLUCELVAX)(76MO AND UP EGG FREE) 02/08/2020    INFLUENZA QUAD ADJUVANTED 69YR UP(FLUAD) 05/28/2022    INFLUENZA TIV (TRI) PF (IM) 02/27/2010    Influenza Vaccine Quad(IM)6 MO-Adult(PF) 01/19/2019, 04/06/2021, 02/19/2022    Influenza Virus Vaccine, unspecified formulation 02/07/2013, 02/28/2015, 02/05/2016, 02/02/2017, 01/27/2018    PNEUMOCOCCAL POLYSACCHARIDE 23-VALENT 06/10/2004, 12/30/2017    Pneumococcal Conjugate 13-Valent 03/25/2016    SHINGRIX-ZOSTER VACCINE (HZV),RECOMBINANT,ADJUVANTED(IM) 03/10/2020, 08/20/2020    TdaP 02/15/2012, 02/11/2018     >42min was spent with the patient face to face and >36min was spent reviewing chart/imaging.     Complex medical decision making was done as we adjust medications based on blood work/drug levels and multiple complaints and problems were addressed    The patient was assessed and discussed with Dr. Dudley Major who is in agreement with plan.    Sharee Pimple MSN, FNP-C  June 29, 2022 10:29 AM  Lung Transplant Nurse Practitioner  Pager [phone]

## 2022-07-02 ENCOUNTER — Ambulatory Visit: Admit: 2022-07-02 | Discharge: 2022-07-03 | Payer: MEDICARE

## 2022-07-02 DIAGNOSIS — N401 Enlarged prostate with lower urinary tract symptoms: Principal | ICD-10-CM

## 2022-07-02 DIAGNOSIS — R3914 Feeling of incomplete bladder emptying: Principal | ICD-10-CM

## 2022-07-02 DIAGNOSIS — R350 Frequency of micturition: Principal | ICD-10-CM

## 2022-07-02 NOTE — Unmapped (Addendum)
UROLOGY CLINIC NOTE     Patient Name: Spencer MCNEMAR Sr.  Patient Age: 72 y.o.  Encounter Date: 07/02/2022    Referring Provider:   Darvin Neighbours, FNP  [address]  [employer] [place],  [place] [address]    PCP: Sharlee Blew, MD    Reason for Visit: urinary frequency    Assessment  72 y.o. male with a history of CKD Stage 3, status post lung transplant,  sleep apnea, and Type 2 diabetes on metformin  Lower urinary tract symptoms (LUTS) suggestive of BPH, on terazosin 3mg  a night.  Symptoms mixed irritative and obstructive, most bothered by irritative.  IPSS score of 16 with a bother of 3    We reviewed causes of LUTS: abnormalities of the prostate/bladder/urethra, infections, neurologic disease, obstruction and detrusor over/under activity. We discussed the role of medications, bladder irritants, fluid intake, constipation, edema, and sleep apnea. His postvoid residual in clinic is 33ml.  We discussed the importance of behavioral modifications and utility of medication therapy (alpha blockers, alpha reductase inhibitors, and/or low dose tadalafil and their respective side effects) to help with bladder emptying.    Additionally discussed utility of combination therapy with anticholinergics or beta-3 agonists and their respective side effects for irritative symptoms. Patient not interested at this time.     We discussed further evaluation including a local cystoscopy procedure and transrectal ultrasound of the prostate for prostate volume to determine if more advance treatments may be useful.        Plan:  - Continue terazosin. Discussed this is to help with his obstructive symptoms.  - Recommended timed voiding, double voiding, shifting fluid intake to earlier, and minimizing high diuretic-type fluids (tea, coffee, alcohol).  - Continue to manage diabetes with medications.  - Avoid constipation. Increase fiber and take Miralax as needed   - Strongly recommend he wear a CPAP or mini-PAP for his sleep apnea.    Follow-up in 12 months, or sooner if any problems arise.      HPI:   Spencer Nevills Sr. is a 72 y.o. male with a history of CKD Stage 3, status post lung transplant, sleep apnea, and Type 2 diabetes on metformin.    He was last seen by Dr. Aundria Rud in 2022 for a circumcision.    LUTS suggestive of BPH  Experiencing symptoms since his lung transplant in 2022. Suspects is related to medications.  Most notable symptoms include incomplete emptying, frequency  Most bothered by frequency and nocturia 2-3x a night. Says symptoms vary with fluid intake (tea, coffee, beer)    IPSS  Incomplete Emptying: 3  Frequency: 4  Intermittency: 3  Urgency: 2  Weak stream: 2  Straining: 0  Nocturia: 2, depends on fluid intake  Total: 16  (0-7 mild, 8-19 moderate, 20-35 severe)    QOL: 3  Leakage: 1, very mild    He denies retention, gross hematuria, dysuria, incontinence, or UTIs.  Constipation: Yes, on occasion  Alcohol: 4 beers a week  Intake: Water, tea, soda. Notes he is very hydrated throughout the day.  Diabetes: Yes, on metformin  Has OSA, not wearing CPAP due to discomfort. Is considering a mini-PAP.    Today he is doing well and denies fevers, chills, chest pain, shortness of breath, cough, wheezing, nausea, vomiting, abdominal pain, or flank pain.      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/31/2020    Medical history reviewed with no changes 05/22/2018    per pt       Past Surgical History:  Past Surgical History:   Procedure Laterality Date    CARDIAC SURGERY      cardiac cath    CATARACT EXTRACTION Left 2011    CATARACT EXTRACTION Right 05/25/2018    PC/IOL    CHG [place], CHEST,REAL TIME Bilateral 06/20/2020    Procedure: ULTRASOUND, CHEST, REAL TIME WITH IMAGE DOCUMENTATION;  Surgeon: Anderson Malta, MD;  Location: [facility] PROCEDURE LAB [facility];  Service: Pulmonary    COLECTOMY      COLON SURGERY  2006    EYE SURGERY Bilateral 2011; 2018    cataract surgery     LIVER SURGERY  2007    PR BRONCHOSCOPY,DIAGNOSTIC N/A 05/15/2020    Procedure: BRONCHOSCOPY, RIGID OR FLEXIBLE, W/WO FLUOROSCOPIC GUIDANCE; DIAGNOSTIC, WITH CELL WASHING, WHEN PERFORMED;  Surgeon: Evert Kohl, MD;  Location: MAIN OR [place];  Service: Thoracic    PR CATH PLACE/CORON ANGIO, IMG SUPER/INTERP,R&L HRT CATH, L HRT VENTRIC N/A 03/04/2020    Procedure: Left/Right Heart Catheterization;  Surgeon: Lesle Reek, MD;  [facility];  Service: Cardiology    PR COLONOSCOPY FLX DX W/COLLJ SPEC WHEN PFRMD N/A 12/28/2016    Procedure: COLONOSCOPY, FLEXIBLE, PROXIMAL TO SPLENIC FLEXURE; DIAGNOSTIC, W/WO COLLECTION SPECIMEN BY BRUSH OR WASH;  Surgeon: Alfred Levins, MD;  Location: [facility] PROCEDURES [facility];  Service: Gastroenterology    PR COLSC FLX W/RMVL OF TUMOR POLYP LESION SNARE TQ N/A 12/11/2021    Procedure: COLONOSCOPY FLEX; W/REMOV TUMOR/LES BY SNARE;  Surgeon: Janace Aris, MD;  [facility];  Service: Gastroenterology    PR ESOPHAGEAL MOTILITY STUDY, MANOMETRY N/A 07/23/2020    Procedure: ESOPHAGEAL MOTILITY STUDY W/INT & REP;  Surgeon: Nurse-Based Giproc;  [facility]: [facility];  Service: Gastroenterology    PR GERD TST W/ NASAL PH ELECTROD N/A 07/23/2020    Procedure: ESOPHAGEAL 24 HOUR PH MONITORING;  Surgeon: Nurse-Based Giproc;  Location: [facility] PROCEDURES [facility];  Service: Gastroenterology    PR LUNG TRANSPLANT,DBL W CP BYPASS Bilateral 05/15/2020    Procedure: LUNG TRANSPL DBL; Ella Jubilee BYPASS;  Surgeon: Evert Kohl, MD;  Location: MAIN OR [facility]; Service: Thoracic    PR UPPER GI ENDOSCOPY,BIOPSY N/A 06/26/2020    Procedure: UGI ENDOSCOPY; WITH BIOPSY, SINGLE OR MULTIPLE;  Surgeon: Chriss Driver, MD;  [facility]: [facility];  Service: Gastroenterology    PR XCAPSL CTRC RMVL INSJ IO LENS PROSTH W/O ECP Right 05/25/2018    Procedure: EXTRACAPSULAR CATARACT REMOVAL W/INSERTION OF INTRAOCULAR LENS PROSTHESIS, MANUAL OR MECHANICAL TECHNIQUE;  Surgeon: Arville Care, MD;  Location: [facility] OR [facility];  Service: Ophthalmology        Medications:  Current Outpatient Medications   Medication Sig Dispense Refill    acetaminophen (TYLENOL EXTRA STRENGTH) 500 MG tablet Take 1-2 tablets (500-1,000 mg total) by mouth  every six (6) hours as needed for pain. 180 tablet PRN    aspirin (ECOTRIN) 81 MG tablet Take 1 tablet (81 mg total) by mouth daily. 90 tablet 3    atorvastatin (LIPITOR) 40 MG tablet Take 1 tablet (40 mg total) by mouth daily. 90 tablet 3    azathioprine (IMURAN) 50 mg tablet Take 1 tablet (50 mg total) by mouth daily. 90 tablet 3    azithromycin (ZITHROMAX) 250 MG tablet Take 1 tablet (250 mg total) by mouth daily. 30 tablet 11    blood sugar diagnostic (ON CALL EXPRESS TEST STRIP) Strp Test daily before all meals/snacks and once before bedtime. 100 each 11    calcium carbonate (CALCIUM 600) 1,500 mg (600 mg elem calcium) tablet Take 1 tablet (600 mg of elem calcium total) by mouth Two (2) times a day. 60 tablet 11    cholecalciferol, vitamin D3 25 mcg, 1,000 units,, 1,000 unit (25 mcg) tablet Take 2 tablets (50 mcg total) by mouth daily. 100 tablet 11    fluticasone propion-salmeteroL (ADVAIR HFA) 115-21 mcg/actuation inhaler Inhale 2 puffs Two (2) times a day. 12 g 11    fluticasone propionate (FLONASE) 50 mcg/actuation nasal spray Use 2 sprays into each nostril daily. 16 g 11    magnesium oxide (MAG-OX) 400 mg (241.3 mg elemental magnesium) tablet Take 3 tablets (1,200 mg total) by mouth Two (2) times a day. 180 tablet 11 metFORMIN (GLUCOPHAGE) 1000 MG tablet Take 1 tablet (1,000 mg total) by mouth Two (2) times a day. 180 tablet 3    montelukast (SINGULAIR) 10 mg tablet Take 1 tablet (10 mg total) by mouth daily. 30 tablet 2    pantoprazole (PROTONIX) 40 MG tablet Take 1 tablet (40 mg total) by mouth daily. 90 tablet 3    predniSONE (DELTASONE) 5 MG tablet Take 1.5 tablets (7.5 mg total) by mouth daily. 135 tablet 3    SITagliptin phosphate (JANUVIA) 100 MG tablet Take 1 tablet (100 mg total) by mouth daily. 90 tablet 3    sulfamethoxazole-trimethoprim (BACTRIM) 400-80 mg per tablet Take 1 tablet (80 mg of trimethoprim total) by mouth Every Monday, Wednesday, and Friday. 36 tablet 3    tacrolimus (PROGRAF) 0.5 MG capsule Take 1 capsule (0.5 mg total) by mouth two (2) times a day. 180 capsule 3    terazosin (HYTRIN) 1 MG capsule Take 3 capsules (3 mg total) by mouth nightly. 90 capsule 11    valGANciclovir (VALCYTE) 450 mg tablet Take 1 tablet (450 mg total) by mouth daily. 30 tablet 11     No current facility-administered medications for this visit.       Allergies:  Cetuximab, Nsaids (non-steroidal anti-inflammatory drug), Venom-honey bee, Enalapril, and Pollen extracts     Social History:  Patient  reports that he quit smoking about 32 years ago. His smoking use included cigarettes. He started smoking about 57 years ago. He has a 36.4 pack-year smoking history. He has never been exposed to tobacco smoke. He quit smokeless tobacco use about 32 years ago.  His smokeless tobacco use included chew. He reports that he does not currently use alcohol. He reports that he does not use drugs.     Family History:  The patient's family history includes Cancer in his father; Diabetes in his mother; No Known Problems in his brother, maternal aunt, maternal grandfather, maternal grandmother, maternal uncle, paternal aunt, paternal grandfather, paternal grandmother, paternal uncle, and sister.     ROS:   As per HPI.  The patient was asked to review all abnormal responses not pertinent to today's visit with their primary care provider.     Vitals  BP 121/57 (BP Site: R Arm, BP Position: Sitting, BP Cuff Size: Large)  - Pulse 90  - Temp 36 ??C (96.8 ??F) (Temporal)     Physical Exam:  GENERAL: The patient is a pleasant male in no acute distress.   HEENT: Normocephalic and atraumatic.   NECK: Supple with trachea midline.   LYMPHATICS: No cervical or supraclavicular lymphadenopathy.   PULMONARY: Relaxed respiratory effort on room air.   CARDIOVASCULAR: Regular rate   GASTROINTESTINAL: Soft, nontender and nondistended  GENITOURINARY: Discussed limitations and utlity of DRE. DRE declined today  SKIN: No signs of cyanosis or clubbing.   NEUROLOGICAL: Grossly intact.   PSYCH: Alert and oriented x 3.    Labs Reviewed:  Lab Results   Component Value Date    WBC 4.2 06/14/2022    HGB 13.4 06/14/2022    HCT 38.5 (L) 06/14/2022    PLT 150 06/14/2022       Lab Results   Component Value Date    NA 142 06/14/2022    K 4.2 06/14/2022    CL 107 06/14/2022    CO2 28.0 06/14/2022    BUN 33 (H) 06/14/2022    CREATININE 1.50 (H) 06/14/2022    CALCIUM 9.9 06/14/2022    MG 1.5 (L) 06/14/2022    PHOS 2.3 (L) 06/14/2022       Lab Results   Component Value Date/Time    PSA 1.21 05/28/2022 07:29 AM    PSA 1.27 05/15/2021 07:19 AM    PSA 0.99 02/12/2020 03:42 PM    PSA 0.87 07/23/2019 01:39 PM    PSA 0.83 01/19/2019 11:54 AM    PSA 0.63 12/30/2017 10:48 AM    PSA 0.58 11/01/2016 04:41 PM    PSA 0.50 12/19/2015 02:49 PM     02/19/22 A1C: 6.2    07/02/22 AUA symptom score total of 16 with bother of 3  07/02/22 postvoid residual 33 ml      I personally spent 30 minutes face-to-face and non-face-to-face in the care of this patient, which includes all pre, intra, and post visit time on the date of service.     Everlene Other, DNP, FNP-C  Family Nurse Practitioner  [facility] Urology

## 2022-07-02 NOTE — Unmapped (Signed)
Addended by: Zoe Lan on: 07/02/2022 10:26 AM     Modules accepted: Level of Service

## 2022-07-02 NOTE — Unmapped (Addendum)
Wear a CPAP or mini-PAP if tolerated.    Behavioral change recommendations:  1.  Timed voiding every 2-3 hours by the clock and not on sensation alone  2.  Double voiding-trying to urinate again shortly after going to see if you can empty more  3.  Continue good bowel regimen to minimize constipation episodes  4.  Shift fluid intake earlier in the day and less in the evening hours and at night to decrease nighttime voiding  5.  Minimize high diuretic-type fluids, such as tea, soda, alcohol, coffee, acidic foods, like citrus fruits and tomatoes, and artifical sweeteners. These will increase urinary frequency    6. Avoid constipation. Increase fiber and take Miralax as needed          NOTICE: Many test results will be automatically released into MyChart. This may happen before your provider has a chance to review them. Your results will either be reviewed with you at your scheduled appointment or your provider or someone from their office will reach out to you to discuss the results within 48-72 hours. Thank you for your understanding     If you have been prescribed a medication today, always read the package insert that comes with the medication.     In the event of an emergency, always call 911.    MyChart is for non-urgent issues only and is intermittently monitored by your provider. Messages are first triaged by clinic staff. Please allow 3 business days for a reply to any MyChart messages.    *To contact the Urology Clinic during business hours, call [phone].     [facility] Operator can be reached at [phone] if you need to get in contact with your doctor.  After the hours, the operator can page the doctor on call for urgent concerns: Urology Patients should ask for the Urology resident ???on call???. You can get more immediate assistance at the Emergency Room or Urgent Care if necessary.   Fax # [phone]    Everlene Other, DNP, FNP-C  Family Nurse Practitioner  [facility] Urology

## 2022-07-07 NOTE — Unmapped (Signed)
Spencer Mallet Sr. has been contacted in regards to their refill of valganciclovir. At this time, they have declined refill due to  pt requested a call back in 2 weeks  . Refill assessment call date has been updated per the patient's request.

## 2022-07-11 DIAGNOSIS — D509 Iron deficiency anemia, unspecified: Principal | ICD-10-CM

## 2022-07-11 DIAGNOSIS — Z942 Lung transplant status: Principal | ICD-10-CM

## 2022-07-11 MED FILL — ADVAIR HFA 115 MCG-21 MCG/ACTUATION AEROSOL INHALER: RESPIRATORY_TRACT | 30 days supply | Qty: 12 | Fill #2

## 2022-07-11 MED FILL — SULFAMETHOXAZOLE 400 MG-TRIMETHOPRIM 80 MG TABLET: ORAL | 84 days supply | Qty: 36 | Fill #2

## 2022-07-12 DIAGNOSIS — R197 Diarrhea, unspecified: Principal | ICD-10-CM

## 2022-07-13 ENCOUNTER — Ambulatory Visit: Admit: 2022-07-13 | Discharge: 2022-07-14 | Payer: MEDICARE

## 2022-07-13 LAB — CBC W/ AUTO DIFF
BASOPHILS ABSOLUTE COUNT: 0 10*9/L (ref 0.0–0.1)
BASOPHILS RELATIVE PERCENT: 0.5 %
EOSINOPHILS ABSOLUTE COUNT: 0 10*9/L (ref 0.0–0.5)
EOSINOPHILS RELATIVE PERCENT: 0.9 %
HEMATOCRIT: 35.5 % — ABNORMAL LOW (ref 39.0–48.0)
HEMOGLOBIN: 12.2 g/dL — ABNORMAL LOW (ref 12.9–16.5)
LYMPHOCYTES ABSOLUTE COUNT: 0.5 10*9/L — ABNORMAL LOW (ref 1.1–3.6)
LYMPHOCYTES RELATIVE PERCENT: 11.7 %
MEAN CORPUSCULAR HEMOGLOBIN CONC: 34.3 g/dL (ref 32.0–36.0)
MEAN CORPUSCULAR HEMOGLOBIN: 32.3 pg (ref 25.9–32.4)
MEAN CORPUSCULAR VOLUME: 94.3 fL (ref 77.6–95.7)
MEAN PLATELET VOLUME: 7.6 fL (ref 6.8–10.7)
MONOCYTES ABSOLUTE COUNT: 0.3 10*9/L (ref 0.3–0.8)
MONOCYTES RELATIVE PERCENT: 7 %
NEUTROPHILS ABSOLUTE COUNT: 3.2 10*9/L (ref 1.8–7.8)
NEUTROPHILS RELATIVE PERCENT: 79.9 %
PLATELET COUNT: 161 10*9/L (ref 150–450)
RED BLOOD CELL COUNT: 3.76 10*12/L — ABNORMAL LOW (ref 4.26–5.60)
RED CELL DISTRIBUTION WIDTH: 14.8 % (ref 12.2–15.2)
WBC ADJUSTED: 4 10*9/L (ref 3.6–11.2)

## 2022-07-13 LAB — COMPREHENSIVE METABOLIC PANEL
ALBUMIN: 4 g/dL (ref 3.4–5.0)
ALKALINE PHOSPHATASE: 105 U/L (ref 46–116)
ALT (SGPT): 67 U/L — ABNORMAL HIGH (ref 10–49)
ANION GAP: 8 mmol/L (ref 5–14)
AST (SGOT): 57 U/L — ABNORMAL HIGH (ref <=34)
BILIRUBIN TOTAL: 0.6 mg/dL (ref 0.3–1.2)
BLOOD UREA NITROGEN: 41 mg/dL — ABNORMAL HIGH (ref 9–23)
BUN / CREAT RATIO: 25
CALCIUM: 9.6 mg/dL (ref 8.7–10.4)
CHLORIDE: 107 mmol/L (ref 98–107)
CO2: 29 mmol/L (ref 20.0–31.0)
CREATININE: 1.66 mg/dL — ABNORMAL HIGH
EGFR CKD-EPI (2021) MALE: 44 mL/min/{1.73_m2} — ABNORMAL LOW (ref >=60)
GLUCOSE RANDOM: 105 mg/dL — ABNORMAL HIGH (ref 70–99)
POTASSIUM: 4 mmol/L (ref 3.4–4.8)
PROTEIN TOTAL: 6.8 g/dL (ref 5.7–8.2)
SODIUM: 144 mmol/L (ref 135–145)

## 2022-07-13 LAB — CMV DNA, QUANTITATIVE, PCR: CMV VIRAL LD: NOT DETECTED

## 2022-07-13 LAB — PHOSPHORUS: PHOSPHORUS: 2.9 mg/dL (ref 2.4–5.1)

## 2022-07-13 LAB — TACROLIMUS LEVEL, TROUGH: TACROLIMUS, TROUGH: 7.2 ng/mL (ref 5.0–15.0)

## 2022-07-13 LAB — IGG: GAMMAGLOBULIN; IGG: 791 mg/dL (ref 650–1600)

## 2022-07-13 LAB — MAGNESIUM: MAGNESIUM: 1.7 mg/dL (ref 1.6–2.6)

## 2022-07-13 NOTE — Unmapped (Signed)
Called Spencer Mallet Sr. to discuss lab results. Tacrolimus level was normal at 7.2 with a goal of 5-7. Medication regimen is currently 0.5 mg BID. Dosing at this time to remain the same. Repeat labs scheduled one month. Resulted labs reviewed. All questions answered. Pt verbalized understanding.

## 2022-07-14 LAB — DECEASED DONOR CL I&II, LOW RES
DONOR LOW RES DRW #1: 53
DONOR LOW RES DRW #2: 53
DONOR LOW RES HLA A #1: 2
DONOR LOW RES HLA A #2: 31
DONOR LOW RES HLA B #1: 27
DONOR LOW RES HLA B #2: 44
DONOR LOW RES HLA BW #1: 4
DONOR LOW RES HLA C #1: 1
DONOR LOW RES HLA C #2: 5
DONOR LOW RES HLA DQ #1: 8
DONOR LOW RES HLA DQ #2: 9
DONOR LOW RES HLA DR #1: 4
DONOR LOW RES HLA DR #2: 9

## 2022-07-14 LAB — EBV QUANTITATIVE PCR, BLOOD: EBV VIRAL LOAD RESULT: NOT DETECTED

## 2022-07-16 DIAGNOSIS — Z942 Lung transplant status: Principal | ICD-10-CM

## 2022-07-21 NOTE — Unmapped (Signed)
[facility] Refill Coordination Note    Specialty Medication(s) to be Shipped:   Transplant: valgancyclovir 450mg     Other medication(s) to be shipped:  Januvia and  metformin      Spencer Pena Sr., DOB: 1950/06/14  Phone: [phone] (home) [phone] (work)      All above HIPAA information was verified with patient.     Was a Nurse, learning disability used for this call? No    Completed refill call assessment today to schedule patient's medication shipment from the [facility] [phone]).  All relevant notes have been reviewed.     Specialty medication(s) and dose(s) confirmed: Regimen is correct and unchanged.   Changes to medications: Cezar reports no changes at this time.  Changes to insurance: No  New side effects reported not previously addressed with a pharmacist or physician: None reported  Questions for the pharmacist: No    Confirmed patient received a Conservation officer, historic buildings and a Surveyor, mining with first shipment. The patient will receive a drug information handout for each medication shipped and additional FDA Medication Guides as required.       DISEASE/MEDICATION-SPECIFIC INFORMATION        N/A    SPECIALTY MEDICATION ADHERENCE     Medication Adherence    Patient reported X missed doses in the last month: 0  Specialty Medication: valGANciclovir 450 mg  Patient is on additional specialty medications: No  Patient is on more than two specialty medications: No  Any gaps in refill history greater than 2 weeks in the last 3 months: no  Demonstrates understanding of importance of adherence: yes                Were doses missed due to medication being on hold? No    vaganciclovir 450 mg: 7-10  days of medicine on hand       REFERRAL TO PHARMACIST     Referral to the pharmacist: Not needed      [facility]     Shipping address confirmed in Epic.     Delivery Scheduled: Yes, Expected medication delivery date: 07/23/22 .     Medication will be delivered via Same Day Courier to the prescription address in Epic WAM.    Ricci Barker   [facility] Specialty Technician

## 2022-07-23 MED FILL — METFORMIN 1,000 MG TABLET: ORAL | 90 days supply | Qty: 180 | Fill #3

## 2022-07-23 MED FILL — JANUVIA 100 MG TABLET: ORAL | 90 days supply | Qty: 90 | Fill #3

## 2022-08-09 MED ORDER — MONTELUKAST 10 MG TABLET
ORAL_TABLET | Freq: Every day | ORAL | 3 refills | 90 days | Status: CP
Start: 2022-08-09 — End: 2023-08-09
  Filled 2022-08-10: qty 90, 90d supply, fill #0

## 2022-08-09 NOTE — Unmapped (Signed)
[facility] Refill Coordination Note    Specialty Medication(s) to be Shipped:   Infectious Disease: valganciclovir    Other medication(s) to be shipped:  azathioprine, azithromycin, montelukast, terazosin ,acetaminophen, advair     Spencer Pena Sr., DOB: [birth year]  Phone: [phone] (home) [phone] (work)      All above HIPAA information was verified with patient.     Was a Nurse, learning disability used for this call? No    Completed refill call assessment today to schedule patient's medication shipment from the [facility] [phone]).  All relevant notes have been reviewed.     Specialty medication(s) and dose(s) confirmed: Regimen is correct and unchanged.   Changes to medications: Macen reports no changes at this time.  Changes to insurance: No  New side effects reported not previously addressed with a pharmacist or physician: None reported  Questions for the pharmacist: No    Confirmed patient received a Conservation officer, historic buildings and a Surveyor, mining with first shipment. The patient will receive a drug information handout for each medication shipped and additional FDA Medication Guides as required.       DISEASE/MEDICATION-SPECIFIC INFORMATION        N/A    SPECIALTY MEDICATION ADHERENCE     Medication Adherence    Patient reported X missed doses in the last month: 0  Specialty Medication: Valcyclovir 450mg               Were doses missed due to medication being on hold? No    valcganciclovir 450 mg: 10 days of medicine on hand     REFERRAL TO PHARMACIST     Referral to the pharmacist: Not needed      [facility]     Shipping address confirmed in Epic.     Patient was notified of new phone menu : Yes    Delivery Scheduled: Yes, Expected medication delivery date: 08/11/22.  However, Rx request for refills was sent to the provider as there are none remaining.     Medication will be delivered via Next Day Courier to the prescription address in Epic WAM.    Rollen Sox, [facility]   [facility] Specialty Pharmacist

## 2022-08-10 MED FILL — TERAZOSIN 1 MG CAPSULE: ORAL | 30 days supply | Qty: 90 | Fill #9

## 2022-08-10 MED FILL — ADVAIR HFA 115 MCG-21 MCG/ACTUATION AEROSOL INHALER: RESPIRATORY_TRACT | 30 days supply | Qty: 12 | Fill #3

## 2022-08-10 MED FILL — AZITHROMYCIN 250 MG TABLET: ORAL | 90 days supply | Qty: 90 | Fill #1

## 2022-08-10 MED FILL — ACETAMINOPHEN 500 MG TABLET: ORAL | 23 days supply | Qty: 180 | Fill #2

## 2022-08-10 MED FILL — AZATHIOPRINE 50 MG TABLET: ORAL | 90 days supply | Qty: 90 | Fill #3

## 2022-08-10 NOTE — Unmapped (Signed)
Spencer Mallet Sr. 's valGANciclovir shipment will be delayed as a result of the medication is too soon to refill until 4/7.     I have reached out to the patient  at [phone] and communicated the delivery change. We will reschedule the medication for the delivery date that the patient agreed upon.  We have confirmed the delivery date as 4/8, via same day courier.

## 2022-08-16 ENCOUNTER — Ambulatory Visit: Admit: 2022-08-16 | Discharge: 2022-08-17 | Payer: MEDICARE

## 2022-08-16 DIAGNOSIS — Z942 Lung transplant status: Principal | ICD-10-CM

## 2022-08-16 LAB — COMPREHENSIVE METABOLIC PANEL
ALBUMIN: 3.6 g/dL (ref 3.4–5.0)
ALKALINE PHOSPHATASE: 96 U/L (ref 46–116)
ALT (SGPT): 37 U/L (ref 10–49)
ANION GAP: 5 mmol/L (ref 5–14)
AST (SGOT): 32 U/L (ref <=34)
BILIRUBIN TOTAL: 0.4 mg/dL (ref 0.3–1.2)
BLOOD UREA NITROGEN: 35 mg/dL — ABNORMAL HIGH (ref 9–23)
BUN / CREAT RATIO: 26
CALCIUM: 9.3 mg/dL (ref 8.7–10.4)
CHLORIDE: 108 mmol/L — ABNORMAL HIGH (ref 98–107)
CO2: 31.1 mmol/L — ABNORMAL HIGH (ref 20.0–31.0)
CREATININE: 1.33 mg/dL — ABNORMAL HIGH
EGFR CKD-EPI (2021) MALE: 57 mL/min/{1.73_m2} — ABNORMAL LOW (ref >=60)
GLUCOSE RANDOM: 101 mg/dL (ref 70–179)
POTASSIUM: 4.1 mmol/L (ref 3.4–4.8)
PROTEIN TOTAL: 6 g/dL (ref 5.7–8.2)
SODIUM: 144 mmol/L (ref 135–145)

## 2022-08-16 LAB — IRON & TIBC
IRON SATURATION (CALC): 18 %
IRON: 60 ug/dL — ABNORMAL LOW
TOTAL IRON BINDING CAPACITY (CALC): 333.9 ug/dL (ref 250.0–425.0)
TRANSFERRIN: 265 mg/dL

## 2022-08-16 LAB — CBC W/ AUTO DIFF
BASOPHILS ABSOLUTE COUNT: 0 10*9/L (ref 0.0–0.1)
BASOPHILS RELATIVE PERCENT: 0.4 %
EOSINOPHILS ABSOLUTE COUNT: 0 10*9/L (ref 0.0–0.5)
EOSINOPHILS RELATIVE PERCENT: 1.1 %
HEMATOCRIT: 33.6 % — ABNORMAL LOW (ref 39.0–48.0)
HEMOGLOBIN: 11.2 g/dL — ABNORMAL LOW (ref 12.9–16.5)
LYMPHOCYTES ABSOLUTE COUNT: 0.5 10*9/L — ABNORMAL LOW (ref 1.1–3.6)
LYMPHOCYTES RELATIVE PERCENT: 15.1 %
MEAN CORPUSCULAR HEMOGLOBIN CONC: 33.4 g/dL (ref 32.0–36.0)
MEAN CORPUSCULAR HEMOGLOBIN: 31.8 pg (ref 25.9–32.4)
MEAN CORPUSCULAR VOLUME: 95.2 fL (ref 77.6–95.7)
MEAN PLATELET VOLUME: 7 fL (ref 6.8–10.7)
MONOCYTES ABSOLUTE COUNT: 0.3 10*9/L (ref 0.3–0.8)
MONOCYTES RELATIVE PERCENT: 7.4 %
NEUTROPHILS ABSOLUTE COUNT: 2.7 10*9/L (ref 1.8–7.8)
NEUTROPHILS RELATIVE PERCENT: 76 %
NUCLEATED RED BLOOD CELLS: 0 /100{WBCs} (ref <=4)
PLATELET COUNT: 136 10*9/L — ABNORMAL LOW (ref 150–450)
RED BLOOD CELL COUNT: 3.53 10*12/L — ABNORMAL LOW (ref 4.26–5.60)
RED CELL DISTRIBUTION WIDTH: 14.4 % (ref 12.2–15.2)
WBC ADJUSTED: 3.6 10*9/L (ref 3.6–11.2)

## 2022-08-16 LAB — FERRITIN: FERRITIN: 15.3 ng/mL

## 2022-08-16 LAB — VITAMIN B12: VITAMIN B-12: 231 pg/mL (ref 211–911)

## 2022-08-16 LAB — MAGNESIUM: MAGNESIUM: 1.8 mg/dL (ref 1.6–2.6)

## 2022-08-16 LAB — TACROLIMUS LEVEL, TROUGH: TACROLIMUS, TROUGH: 6.9 ng/mL (ref 5.0–15.0)

## 2022-08-16 LAB — CMV DNA, QUANTITATIVE, PCR: CMV VIRAL LD: NOT DETECTED

## 2022-08-16 LAB — IGG: GAMMAGLOBULIN; IGG: 805 mg/dL (ref 650–1600)

## 2022-08-16 LAB — EBV QUANTITATIVE PCR, BLOOD: EBV VIRAL LOAD RESULT: NOT DETECTED

## 2022-08-16 LAB — PHOSPHORUS: PHOSPHORUS: 2.4 mg/dL (ref 2.4–5.1)

## 2022-08-16 MED FILL — VALGANCICLOVIR 450 MG TABLET: ORAL | 30 days supply | Qty: 30 | Fill #1

## 2022-08-16 NOTE — Unmapped (Signed)
Called Spencer Mallet Sr. to discuss lab results. Tacrolimus level was normal at 6.9 with a goal of 5-7. Medication regimen is currently 0.5 mg BID. Dosing at this time to remain the same. Repeat labs scheduled one week with clinic. Resulted labs reviewed. All questions answered. Pt verbalized understanding.

## 2022-08-23 ENCOUNTER — Institutional Professional Consult (permissible substitution): Admit: 2022-08-23 | Discharge: 2022-08-24 | Payer: MEDICARE

## 2022-08-23 NOTE — Unmapped (Signed)
Stool was collected and sent to the lab.

## 2022-08-24 DIAGNOSIS — D649 Anemia, unspecified: Principal | ICD-10-CM

## 2022-08-24 NOTE — Unmapped (Signed)
Outgoing call to [employer] Sr..  C. Diff is negative, d/w Dr. Glenard Haring and advised can take imodium for diarrhea.  Questions answered, verbalized understanding.

## 2022-08-27 ENCOUNTER — Ambulatory Visit: Admit: 2022-08-27 | Discharge: 2022-08-27 | Payer: MEDICARE

## 2022-08-27 DIAGNOSIS — Z09 Encounter for follow-up examination after completed treatment for conditions other than malignant neoplasm: Principal | ICD-10-CM

## 2022-08-27 DIAGNOSIS — Z942 Lung transplant status: Principal | ICD-10-CM

## 2022-08-27 LAB — FERRITIN: FERRITIN: 20 ng/mL

## 2022-08-27 LAB — IRON & TIBC
IRON SATURATION: 18 % — ABNORMAL LOW (ref 20–55)
IRON: 64 ug/dL — ABNORMAL LOW
TOTAL IRON BINDING CAPACITY: 351 ug/dL (ref 250–425)

## 2022-08-27 NOTE — Unmapped (Signed)
Pt ID verified with Name and Date of birth. All screening questions answered. Vaccine Information Sheet given to patient prior to injection.   Vaccine(s) administered as ordered. See Immunization history for documentation. Pt tolerated the injection(s) well with no issues noted.

## 2022-08-27 NOTE — Unmapped (Signed)
In with Dr. Glenard Haring to see Spencer Pena Sr. Doing well, PFTs stable.  Repeat dexa with next clinic visit.  Covid vaccine today.  Return to clinic in 3 months. Questions answered, verbalized understanding.

## 2022-08-27 NOTE — Unmapped (Signed)
Pulmonary Transplant Clinic    HISTORY:   HPI:  Spencer Pena is a 72 y.o.  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     --> basiliximab induction    --> CMV high risk (D+/R-)    --> EBV moderate risk (D+/R+)    --> Not Increased Risk Donor  - Colon Cancer 2007    --> s/p resection of liver mets    --> s/p resection of 6 inches of colon    --> FOLFOX Chemo  - type 2 diabetes mellitus     --> not insulin dependent  - OA    SOCIAL HISTORY:   - 20pk years, quit in 1991  - previously drank about 10 drinks per week   - Worked maintenance at [employer]  - Married and lives in [place]    FAMILY HISTORY:   - Parents lived into late 90's    MEDS: (personally reviewed in [place], pertinent meds noted below)     Current Outpatient Medications on File Prior to Visit   Medication Sig Dispense Refill    acetaminophen (TYLENOL EXTRA STRENGTH) 500 MG tablet Take 1-2 tablets (500-1,000 mg total) by mouth every six (6) hours as needed for pain. 180 tablet PRN    aspirin (ECOTRIN) 81 MG tablet Take 1 tablet (81 mg total) by mouth daily. 90 tablet 3    atorvastatin (LIPITOR) 40 MG tablet Take 1 tablet (40 mg total) by mouth daily. 90 tablet 3    azathioprine (IMURAN) 50 mg tablet Take 1 tablet (50 mg total) by mouth daily. 90 tablet 3    azithromycin (ZITHROMAX) 250 MG tablet Take 1 tablet (250 mg total) by mouth daily. 30 tablet 11    blood sugar diagnostic (ON CALL EXPRESS TEST STRIP) Strp Test daily before all meals/snacks and once before bedtime. 100 each 11    calcium carbonate (CALCIUM 600) 1,500 mg (600 mg elem calcium) tablet Take 1 tablet (600 mg of elem calcium total) by mouth Two (2) times a day. 60 tablet 11    [EXPIRED] cholecalciferol, vitamin D3 25 mcg, 1,000 units,, 1,000 unit (25 mcg) tablet Take 2 tablets (50 mcg total) by mouth daily. 100 tablet 11    fluticasone propion-salmeterol (ADVAIR HFA) 115-21 mcg/actuation inhaler Inhale 2 puffs Two (2) times a day. 12 g 11    fluticasone propionate (FLONASE) 50 mcg/actuation nasal spray Use 2 sprays into each nostril daily. 16 g 11    magnesium oxide (MAG-OX) 400 mg (241.3 mg elemental magnesium) tablet Take 3 tablets (1,200 mg total) by mouth Two (2) times a day. 180 tablet 11    metFORMIN (GLUCOPHAGE) 1000 MG tablet Take 1 tablet (1,000 mg total) by mouth Two (2) times a day. 180 tablet 3    montelukast (SINGULAIR) 10 mg tablet Take 1 tablet (10 mg total) by mouth daily. 90 tablet 3    pantoprazole (PROTONIX) 40 MG tablet  Take 1 tablet (40 mg total) by mouth daily. 90 tablet 3    predniSONE (DELTASONE) 5 MG tablet Take 1.5 tablets (7.5 mg total) by mouth daily. 135 tablet 3    SITagliptin phosphate (JANUVIA) 100 MG tablet Take 1 tablet (100 mg total) by mouth daily. 90 tablet 3    sulfamethoxazole-trimethoprim (BACTRIM) 400-80 mg per tablet Take 1 tablet (80 mg of trimethoprim total) by mouth Every Monday, Wednesday, and Friday. 36 tablet 3    tacrolimus (PROGRAF) 0.5 MG capsule Take 1 capsule (0.5 mg total) by mouth two (2) times a day. 180 capsule 3    terazosin (HYTRIN) 1 MG capsule Take 3 capsules (3 mg total) by mouth nightly. 90 capsule 11    valGANciclovir (VALCYTE) 450 mg tablet Take 1 tablet (450 mg total) by mouth daily. 30 tablet 11     No current facility-administered medications on file prior to visit.       OBJECTIVE DATA:   PHYSICAL EXAM:  BP 133/68 (BP Site: L Arm, BP Position: Sitting, BP Cuff Size: Medium)  - Pulse 76  - Temp 36.6 ??C (97.9 ??F) (Tympanic)  - Ht 170.2 cm (5' 7)  - Wt 73.5 kg (162 lb)  - SpO2 98%  - BMI 25.37 kg/m??      Gen: - awake, alert, in NAD  Derm: - Small approx 3cm x1cm abrasions with surrounding bruising/erythema noted to bilateral shins.  Healthy granulation tissue noted.  No purulent drainage observed.  Vascular: - good pulses throughout  CV: - RRR, no m/r/g  Pulm: - CTAB, good air movement bilaterally   Abd: - soft, NT, ND, no hepatosplenomegaly  Ext: - no edema of LE  Neuro: - Alert, oriented, follows commands. Moving all extremities spontaneously    LABS: (reviewed in Epic, pertinent values noted below)   PFTs: (personally reviewed and interpereted):    Date: FVC (% Pred) FEV1 (% Pred) FEF25-75(% Pred) DLCO TBBx/Results   08/27/19 4.20 (123%) 3.67 (141%) 5.59 (276.9%)     05/28/22 4.16 (121.5%) 3.69 (141.4%) 3.23 (307.1%)     02/19/2022 4.03 (110.4) 3.52 (126.7) 5.19 (241.3)     11/17/21 4.13 (113%) 3.63 (130%)      08/18/21 4.01 (109%) 3.55 (127%)      05/15/2021 4.21(114%) 3.65 (130.3%) 5.54 (254%)     04/06/2021 4.08 (111%) 3.64 (129.6%) 6.01 (274.3%)     12/31/20 3.95 (107.1%) 3.46 (122.6%) 5.20 (236.4%)     12/03/2020 3.87 (104.9%) 3.47 (122.9%) 5.69 (258.3%)     10/03/2020 3.68 (99.4) 3.13 (110.6)  4.22 (190.6)      09/05/20  3.70  3.14      08/20/20 3.65 (98%) 3.15 (111%) 4.69 (211%)      08/01/20 3.64 (98.2%) 3.10 (109.3%) 4.41 (198.8%)     07/18/20 3.49 (94%) 2.98 (105%) 4.20 (189%)     07/04/20 3.43 (92.5%) 2.97 (104.7%) 4.26 (191.7%)     06/27/2020 3.13 84.5 2.70 95.4 3.51 157.8     06/20/20 3.39 (91.4%) 2.82 (99.2%) 3.34 (150.35)     06/13/20 3.10 (72.8%) 2.66 (82.5%) 3.34 (136.2%)      05/15/20      BOLT    03/11/20  2.75 (72%)  2.36 (81%)       02/12/20  2.85 (75%)  2.44 (86%)                IMAGING: Reviewed in EMR, CXR with broken sternal wires, otherwise stable    ASSESSMENT and PLAN     Mr.  Deepak Pena is a 72 y.o. man with IPF and emphysema s/p BOLT on 05/15/2020, also with type 2 diabetes mellitus, hypertension, colon cancer in 2007 s/p partial colon and hepatic wedge resection and FOLFOX here for follow up      Graft Function and Immunosuppression:  - PFTs and CXR deferred today but patient has been feeling well and testing has been stable.    - HLA status: No DSAs, checked monthly, last 06/2022  - Total IgG <700 but insurance will not approve IVIg  - Immunosuppression:   - Continue tac goal 5-7. Currently taking 0.5mg  BID  - AZA 50 mg daily; dropped from 100 mg 08/01/20  -Stopped MMF 06/13/20 (GI upset)  - Prednisone 7.5mg  daily  - BOS PPX  - Azithromycin 250mg  daily  - ASA 81mg  daily  - Continue atorvastatin 02/2022  - Continue Advair BID  - Montelukast 10mg  daily    Antimicrobial Prophylaxis:  - CMV, high risk (D+/R-): valganciclovir 450mg  daily indefinitely for Estimated Creatinine Clearance: 46.9 mL/min (A) (based on SCr of 1.33 mg/dL (H)). Negative VL 12/29/2021. Plan for repeat T cell assay at 2 years.  Should get with next labs, order entered.  - EBV (D+/R+): negative 12/29/21  - Fungal: Posaconazole stopped at 48mo  - PCP: Bactrim SS MWF    Pancytopenia: Mild and stable over one year. Monitoring on current immunosuppressive regimen for now     Broken sternal wires  - noted on CXR in clinic 02/2022  - asymptomatic    Skin Bruising - start biotin and CoQ10. Likely related to prednisone and aspirin.    Pain control - previously required gabapentin, now only on PRN acetaminophen.    OSA   - STOP-BANG 4 points (male, age, observed apnea, snoring).  - PSG 12/29/2020   CPAP at level of 10 cm H2O with expiratory pressure relief (EPR) = 3 and with heated humidifier for nasal dryness, mask: AirFit N20 size medium or mask of patient???s preference.  The patient needs to follow up for CPAP compliance 31 to 90 days after initiation of CPAP therapy to ensure compliance of at least 4 hours per night for more than 70% of nights.  - Not using CPAP d/t discomfort. Talking to home health company.     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  - pH probe with DeMeester 16.7%, slightly in abnormal range but will hold off on surgical intervention at this time in the setting of normal graft function.  - Daily PPI     BPH - terazosin 3mg  at bedtime, continues with nocturia  - UA completed at prior appt, benign.  - Normal CEA and PSA  - Referred to urology and has upcoming appt    CKD IIIa - monitoring    Iron Deficiency Anemia - received IV iron 05/22/20, ferritin 12.5 08/2021, continues to be low    Hypomagnesemia - magnesium oxide 1200 mg BID     Type 2 diabetes mellitus   - Lantus will drop to 12 units in the nightly with pred adjustment  - aspart 7/7/3 with pred adjustment  - off insulin now  - metformin 1000mg  BID  - Januvia  - sugars well controlled  - PVL ABI and  carotid duplex normal 11/2021    Bone Health: Last Dexa: osteopenia on DEXA 11/17/2021  - on Ca and Vit D supplementation currently    Health Maintenance:  - Last derm visit:  12/2021  - Last Colonsocopy (if >40): 12/2021  - Last flu shot:   02/19/2022; needs repeat 05/28/22  - RSV vaccine:   Completed locally 06/2022  - Last Pneumovax:  06/10/2004, 12/30/2017  - Last Prevnar:  03/25/2016  - TdaP:   02/11/2018  - Shingrix:    03/10/20, 08/20/20  - COVID-19 (Moderna) 06/11/19, 07/09/19, 03/10/20, 07/18/2020, 12/03/20 received 1/2 dose Evushield,03/2021 (Bivalent); SpikeVax 02/19/2022 and 08/27/22  - Last Dexa:   02/2020, 11/17/21 -> T-score -2.2 FRAX score 8% will repeat in July 2024  - Last vitamin D:  66.9 05/2021  - Last HbA1c:   6.2 (02/2022)    Immunization History   Administered Date(s) Administered    COVID-19 VAC,BIVALENT(52YR UP),PFIZER 08/18/2021    COVID-19 VACCINE,MRNA(MODERNA)(PF) 06/11/2019, 07/09/2019, 03/10/2020, 07/18/2020, 12/03/2020    Covid-19 Vac, (6yr+) (Spikevax) Monovalent Xbb.1.5 Moder  02/19/2022    HEPATITIS B VACCINE ADULT, ADJUVANTED, IM(HEPLISAV B) 03/12/2020    INFLUENZA INJ MDCK PF, QUAD,(FLUCELVAX)(19MO AND UP EGG FREE) 02/08/2020    INFLUENZA QUAD ADJUVANTED 52YR UP(FLUAD) 05/28/2022    INFLUENZA TIV (TRI) PF (IM) 02/27/2010    Influenza Vaccine Quad(IM)6 MO-Adult(PF) 01/19/2019, 04/06/2021, 02/19/2022    Influenza Virus Vaccine, unspecified formulation 02/07/2013, 02/28/2015, 02/05/2016, 02/02/2017, 01/27/2018    PNEUMOCOCCAL POLYSACCHARIDE 23-VALENT 06/10/2004, 12/30/2017    Pneumococcal Conjugate 13-Valent 03/25/2016    RSV VACCINE,ADJUVANTED(PF)(AREXVY) 06/05/2022    SHINGRIX-ZOSTER VACCINE (HZV),RECOMBINANT,ADJUVANTED(IM) 03/10/2020, 08/20/2020    TdaP 02/15/2012, 02/11/2018     >44min was spent with the patient face to face and >5min was spent reviewing chart/imaging.     Complex medical decision making was done as we adjust medications based on blood work/drug levels and multiple complaints and problems were addressed    The patient was assessed and discussed with Dr. Glenard Haring who is in agreement with plan.    Edmon Crape MD  Pulmonology and Critical Care Fellow

## 2022-08-27 NOTE — Unmapped (Signed)
[facility] CLINIC PHARMACY NOTE  08/27/2022   Spencer FRANCKE Sr.  [id number]    Medication changes today:   1. No changes in clinic today    Education/Adherence tools provided today:  1. provided updated medication list    Follow up items:  1. Repeat DEXA at next clinic visit  2. PCV20     Next visit with pharmacy in PRN  ____________________________________________________________________    Spencer Mallet Sr. is a 72 y.o. 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     Other PMH significant for hypertension, T2DM, colon cancer s/p hemicolectomy and R hepatectomy for metastasis + chemotherapy (2007), BPH    Seen by pharmacy today for: medication management and  pill box assessment and adherence education.     CC:  Patient  has no complaints today; reports recent diarrhea has since resolved with use of imodium and his fatigue is improving       There were no vitals filed for this visit.    Allergies   Allergen Reactions    Cetuximab Anaphylaxis     Chest pain/rapid heart rate/ BP dropped    Nsaids (Non-Steroidal Anti-Inflammatory Drug)      Interaction with transplant medications    Venom-Honey Bee Anaphylaxis    Enalapril      Angioedema      Pollen Extracts Other (See Comments)     Nasal stuffiness, coughing, sneezing     Medications reviewed in EPIC medication station and updated today by the clinical pharmacist practitioner. Medication list includes revisions made during today???s encounter.    Outpatient Encounter Medications as of 08/27/2022   Medication Sig Dispense Refill    acetaminophen (TYLENOL EXTRA STRENGTH) 500 MG tablet Take 1-2 tablets (500-1,000 mg total) by mouth every six (6) hours as needed for pain. 180 tablet PRN    aspirin (ECOTRIN) 81 MG tablet Take 1 tablet (81 mg total) by mouth daily. 90 tablet 3    atorvastatin (LIPITOR) 40 MG tablet Take 1 tablet (40 mg total) by mouth daily. 90 tablet 3    azathioprine (IMURAN) 50 mg tablet Take 1 tablet (50 mg total) by mouth daily. 90 tablet 3    azithromycin (ZITHROMAX) 250 MG tablet Take 1 tablet (250 mg total) by mouth daily. 30 tablet 11    blood sugar diagnostic (ON CALL EXPRESS TEST STRIP) Strp Test daily before all meals/snacks and once before bedtime. 100 each 11    calcium carbonate (CALCIUM 600) 1,500 mg (600 mg elem calcium) tablet Take 1 tablet (600 mg of elem calcium total) by mouth Two (2) times a day. 60 tablet 11    [EXPIRED] cholecalciferol, vitamin D3 25 mcg, 1,000 units,, 1,000 unit (25 mcg) tablet Take 2 tablets (50 mcg total) by mouth daily. 100 tablet 11    fluticasone propion-salmeterol (ADVAIR HFA) 115-21 mcg/actuation inhaler Inhale 2 puffs Two (2) times a day. 12 g 11    fluticasone propionate (FLONASE) 50 mcg/actuation nasal spray Use 2 sprays into each nostril daily. 16 g 11    magnesium oxide (MAG-OX) 400 mg (241.3 mg elemental magnesium) tablet Take 3 tablets (1,200 mg total) by mouth Two (2) times a day. 180 tablet 11    metFORMIN (GLUCOPHAGE) 1000 MG tablet Take 1 tablet (1,000 mg total) by mouth Two (2) times a day. 180 tablet 3    montelukast (SINGULAIR) 10 mg tablet Take 1 tablet (10 mg total) by mouth daily. 90 tablet 3  pantoprazole (PROTONIX) 40 MG tablet Take 1 tablet (40 mg total) by mouth daily. 90 tablet 3    predniSONE (DELTASONE) 5 MG tablet Take 1.5 tablets (7.5 mg total) by mouth daily. 135 tablet 3    SITagliptin phosphate (JANUVIA) 100 MG tablet Take 1 tablet (100 mg total) by mouth daily. 90 tablet 3    sulfamethoxazole-trimethoprim (BACTRIM) 400-80 mg per tablet Take 1 tablet (80 mg of trimethoprim total) by mouth Every Monday, Wednesday, and Friday. 36 tablet 3    tacrolimus (PROGRAF) 0.5 MG capsule Take 1 capsule (0.5 mg total) by mouth two (2) times a day. 180 capsule 3    terazosin (HYTRIN) 1 MG capsule Take 3 capsules (3 mg total) by mouth nightly. 90 capsule 11    valGANciclovir (VALCYTE) 450 mg tablet Take 1 tablet (450 mg total) by mouth daily. 30 tablet 11     No facility-administered encounter medications on file as of 08/27/2022.       Immunosuppression:  Induction agent: basiliximab    Current immunosuppression:  tacrolimus 0.5 mg BID  Prograf goal:  5-7  (lower goal given CKD, no signs of rejection, tolerating imuran)  Azathioprine 50 mg daily    changed from MMF 2/2 diarrhea; dose decreased from 100mg  to 75mg  on 08/01/20 given down-trending ANC and IgG   dose decrease to 50mg  on 10/03/20   prednisone 7.5 mg daily    Patient is tolerating immunosuppression well     IMMUNOSUPPRESSION DRUG LEVELS:  Lab Results   Component Value Date    Tacrolimus, Trough 6.9 08/16/2022    Tacrolimus, Trough 7.2 07/13/2022    Tacrolimus, Trough 7.6 06/14/2022     Tacrolimus level is accurate 12 hour trough.    Graft function assessment via PFTs: stable  DSA:  NTD  Biopsies to date: none  WBC/ANC:  wnl      Plan: Will maintain current immunosuppression    OI prophylaxis:   CMV Status: D+/ R-, high risk. CMV prophylaxis with valganciclovir 450 mg daily x 12 months per protocol. Completed T-cell immunity panel with no interferon gamma production for CD4 cells.   Estimated Creatinine Clearance: 46.9 mL/min (A) (based on SCr of 1.33 mg/dL (H)).  PCP: Prophylaxis with bactrim SS 1 tab MWF indefinitely.  Fungal: Completed posaconazole 300 mg daily for prophylaxis x 3 months per protocol - COMPLETE (08/20/20)    Patient is tolerating infectious prophylaxis well.    Plan:  Repeat CMV T-cell immunity panel, continue valcyte ppx given high risk status.    Infectious History:  Patient does not have a history of pathogenic infectious complications following lung transplant. 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 Previous antiinfective history use includes meropenem x7 days for broad spectrum gram negative and aspiration PNA coverage post-op per ICID.     Plan: Continue to monitor.    BOS/CLAD prophylaxis:  Current meds include: azithromycin 250 mg daily, motelukast 10 mg, and pantoprazole 40 mg daily, advair inhaler  Esophageal motility study on 07/23/20 showed abnormal distal esophagus exposure during recumbent posture and overall borderline normal. 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 Patient denies any reflux symptoms with every day dosing.     Plan: Continue    CV Prophylaxis:   Aspirin: asa 81 mg daily  The 10-year ASCVD risk score (Arnett DK, et al., 2019) is: 33.1%  Statin: atorvastatin; 40 mg daily - pt tolerated switch from pravastatin well     Plan: Continue  BP/Edema: Goal <130/80.   Encounter vitals reported above.  Home BP ranges: 130s/70s mmHg  Weights: stable  Current meds include: terazosin 3mg  at bedtime for BPH    Plan: BP and BPH within goal. Continue.    Anemia:  H/H:   Lab Results   Component Value Date    HGB 11.2 (L) 08/16/2022     Lab Results   Component Value Date    HCT 33.6 (L) 08/16/2022     Iron panel:  Lab Results   Component Value Date    IRON 64 (L) 08/27/2022    TIBC 351 08/27/2022    FERRITIN 20.0 08/27/2022     Lab Results   Component Value Date    Iron Saturation (%) 18 (L) 08/27/2022     Prior anemia therapy: IV Ferrlecit (1/10-1/13)    Plan: Iron panel today did not meet IV iron criteria, will continue to monitor      DM:   Lab Results   Component Value Date    A1C 6.2 (H) 02/19/2022   Goal A1c < 7%.  Current meds include:  Metformin 1000 mg PO BID  Januvia 100 mg PO daily    Home BS log: Fast[place] within goal consistently, patient remains controlled on oral therapy.  Hypoglycemia: no  Plan:  continue current reigmen    Electrolytes: wnl  Current meds include: MgOx 1200 mg BID    Plan: continue current therapy.     Bone health:  Patient currently on long-term steroid therapy.  Vitamin D Level:  66.9 (1/23) . Goal > 30.   Last DEXA results (11/17/21): osteopenia  Current meds include: vitamin D3 2000 units daily, calcium carbonate 600 mg BID    Plan: Continue current therapy. Continue to monitor.    Women's/Men's Health:  SAFWAN SIROKY Sr. is a 72 y.o. male. Patient reports  history of BPH . Reports that BPH symptoms are controlled.  Current meds include: terazosin 3 mg PO nightly (increased 09/05/20 given increased BPH symptoms)     Plan: Continue current dose and BP monitoring.     Adherence:   Patient has good understanding of medications.  Patient does fill their own pill box on a regular basis at home. Wife had previously been filing for him, but he now handles it on his own for the most part.   Patient brought medication card: yes  Pill box: did not bring  Patient requested refills for the following meds: None  Corrections needed in Epic medication list: none    Plan: Provided basic adherence counseling/intervention    I spent a total of 20 minutes face to face with the patient delivering clinical care and providing education/counseling.    Patient was reviewed with Dr. Roselee Culver, Dr. Natale Milch, and [facility] who were in agreement with the stated plan.    During this visit, the following was completed:   BG log data assessment  BP log data assessment  Labs ordered and evaluated  complex treatment plan >1 DS   Patient education was completed for 11-24 minutes     All questions/concerns were addressed to the patient's satisfaction.  __________________________________________  PATIENT SEEN AND EVALUATED BY:  Kennyth Arnold, PharmD  PGY1 Acute Care Pharmacy Resident    Letha Cape, CPP, PharmD, BCPS, CPP

## 2022-08-27 NOTE — Unmapped (Signed)
Please see pharmacy encounter.

## 2022-09-01 MED ORDER — TERAZOSIN 1 MG CAPSULE
ORAL_CAPSULE | Freq: Every evening | ORAL | 11 refills | 30 days | Status: CP
Start: 2022-09-01 — End: ?
  Filled 2022-09-06: qty 90, 30d supply, fill #0

## 2022-09-01 NOTE — Unmapped (Signed)
The patient is requesting a medication refill

## 2022-09-06 MED FILL — ATORVASTATIN 40 MG TABLET: ORAL | 90 days supply | Qty: 90 | Fill #2

## 2022-09-06 MED FILL — CALCIUM 600 MG (AS CALCIUM CARBONATE 1,500 MG) TABLET: ORAL | 90 days supply | Qty: 180 | Fill #2

## 2022-09-06 MED FILL — PANTOPRAZOLE 40 MG TABLET,DELAYED RELEASE: ORAL | 90 days supply | Qty: 90 | Fill #2

## 2022-09-07 MED ORDER — PREDNISONE 5 MG TABLET
ORAL_TABLET | Freq: Every day | ORAL | 3 refills | 90 days | Status: CP
Start: 2022-09-07 — End: ?
  Filled 2022-09-10: qty 135, 90d supply, fill #0

## 2022-09-07 MED ORDER — CHOLECALCIFEROL (VITAMIN D3) 25 MCG (1,000 UNIT) TABLET
ORAL_TABLET | Freq: Every day | ORAL | 11 refills | 50 days | Status: CP
Start: 2022-09-07 — End: 2023-09-07
  Filled 2022-09-10: qty 100, 50d supply, fill #0

## 2022-09-07 NOTE — Unmapped (Signed)
[name] Refill Coordination Note    Specialty Medication(s) to be Shipped:   Transplant: valgancyclovir 450mg  and Prednisone 5mg     Other medication(s) to be shipped:  advair , aspirin , vitamin d3 and flonase     Spencer Pena Sr., DOB: 07/31/1950  Phone: [phone] (home) [phone] (work)      All above HIPAA information was verified with patient.     Was a Nurse, learning disability used for this call? No    Completed refill call assessment today to schedule patient's medication shipment from the [facility] [phone]).  All relevant notes have been reviewed.     Specialty medication(s) and dose(s) confirmed: Regimen is correct and unchanged.   Changes to medications: Vikram reports no changes at this time.  Changes to insurance: No  New side effects reported not previously addressed with a pharmacist or physician: None reported  Questions for the pharmacist: No    Confirmed patient received a Conservation officer, historic buildings and a Surveyor, mining with first shipment. The patient will receive a drug information handout for each medication shipped and additional FDA Medication Guides as required.       DISEASE/MEDICATION-SPECIFIC INFORMATION        N/A    SPECIALTY MEDICATION ADHERENCE     Medication Adherence    Patient reported X missed doses in the last month: 0  Specialty Medication: predniSONE 5 MG tablet (DELTASONE)  Patient is on additional specialty medications: Yes  Additional Specialty Medications: valGANciclovir 450 mg tablet (VALCYTE)  Patient Reported Additional Medication X Missed Doses in the Last Month: 0  Patient is on more than two specialty medications: No              Were doses missed due to medication being on hold? No    prednisone 5 mg: 7 days of medicine on hand   valganciclovir 450 mg: 7 days of medicine on hand       REFERRAL TO PHARMACIST     Referral to the pharmacist: Not needed      [facility]     Shipping address confirmed in Epic.       Delivery Scheduled: Yes, Expected medication delivery date: 09/10/22.     Medication will be delivered via UPS to the prescription address in Epic WAM.    Spencer Pena   [facility] Specialty Technician

## 2022-09-10 MED FILL — ASPIRIN 81 MG TABLET,DELAYED RELEASE: ORAL | 90 days supply | Qty: 90 | Fill #2

## 2022-09-10 MED FILL — VALGANCICLOVIR 450 MG TABLET: ORAL | 30 days supply | Qty: 30 | Fill #2

## 2022-09-10 MED FILL — FLUTICASONE PROPIONATE 50 MCG/ACTUATION NASAL SPRAY,SUSPENSION: NASAL | 90 days supply | Qty: 48 | Fill #3

## 2022-09-10 MED FILL — ADVAIR HFA 115 MCG-21 MCG/ACTUATION AEROSOL INHALER: RESPIRATORY_TRACT | 30 days supply | Qty: 12 | Fill #4

## 2022-09-24 ENCOUNTER — Ambulatory Visit: Admit: 2022-09-24 | Discharge: 2022-09-25 | Payer: MEDICARE

## 2022-09-24 LAB — COMPREHENSIVE METABOLIC PANEL
ALBUMIN: 3.6 g/dL (ref 3.4–5.0)
ALKALINE PHOSPHATASE: 96 U/L (ref 46–116)
ALT (SGPT): 24 U/L (ref 10–49)
ANION GAP: 5 mmol/L (ref 5–14)
AST (SGOT): 27 U/L (ref <=34)
BILIRUBIN TOTAL: 0.4 mg/dL (ref 0.3–1.2)
BLOOD UREA NITROGEN: 28 mg/dL — ABNORMAL HIGH (ref 9–23)
BUN / CREAT RATIO: 20
CALCIUM: 9.4 mg/dL (ref 8.7–10.4)
CHLORIDE: 108 mmol/L — ABNORMAL HIGH (ref 98–107)
CO2: 30.7 mmol/L (ref 20.0–31.0)
CREATININE: 1.42 mg/dL — ABNORMAL HIGH
EGFR CKD-EPI (2021) MALE: 53 mL/min/{1.73_m2} — ABNORMAL LOW (ref >=60)
GLUCOSE RANDOM: 94 mg/dL (ref 70–179)
POTASSIUM: 4.1 mmol/L (ref 3.4–4.8)
PROTEIN TOTAL: 6.1 g/dL (ref 5.7–8.2)
SODIUM: 144 mmol/L (ref 135–145)

## 2022-09-24 LAB — CBC W/ AUTO DIFF
BASOPHILS ABSOLUTE COUNT: 0 10*9/L (ref 0.0–0.1)
BASOPHILS RELATIVE PERCENT: 0.8 %
EOSINOPHILS ABSOLUTE COUNT: 0 10*9/L (ref 0.0–0.5)
EOSINOPHILS RELATIVE PERCENT: 1.1 %
HEMATOCRIT: 33.3 % — ABNORMAL LOW (ref 39.0–48.0)
HEMOGLOBIN: 11.1 g/dL — ABNORMAL LOW (ref 12.9–16.5)
LYMPHOCYTES ABSOLUTE COUNT: 0.6 10*9/L — ABNORMAL LOW (ref 1.1–3.6)
LYMPHOCYTES RELATIVE PERCENT: 14.8 %
MEAN CORPUSCULAR HEMOGLOBIN CONC: 33.3 g/dL (ref 32.0–36.0)
MEAN CORPUSCULAR HEMOGLOBIN: 30.9 pg (ref 25.9–32.4)
MEAN CORPUSCULAR VOLUME: 92.7 fL (ref 77.6–95.7)
MEAN PLATELET VOLUME: 7.4 fL (ref 6.8–10.7)
MONOCYTES ABSOLUTE COUNT: 0.3 10*9/L (ref 0.3–0.8)
MONOCYTES RELATIVE PERCENT: 6.8 %
NEUTROPHILS ABSOLUTE COUNT: 3.3 10*9/L (ref 1.8–7.8)
NEUTROPHILS RELATIVE PERCENT: 76.5 %
NUCLEATED RED BLOOD CELLS: 0 /100{WBCs} (ref <=4)
PLATELET COUNT: 153 10*9/L (ref 150–450)
RED BLOOD CELL COUNT: 3.6 10*12/L — ABNORMAL LOW (ref 4.26–5.60)
RED CELL DISTRIBUTION WIDTH: 14.1 % (ref 12.2–15.2)
WBC ADJUSTED: 4.4 10*9/L (ref 3.6–11.2)

## 2022-09-24 LAB — IGG: GAMMAGLOBULIN; IGG: 793 mg/dL (ref 650–1600)

## 2022-09-24 LAB — TACROLIMUS LEVEL, TROUGH: TACROLIMUS, TROUGH: 5.7 ng/mL (ref 5.0–15.0)

## 2022-09-24 LAB — MAGNESIUM: MAGNESIUM: 1.9 mg/dL (ref 1.6–2.6)

## 2022-09-24 LAB — PHOSPHORUS: PHOSPHORUS: 3.5 mg/dL (ref 2.4–5.1)

## 2022-09-24 NOTE — Unmapped (Signed)
Called Spencer Mallet Sr. to discuss lab results. Tacrolimus level was normal at 5.7 with a goal of 5-7. Medication regimen is currently 0.5 mg BID. Dosing at this time to remain the same. Repeat labs scheduled monthly. Resulted labs reviewed. All questions answered. Pt verbalized understanding.

## 2022-09-25 LAB — EBV QUANTITATIVE PCR, BLOOD: EBV VIRAL LOAD RESULT: NOT DETECTED

## 2022-09-25 LAB — CMV DNA, QUANTITATIVE, PCR: CMV VIRAL LD: NOT DETECTED

## 2022-09-28 LAB — CMV T-CELL IMMUNITY PANEL

## 2022-09-29 MED ORDER — MAGNESIUM OXIDE 400 MG (241.3 MG MAGNESIUM) TABLET
ORAL_TABLET | Freq: Two times a day (BID) | ORAL | 11 refills | 30.00000 days | Status: CN
Start: 2022-09-29 — End: 2023-09-29
  Filled 2022-10-05: qty 180, 30d supply, fill #0

## 2022-09-29 NOTE — Unmapped (Signed)
The patient is requesting a medication refill

## 2022-09-30 MED FILL — SULFAMETHOXAZOLE 400 MG-TRIMETHOPRIM 80 MG TABLET: ORAL | 84 days supply | Qty: 36 | Fill #3

## 2022-09-30 MED FILL — TERAZOSIN 1 MG CAPSULE: ORAL | 30 days supply | Qty: 90 | Fill #1

## 2022-10-15 NOTE — Unmapped (Signed)
Patient declined refills today on valganciclovir.  Would like a call back later next week

## 2022-10-20 MED ORDER — SITAGLIPTIN PHOSPHATE 100 MG TABLET
ORAL_TABLET | Freq: Every day | ORAL | 3 refills | 90 days | Status: CP
Start: 2022-10-20 — End: 2023-10-20
  Filled 2022-10-21: qty 90, 90d supply, fill #0

## 2022-10-20 NOTE — Unmapped (Signed)
[facility] Refill Coordination Note    Specialty Medication(s) to be Shipped:   Transplant: valgancyclovir 450mg     Other medication(s) to be shipped:  Vitamin D3 & Januvia     Spencer GAYLER Sr., DOB: Jul 24, 1950  Phone: [phone] (home) [phone] (work)      All above HIPAA information was verified with patient.     Was a Nurse, learning disability used for this call? No    Completed refill call assessment today to schedule patient's medication shipment from the [facility] [phone]).  All relevant notes have been reviewed.     Specialty medication(s) and dose(s) confirmed: Regimen is correct and unchanged.   Changes to medications: Secundino reports no changes at this time.  Changes to insurance: No  New side effects reported not previously addressed with a pharmacist or physician: None reported  Questions for the pharmacist: No    Confirmed patient received a Conservation officer, historic buildings and a Surveyor, mining with first shipment. The patient will receive a drug information handout for each medication shipped and additional FDA Medication Guides as required.       DISEASE/MEDICATION-SPECIFIC INFORMATION        N/A    SPECIALTY MEDICATION ADHERENCE     Medication Adherence    Patient reported X missed doses in the last month: 0  Specialty Medication: valGANciclovir 450 mg tablet  Patient is on additional specialty medications: Yes  Additional Specialty Medications: predniSONE 5 MG tablet   Patient Reported Additional Medication X Missed Doses in the Last Month: 0  Patient is on more than two specialty medications: No              Were doses missed due to medication being on hold? No    Valganciclovir 450 mg: 4 days of medicine on hand        REFERRAL TO PHARMACIST     Referral to the pharmacist: Not needed      [facility]     Shipping address confirmed in Epic.       Delivery Scheduled: Yes, Expected medication delivery date: 10/22/22.     Medication will be delivered via Next Day Courier to the prescription address in Epic WAM.    [name]   [facility] Specialty Technician

## 2022-10-21 MED FILL — VALGANCICLOVIR 450 MG TABLET: ORAL | 30 days supply | Qty: 30 | Fill #3

## 2022-10-21 MED FILL — CHOLECALCIFEROL (VITAMIN D3) 25 MCG (1,000 UNIT) TABLET: ORAL | 50 days supply | Qty: 100 | Fill #1

## 2022-10-26 NOTE — Unmapped (Signed)
Call placed to request repeat labs, he plans to go 6/28.

## 2022-11-03 MED ORDER — METFORMIN 1,000 MG TABLET
ORAL_TABLET | Freq: Two times a day (BID) | ORAL | 3 refills | 90 days | Status: CP
Start: 2022-11-03 — End: 2023-11-03
  Filled 2022-11-08: qty 180, 90d supply, fill #0

## 2022-11-03 MED ORDER — AZATHIOPRINE 50 MG TABLET
ORAL_TABLET | Freq: Every day | ORAL | 3 refills | 90 days | Status: CP
Start: 2022-11-03 — End: ?
  Filled 2022-11-08: qty 90, 90d supply, fill #0

## 2022-11-03 NOTE — Unmapped (Signed)
Pt request for RX Refill

## 2022-11-04 ENCOUNTER — Ambulatory Visit: Admit: 2022-11-04 | Discharge: 2022-11-05 | Payer: MEDICARE

## 2022-11-04 LAB — CBC W/ AUTO DIFF
BASOPHILS ABSOLUTE COUNT: 0 10*9/L (ref 0.0–0.1)
BASOPHILS RELATIVE PERCENT: 0.6 %
EOSINOPHILS ABSOLUTE COUNT: 0 10*9/L (ref 0.0–0.5)
EOSINOPHILS RELATIVE PERCENT: 1.2 %
HEMATOCRIT: 32.2 % — ABNORMAL LOW (ref 39.0–48.0)
HEMOGLOBIN: 10.7 g/dL — ABNORMAL LOW (ref 12.9–16.5)
LYMPHOCYTES ABSOLUTE COUNT: 0.6 10*9/L — ABNORMAL LOW (ref 1.1–3.6)
LYMPHOCYTES RELATIVE PERCENT: 14.1 %
MEAN CORPUSCULAR HEMOGLOBIN CONC: 33.2 g/dL (ref 32.0–36.0)
MEAN CORPUSCULAR HEMOGLOBIN: 30.1 pg (ref 25.9–32.4)
MEAN CORPUSCULAR VOLUME: 90.8 fL (ref 77.6–95.7)
MEAN PLATELET VOLUME: 7.4 fL (ref 6.8–10.7)
MONOCYTES ABSOLUTE COUNT: 0.3 10*9/L (ref 0.3–0.8)
MONOCYTES RELATIVE PERCENT: 6.8 %
NEUTROPHILS ABSOLUTE COUNT: 3.3 10*9/L (ref 1.8–7.8)
NEUTROPHILS RELATIVE PERCENT: 77.3 %
NUCLEATED RED BLOOD CELLS: 0 /100{WBCs} (ref <=4)
PLATELET COUNT: 158 10*9/L (ref 150–450)
RED BLOOD CELL COUNT: 3.55 10*12/L — ABNORMAL LOW (ref 4.26–5.60)
RED CELL DISTRIBUTION WIDTH: 14.4 % (ref 12.2–15.2)
WBC ADJUSTED: 4.2 10*9/L (ref 3.6–11.2)

## 2022-11-04 LAB — COMPREHENSIVE METABOLIC PANEL
ALBUMIN: 3.6 g/dL (ref 3.4–5.0)
ALKALINE PHOSPHATASE: 89 U/L (ref 46–116)
ALT (SGPT): 27 U/L (ref 10–49)
ANION GAP: 3 mmol/L — ABNORMAL LOW (ref 5–14)
AST (SGOT): 33 U/L (ref <=34)
BILIRUBIN TOTAL: 0.4 mg/dL (ref 0.3–1.2)
BLOOD UREA NITROGEN: 27 mg/dL — ABNORMAL HIGH (ref 9–23)
BUN / CREAT RATIO: 21
CALCIUM: 9.2 mg/dL (ref 8.7–10.4)
CHLORIDE: 108 mmol/L — ABNORMAL HIGH (ref 98–107)
CO2: 32.4 mmol/L — ABNORMAL HIGH (ref 20.0–31.0)
CREATININE: 1.28 mg/dL — ABNORMAL HIGH
EGFR CKD-EPI (2021) MALE: 59 mL/min/{1.73_m2} — ABNORMAL LOW (ref >=60)
GLUCOSE RANDOM: 97 mg/dL (ref 70–179)
POTASSIUM: 4.1 mmol/L (ref 3.4–4.8)
PROTEIN TOTAL: 6.1 g/dL (ref 5.7–8.2)
SODIUM: 143 mmol/L (ref 135–145)

## 2022-11-04 LAB — MAGNESIUM: MAGNESIUM: 1.9 mg/dL (ref 1.6–2.6)

## 2022-11-04 LAB — IGG: GAMMAGLOBULIN; IGG: 776 mg/dL (ref 650–1600)

## 2022-11-04 LAB — TACROLIMUS LEVEL, TROUGH: TACROLIMUS, TROUGH: 4.7 ng/mL — ABNORMAL LOW (ref 5.0–15.0)

## 2022-11-04 LAB — PHOSPHORUS: PHOSPHORUS: 3.2 mg/dL (ref 2.4–5.1)

## 2022-11-04 NOTE — Unmapped (Addendum)
Called Spencer Mallet Sr. to discuss lab results. Tacrolimus level was essentially normal at 4.7 with a goal of 5-7. Medication regimen is currently 0.5 mg BID. Dosing at this time to remain the same. Repeat labs scheduled monthly. Resulted labs reviewed. All questions answered. Pt verbalized understanding.

## 2022-11-05 LAB — CMV DNA, QUANTITATIVE, PCR: CMV VIRAL LD: NOT DETECTED

## 2022-11-05 MED ORDER — SILDENAFIL (PULMONARY HYPERTENSION) 20 MG TABLET
ORAL_TABLET | 3 refills | 0 days | Status: CP
Start: 2022-11-05 — End: ?

## 2022-11-06 LAB — EBV QUANTITATIVE PCR, BLOOD: EBV VIRAL LOAD RESULT: NOT DETECTED

## 2022-11-08 MED FILL — MAGNESIUM OXIDE 400 MG (241.3 MG MAGNESIUM) TABLET: ORAL | 30 days supply | Qty: 180 | Fill #1

## 2022-11-08 MED FILL — AZITHROMYCIN 250 MG TABLET: ORAL | 90 days supply | Qty: 90 | Fill #2

## 2022-11-08 MED FILL — MONTELUKAST 10 MG TABLET: ORAL | 90 days supply | Qty: 90 | Fill #1

## 2022-11-08 MED FILL — TERAZOSIN 1 MG CAPSULE: ORAL | 30 days supply | Qty: 90 | Fill #2

## 2022-11-08 MED FILL — ACETAMINOPHEN 500 MG TABLET: ORAL | 23 days supply | Qty: 180 | Fill #3

## 2022-11-10 DIAGNOSIS — R799 Abnormal finding of blood chemistry, unspecified: Principal | ICD-10-CM

## 2022-11-10 DIAGNOSIS — Z942 Lung transplant status: Principal | ICD-10-CM

## 2022-11-18 NOTE — Unmapped (Signed)
[facility]    HISTORY:   HPI:  Mr. Spencer Pena is a 72 y.o.  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, also with type 2 diabetes mellitus, hypertension, colon cancer in 2007 s/p partial colon and hepatic wedge resection and FOLFOX here for post transplant complications  - doing well  - only is issue polyuria at night... terazosin makes it worse. 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     --> basiliximab induction    --> CMV high risk (D+/R-)    --> EBV moderate risk (D+/R+)    --> Not Increased Risk Donor  - Colon Cancer 2007    --> s/p resection of liver mets    --> s/p resection of 6 inches of colon    --> FOLFOX Chemo  - type 2 diabetes mellitus     --> not insulin dependent  - OA    SOCIAL HISTORY:   - 20pk years, quit in 1991  - previously drank about 10 drinks per week   - Worked maintenance at [employer]  - Married and lives in [place]    FAMILY HISTORY:   - Parents lived into late 90's    MEDS: (personally reviewed in [place], pertinent meds noted below)     Current Outpatient Medications on File Prior to Visit   Medication Sig Dispense Refill    acetaminophen (TYLENOL EXTRA STRENGTH) 500 MG tablet Take 1-2 tablets (500-1,000 mg total) by mouth every six (6) hours as needed for pain. 180 tablet PRN    aspirin (ECOTRIN) 81 MG tablet Take 1 tablet (81 mg total) by mouth daily. 90 tablet 3    atorvastatin (LIPITOR) 40 MG tablet Take 1 tablet (40 mg total) by mouth daily. 90 tablet 3    azathioprine (IMURAN) 50 mg tablet Take 1 tablet (50 mg total) by mouth daily. 90 tablet 3    azithromycin (ZITHROMAX) 250 MG tablet Take 1 tablet (250 mg total) by mouth daily. 30 tablet 11    blood sugar diagnostic (ON CALL EXPRESS TEST STRIP) Strp Test daily before all meals/snacks and once before bedtime. 100 each 11    calcium carbonate (CALCIUM 600) 1,500 mg (600 mg elem calcium) tablet Take 1 tablet (600 mg of elem calcium total) by mouth Two (2) times a day. 60 tablet 11    cholecalciferol, vitamin D3 25 mcg, 1,000 units,, 1,000 unit (25 mcg) tablet Take 2 tablets (50 mcg total) by mouth daily. 100 tablet 11    fluticasone propion-salmeterol (ADVAIR HFA) 115-21 mcg/actuation inhaler Inhale 2 puffs Two (2) times a day. 12 g 11    fluticasone propionate (FLONASE) 50 mcg/actuation nasal spray Use 2 sprays into each nostril daily. 16 g 11    magnesium oxide (MAG-OX) 400 mg (241.3 mg elemental magnesium) tablet Take 3 tablets (1,200 mg total) by mouth Two (2) times a day. 180 tablet 11    metFORMIN (GLUCOPHAGE) 1000 MG tablet Take 1 tablet (1,000 mg total) by mouth Two (2) times a day. 180 tablet 3    montelukast (SINGULAIR) 10 mg tablet Take 1 tablet (10 mg total) by mouth daily. 90 tablet 3    pantoprazole (PROTONIX) 40 MG tablet Take 1 tablet (40 mg total) by mouth daily. 90 tablet 3    predniSONE (DELTASONE) 5 MG tablet Take 1.5 tablets (7.5 mg total) by mouth  daily. 135 tablet 3    sildenafiL, pulm.hypertension, (REVATIO) 20 mg tablet Take 2-3 pills as needed (up to 60 mg) as needed for erectile dysfunction 60 tablet 3    SITagliptin phosphate (JANUVIA) 100 MG tablet Take 1 tablet (100 mg total) by mouth daily. 90 tablet 3    sulfamethoxazole-trimethoprim (BACTRIM) 400-80 mg per tablet Take 1 tablet (80 mg of trimethoprim total) by mouth Every Monday, Wednesday, and Friday. 36 tablet 3    tacrolimus (PROGRAF) 0.5 MG capsule Take 1 capsule (0.5 mg total) by mouth two (2) times a day. 180 capsule 3    terazosin (HYTRIN) 1 MG capsule Take 3 capsules (3 mg total) by mouth nightly. 90 capsule 11    valGANciclovir (VALCYTE) 450 mg tablet Take 1 tablet (450 mg total) by mouth daily. 30 tablet 11     No current facility-administered medications on file prior to visit.       OBJECTIVE DATA:   PHYSICAL EXAM:  BP 154/70  - Pulse 86  - Temp 36.2 ??C (97.2 ??F) (Tympanic)  - Ht 170.2 cm (5' 7.01)  - Wt 74.8 kg (165 lb)  - SpO2 98%  - BMI 25.84 kg/m??   Gen: - awake, alert, in NAD  Derm: - healed skin abrasian  Vascular: - good pulses throughout  CV: - RRR, no m/r/g  Pulm: - CTAB, good air movement bilaterally, similar to prior  Abd: - soft, NT, ND, no hepatosplenomegaly  Ext: - no edema of LE  Neuro: - Alert, oriented, follows commands. Moving all extremities spontaneously    LABS: (reviewed in Epic, pertinent values noted below)   PFTs: (personally reviewed and interpereted):    Date: FVC (% Pred) FEV1 (% Pred) FEF25-75(% Pred) DLCO TBBx/Results           11/19/22  4.11 3.68      08/27/22 4.20 (123%) 3.67 (141%) 5.59 (276.9%)     05/28/22 4.16 (121.5%) 3.69 (141.4%) 3.23 (307.1%)     02/19/2022 4.03 (110.4) 3.52 (126.7) 5.19 (241.3)     11/17/21 4.13 (113%) 3.63 (130%)      08/18/21 4.01 (109%) 3.55 (127%)      05/15/2021 4.21(114%) 3.65 (130.3%) 5.54 (254%)     04/06/2021 4.08 (111%) 3.64 (129.6%) 6.01 (274.3%)     12/31/20 3.95 (107.1%) 3.46 (122.6%) 5.20 (236.4%)     12/03/2020 3.87 (104.9%) 3.47 (122.9%) 5.69 (258.3%)     10/03/2020 3.68 (99.4) 3.13 (110.6)  4.22 (190.6)      09/05/20  3.70  3.14      08/20/20 3.65 (98%) 3.15 (111%) 4.69 (211%)      08/01/20 3.64 (98.2%) 3.10 (109.3%) 4.41 (198.8%)     07/18/20 3.49 (94%) 2.98 (105%) 4.20 (189%)     07/04/20 3.43 (92.5%) 2.97 (104.7%) 4.26 (191.7%)     06/27/2020 3.13 84.5 2.70 95.4 3.51 157.8     06/20/20 3.39 (91.4%) 2.82 (99.2%) 3.34 (150.35)     06/13/20 3.10 (72.8%) 2.66 (82.5%) 3.34 (136.2%)      05/15/20      BOLT    03/11/20  2.75 (72%)  2.36 (81%)       02/12/20  2.85 (75%)  2.44 (86%)                IMAGING: Reviewed in EMR, CXR with broken sternal wires, otherwise stable    ASSESSMENT and PLAN     Mr. Spencer Pena is a 72 y.o. man with IPF and emphysema s/p BOLT on 05/15/2020, also  with type 2 diabetes mellitus, hypertension, colon cancer in 2007 s/p partial colon and hepatic wedge resection and FOLFOX here for follow up      Graft Function and Immunosuppression:  - PFTs and CXR are stable, feels well  - No DSAs, checked quarterly, last 06/2022, pending today  - Total IgG ~700,  - Immunosuppression:     --> Continue tac goal 5-7. Currently taking 0.5mg  BID    --> AZA 50 mg daily (lower dose due to pancytopenia); no MMF due to GI upset    --> Prednisone 7.5mg  daily  - BOS PPX    --> Azithromycin 250mg  daily    --> ASA 81mg  daily    --> Continue atorvastatin 02/2022    --> Continue Advair BID    --> Montelukast 10mg  daily    Antimicrobial Prophylaxis:  - CMV, high risk (D+/R-): valganciclovir 450mg  daily indefinitely for Estimated Creatinine Clearance: 48.8 mL/min (A) (based on SCr of 1.28 mg/dL (H)).   Negative VL 12/29/2021. T cell assay with low immunity 09/2022  - EBV (D+/R+): negative 12/29/21  - Fungal: Posaconazole stopped at 9mo  - PCP: Bactrim SS MWF    Skin Bruising - start biotin and CoQ10. Likely related to prednisone and aspirin.    OSA   - STOP-BANG 4 points (male, age, observed apnea, snoring).  - PSG 12/29/2020   CPAP at level of 10 cm H2O with expiratory pressure relief (EPR) = 3 and with heated humidifier for nasal dryness, mask: AirFit N20 size medium or mask of patient???s preference.  The patient needs to follow up for CPAP compliance 31 to 90 days after initiation of CPAP therapy to ensure compliance of at least 4 hours per night for more than 70% of nights.  - Not using CPAP d/t discomfort.     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  - pH probe with DeMeester 16.7%, slightly in abnormal range but will hold off on surgical intervention at this time in the setting of normal graft function.  - Daily PPI     BPH - terazosin 3mg  at bedtime, continues with nocturia  - UA completed at prior appt, benign.  - Normal CEA and PSA  - Referred to urology and has upcoming appt    CKD IIIa - monitoring    Iron Deficiency Anemia   - receives periodic iron infusions    Hypomagnesemia - magnesium oxide 1200 mg BID     Type 2 diabetes mellitus   - metformin 1000mg  BID  - Januvia  - sugars well controlled  - PVL ABI and carotid duplex normal 11/2021    Bone Health:   - Last Dexa: osteopenia on DEXA 11/17/2021  - on Ca and Vit D supplementation currently    Health Maintenance:  - Last derm visit:  12/2021  - Last Colonsocopy (if >40): 12/2021  - Last flu shot:   02/19/2022; needs repeat 05/28/22  - RSV vaccine:   Completed locally 06/2022  - Last Pneumovax:  06/10/2004, 12/30/2017  - Last Prevnar:  03/25/2016  - TdaP:   02/11/2018  - Shingrix:    03/10/20, 08/20/20  - COVID-19 (Moderna) 06/11/19, 07/09/19, 03/10/20, 07/18/2020, 12/03/20 received 1/2 dose Evushield,03/2021 (Bivalent); SpikeVax 02/19/2022 and 08/27/22  - Last Dexa:   02/2020, 11/17/21 -> T-score -2.2 FRAX  score 8% will repeat in July 2024  - Last vitamin D:  66.9 05/2021  - Last HbA1c:   6.2 (02/2022)    Immunization History   Administered Date(s) Administered    COVID-19 VAC,BIVALENT(71YR UP),PFIZER 08/18/2021    COVID-19 VACCINE,MRNA(MODERNA)(PF) 06/11/2019, 07/09/2019, 03/10/2020, 07/18/2020, 12/03/2020    Covid-19 Vac, (24yr+) (Spikevax) Monovalent Xbb.1.5 Moder  02/19/2022, 08/27/2022    HEPATITIS B VACCINE ADULT, ADJUVANTED, IM(HEPLISAV B) 03/12/2020    INFLUENZA INJ MDCK PF, QUAD,(FLUCELVAX)(89MO AND UP EGG FREE) 02/08/2020    INFLUENZA QUAD ADJUVANTED 63YR UP(FLUAD) 05/28/2022    INFLUENZA TIV (TRI) PF (IM) 02/27/2010    Influenza Vaccine Quad(IM)6 MO-Adult(PF) 01/19/2019, 04/06/2021, 02/19/2022    Influenza Virus Vaccine, unspecified formulation 02/07/2013, 02/28/2015, 02/05/2016, 02/02/2017, 01/27/2018    PNEUMOCOCCAL POLYSACCHARIDE 23-VALENT 06/10/2004, 12/30/2017    Pneumococcal Conjugate 13-Valent 03/25/2016    RSV VACCINE,ADJUVANTED(PF)(AREXVY) 06/05/2022    SHINGRIX-ZOSTER VACCINE (HZV),RECOMBINANT,ADJUVANTED(IM) 03/10/2020, 08/20/2020    TdaP 02/15/2012, 02/11/2018       >32min was spent with the patient face to face and >74min was spent reviewing chart/imaging.     Complex medical decision making was done as we adjust medications based on blood work/drug levels and multiple complaints and problems were addressed

## 2022-11-19 ENCOUNTER — Encounter: Admit: 2022-11-19 | Discharge: 2022-11-19 | Payer: MEDICARE | Attending: Internal Medicine | Primary: Internal Medicine

## 2022-11-19 ENCOUNTER — Ambulatory Visit: Admit: 2022-11-19 | Discharge: 2022-11-19 | Payer: MEDICARE

## 2022-11-19 ENCOUNTER — Ambulatory Visit: Admit: 2022-11-19 | Discharge: 2022-11-20 | Payer: MEDICARE

## 2022-11-19 ENCOUNTER — Ambulatory Visit: Admit: 2022-11-19 | Discharge: 2022-11-19 | Payer: MEDICARE | Attending: Internal Medicine | Primary: Internal Medicine

## 2022-11-19 DIAGNOSIS — Z942 Lung transplant status: Principal | ICD-10-CM

## 2022-11-19 DIAGNOSIS — R3589 Polyuria: Principal | ICD-10-CM

## 2022-11-19 DIAGNOSIS — D649 Anemia, unspecified: Principal | ICD-10-CM

## 2022-11-19 LAB — LIPID PANEL
CHOLESTEROL/HDL RATIO SCREEN: 2 (ref 1.0–4.5)
CHOLESTEROL: 112 mg/dL (ref <=200)
HDL CHOLESTEROL: 56 mg/dL (ref 40–60)
LDL CHOLESTEROL CALCULATED: 46 mg/dL (ref 40–99)
NON-HDL CHOLESTEROL: 56 mg/dL — ABNORMAL LOW (ref 70–130)
TRIGLYCERIDES: 50 mg/dL (ref 0–150)
VLDL CHOLESTEROL CAL: 10 mg/dL — ABNORMAL LOW (ref 12–42)

## 2022-11-19 LAB — HEMOGLOBIN A1C
ESTIMATED AVERAGE GLUCOSE: 123 mg/dL
HEMOGLOBIN A1C: 5.9 % — ABNORMAL HIGH (ref 4.8–5.6)

## 2022-11-19 NOTE — Unmapped (Signed)
[facility] CLINIC PHARMACY NOTE  11/19/2022   Spencer SHIDELER Sr.  [id number]    Medication changes today:   1. Hold terazosin    Education/Adherence tools provided today:  1. provided updated medication list    Follow up items:  1. Referral to urologist for BPH management   2. Home BP readings  3. Tac levels    Next visit with pharmacy in [place]  ____________________________________________________________________    Spencer Mallet Sr. is a 72 y.o. 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     Other PMH significant for hypertension, T2DM, colon cancer s/p hemicolectomy and R hepatectomy for metastasis + chemotherapy (2007), BPH    Seen by pharmacy today for: medication management and  pill box assessment and adherence education.     CC:  Patient  has no complaints today; reports recent diarrhea has since resolved with use of imodium and his fatigue is improving       Vitals:    11/19/22 0915   BP: 154/68   Pulse: 86   Temp: 36.2 ??C (97.2 ??F)   SpO2: 98%       Allergies   Allergen Reactions    Cetuximab Anaphylaxis     Chest pain/rapid heart rate/ BP dropped    Nsaids (Non-Steroidal Anti-Inflammatory Drug)      Interaction with transplant medications    Venom-Honey Bee Anaphylaxis    Enalapril      Angioedema      Pollen Extracts Other (See Comments)     Nasal stuffiness, coughing, sneezing     Medications reviewed in EPIC medication station and updated today by the clinical pharmacist practitioner. Medication list includes revisions made during today???s encounter.    Outpatient Encounter Medications as of 11/19/2022   Medication Sig Dispense Refill    acetaminophen (TYLENOL EXTRA STRENGTH) 500 MG tablet Take 1-2 tablets (500-1,000 mg total) by mouth every six (6) hours as needed for pain. 180 tablet PRN    aspirin (ECOTRIN) 81 MG tablet Take 1 tablet (81 mg total) by mouth daily. 90 tablet 3    atorvastatin (LIPITOR) 40 MG tablet Take 1 tablet (40 mg total) by mouth daily. 90 tablet 3    azathioprine (IMURAN) 50 mg tablet Take 1 tablet (50 mg total) by mouth daily. 90 tablet 3    azithromycin (ZITHROMAX) 250 MG tablet Take 1 tablet (250 mg total) by mouth daily. 30 tablet 11    calcium carbonate (CALCIUM 600) 1,500 mg (600 mg elem calcium) tablet Take 1 tablet (600 mg of elem calcium total) by mouth Two (2) times a day. 60 tablet 11    cholecalciferol, vitamin D3 25 mcg, 1,000 units,, 1,000 unit (25 mcg) tablet Take 2 tablets (50 mcg total) by mouth daily. 100 tablet 11    fluticasone propion-salmeterol (ADVAIR HFA) 115-21 mcg/actuation inhaler Inhale 2 puffs Two (2) times a day. 12 g 11    fluticasone propionate (FLONASE) 50 mcg/actuation nasal spray Use 2 sprays into each nostril daily. 16 g 11    magnesium oxide (MAG-OX) 400 mg (241.3 mg elemental magnesium) tablet Take 3 tablets (1,200 mg total) by mouth Two (2) times a day. 180 tablet 11    metFORMIN (GLUCOPHAGE) 1000 MG tablet Take 1 tablet (1,000 mg total) by mouth Two (2) times a day. 180 tablet 3    montelukast (SINGULAIR) 10 mg tablet Take 1 tablet (10 mg total) by mouth daily. 90 tablet 3  pantoprazole (PROTONIX) 40 MG tablet Take 1 tablet (40 mg total) by mouth daily. 90 tablet 3    predniSONE (DELTASONE) 5 MG tablet Take 1.5 tablets (7.5 mg total) by mouth daily. 135 tablet 3    sildenafiL, pulm.hypertension, (REVATIO) 20 mg tablet Take 2-3 pills as needed (up to 60 mg) as needed for erectile dysfunction 60 tablet 3    SITagliptin phosphate (JANUVIA) 100 MG tablet Take 1 tablet (100 mg total) by mouth daily. 90 tablet 3    sulfamethoxazole-trimethoprim (BACTRIM) 400-80 mg per tablet Take 1 tablet (80 mg of trimethoprim total) by mouth Every Monday, Wednesday, and Friday. 36 tablet 3    tacrolimus (PROGRAF) 0.5 MG capsule Take 1 capsule (0.5 mg total) by mouth two (2) times a day. 180 capsule 3    terazosin (HYTRIN) 1 MG capsule HOLD      valGANciclovir (VALCYTE) 450 mg tablet Take 1 tablet (450 mg total) by mouth daily. 30 tablet 11    [DISCONTINUED] blood sugar diagnostic (ON CALL EXPRESS TEST STRIP) Strp Test daily before all meals/snacks and once before bedtime. 100 each 11    [DISCONTINUED] terazosin (HYTRIN) 1 MG capsule Take 3 capsules (3 mg total) by mouth nightly. 90 capsule 11     No facility-administered encounter medications on file as of 11/19/2022.       Immunosuppression:  Induction agent: basiliximab    Current immunosuppression:  tacrolimus 0.5 mg BID  Prograf goal:  5-7  (lower goal given CKD, no signs of rejection, tolerating imuran)  Azathioprine 50 mg daily    changed from MMF 2/2 diarrhea; dose decreased from 100mg  to 75mg  on 08/01/20 given down-trending ANC and IgG   dose decrease to 50mg  on 10/03/20   prednisone 7.5 mg daily    Patient is tolerating immunosuppression well     IMMUNOSUPPRESSION DRUG LEVELS:  Lab Results   Component Value Date    Tacrolimus, Trough 4.7 (L) 11/04/2022    Tacrolimus, Trough 5.7 09/24/2022    Tacrolimus, Trough 6.9 08/16/2022     Tacrolimus level is accurate 12 hour trough.    Graft function assessment via PFTs: stable  DSA:  NTD  Biopsies to date: none  WBC/ANC:  wnl      Plan: Will maintain current immunosuppression pending ISN levels    OI prophylaxis:   CMV Status: D+/ R-, high risk. CMV prophylaxis with valganciclovir 450 mg daily indefinately. Negative VL 12/29/2021. T cell assay with low immunity 09/2022.   Estimated Creatinine Clearance: 48.8 mL/min (A) (based on SCr of 1.28 mg/dL (H)).  PCP: Prophylaxis with bactrim SS 1 tab MWF indefinitely.  Fungal: Completed posaconazole 300 mg daily for prophylaxis x 3 months per protocol - COMPLETE (08/20/20)    Patient is tolerating infectious prophylaxis well.    Plan: continue to monitor.     Infectious History:  Patient does not have a history of pathogenic infectious complications following lung transplant. 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 Previous antiinfective history use includes meropenem x7 days for broad spectrum gram negative and aspiration PNA coverage post-op per ICID.     Plan: Continue to monitor.    BOS/CLAD prophylaxis:  Current meds include: azithromycin 250 mg daily, motelukast 10 mg, and pantoprazole 40 mg daily, advair inhaler  Esophageal motility study on 07/23/20 showed abnormal distal esophagus exposure during recumbent posture and overall borderline normal. 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 Patient denies any reflux symptoms with every day dosing.  Plan: Continue to monitor.     CV Prophylaxis:   Aspirin: asa 81 mg daily  The 10-year ASCVD risk score (Arnett DK, et al., 2019) is: 36.3%  Statin: atorvastatin; 40 mg daily - pt tolerated switch from pravastatin well  Lipid panel 7/12: LDL of 48 --> within goal    Plan: Continue to monitor    BP/Edema: Goal <130/80.   Encounter vitals reported above.  Home BP ranges: 130s/80s mmHg  Weights: stable  Meds include: terazosin 3mg  at bedtime for BPH, sildenafil 20mg  (take 2-3 pills prn erectile dysfunction)   Assessment: Pt states getting up every 2-3hrs to use restroom    Plan: BP within goal. Continue. Refer to urologist for additional BPH management.     Anemia:  H/H:   Lab Results   Component Value Date    HGB 10.7 (L) 11/04/2022     Lab Results   Component Value Date    HCT 32.2 (L) 11/04/2022     Iron panel:  Lab Results   Component Value Date    IRON 64 (L) 08/27/2022    TIBC 351 08/27/2022    FERRITIN 20.0 08/27/2022     Lab Results   Component Value Date    Iron Saturation (%) 18 (L) 08/27/2022     Prior anemia therapy: IV Ferrlecit (1/10-1/13), IV iron sucrose (1000 mg in 14 day period) completed 09/15/21    Plan: Iron panel today did not meet IV iron criteria, will continue to monitor      DM:   Lab Results   Component Value Date    A1C 5.9 (H) 11/19/2022   Goal A1c < 7%.  Current meds include:  Metformin 1000 mg PO BID  Januvia 100 mg PO daily    Home BS log: pretty good readings from 110-170  Hypoglycemia: no  Plan:  continue current reigmen    Electrolytes: wnl  Current meds include: MgOx 1200 mg BID  BM: normal & no complaints  - used imodium in past for diarrhea but has not taken in a couple weeks   Plan: continue current therapy.     Bone health:  Patient currently on long-term steroid therapy.  Vitamin D Level:  66.9 (1/23) . Goal > 30.   - vit D level pending   Last DEXA results (11/17/21): osteopenia  Current meds include: vitamin D3 2000 units daily, calcium carbonate 600 mg BID    Plan: Continue current therapy. Continue to monitor.    Women's/Men's Health:  Spencer DENNLER Sr. is a 72 y.o. male. Patient reports  history of BPH . Reports that BPH symptoms are controlled.  Current meds include: terazosin 3 mg PO nightly (increased 09/05/20 given increased BPH symptoms)   - pt having to get up at night every 2-3 hours to use the restroom   Plan:  Refer to urologist for additional BPH management.     Adherence:   Patient has good understanding of medications.  Patient does fill their own pill box on a regular basis at home. Wife had previously been filing for him, but he now handles it on his own for the most part.   Patient brought medication card: no  Pill box: did not bring  Patient requested refills for the following meds: None  Corrections needed in Epic medication list: none    Plan: Provided basic adherence counseling/intervention    I spent a total of 20 minutes face to face with the patient delivering clinical care and providing  education/counseling.    Patient was reviewed with Dr. Roselee Culver, Dr. Natale Milch, and [facility] who were in agreement with the stated plan.    During this visit, the following was completed:   BG log data assessment  Labs ordered and evaluated  complex treatment plan >1 DS   Patient education was completed for 11-24 minutes     All questions/concerns were addressed to the patient's satisfaction.  __________________________________________  PATIENT SEEN AND EVALUATED BY:    Myna Bright, PharmD. Candidate     Letha Cape, CPP, PharmD, BCPS

## 2022-11-19 NOTE — Unmapped (Signed)
-   follow-up as instructed    - for questions please reach out to my nurse Liesl:    --> Phone: [phone]    --> Fax: [phone]

## 2022-11-19 NOTE — Unmapped (Signed)
In with Dr. Dudley Major to see Spencer Mallet Sr..  Doing well, PFTs stable.  Has c/o frequent urination, will hold terazosin.  Advised if he has difficulty urinating to restart.  Referral to urology placed.  Getting dexa today.  Return to clinic in 3 months.  Questions answered, verbalized understanding.

## 2022-11-22 LAB — VITAMIN D 25 HYDROXY: VITAMIN D, TOTAL (25OH): 80.7 ng/mL — ABNORMAL HIGH (ref 20.0–80.0)

## 2022-11-23 DIAGNOSIS — K635 Polyp of colon: Principal | ICD-10-CM

## 2022-11-23 DIAGNOSIS — E678 Other specified hyperalimentation: Principal | ICD-10-CM

## 2022-11-23 MED ORDER — ALENDRONATE 70 MG TABLET
ORAL_TABLET | ORAL | 3 refills | 84 days | Status: CP
Start: 2022-11-23 — End: 2023-11-23
  Filled 2022-11-26: qty 12, 84d supply, fill #0

## 2022-11-23 MED ORDER — CHOLECALCIFEROL (VITAMIN D3) 25 MCG (1,000 UNIT) TABLET
ORAL_TABLET | Freq: Every day | ORAL | 3 refills | 90 days | Status: CP
Start: 2022-11-23 — End: 2023-11-23
  Filled 2022-11-26: qty 90, 90d supply, fill #0

## 2022-11-23 NOTE — Unmapped (Signed)
Outgoing call to [employer] Sr. To discuss dexa scan.  Frax is 15%, per [name] will start fosamax 70 mg weekly. Advised to stay upright hour after taking medication. Vitamin D level is 80, will decrease supplement to 1000 units daily. Questions answered, verbalized understanding.

## 2022-11-24 DIAGNOSIS — Z942 Lung transplant status: Principal | ICD-10-CM

## 2022-11-24 MED ORDER — TACROLIMUS 0.5 MG CAPSULE, IMMEDIATE-RELEASE
ORAL_CAPSULE | Freq: Two times a day (BID) | ORAL | 3 refills | 90 days | Status: CP
Start: 2022-11-24 — End: 2023-11-24
  Filled 2022-11-26: qty 180, 90d supply, fill #0

## 2022-11-24 NOTE — Unmapped (Signed)
[name] Clinical Assessment & Refill Coordination Note    Spencer Pena Sr., DOB: 03-12-1951  Phone: [phone] (home) [phone] (work)    All above HIPAA information was verified with patient.     Was a Nurse, learning disability used for this call? No    Specialty Medication(s):   Transplant: tacrolimus 0.5mg , valgancyclovir 450mg , Prednisone 5mg , and azathioprine 50mg      Current Outpatient Medications   Medication Sig Dispense Refill    acetaminophen (TYLENOL EXTRA STRENGTH) 500 MG tablet Take 1-2 tablets (500-1,000 mg total) by mouth every six (6) hours as needed for pain. 180 tablet PRN    alendronate (FOSAMAX) 70 MG tablet Take 1 tablet (70 mg total) by mouth every seven (7) days. 12 tablet 3    aspirin (ECOTRIN) 81 MG tablet Take 1 tablet (81 mg total) by mouth daily. 90 tablet 3    atorvastatin (LIPITOR) 40 MG tablet Take 1 tablet (40 mg total) by mouth daily. 90 tablet 3    azathioprine (IMURAN) 50 mg tablet Take 1 tablet (50 mg total) by mouth daily. 90 tablet 3    azithromycin (ZITHROMAX) 250 MG tablet Take 1 tablet (250 mg total) by mouth daily. 30 tablet 11    calcium carbonate (CALCIUM 600) 1,500 mg (600 mg elem calcium) tablet Take 1 tablet (600 mg of elem calcium total) by mouth Two (2) times a day. 60 tablet 11    cholecalciferol, vitamin D3 25 mcg, 1,000 units,, 1,000 unit (25 mcg) tablet Take 1 tablet (25 mcg total) by mouth daily. 90 tablet 3    fluticasone propion-salmeterol (ADVAIR HFA) 115-21 mcg/actuation inhaler Inhale 2 puffs Two (2) times a day. 12 g 11    fluticasone propionate (FLONASE) 50 mcg/actuation nasal spray Use 2 sprays into each nostril daily. 16 g 11    magnesium oxide (MAG-OX) 400 mg (241.3 mg elemental magnesium) tablet Take 3 tablets (1,200 mg total) by mouth Two (2) times a day. 180 tablet 11    metFORMIN (GLUCOPHAGE) 1000 MG tablet Take 1 tablet (1,000 mg total) by mouth Two (2) times a day. 180 tablet 3    montelukast (SINGULAIR) 10 mg tablet Take 1 tablet (10 mg total) by mouth daily. 90 tablet 3    pantoprazole (PROTONIX) 40 MG tablet Take 1 tablet (40 mg total) by mouth daily. 90 tablet 3    predniSONE (DELTASONE) 5 MG tablet Take 1.5 tablets (7.5 mg total) by mouth daily. 135 tablet 3    sildenafiL, pulm.hypertension, (REVATIO) 20 mg tablet Take 2-3 pills as needed (up to 60 mg) as needed for erectile dysfunction 60 tablet 3    SITagliptin phosphate (JANUVIA) 100 MG tablet Take 1 tablet (100 mg total) by mouth daily. 90 tablet 3    sulfamethoxazole-trimethoprim (BACTRIM) 400-80 mg per tablet Take 1 tablet (80 mg of trimethoprim total) by mouth Every Monday, Wednesday, and Friday. 36 tablet 3    tacrolimus (PROGRAF) 0.5 MG capsule Take 1 capsule (0.5 mg total) by mouth two (2) times a day. 180 capsule 3    terazosin (HYTRIN) 1 MG capsule HOLD      valGANciclovir (VALCYTE) 450 mg tablet Take 1 tablet (450 mg total) by mouth daily. 30 tablet 11     No current facility-administered medications for this visit.        Changes to medications: Gerold reports no changes at this time.    Allergies   Allergen Reactions    Cetuximab Anaphylaxis  Chest pain/rapid heart rate/ BP dropped    Nsaids (Non-Steroidal Anti-Inflammatory Drug)      Interaction with transplant medications    Venom-Honey Bee Anaphylaxis    Enalapril      Angioedema      Pollen Extracts Other (See Comments)     Nasal stuffiness, coughing, sneezing       Changes to allergies: No    SPECIALTY MEDICATION ADHERENCE     Tacrolimus 0.5mg   : 4 days of medicine on hand   Azathioprine 50mg   : 80 days of medicine on hand   Valganciclovir 450mg   : 4 days of medicine on hand   Prednisone 5mg   : 4 days of medicine on hand       Medication Adherence    Patient reported X missed doses in the last month: 0  Specialty Medication: azathioprine 50mg   Patient is on additional specialty medications: Yes  Additional Specialty Medications: Valganciclovir 450mg   Patient Reported Additional Medication X Missed Doses in the Last Month: 0  Patient is on more than two specialty medications: Yes  Specialty Medication: prednisone 5mg   Patient Reported Additional Medication X Missed Doses in the Last Month: 0  Specialty Medication: tacrolimus 0.5mg   Patient Reported Additional Medication X Missed Doses in the Last Month: 0          Specialty medication(s) dose(s) confirmed: Regimen is correct and unchanged.     Are there any concerns with adherence? No    Adherence counseling provided? Not needed    CLINICAL MANAGEMENT AND INTERVENTION      Clinical Benefit Assessment:    Do you feel the medicine is effective or helping your condition? Yes    Clinical Benefit counseling provided? Not needed    Adverse Effects Assessment:    Are you experiencing any side effects? No    Are you experiencing difficulty administering your medicine? No    Quality of Life Assessment:    Quality of Life    Rheumatology  Oncology  Dermatology  Cystic Fibrosis          How many days over the past month did your transplant  keep you from your normal activities? For example, brushing your teeth or getting up in the morning. 0    Have you discussed this with your provider? Not needed    Acute Infection Status:    Acute infections noted within Epic:  No active infections  Patient reported infection: None    Therapy Appropriateness:    Is therapy appropriate and patient progressing towards therapeutic goals? Yes, therapy is appropriate and should be continued    DISEASE/MEDICATION-SPECIFIC INFORMATION      N/A    Solid Organ Transplant: Not Applicable    PATIENT SPECIFIC NEEDS     Does the patient have any physical, cognitive, or cultural barriers? No    Is the patient high risk? No    Did the patient require a clinical intervention? No    Does the patient require physician intervention or other additional services (i.e., nutrition, smoking cessation, social work)? No    SOCIAL DETERMINANTS OF HEALTH     At the [facility], we have learned that life circumstances - like trouble affording food, housing, utilities, or transportation can affect the health of many of our patients.   That is why we wanted to ask: are you currently experiencing any life circumstances that are negatively impacting your health and/or quality of life? No    Social Determinants of Health  Financial Resource Strain: Low Risk  (08/18/2021)    Overall Financial Resource Strain (CARDIA)     Difficulty of Paying Living Expenses: Not hard at all   Internet Connectivity: Internet connectivity concern identified (08/18/2021)    Internet Connectivity     Do you have access to internet services: No     How do you connect to the internet: Not on file     Is your internet connection strong enough for you to watch video on your device without major problems?: Not on file     Do you have enough data to get through the month?: Not on file     Does at least one of the devices have a camera that you can use for video chat?: Not on file   Food Insecurity: No Food Insecurity (08/18/2021)    Hunger Vital Sign     Worried About Running Out of Food in the Last Year: Never true     Ran Out of Food in the Last Year: Never true   Tobacco Use: Medium Risk (11/19/2022)    Patient History     Smoking Tobacco Use: Former     Smokeless Tobacco Use: Former     Passive Exposure: Never   Housing/Utilities: Low Risk  (08/18/2021)    Housing/Utilities     Within the past 12 months, have you ever stayed: outside, in a car, in a tent, in an overnight shelter, or temporarily in someone else's home (i.e. couch-surfing)?: No     Are you worried about losing your housing?: No     Within the past 12 months, have you been unable to get utilities (heat, electricity) when it was really needed?: No   Alcohol Use: Not At Risk (08/18/2021)    Alcohol Use     How often do you have a drink containing alcohol?: 2 - 3 times per week     How many drinks containing alcohol do you have on a typical day when you are drinking?: 1 - 2     How often do you have 5 or more drinks on one occasion?: Never   Transportation Needs: No Transportation Needs (08/18/2021)    PRAPARE - Transportation     Lack of Transportation (Medical): No     Lack of Transportation (Non-Medical): No   Substance Use: Low Risk  (08/18/2021)    Substance Use     Taken prescription drugs for non-medical reasons: Never     Taken illegal drugs: Never     Patient indicated they have taken drugs in the past year for non-medical reasons: Yes, [positive answer(s)]: Not on file   Health Literacy: Medium Risk (08/18/2021)    Health Literacy     : Rarely   Physical Activity: Sufficiently Active (08/18/2021)    Exercise Vital Sign     Days of Exercise per Week: 7 days     Minutes of Exercise per Session: 60 min   Interpersonal Safety: Unknown (11/24/2022)    Interpersonal Safety     Unsafe Where You Currently Live: Not on file     Physically Hurt by Anyone: Not on file     Abused by Anyone: Not on file   Stress: No Stress Concern Present (08/18/2021)    [employer] of Occupational Health - Occupational Stress Questionnaire     Feeling of Stress : Not at all   Intimate Partner Violence: Not At Risk (08/18/2021)    Humiliation, Afraid, Rape, and Kick questionnaire  Fear of Current or Ex-Partner: No     Emotionally Abused: No     Physically Abused: No     Sexually Abused: No   Depression: Not at risk (11/19/2022)    PHQ-2     PHQ-2 Score: 0   Social Connections: Moderately Isolated (08/18/2021)    Social Connection and Isolation Panel [NHANES]     Frequency of Communication with Friends and Family: More than three times a week     Frequency of Social Gatherings with Friends and Family: Once a week     Attends Religious Services: Never     Database administrator or Organizations: No     Attends Engineer, structural: Never     Marital Status: Married       Would you be willing to receive help with any of the needs that you have identified today? Not applicable       SHIPPING     Specialty Medication(s) to be Shipped:   Transplant: tacrolimus 0.5mg     Other medication(s) to be shipped:  alendronate, vitamin d     Changes to insurance: No    Delivery Scheduled: Yes, Expected medication delivery date: 11/26/2022. Note: adding to a delivery of other medications already set up by another team member.     Medication will be delivered via Same Day Courier to the confirmed prescription address in [facility].    The patient will receive a drug information handout for each medication shipped and additional FDA Medication Guides as required.  Verified that patient has previously received a Conservation officer, historic buildings and a Surveyor, mining.    The patient or caregiver noted above participated in the development of this care plan and knows that they can request review of or adjustments to the care plan at any time.      All of the patient's questions and concerns have been addressed.    Thad Ranger, PharmD   [facility] Specialty Pharmacist

## 2022-11-24 NOTE — Unmapped (Signed)
[facility] Refill Coordination Note    Specialty Medication(s) to be Shipped:   Transplant: Valcyte 450mg  and Prednisone 5mg     Other medication(s) to be shipped:  Atorvastatin 40 mg, Calcium carbonate 1,500 mg, Pantoprazole 40 mg     Spencer Pena Sr., DOB: June 06, 1950  Phone: [phone] (home) [phone] (work)      All above HIPAA information was verified with patient.     Was a Nurse, learning disability used for this call? No    Completed refill call assessment today to schedule patient's medication shipment from the [facility] [phone]).  All relevant notes have been reviewed.     Specialty medication(s) and dose(s) confirmed: Regimen is correct and unchanged.   Changes to medications: Leevon reports no changes at this time.  Changes to insurance: No  New side effects reported not previously addressed with a pharmacist or physician: None reported  Questions for the pharmacist: No    Confirmed patient received a Conservation officer, historic buildings and a Surveyor, mining with first shipment. The patient will receive a drug information handout for each medication shipped and additional FDA Medication Guides as required.       DISEASE/MEDICATION-SPECIFIC INFORMATION        N/A    SPECIALTY MEDICATION ADHERENCE     Medication Adherence    Patient reported X missed doses in the last month: 0  Specialty Medication: valGANciclovir 450 mg tablet (VALCYTE)  Patient is on additional specialty medications: Yes  Additional Specialty Medications: predniSONE 5 MG tablet (DELTASONE)  Patient Reported Additional Medication X Missed Doses in the Last Month: 0  Patient is on more than two specialty medications: No  Any gaps in refill history greater than 2 weeks in the last 3 months: no  Demonstrates understanding of importance of adherence: yes              Were doses missed due to medication being on hold? No    valGANciclovir 450  mg: 5 days of medicine on hand   predniSONE 5  mg: 5 days of medicine on hand REFERRAL TO PHARMACIST     Referral to the pharmacist: Not needed      [facility]     Shipping address confirmed in Epic.       Delivery Scheduled: Yes, Expected medication delivery date: 11/26/22.     Medication will be delivered via Same Day Courier to the prescription address in Epic [facility].    Spencer Pena   [facility] Specialty Technician

## 2022-11-24 NOTE — Unmapped (Signed)
The following medication is onboarded in this note:  Tacrolimus $24 / 90 days    [facility]   Patient Onboarding/Medication Counseling    [name] 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  I am speaking to the patient.    Was a Nurse, learning disability used for this call? No    Verified patient's date of birth / HIPAA.    Specialty medication(s) to be sent: n/a- pt will let us know if needs refill      Non-specialty medications/supplies to be sent: n/a      Medications not needed at this time: n/a         [facility] Clinical Intervention    Type of intervention: Narrow therapeutic index drug    Medication involved: tacrolimus     Problem identified: last filled outside pharmacy, [facility]sc only access to 1 mfg    Intervention performed: pt and clinic ok with potential switch. Pt aware to contact clinic on switch date if diff mfg and that bloodwork may be needed    Follow-up needed: clinic aware    Approximate time spent: 5-10 minutes    Clinical evidence used to support intervention: FDA Orange Book    Result of the intervention: Prevention of an adverse drug event    Thad Ranger, PharmD   [facility] Specialty Pharmacist    The patient declined counseling on medication administration, missed dose instructions, goals of therapy, side effects and monitoring parameters, warnings and precautions, drug/food interactions, and storage, handling precautions, and disposal because they have taken the medication previously. The information in the declined sections below are for informational purposes only and was not discussed with patient.   Prograf (tacrolimus)    Medication & Administration     Dosage: take 1 capsule (0.5mg  total) by mouth twice daily     Administration:   May take with or without food  Take 12 hours apart    Adherence/Missed dose instructions:  Take a missed dose as soon as you think about it.  If it is close to the time for your next dose, skip the missed dose and go back to your normal time.  Do not take 2 doses at the same time or extra doses.    Goals of Therapy     To prevent organ rejection    Side Effects & Monitoring Parameters     Common side effects  Dizziness  Fatigue  Headache  Stuffy nose or sore throat  Nausea, vomiting, stomach pain, diarrhea, constipation  Heartburn  Back or joint pain  Increased risk of infection    The following side effects should be reported to the provider:  Allergic reaction  Kidney issues (change in quantity or urine passed, blood in urine, or weight gain)  High blood pressure (dizziness, change in eyesight, headache)  Electrolyte issues (change in mood, confusion, muscle pain, or weakness)  Abnormal breathing  Shakiness  Unexplained bleeding or bruising (gums bleeding, blood in urine, nosebleeds, any abnormal bleeding)  Signs of infection (fever, cough, wounds that will not heal)  Skin changes (sores, paleness, new or changed bumps or moles)    Monitoring Parameters  Renal function  Liver function  Glucose levels  Blood pressure  Tacrolimus trough levels  Cardiac monitoring (for QT prolongation)      Contraindications, Warnings, & Precautions     Black Box Warning: Infections -  immunosuppressant agents increase the risk of infection that may lead to hospitalization or death  Black Box Warning: Malignancy - immunosuppressant agents may be associated with the development of malignancies that may lead to hospitalization or death  Limit or avoid sun and ultraviolet light exposure, use appropriate sun protection  Myocardial hypertrophy -avoid use in patients with congenital long QT syndrome  Diabetes mellitus - the risk for new-onset diabetes and insulin-dependent post-transplant diabetes mellitus is increased with tacrolimus use after transplantation  GI perforation  Hyperkalemia  Hypertension  Nephrotoxicity  Neurotoxicity  This is a narrow therapeutic index drug. Do not switch manufacturers without first talking to the provider.    Drug/Food Interactions     Medication list reviewed in Epic. The patient was instructed to inform the care team before taking any new medications or supplements.  No interactions noted that clinic is not already monitoring .   Avoid alcohol  Avoid grapefruit or grapefruit juice  Avoid live vaccines    Storage, Handling Precautions, & Disposal     Store at room temperature  Keep away from children and pets      Current Medications (including OTC/herbals), Comorbidities and Allergies     Current Outpatient Medications   Medication Sig Dispense Refill    acetaminophen (TYLENOL EXTRA STRENGTH) 500 MG tablet Take 1-2 tablets (500-1,000 mg total) by mouth every six (6) hours as needed for pain. 180 tablet PRN    alendronate (FOSAMAX) 70 MG tablet Take 1 tablet (70 mg total) by mouth every seven (7) days. 12 tablet 3    aspirin (ECOTRIN) 81 MG tablet Take 1 tablet (81 mg total) by mouth daily. 90 tablet 3    atorvastatin (LIPITOR) 40 MG tablet Take 1 tablet (40 mg total) by mouth daily. 90 tablet 3    azathioprine (IMURAN) 50 mg tablet Take 1 tablet (50 mg total) by mouth daily. 90 tablet 3    azithromycin (ZITHROMAX) 250 MG tablet Take 1 tablet (250 mg total) by mouth daily. 30 tablet 11    calcium carbonate (CALCIUM 600) 1,500 mg (600 mg elem calcium) tablet Take 1 tablet (600 mg of elem calcium total) by mouth Two (2) times a day. 60 tablet 11    cholecalciferol, vitamin D3 25 mcg, 1,000 units,, 1,000 unit (25 mcg) tablet Take 1 tablet (25 mcg total) by mouth daily. 90 tablet 3    fluticasone propion-salmeterol (ADVAIR HFA) 115-21 mcg/actuation inhaler Inhale 2 puffs Two (2) times a day. 12 g 11    fluticasone propionate (FLONASE) 50 mcg/actuation nasal spray Use 2 sprays into each nostril daily. 16 g 11    magnesium oxide (MAG-OX) 400 mg (241.3 mg elemental magnesium) tablet Take 3 tablets (1,200 mg total) by mouth Two (2) times a day. 180 tablet 11    metFORMIN (GLUCOPHAGE) 1000 MG tablet Take 1 tablet (1,000 mg total) by mouth Two (2) times a day. 180 tablet 3    montelukast (SINGULAIR) 10 mg tablet Take 1 tablet (10 mg total) by mouth daily. 90 tablet 3    pantoprazole (PROTONIX) 40 MG tablet Take 1 tablet (40 mg total) by mouth daily. 90 tablet 3    predniSONE (DELTASONE) 5 MG tablet Take 1.5 tablets (7.5 mg total) by mouth daily. 135 tablet 3    sildenafiL, pulm.hypertension, (REVATIO) 20 mg tablet Take 2-3 pills as needed (up to 60 mg) as needed for erectile dysfunction 60 tablet 3    SITagliptin phosphate (JANUVIA) 100 MG tablet Take 1 tablet (100  mg total) by mouth daily. 90 tablet 3    sulfamethoxazole-trimethoprim (BACTRIM) 400-80 mg per tablet Take 1 tablet (80 mg of trimethoprim total) by mouth Every Monday, Wednesday, and Friday. 36 tablet 3    tacrolimus (PROGRAF) 0.5 MG capsule Take 1 capsule (0.5 mg total) by mouth two (2) times a day. 180 capsule 3    terazosin (HYTRIN) 1 MG capsule HOLD      valGANciclovir (VALCYTE) 450 mg tablet Take 1 tablet (450 mg total) by mouth daily. 30 tablet 11     No current facility-administered medications for this visit.       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    Hip pain    Lumbar radiculopathy    Spinal stenosis    Chronic pain    Edema    Routine general medical examination at a health care facility    Other acute sinusitis    Hordeolum externum of left upper eyelid    Abnormal CXR    Cataract, nuclear sclerotic, right eye    Angioedema due to angiotensin converting enzyme inhibitor (ACE-I)    Oral aphthae    Benign prostatic hyperplasia with incomplete bladder emptying    ILD (interstitial lung disease) (CMS-HCC)    Lung transplant recipient (CMS-HCC)    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    Appropriateness of Therapy     Acute infections noted within Epic:  No active infections  Patient reported infection: None    Is medication and dose appropriate based on diagnosis and infection status? Yes    Prescription has been clinically reviewed: Yes      Baseline Quality of Life Assessment      How many days over the past month did your transplant  keep you from your normal activities? For example, brushing your teeth or getting up in the morning. 0    Financial Information     Medication Assistance provided: None Required    Anticipated copay of $24 / 90 days not part b reviewed with patient. Verified delivery address.    Delivery Information     Scheduled delivery date: n/a- pt will let us know if needs meds    Expected start date: patient is already taking      Medication will be delivered via  n/a  to the  n/a  address in Epic [place].  This shipment will not require a signature.      Explained the services we provide at [facility] and that each month we would call to set up refills.  Stressed importance of returning phone calls so that we  could ensure they receive their medications in time each month.  Informed patient that we should be setting up refills 7-10 days prior to when they will run out of medication.  A pharmacist will reach out to perform a clinical assessment periodically.  Informed patient that a welcome packet, containing information about our pharmacy and other support services, a Notice of Privacy Practices, and a drug information handout will be sent.      The patient or caregiver noted above participated in the development of this care plan and knows that they can request review of or adjustments to the care plan at any time.      Patient or caregiver verbalized understanding of the above information as well as how to contact the pharmacy at [phone] option 4 with any questions/concerns.  The pharmacy is open Monday through Friday 8:30am-4:30pm.  A pharmacist is available 24/7 via pager to answer any clinical questions they may have.    Patient Specific Needs     Does the patient have any physical, cognitive, or cultural barriers? No    Does the patient have adequate living arrangements? (i.e. the ability to store and take their medication appropriately) Yes    Did you identify any home environmental safety or security hazards? No    Patient prefers to have medications discussed with  Patient     Is the patient or caregiver able to read and understand education materials at a high school level or above? Yes    Patient's primary language is  English     Is the patient high risk? No    SOCIAL DETERMINANTS OF HEALTH     At the [facility], we have learned that life circumstances - like trouble affording food, housing, utilities, or transportation can affect the health of many of our patients.   That is why we wanted to ask: are you currently experiencing any life circumstances that are negatively impacting your health and/or quality of life? No    Social Determinants of Health     Financial Resource Strain: Low Risk  (08/18/2021)    Overall Financial Resource Strain (CARDIA)     Difficulty of Paying Living Expenses: Not hard at all   Internet Connectivity: Internet connectivity concern identified (08/18/2021)    Internet Connectivity     Do you have access to internet services: No     How do you connect to the internet: Not on file     Is your internet connection strong enough for you to watch video on your device without major problems?: Not on file     Do you have enough data to get through the month?: Not on file     Does at least one of the devices have a camera that you can use for video chat?: Not on file   Food Insecurity: No Food Insecurity (08/18/2021)    Hunger Vital Sign     Worried About Running Out of Food in the Last Year: Never true     Ran Out of Food in the Last Year: Never true   Tobacco Use: Medium Risk (11/19/2022)    Patient History     Smoking Tobacco Use: Former     Smokeless Tobacco Use: Former     Passive Exposure: Never   Housing/Utilities: Low Risk  (08/18/2021)    Housing/Utilities     Within the past 12 months, have you ever stayed: outside, in a car, in a tent, in an overnight shelter, or temporarily  in someone else's home (i.e. couch-surfing)?: No     Are you worried about losing your housing?: No     Within the past 12 months, have you been unable to get utilities (heat, electricity) when it was really needed?: No   Alcohol Use: Not At Risk (08/18/2021)    Alcohol Use     How often do you have a drink containing alcohol?: 2 - 3 times per week     How many drinks containing alcohol do you have on a typical day when you are drinking?: 1 - 2     How often do you have 5 or more drinks on one occasion?: Never   Transportation Needs: No Transportation Needs (08/18/2021)    PRAPARE - Transportation     Lack of Transportation (Medical): No     Lack of Transportation (Non-Medical): No   Substance Use: Low Risk  (08/18/2021)    Substance Use     Taken prescription drugs for non-medical reasons: Never     Taken illegal drugs: Never     Patient indicated they have taken drugs in the past year for non-medical reasons: Yes, [positive answer(s)]: Not on file   Health Literacy: Medium Risk (08/18/2021)    Health Literacy     : Rarely   Physical Activity: Sufficiently Active (08/18/2021)    Exercise Vital Sign     Days of Exercise per Week: 7 days     Minutes of Exercise per Session: 60 min   Interpersonal Safety: Unknown (11/24/2022)    Interpersonal Safety     Unsafe Where You Currently Live: Not on file     Physically Hurt by Anyone: Not on file     Abused by Anyone: Not on file   Stress: No Stress Concern Present (08/18/2021)    [employer] of Occupational Health - Occupational Stress Questionnaire     Feeling of Stress : Not at all   Intimate Partner Violence: Not At Risk (08/18/2021)    Humiliation, Afraid, Rape, and Kick questionnaire     Fear of Current or Ex-Partner: No     Emotionally Abused: No     Physically Abused: No     Sexually Abused: No   Depression: Not at risk (11/19/2022)    PHQ-2     PHQ-2 Score: 0   Social Connections: Moderately Isolated (08/18/2021)    Social Connection and Isolation Panel [NHANES]     Frequency of Communication with Friends and Family: More than three times a week     Frequency of Social Gatherings with Friends and Family: Once a week     Attends Religious Services: Never     Database administrator or Organizations: No     Attends Engineer, structural: Never     Marital Status: Married       Would you be willing to receive help with any of the needs that you have identified today? Not applicable       Thad Ranger, PharmD  [facility] Specialty Pharmacist

## 2022-11-24 NOTE — Unmapped (Signed)
[facility] Specialty Medication Onboarding    Specialty Medication: Tacrolimus 0.5mg  capsule  Prior Authorization: Not Required   Financial Assistance: No - copay  <$25  Final Copay/Day Supply: $24 / 90 days    Insurance Restrictions: None     Notes to Pharmacist: N/A  Credit Card on File: no    The triage team has completed the benefits investigation and has determined that the patient is able to fill this medication at [facility]. Please contact the patient to complete the onboarding or follow up with the prescribing physician as needed.

## 2022-11-25 MED ORDER — PEG 3350-ELECTROLYTES 236 GRAM-22.74 GRAM-6.74 GRAM-5.86 GRAM SOLUTION
0 refills | 0 days | Status: CP
Start: 2022-11-25 — End: ?

## 2022-11-25 NOTE — Unmapped (Signed)
Colonoscopy  Procedure #1     Procedure #2   [id number]  MRN   5  Endoscopist     Is the patient's health insurance [address], [facility]), or [employer]vantage?     Urgent procedure     Are you pregnant?     Are you in the process of scheduling or awaiting results of a heart ultrasound, stress test, or catheterization to evaluate new or worsening chest pain, dizziness, or shortness of breath?     Do you take: Plavix (clopidogrel), Coumadin (warfarin), Lovenox (enoxaparin), Pradaxa (dabigatran), Effient (prasugrel), Xarelto (rivaroxaban), Eliquis (apixaban), Pletal (cilostazol), or Brilinta (ticagrelor)?          Which of the above medications are you taking?          What is the name of the medical practice that manages this medication?          What is the name of the medical provider who manages this medication?     Do you have hemophilia, von Willebrand disease, or low platelets?     Do you have a pacemaker or implanted cardiac defibrillator?     Has a [place] [facility] provider specified the location(s)?     Which location(s) did the [facility] provider specify?        [facility]        [place]        [facility]        [facility]     Is procedure indication for variceal banding (this does NOT include variceal screening)?     Have you had a heart attack, stroke or heart stent placement within the past 6 months?     Month of event     Year of event (ONLY ENTER LAST 2 DIGITS)        5  Height (feet)   7  Height (inches)   164  Weight (pounds)   25.7  BMI          Did the ordering provider specify a bowel prep?          What bowel prep was specified?     Do you have chronic kidney disease?     Do you have chronic constipation or have you had poor quality bowel preps for past colonoscopies?     Do you have Crohn's disease or ulcerative colitis?     Have you had weight loss surgery?          When you walk around your house or grocery store, do you have to stop and rest due to shortness of breath, chest pain, or light-headedness?     Do you ever use supplemental oxygen?     Have you been hospitalized for cirrhosis of the liver or heart failure in the last 12 months?     Have you been treated for mouth or throat cancer with radiation or surgery?     Have you been told that it is difficult for doctors to insert a breathing tube in you during anesthesia?     Have you had a heart or lung transplant?          Are you on dialysis?     Do you have cirrhosis of the liver?     Do you have myasthenia gravis?     Is the patient a prisoner?          Have you been diagnosed with sleep apnea or do you wear a  CPAP machine at night?     Are you younger than 30?     Have you previously received propofol sedation administered by an anesthesiologist for a GI procedure?     Do you drink an average of more than 3 drinks of alcohol per day?   TRUE  Do you regularly take suboxone or any prescription medications for chronic pain?     Do you regularly take Ativan, Klonopin, Xanax, Valium, lorazepam, clonazepam, alprazolam, or diazepam?     Have you previously had difficulty with sedation during a GI procedure?     Have you been diagnosed with PTSD?     Are you allergic to fentanyl or midazolam (Versed)?     Do you take medications for HIV?   ################# ## ###################################################################################################################   MRN:          [id number]   Anticoag Review:  No   Nurse Triage:  No   GI Clinic Consult:  No   Procedure(s):  Colonoscopy     0   [facility]  [facility]     [facility]     [place]        Endoscopist:  5   Urgent:            No   Prep:               [place]                  ################# ## ###################################################################################################################

## 2022-11-26 MED FILL — PANTOPRAZOLE 40 MG TABLET,DELAYED RELEASE: ORAL | 90 days supply | Qty: 90 | Fill #3

## 2022-11-26 MED FILL — VALGANCICLOVIR 450 MG TABLET: ORAL | 30 days supply | Qty: 30 | Fill #4

## 2022-11-26 MED FILL — ATORVASTATIN 40 MG TABLET: ORAL | 90 days supply | Qty: 90 | Fill #3

## 2022-11-26 MED FILL — PREDNISONE 5 MG TABLET: ORAL | 90 days supply | Qty: 135 | Fill #1

## 2022-11-26 MED FILL — CALCIUM 600 MG (AS CALCIUM CARBONATE 1,500 MG) TABLET: ORAL | 90 days supply | Qty: 180 | Fill #3

## 2022-12-07 LAB — HLA DS POST TRANSPLANT
ANTI-DONOR DRW #1 MFI: 17 MFI
ANTI-DONOR DRW #2 MFI: 17 MFI
ANTI-DONOR HLA-A #1 MFI: 11 MFI
ANTI-DONOR HLA-A #2 MFI: 0 MFI
ANTI-DONOR HLA-B #1 MFI: 0 MFI
ANTI-DONOR HLA-B #2 MFI: 0 MFI
ANTI-DONOR HLA-C #2 MFI: 0 MFI
ANTI-DONOR HLA-DQB #1 MFI: 0 MFI
ANTI-DONOR HLA-DQB #2 MFI: 0 MFI
ANTI-DONOR HLA-DR #1 MFI: 15 MFI
ANTI-DONOR HLA-DR #2 MFI: 2 MFI

## 2022-12-07 LAB — FSAB CLASS 1 ANTIBODY SPECIFICITY: HLA CLASS 1 ANTIBODY RESULT: NEGATIVE

## 2022-12-07 LAB — FSAB CLASS 2 ANTIBODY SPECIFICITY: HLA CL2 AB RESULT: NEGATIVE

## 2022-12-10 ENCOUNTER — Ambulatory Visit: Admit: 2022-12-10 | Discharge: 2022-12-11 | Payer: MEDICARE

## 2022-12-10 DIAGNOSIS — Z942 Lung transplant status: Principal | ICD-10-CM

## 2022-12-10 LAB — COMPREHENSIVE METABOLIC PANEL
ALBUMIN: 3.7 g/dL (ref 3.4–5.0)
ALKALINE PHOSPHATASE: 74 U/L (ref 46–116)
ALT (SGPT): 25 U/L (ref 10–49)
ANION GAP: 4 mmol/L — ABNORMAL LOW (ref 5–14)
AST (SGOT): 32 U/L (ref <=34)
BILIRUBIN TOTAL: 0.4 mg/dL (ref 0.3–1.2)
BLOOD UREA NITROGEN: 28 mg/dL — ABNORMAL HIGH (ref 9–23)
BUN / CREAT RATIO: 24
CALCIUM: 9.5 mg/dL (ref 8.7–10.4)
CHLORIDE: 107 mmol/L (ref 98–107)
CO2: 32.2 mmol/L — ABNORMAL HIGH (ref 20.0–31.0)
CREATININE: 1.19 mg/dL — ABNORMAL HIGH
EGFR CKD-EPI (2021) MALE: 65 mL/min/{1.73_m2} (ref >=60)
GLUCOSE RANDOM: 92 mg/dL (ref 70–179)
POTASSIUM: 4 mmol/L (ref 3.4–4.8)
PROTEIN TOTAL: 6.8 g/dL (ref 5.7–8.2)
SODIUM: 143 mmol/L (ref 135–145)

## 2022-12-10 LAB — CBC W/ AUTO DIFF
BASOPHILS ABSOLUTE COUNT: 0 10*9/L (ref 0.0–0.1)
BASOPHILS RELATIVE PERCENT: 0.6 %
EOSINOPHILS ABSOLUTE COUNT: 0.1 10*9/L (ref 0.0–0.5)
EOSINOPHILS RELATIVE PERCENT: 2 %
HEMATOCRIT: 33.8 % — ABNORMAL LOW (ref 39.0–48.0)
HEMOGLOBIN: 11.1 g/dL — ABNORMAL LOW (ref 12.9–16.5)
LYMPHOCYTES ABSOLUTE COUNT: 0.6 10*9/L — ABNORMAL LOW (ref 1.1–3.6)
LYMPHOCYTES RELATIVE PERCENT: 14.1 %
MEAN CORPUSCULAR HEMOGLOBIN CONC: 32.9 g/dL (ref 32.0–36.0)
MEAN CORPUSCULAR HEMOGLOBIN: 29.4 pg (ref 25.9–32.4)
MEAN CORPUSCULAR VOLUME: 89.3 fL (ref 77.6–95.7)
MEAN PLATELET VOLUME: 6.9 fL (ref 6.8–10.7)
MONOCYTES ABSOLUTE COUNT: 0.3 10*9/L (ref 0.3–0.8)
MONOCYTES RELATIVE PERCENT: 8.2 %
NEUTROPHILS ABSOLUTE COUNT: 3 10*9/L (ref 1.8–7.8)
NEUTROPHILS RELATIVE PERCENT: 75.1 %
NUCLEATED RED BLOOD CELLS: 0 /100{WBCs} (ref <=4)
PLATELET COUNT: 179 10*9/L (ref 150–450)
RED BLOOD CELL COUNT: 3.78 10*12/L — ABNORMAL LOW (ref 4.26–5.60)
RED CELL DISTRIBUTION WIDTH: 16 % — ABNORMAL HIGH (ref 12.2–15.2)
WBC ADJUSTED: 4 10*9/L (ref 3.6–11.2)

## 2022-12-10 LAB — CMV DNA, QUANTITATIVE, PCR: CMV VIRAL LD: NOT DETECTED

## 2022-12-10 LAB — PHOSPHORUS: PHOSPHORUS: 3.2 mg/dL (ref 2.4–5.1)

## 2022-12-10 LAB — IGG: GAMMAGLOBULIN; IGG: 752 mg/dL (ref 650–1600)

## 2022-12-10 LAB — EBV QUANTITATIVE PCR, BLOOD: EBV VIRAL LOAD RESULT: NOT DETECTED

## 2022-12-10 LAB — MAGNESIUM: MAGNESIUM: 2.1 mg/dL (ref 1.6–2.6)

## 2022-12-10 LAB — TACROLIMUS LEVEL, TROUGH: TACROLIMUS, TROUGH: 2 ng/mL — ABNORMAL LOW (ref 5.0–15.0)

## 2022-12-10 MED ORDER — TACROLIMUS 0.5 MG CAPSULE, IMMEDIATE-RELEASE
ORAL_CAPSULE | ORAL | 3 refills | 90 days | Status: CP
Start: 2022-12-10 — End: 2023-12-10

## 2022-12-10 NOTE — Unmapped (Signed)
Called [name] to discuss lab results. Tacrolimus level was decreased at 2.0 with a goal of 5-7. Medication regimen is currently 0.5 mg BID. Discussed with LungTXPProviders: Cornelius Moras, NP, dosing at this time increased to 0.5/1. Repeat labs scheduled one week. Resulted labs reviewed. All questions answered. Pt verbalized understanding.

## 2022-12-11 LAB — VITAMIN D 25 HYDROXY: VITAMIN D, TOTAL (25OH): 81.3 ng/mL — ABNORMAL HIGH (ref 20.0–80.0)

## 2022-12-13 NOTE — Unmapped (Addendum)
[facility] Pharmacist has reviewed a new prescription for tacrolimus that indicates a dose increase.  Patient was counseled on this dosage change by CL- see epic note from 12/10/22.  Next refill call date adjusted if necessary.        Clinical Assessment Needed For: Dose Change  Medication: Tacrolimus 0.5mg  capsule  Last Fill Date/Day Supply: 11/26/2022 / 90 days  Refill Too Soon until 01/15/2023  Was previous dose already scheduled to fill: No    Notes to Pharmacist: Will re-test on 09/09

## 2022-12-15 DIAGNOSIS — Z2989 Need for pneumocystis prophylaxis: Principal | ICD-10-CM

## 2022-12-15 MED ORDER — SULFAMETHOXAZOLE 400 MG-TRIMETHOPRIM 80 MG TABLET
ORAL_TABLET | ORAL | 3 refills | 84 days | Status: CP
Start: 2022-12-15 — End: 2023-12-15
  Filled 2022-12-20: qty 36, 84d supply, fill #0

## 2022-12-15 NOTE — Unmapped (Signed)
[facility] Refill Coordination Note    Specialty Medication(s) to be Shipped:   Transplant: valgancyclovir 450mg     Other medication(s) to be shipped:  Magnesium Oxide 400 mg, Sulfamethoxazole-Trimethoprim 400-80     Spencer Pena Sr., DOB: Feb 08, 1951  Phone: [phone] (home) [phone] (work)      All above HIPAA information was verified with patient.     Was a Nurse, learning disability used for this call? No    Completed refill call assessment today to schedule patient's medication shipment from the [facility] [phone]).  All relevant notes have been reviewed.     Specialty medication(s) and dose(s) confirmed: Regimen is correct and unchanged.   Changes to medications: Chosen reports no changes at this time.  Changes to insurance: No  New side effects reported not previously addressed with a pharmacist or physician: None reported  Questions for the pharmacist: No    Confirmed patient received a Conservation officer, historic buildings and a Surveyor, mining with first shipment. The patient will receive a drug information handout for each medication shipped and additional FDA Medication Guides as required.       DISEASE/MEDICATION-SPECIFIC INFORMATION        N/A    SPECIALTY MEDICATION ADHERENCE     Medication Adherence    Patient reported X missed doses in the last month: 0  Specialty Medication: tacrolimus 0.5 MG capsule  Patient is on additional specialty medications: Yes  Additional Specialty Medications: valGANciclovir 450 mg tablet   Patient Reported Additional Medication X Missed Doses in the Last Month: 0  Patient is on more than two specialty medications: No              Were doses missed due to medication being on hold? No    Valganciclovir 450 mg: 7 days of medicine on hand        REFERRAL TO PHARMACIST     Referral to the pharmacist: Not needed      [facility]     Shipping address confirmed in Epic.       Delivery Scheduled: Yes, Expected medication delivery date: 12/20/22.     Medication will be delivered via Same Day Courier to the prescription address in Epic [place].    Spencer Pena   [facility] Specialty Technician

## 2022-12-17 ENCOUNTER — Ambulatory Visit: Admit: 2022-12-17 | Discharge: 2022-12-18 | Payer: MEDICARE

## 2022-12-17 DIAGNOSIS — Z942 Lung transplant status: Principal | ICD-10-CM

## 2022-12-17 LAB — CBC W/ AUTO DIFF
BASOPHILS ABSOLUTE COUNT: 0 10*9/L (ref 0.0–0.1)
BASOPHILS RELATIVE PERCENT: 0.9 %
EOSINOPHILS ABSOLUTE COUNT: 0.1 10*9/L (ref 0.0–0.5)
EOSINOPHILS RELATIVE PERCENT: 1.8 %
HEMATOCRIT: 33.1 % — ABNORMAL LOW (ref 39.0–48.0)
HEMOGLOBIN: 10.9 g/dL — ABNORMAL LOW (ref 12.9–16.5)
LYMPHOCYTES ABSOLUTE COUNT: 0.6 10*9/L — ABNORMAL LOW (ref 1.1–3.6)
LYMPHOCYTES RELATIVE PERCENT: 12.3 %
MEAN CORPUSCULAR HEMOGLOBIN CONC: 32.8 g/dL (ref 32.0–36.0)
MEAN CORPUSCULAR HEMOGLOBIN: 28.9 pg (ref 25.9–32.4)
MEAN CORPUSCULAR VOLUME: 88.2 fL (ref 77.6–95.7)
MEAN PLATELET VOLUME: 7.5 fL (ref 6.8–10.7)
MONOCYTES ABSOLUTE COUNT: 0.4 10*9/L (ref 0.3–0.8)
MONOCYTES RELATIVE PERCENT: 8.4 %
NEUTROPHILS ABSOLUTE COUNT: 3.5 10*9/L (ref 1.8–7.8)
NEUTROPHILS RELATIVE PERCENT: 76.6 %
NUCLEATED RED BLOOD CELLS: 0 /100{WBCs} (ref <=4)
PLATELET COUNT: 192 10*9/L (ref 150–450)
RED BLOOD CELL COUNT: 3.76 10*12/L — ABNORMAL LOW (ref 4.26–5.60)
RED CELL DISTRIBUTION WIDTH: 16.5 % — ABNORMAL HIGH (ref 12.2–15.2)
WBC ADJUSTED: 4.6 10*9/L (ref 3.6–11.2)

## 2022-12-17 LAB — COMPREHENSIVE METABOLIC PANEL
ALBUMIN: 3.5 g/dL (ref 3.4–5.0)
ALKALINE PHOSPHATASE: 68 U/L (ref 46–116)
ALT (SGPT): 22 U/L (ref 10–49)
ANION GAP: 3 mmol/L — ABNORMAL LOW (ref 5–14)
AST (SGOT): 21 U/L (ref <=34)
BILIRUBIN TOTAL: 0.3 mg/dL (ref 0.3–1.2)
BLOOD UREA NITROGEN: 24 mg/dL — ABNORMAL HIGH (ref 9–23)
BUN / CREAT RATIO: 18
CALCIUM: 9.6 mg/dL (ref 8.7–10.4)
CHLORIDE: 107 mmol/L (ref 98–107)
CO2: 32.4 mmol/L — ABNORMAL HIGH (ref 20.0–31.0)
CREATININE: 1.36 mg/dL — ABNORMAL HIGH
EGFR CKD-EPI (2021) MALE: 55 mL/min/{1.73_m2} — ABNORMAL LOW (ref >=60)
GLUCOSE RANDOM: 102 mg/dL (ref 70–179)
POTASSIUM: 4.5 mmol/L (ref 3.4–4.8)
PROTEIN TOTAL: 6.3 g/dL (ref 5.7–8.2)
SODIUM: 142 mmol/L (ref 135–145)

## 2022-12-17 LAB — CMV DNA, QUANTITATIVE, PCR: CMV VIRAL LD: NOT DETECTED

## 2022-12-17 LAB — MAGNESIUM: MAGNESIUM: 2 mg/dL (ref 1.6–2.6)

## 2022-12-17 LAB — TACROLIMUS LEVEL, TROUGH: TACROLIMUS, TROUGH: 3.6 ng/mL — ABNORMAL LOW (ref 5.0–15.0)

## 2022-12-17 LAB — PHOSPHORUS: PHOSPHORUS: 3.5 mg/dL (ref 2.4–5.1)

## 2022-12-17 LAB — IGG: GAMMAGLOBULIN; IGG: 728 mg/dL (ref 650–1600)

## 2022-12-17 NOTE — Unmapped (Signed)
Called [name] to discuss lab results. Tacrolimus level was decreased at 3.6 with a goal of 5-7. Medication regimen is currently 0.5/1. Discussed with LungTXPProviders: [name], CPP, dosing at this time to remain the same. Repeat labs scheduled one week. Resulted labs reviewed. All questions answered. Pt verbalized understanding.

## 2022-12-18 LAB — EBV QUANTITATIVE PCR, BLOOD: EBV VIRAL LOAD RESULT: NOT DETECTED

## 2022-12-20 MED FILL — VALGANCICLOVIR 450 MG TABLET: ORAL | 30 days supply | Qty: 30 | Fill #5

## 2022-12-20 MED FILL — MAGNESIUM OXIDE 400 MG (241.3 MG MAGNESIUM) TABLET: ORAL | 30 days supply | Qty: 180 | Fill #2

## 2022-12-22 ENCOUNTER — Encounter
Admit: 2022-12-22 | Discharge: 2022-12-22 | Payer: MEDICARE | Attending: Student in an Organized Health Care Education/Training Program | Primary: Student in an Organized Health Care Education/Training Program

## 2022-12-22 ENCOUNTER — Ambulatory Visit: Admit: 2022-12-22 | Discharge: 2022-12-22 | Payer: MEDICARE

## 2022-12-22 MED ADMIN — sodium chloride (NS) 0.9 % infusion: 10 mL/h | INTRAVENOUS | @ 13:00:00 | Stop: 2022-12-22

## 2022-12-22 MED ADMIN — lactated Ringers infusion: INTRAVENOUS | @ 13:00:00 | Stop: 2022-12-22

## 2022-12-22 MED ADMIN — Propofol (DIPRIVAN) injection: INTRAVENOUS | @ 13:00:00 | Stop: 2022-12-22

## 2022-12-22 MED ADMIN — lidocaine (PF) (XYLOCAINE-MPF) 20 mg/mL (2 %) injection: INTRAVENOUS | @ 13:00:00 | Stop: 2022-12-22

## 2022-12-24 ENCOUNTER — Ambulatory Visit: Admit: 2022-12-24 | Discharge: 2022-12-25 | Payer: MEDICARE

## 2022-12-24 DIAGNOSIS — Z942 Lung transplant status: Principal | ICD-10-CM

## 2022-12-24 LAB — COMPREHENSIVE METABOLIC PANEL
ALBUMIN: 3.9 g/dL (ref 3.4–5.0)
ALKALINE PHOSPHATASE: 77 U/L (ref 46–116)
ALT (SGPT): 25 U/L (ref 10–49)
ANION GAP: 4 mmol/L — ABNORMAL LOW (ref 5–14)
AST (SGOT): 30 U/L (ref <=34)
BILIRUBIN TOTAL: 0.4 mg/dL (ref 0.3–1.2)
BLOOD UREA NITROGEN: 24 mg/dL — ABNORMAL HIGH (ref 9–23)
BUN / CREAT RATIO: 16
CALCIUM: 10 mg/dL (ref 8.7–10.4)
CHLORIDE: 106 mmol/L (ref 98–107)
CO2: 32 mmol/L — ABNORMAL HIGH (ref 20.0–31.0)
CREATININE: 1.46 mg/dL — ABNORMAL HIGH
EGFR CKD-EPI (2021) MALE: 51 mL/min/{1.73_m2} — ABNORMAL LOW (ref >=60)
GLUCOSE RANDOM: 101 mg/dL — ABNORMAL HIGH (ref 70–99)
POTASSIUM: 4.2 mmol/L (ref 3.4–4.8)
PROTEIN TOTAL: 6.9 g/dL (ref 5.7–8.2)
SODIUM: 142 mmol/L (ref 135–145)

## 2022-12-24 LAB — TACROLIMUS LEVEL: TACROLIMUS BLOOD: 3.7 ng/mL

## 2022-12-24 MED ORDER — TACROLIMUS 0.5 MG CAPSULE, IMMEDIATE-RELEASE
ORAL_CAPSULE | Freq: Two times a day (BID) | ORAL | 3 refills | 90 days | Status: CP
Start: 2022-12-24 — End: 2023-12-24

## 2022-12-24 NOTE — Unmapped (Signed)
Called Spencer Pena Sr. to discuss lab results. Tacrolimus level was decreased at 3.7 with a goal of 5-7. Medication regimen is currently 0.5/1. Discussed with LungTXPProviders: [name], CPP, dosing at this time increased to 1 mg BID. Repeat labs scheduled two weeks. Resulted labs reviewed. All questions answered. Pt verbalized understanding.

## 2022-12-27 NOTE — Unmapped (Addendum)
[facility] Pharmacist has reviewed a new prescription for tacrolimus that indicates a dose increase.  Patient was counseled on this dosage change by corodinator CL- see epic note from 8/16.  Next refill call date adjusted if necessary.        Clinical Assessment Needed For: Dose Change  Medication: Tacrolimus 0.5mg  capsule  Last Fill Date/Day Supply: 11/26/2022 / 90 days  Refill Too Soon until 01/03/2023  Was previous dose already scheduled to fill: No    Notes to Pharmacist: Will re-test on 08/26

## 2022-12-31 ENCOUNTER — Ambulatory Visit: Admit: 2022-12-31 | Discharge: 2023-01-01 | Payer: MEDICARE

## 2022-12-31 NOTE — Unmapped (Signed)
Behavioral change recommendations:  1.  Timed voiding every 2-3 hours by the clock and not on sensation alone  2.  Double voiding-trying to urinate again shortly after going to see if you can empty more  3.  Continue good bowel regimen to minimize constipation episodes  4.  Shift fluid intake earlier in the day and less in the evening hours and at night to decrease nighttime voiding  5.  Minimize high diuretic-type fluids, such as tea, soda, alcohol, coffee, acidic foods, like citrus fruits and tomatoes, and artifical sweeteners. These will increase urinary frequency

## 2022-12-31 NOTE — Unmapped (Signed)
UROLOGY CLINIC NOTE     Patient Name: Spencer BONNETTE Sr.  Patient Age: 71 y.o.  Encounter Date: 12/31/2022    Referring Provider:   Jamesetta Geralds, MD  [address]  [address]  [place],  [place] [address]    PCP: Jamesetta Geralds, MD    Reason for Visit:  Chief Complaint   Patient presents with    LUTS       Assessment  72 y.o. male with a history of CKD Stage 3, status post lung transplant,  sleep apnea, and Type 2 diabetes on metformin  Mixed lower urinary tract symptoms (LUTS) suggestive of BPH, previously on terazosin  Urinary frequency    We reviewed causes of LUTS: abnormalities of the prostate/bladder/urethra, infections, neurologic disease, obstruction and detrusor over/under activity. We discussed the role of medications, bladder irritants, fluid intake, constipation, edema, and sleep apnea. Patient attributes urinary frequency/LUTS to medications and fluid intake use. IPSS score of 15 with a bother of 2. PVR in clinic on terazosin was 33ml. Medication stopped by transplant team 1.5 months ago. PVR today 0ml off terazosin. Patient very reassured. Will continue to forego medication.      Plan:  - Recommended timed voiding, double voiding, shifting fluid intake to earlier, and minimizing high diuretic-type fluids (tea, coffee, alcohol).  - Continue to manage diabetes with medications.  - Avoid constipation. Increase fiber and take Miralax as needed   - Strongly recommend CPAP or mini-PAP for his sleep apnea.    Follow-up in 12 months, or sooner if any problems arise.      HPI:   Spencer Peskin Sr. is a 72 y.o. male with a history of CKD Stage 3, status post lung transplant, sleep apnea, and Type 2 diabetes on metformin.    Seen by Dr. Aundria Rud in 2022 for a circumcision.    LUTS suggestive of BPH  Experiencing symptoms since his lung transplant in 2022. Suspects is related to medications.  Most notable symptoms include incomplete emptying, frequency. Notes symptoms worse with constipation.  Most bothered by nocturia 2-3x a night. Says symptoms vary with fluid intake (tea, coffee, beer)  Previously on terazosin; stopped by transplant team because felt wasn't needed anymore. PVR 0ml today (previously 33ml)    IPSS  Incomplete Emptying: 3  Frequency: 4  Intermittency: 3  Urgency: 2  Weak stream: 2  Straining: 0  Nocturia: 2, depends on fluid intake  Total: 15  (0-7 mild, 8-19 moderate, 20-35 severe)    QOL: 2  Leakage: 1, very mild    He denies retention, gross hematuria, dysuria, incontinence, or UTIs.  Constipation: Yes, on occasion  Alcohol: 4 beers a week  Intake: Water, tea, soda. Notes he is very hydrated throughout the day.  Diabetes: Yes, on metformin  Has OSA, not wearing CPAP due to discomfort. Is considering a mini-PAP. Still needs to look into this.    Today he is doing well and denies fevers, chills, chest pain, shortness of breath, cough, wheezing, nausea, vomiting, abdominal pain, or flank pain.      Past Medical History:  Past Medical History:   Diagnosis Date    Arthritis     BPH (benign prostatic hyperplasia)     Cancer (CMS-HCC)     Colon 2006;  Liver 2006    Cataract     Colon cancer (CMS-HCC) 2006    Diabetes mellitus (CMS-HCC) 2001    Type II    Diverticulosis  Dry eyes     Emphysema of lung (CMS-HCC)     History of chemotherapy 2006-2007    History of iron deficiency anemia     History of transfusion     Internal hemorrhoids     Lung replaced by transplant (CMS-HCC) 07/31/2020    Medical history reviewed with no changes 05/22/2018    per pt       Past Surgical History:  Past Surgical History:   Procedure Laterality Date    CARDIAC SURGERY      cardiac cath    CATARACT EXTRACTION Left 2011    CATARACT EXTRACTION Right 05/25/2018    PC/IOL    CHG [place], CHEST,REAL TIME Bilateral 06/20/2020    Procedure: ULTRASOUND, CHEST, REAL TIME WITH IMAGE DOCUMENTATION;  Surgeon: Anderson Malta, MD;  Location: [facility]ON[facility] PROCEDURE LAB [facility];  Service: Pulmonary    COLECTOMY COLON SURGERY  2006    EYE SURGERY Bilateral 2011; 2018    cataract surgery     LIVER SURGERY  2007    LUNG TRANSPLANT, DOUBLE  05/15/2020    PR BRONCHOSCOPY,DIAGNOSTIC N/A 05/15/2020    Procedure: BRONCHOSCOPY, RIGID OR FLEXIBLE, W/WO FLUOROSCOPIC GUIDANCE; DIAGNOSTIC, WITH CELL WASHING, WHEN PERFORMED;  Surgeon: Evert Kohl, MD;  Location: MAIN OR [place];  Service: Thoracic    PR CATH PLACE/CORON ANGIO, IMG SUPER/INTERP,R&L HRT CATH, L HRT VENTRIC N/A 03/04/2020    Procedure: Left/Right Heart Catheterization;  Surgeon: Lesle Reek, MD;  Location: [facility] CATH;  Service: Cardiology    PR COLONOSCOPY FLX DX W/COLLJ SPEC WHEN PFRMD N/A 12/28/2016    Procedure: COLONOSCOPY, FLEXIBLE, PROXIMAL TO SPLENIC FLEXURE; DIAGNOSTIC, W/WO COLLECTION SPECIMEN BY BRUSH OR WASH;  Surgeon: Alfred Levins, MD;  Location: [facility] GI PROCEDURES [facility];  Service: Gastroenterology    PR COLSC FLX W/RMVL OF TUMOR POLYP LESION SNARE TQ N/A 12/11/2021    Procedure: COLONOSCOPY FLEX; W/REMOV TUMOR/LES BY SNARE;  Surgeon: Janace Aris, MD;  Location: [facility];  Service: Gastroenterology    PR COLSC FLX W/RMVL OF TUMOR POLYP LESION SNARE TQ N/A 12/22/2022    Procedure: COLONOSCOPY FLEX; W/REMOV TUMOR/LES BY SNARE;  Surgeon: Cleophus Molt, MD;  Location: [facility] PROCEDURES [facility];  Service: Gastroenterology    PR ESOPHAGEAL MOTILITY STUDY, MANOMETRY N/A 07/23/2020    Procedure: ESOPHAGEAL MOTILITY STUDY W/INT & REP;  Surgeon: Nurse-Based Giproc;  Location: [facility] PROCEDURES [facility];  Service: Gastroenterology    PR GERD TST W/ NASAL PH ELECTROD N/A 07/23/2020    Procedure: ESOPHAGEAL 24 HOUR PH MONITORING;  Surgeon: Nurse-Based Giproc;  Location: [facility] PROCEDURES [name];  Service: Gastroenterology    PR LUNG TRANSPLANT,DBL W CP BYPASS Bilateral 05/15/2020    Procedure: LUNG TRANSPL DBL; Ella Jubilee BYPASS;  Surgeon: Evert Kohl, MD;  Location: MAIN OR [facility];  Service: Thoracic    PR UPPER GI ENDOSCOPY,BIOPSY N/A 06/26/2020    Procedure: UGI ENDOSCOPY; WITH BIOPSY, SINGLE OR MULTIPLE;  Surgeon: Chriss Driver, MD;  Location: [facility] PROCEDURES [facility];  Service: Gastroenterology    PR XCAPSL CTRC RMVL INSJ IO LENS PROSTH W/O ECP Right 05/25/2018    Procedure: EXTRACAPSULAR CATARACT REMOVAL W/INSERTION OF INTRAOCULAR LENS PROSTHESIS, MANUAL OR MECHANICAL TECHNIQUE;  Surgeon: Arville Care, MD;  Location: [facility] OR [facility];  Service: Ophthalmology        Medications:  Current Outpatient Medications   Medication Sig Dispense Refill    fluticasone propionate (CUTIVATE) 0.05 % cream Apply 1 Application topically daily as  needed.      acetaminophen (TYLENOL EXTRA STRENGTH) 500 MG tablet Take 1-2 tablets (500-1,000 mg total) by mouth every six (6) hours as needed for pain. 180 tablet PRN    alendronate (FOSAMAX) 70 MG tablet Take 1 tablet (70 mg total) by mouth every seven (7) days. 12 tablet 3    aspirin (ECOTRIN) 81 MG tablet Take 1 tablet (81 mg total) by mouth daily. 90 tablet 3    atorvastatin (LIPITOR) 40 MG tablet Take 1 tablet (40 mg total) by mouth daily. 90 tablet 3    azathioprine (IMURAN) 50 mg tablet Take 1 tablet (50 mg total) by mouth daily. 90 tablet 3    calcium carbonate (CALCIUM 600) 1,500 mg (600 mg elem calcium) tablet Take 1 tablet (600 mg of elem calcium total) by mouth Two (2) times a day. 60 tablet 11    cholecalciferol, vitamin D3 25 mcg, 1,000 units,, 1,000 unit (25 mcg) tablet Take 1 tablet (25 mcg total) by mouth daily. 90 tablet 3    fluticasone propion-salmeterol (ADVAIR HFA) 115-21 mcg/actuation inhaler Inhale 2 puffs Two (2) times a day. 12 g 11    fluticasone propionate (FLONASE) 50 mcg/actuation nasal spray Use 2 sprays into each nostril daily. 16 g 11    magnesium oxide (MAG-OX) 400 mg (241.3 mg elemental magnesium) tablet Take 3 tablets (1,200 mg total) by mouth Two (2) times a day. 180 tablet 11    metFORMIN (GLUCOPHAGE) 1000 MG tablet Take 1 tablet (1,000 mg total) by mouth Two (2) times a day. 180 tablet 3    montelukast (SINGULAIR) 10 mg tablet Take 1 tablet (10 mg total) by mouth daily. 90 tablet 3    pantoprazole (PROTONIX) 40 MG tablet Take 1 tablet (40 mg total) by mouth daily. 90 tablet 3    predniSONE (DELTASONE) 5 MG tablet Take 1.5 tablets (7.5 mg total) by mouth daily. 135 tablet 3    sildenafiL, pulm.hypertension, (REVATIO) 20 mg tablet Take 2-3 pills as needed (up to 60 mg) as needed for erectile dysfunction 60 tablet 3    SITagliptin phosphate (JANUVIA) 100 MG tablet Take 1 tablet (100 mg total) by mouth daily. 90 tablet 3    tacrolimus (PROGRAF) 0.5 MG capsule Take 2 capsules (1 mg total) by mouth two (2) times a day. 360 capsule 3    terazosin (HYTRIN) 1 MG capsule HOLD      valGANciclovir (VALCYTE) 450 mg tablet Take 1 tablet (450 mg total) by mouth daily. 30 tablet 11     No current facility-administered medications for this visit.       Allergies:  Cetuximab, Enalapril, Venom-honey bee, Nsaids (non-steroidal anti-inflammatory drug), and Pollen extracts     Social History:  Patient  reports that he quit smoking about 33 years ago. His smoking use included cigarettes. He started smoking about 57 years ago. He has a 36.4 pack-year smoking history. He has never been exposed to tobacco smoke. He quit smokeless tobacco use about 33 years ago.  His smokeless tobacco use included chew. He reports that he does not currently use alcohol. He reports that he does not use drugs.     Family History:  The patient's family history includes Cancer (age of onset: 23) in his father; Diabetes in his mother; No Known Problems in his brother, maternal aunt, maternal grandfather, maternal grandmother, maternal uncle, paternal aunt, paternal grandfather, paternal grandmother, paternal uncle, and sister.     ROS:   As per HPI.  The patient  was asked to review all abnormal responses not pertinent to today's visit with their primary care provider. Vitals  BP 172/74  - Pulse 81  - Temp 36.5 ??C (97.7 ??F) (Temporal)  - Resp 20  - Ht 171.5 cm (5' 7.5)  - Wt 75.8 kg (167 lb)  - SpO2 98%  - BMI 25.77 kg/m??     Physical Exam:  GENERAL: The patient is a pleasant male in no acute distress.   HEENT: Normocephalic and atraumatic.   NECK: Supple with trachea midline.   LYMPHATICS: No cervical or supraclavicular lymphadenopathy.   PULMONARY: Relaxed respiratory effort on room air.   CARDIOVASCULAR: Regular rate   GASTROINTESTINAL: Soft, nontender and nondistended  GENITOURINARY: Discussed limitations and utlity of DRE. DRE declined today  SKIN: No signs of cyanosis or clubbing.   NEUROLOGICAL: Grossly intact.   PSYCH: Alert and oriented x 3.    Labs Reviewed:  Lab Results   Component Value Date    WBC 4.6 12/17/2022    HGB 10.9 (L) 12/17/2022    HCT 33.1 (L) 12/17/2022    PLT 192 12/17/2022       Lab Results   Component Value Date    NA 142 12/24/2022    K 4.2 12/24/2022    CL 106 12/24/2022    CO2 32.0 (H) 12/24/2022    BUN 24 (H) 12/24/2022    CREATININE 1.46 (H) 12/24/2022    CALCIUM 10.0 12/24/2022    MG 2.0 12/17/2022    PHOS 3.5 12/17/2022       Lab Results   Component Value Date/Time    PSA 1.21 05/28/2022 07:29 AM    PSA 1.27 05/15/2021 07:19 AM    PSA 0.99 02/12/2020 03:42 PM    PSA 0.87 07/23/2019 01:39 PM    PSA 0.83 01/19/2019 11:54 AM    PSA 0.63 12/30/2017 10:48 AM    PSA 0.58 11/01/2016 04:41 PM    PSA 0.50 12/19/2015 02:49 PM     02/19/22 A1C: 6.2    07/02/22 AUA symptom score total of 16 with bother of 3  07/02/22 postvoid residual 33 ml    12/31/22 AUA symptom score total of 16 with bother of 3  12/31/22 postvoid residual 33 ml

## 2023-01-04 NOTE — Unmapped (Signed)
Therapy Update Follow Up: No issues - Copay = $24 for 90ds

## 2023-01-07 ENCOUNTER — Ambulatory Visit: Admit: 2023-01-07 | Discharge: 2023-01-08 | Payer: MEDICARE

## 2023-01-07 DIAGNOSIS — Z942 Lung transplant status: Principal | ICD-10-CM

## 2023-01-07 DIAGNOSIS — Z79899 Other long term (current) drug therapy: Principal | ICD-10-CM

## 2023-01-07 LAB — COMPREHENSIVE METABOLIC PANEL
ALBUMIN: 3.7 g/dL (ref 3.4–5.0)
ALKALINE PHOSPHATASE: 69 U/L (ref 46–116)
ALT (SGPT): 18 U/L (ref 10–49)
ANION GAP: 3 mmol/L — ABNORMAL LOW (ref 5–14)
AST (SGOT): 26 U/L (ref <=34)
BILIRUBIN TOTAL: 0.4 mg/dL (ref 0.3–1.2)
BLOOD UREA NITROGEN: 28 mg/dL — ABNORMAL HIGH (ref 9–23)
BUN / CREAT RATIO: 22
CALCIUM: 9.5 mg/dL (ref 8.7–10.4)
CHLORIDE: 107 mmol/L (ref 98–107)
CO2: 31.5 mmol/L — ABNORMAL HIGH (ref 20.0–31.0)
CREATININE: 1.27 mg/dL — ABNORMAL HIGH
EGFR CKD-EPI (2021) MALE: 60 mL/min/{1.73_m2} (ref >=60)
GLUCOSE RANDOM: 107 mg/dL (ref 70–179)
POTASSIUM: 4.2 mmol/L (ref 3.4–4.8)
PROTEIN TOTAL: 6.2 g/dL (ref 5.7–8.2)
SODIUM: 141 mmol/L (ref 135–145)

## 2023-01-07 LAB — TACROLIMUS LEVEL: TACROLIMUS BLOOD: 3.4 ng/mL

## 2023-01-07 MED ORDER — TACROLIMUS 0.5 MG CAPSULE, IMMEDIATE-RELEASE
ORAL_CAPSULE | ORAL | 3 refills | 90 days | Status: CP
Start: 2023-01-07 — End: 2024-01-07
  Filled 2023-01-13: qty 540, 90d supply, fill #0

## 2023-01-07 NOTE — Unmapped (Signed)
Called Spencer Mallet Sr. to discuss lab results. Tacrolimus level was decreased at 3.4 with a goal of 5-7. Medication regimen is currently 1 mg BID. Discussed with LungTXPProviders: [name], CPP, dosing at this time increased to 2/1. Repeat labs scheduled two weeks. Resulted labs reviewed. All questions answered. Pt verbalized understanding.

## 2023-01-11 NOTE — Unmapped (Addendum)
[facility] Pharmacist has reviewed a new prescription for tacrolimus that indicates a dose increase.  Patient was counseled on this dosage change by coordinator CL- see epic note from 8/30.  Next refill call date adjusted if necessary.        Clinical Assessment Needed For: Dose Change  Medication: Tacrolimus 0.5mg  capsule  Last Fill Date/Day Supply: 11/26/2022 / 90 days  Copay $24  Was previous dose already scheduled to fill: No    Notes to Pharmacist: N/A

## 2023-01-12 NOTE — Unmapped (Signed)
[facility] Refill Coordination Note    Specialty Medication(s) to be Shipped:   Transplant: tacrolimus 0.5mg  and valgancyclovir 450mg     Other medication(s) to be shipped:  januvia 100mg      Spencer Pena Sr., DOB: [birth year]  Phone: [phone] (home) [phone] (work)      All above HIPAA information was verified with patient.     Was a Nurse, learning disability used for this call? No    Completed refill call assessment today to schedule patient's medication shipment from the [facility] [phone]).  All relevant notes have been reviewed.     Specialty medication(s) and dose(s) confirmed: Patient reports changes to the regimen as follows: Tacrolimus 0.5mg - dose increase to 4 caps by mouth daily and 2 caps nightly    Changes to medications: Spencer Pena reports no changes at this time.  Changes to insurance: No  New side effects reported not previously addressed with a pharmacist or physician: None reported  Questions for the pharmacist: No    Confirmed patient received a Conservation officer, historic buildings and a Surveyor, mining with first shipment. The patient will receive a drug information handout for each medication shipped and additional FDA Medication Guides as required.       DISEASE/MEDICATION-SPECIFIC INFORMATION        N/A    SPECIALTY MEDICATION ADHERENCE     Medication Adherence    Patient reported X missed doses in the last month: 0  Specialty Medication: tacrolimus 0.5 MG capsule (PROGRAF)  Patient is on additional specialty medications: Yes  Additional Specialty Medications: valGANciclovir 450 mg tablet (VALCYTE)  Patient Reported Additional Medication X Missed Doses in the Last Month: 0  Patient is on more than two specialty medications: No  Informant: patient              Were doses missed due to medication being on hold? No    Tacrolimus 0.5 mg: 6 days of medicine on hand   Valganciclovir 450 mg: 7 days of medicine on hand       REFERRAL TO PHARMACIST     Referral to the pharmacist: Not needed      [facility]     Shipping address confirmed in Epic.       Delivery Scheduled: Yes, Expected medication delivery date: 01/13/23.     Medication will be delivered via Same Day Courier to the prescription address in Epic WAM.    [name]   [facility] Specialty Technician

## 2023-01-13 MED FILL — VALGANCICLOVIR 450 MG TABLET: ORAL | 30 days supply | Qty: 30 | Fill #6

## 2023-01-13 MED FILL — JANUVIA 100 MG TABLET: ORAL | 90 days supply | Qty: 90 | Fill #1

## 2023-01-17 ENCOUNTER — Ambulatory Visit: Admit: 2023-01-17 | Discharge: 2023-01-18 | Payer: MEDICARE

## 2023-01-17 DIAGNOSIS — S4991XA Unspecified injury of right shoulder and upper arm, initial encounter: Principal | ICD-10-CM

## 2023-01-17 NOTE — Unmapped (Signed)
MRI Scheduling and follow up instructions:    Your MRI may require authorization from your insurance and the [facility] Radiology Department should call you directly in 2-3 business days to schedule.  You may also call them at [phone], option 1 to schedule.  Please schedule a follow-up appointment with the appropriate provider before you leave today to review your MRI results as our [facility] provider does not reach out with results .  If you do not have a scheduled follow-up or your MRI is scheduled after your follow-up, please call the [facility], [phone] to make a follow-up appointment 2-3 days after your MRI to review the results of the test.  We  DO NOT give results over the phone or via mychart.    Thank you ,    [facility]

## 2023-01-17 NOTE — Unmapped (Signed)
ORTHOPAEDIC NOTE     Desarie Feild L. Madaline Lefeber, PA-C        [name]    MRN: [id number]  DOB: November 09, 1950    Date of visit: 01/17/2023    Clinic location: [place]     ASSESSMENT:     Right shoulder injury sustained on 01/15/2023     PLAN:     I discussed the option of giving this time with physical therapy or ordering an MRI and given his weakness and activity level he would like to proceed with an MRI  In the meantime he will continue using OTC analgesics  -Discussed treatment options and patient was amenable to the above plan and was instructed to call and be seen if there is any increasing pain or concerns.     Follow up: Sports med APP to review MRI results       Chief Complaint:     Right shoulder injury     SUBJECTIVE:     HPI: Spencer Pena. is a RHD 72 y.o. with a PMHx as below presenting to [facility] complaining of right shoulder injury sustained on 01/15/2023.  He states about a month ago he was lifting a heavy bag on his shoulder and his shoulder started hurting him but over the weekend he was moving a pallet and felt a pop in his shoulder.  He has had difficulty raising his arm since his injury.  Pain is referable to the proximal biceps and lateral deltoid.  Denies numbness, tingling, weakness to the arm.       Allergies  Allergies   Allergen Reactions    Cetuximab Anaphylaxis     Chest pain/rapid heart rate/ BP dropped    Enalapril      Angioedema      Venom-Honey Bee Anaphylaxis    Nsaids (Non-Steroidal Anti-Inflammatory Drug)      Interaction with transplant medications    Pollen Extracts Other (See Comments)     Nasal stuffiness, coughing, sneezing     Past Medical History  Past Medical History:   Diagnosis Date    Arthritis     BPH (benign prostatic hyperplasia)     Cancer (CMS-HCC)     Colon 2006;  Liver 2006    Cataract     Colon cancer (CMS-HCC) 2006    Diabetes mellitus (CMS-HCC) 2001    Type II    Diverticulosis     Dry eyes     Emphysema of lung (CMS-HCC)     History of chemotherapy 2006-2007    History of iron deficiency anemia     History of transfusion     Internal hemorrhoids     Lung replaced by transplant (CMS-HCC) 07/31/2020    Medical history reviewed with no changes 05/22/2018    per pt        PHYSICAL EXAM:     MSK: Right shoulder  Inspection: No edema, no erythema, skin intact, no Popeye deformity noted  Palpation: Tenderness on the proximal biceps as well as the lateral deltoid and subacromial space  ROM: Passively has full range of motion of the shoulder actively cannot forward flex beyond 30 degrees  Strength: Pain limits strength in all planes range of motion of the shoulder  Pain with all provocative motion of the shoulder  normal sensation RUE  radial pulses easily palpable     Imaging   Four views of the right Shoulder independently reviewed and interpreted by myself show cystic changes along the  humeral head with small ossification adjacent to the humeral head which could be early calcific tendinopathy with mild acromioclavicular and glenohumeral degenerative changes. No other obvious fractures, lucencies, dislocations, or acute abnormalities.    MEDICAL DECISION MAKING (level of service defined by 2/3 elements)     Number/Complexity of Problems Addressed 1 acute, uncomplicated illness or injury (99203/99213)   Amount/Complexity of Data to be Reviewed/Analyzed Independent interpretation of a test performed by another physician/other qualified health care professional (99204/99214)   Risk of Complications/Morbidity/Mortality of Management Over-the-counter Medications (99203/99213)   DME ORDER:  Dx:  ,                   cc:  Jamesetta Geralds, MD  *Patient note was created using Dragon Dictation sotware. Errors in syntax or grammar may not have been identified and edited on initial review.

## 2023-01-21 ENCOUNTER — Ambulatory Visit: Admit: 2023-01-21 | Discharge: 2023-01-22 | Payer: MEDICARE

## 2023-01-21 DIAGNOSIS — Z942 Lung transplant status: Principal | ICD-10-CM

## 2023-01-21 LAB — CBC W/ AUTO DIFF
BASOPHILS ABSOLUTE COUNT: 0 10*9/L (ref 0.0–0.1)
BASOPHILS RELATIVE PERCENT: 1.7 %
EOSINOPHILS ABSOLUTE COUNT: 0 10*9/L (ref 0.0–0.5)
EOSINOPHILS RELATIVE PERCENT: 3.7 %
HEMATOCRIT: 33.4 % — ABNORMAL LOW (ref 39.0–48.0)
HEMOGLOBIN: 10.8 g/dL — ABNORMAL LOW (ref 12.9–16.5)
LYMPHOCYTES ABSOLUTE COUNT: 0.5 10*9/L — ABNORMAL LOW (ref 1.1–3.6)
LYMPHOCYTES RELATIVE PERCENT: 38.6 %
MEAN CORPUSCULAR HEMOGLOBIN CONC: 32.4 g/dL (ref 32.0–36.0)
MEAN CORPUSCULAR HEMOGLOBIN: 27.7 pg (ref 25.9–32.4)
MEAN CORPUSCULAR VOLUME: 85.4 fL (ref 77.6–95.7)
MEAN PLATELET VOLUME: 7.9 fL (ref 6.8–10.7)
MONOCYTES ABSOLUTE COUNT: 0.2 10*9/L — ABNORMAL LOW (ref 0.3–0.8)
MONOCYTES RELATIVE PERCENT: 15.5 %
NEUTROPHILS ABSOLUTE COUNT: 0.5 10*9/L — ABNORMAL LOW (ref 1.8–7.8)
NEUTROPHILS RELATIVE PERCENT: 40.5 %
NUCLEATED RED BLOOD CELLS: 0 /100{WBCs} (ref <=4)
PLATELET COUNT: 148 10*9/L — ABNORMAL LOW (ref 150–450)
RED BLOOD CELL COUNT: 3.91 10*12/L — ABNORMAL LOW (ref 4.26–5.60)
RED CELL DISTRIBUTION WIDTH: 15.8 % — ABNORMAL HIGH (ref 12.2–15.2)
WBC ADJUSTED: 1.3 10*9/L — ABNORMAL LOW (ref 3.6–11.2)

## 2023-01-21 LAB — COMPREHENSIVE METABOLIC PANEL
ALBUMIN: 3.8 g/dL (ref 3.4–5.0)
ALKALINE PHOSPHATASE: 63 U/L (ref 46–116)
ALT (SGPT): 16 U/L (ref 10–49)
ANION GAP: 5 mmol/L (ref 5–14)
AST (SGOT): 22 U/L (ref <=34)
BILIRUBIN TOTAL: 0.5 mg/dL (ref 0.3–1.2)
BLOOD UREA NITROGEN: 29 mg/dL — ABNORMAL HIGH (ref 9–23)
BUN / CREAT RATIO: 21
CALCIUM: 9.2 mg/dL (ref 8.7–10.4)
CHLORIDE: 108 mmol/L — ABNORMAL HIGH (ref 98–107)
CO2: 30.9 mmol/L (ref 20.0–31.0)
CREATININE: 1.38 mg/dL — ABNORMAL HIGH
EGFR CKD-EPI (2021) MALE: 54 mL/min/{1.73_m2} — ABNORMAL LOW (ref >=60)
GLUCOSE RANDOM: 109 mg/dL (ref 70–179)
POTASSIUM: 4.4 mmol/L (ref 3.4–4.8)
PROTEIN TOTAL: 6.4 g/dL (ref 5.7–8.2)
SODIUM: 144 mmol/L (ref 135–145)

## 2023-01-21 LAB — PHOSPHORUS: PHOSPHORUS: 2.6 mg/dL (ref 2.4–5.1)

## 2023-01-21 LAB — TACROLIMUS LEVEL, TROUGH: TACROLIMUS, TROUGH: 4.5 ng/mL — ABNORMAL LOW (ref 5.0–15.0)

## 2023-01-21 LAB — MAGNESIUM: MAGNESIUM: 2.2 mg/dL (ref 1.6–2.6)

## 2023-01-21 LAB — IGG: GAMMAGLOBULIN; IGG: 756 mg/dL (ref 650–1600)

## 2023-01-21 MED ORDER — TACROLIMUS XR 1 MG TABLET,EXTENDED RELEASE 24 HR
ORAL_TABLET | Freq: Every day | ORAL | 3 refills | 90 days | Status: CP
Start: 2023-01-21 — End: 2024-01-21

## 2023-01-21 NOTE — Unmapped (Signed)
Called Spencer Mallet Sr. to discuss lab results. Tacrolimus level was decreased at 4.5 with a goal of 5-7. Medication regimen is currently 2/1. Discussed with LungTXPProviders: [name], CPP, dosing at this time increased to 2 mg BID. Repeat labs scheduled 2 weeks. Resulted labs reviewed. All questions answered. Pt verbalized understanding.        Will send script for Envarsus 3 mg to see if insurance will approve

## 2023-01-21 NOTE — Unmapped (Signed)
Orders only

## 2023-01-24 ENCOUNTER — Ambulatory Visit: Admit: 2023-01-24 | Discharge: 2023-01-25 | Payer: MEDICARE

## 2023-01-24 DIAGNOSIS — S46211A Strain of muscle, fascia and tendon of other parts of biceps, right arm, initial encounter: Principal | ICD-10-CM

## 2023-01-24 NOTE — Unmapped (Signed)
ORTHOPAEDIC NOTE     Landis Cassaro L. Gal Smolinski, PA-C        [name]    MRN: [id number]  DOB: 05/24/1950    Date of visit: 01/24/2023    Clinic location: [facility]     ASSESSMENT:     Right proximal biceps tendon rupture sustained on 01/22/2023     PLAN:     Patient understands that he will lose 5% of his strength and he will a cosmetic deformity but this should not cause a functional deficit  He will work on range of motion exercise of his elbow  -Advised OTC analgesic PRN pain  -Discussed treatment options and patient was amenable to the above plan and was instructed to call and be seen if there is any increasing pain or concerns.     Follow up: Sports med AP after previously scheduled MRI       Chief Complaint:     Right biceps injury     SUBJECTIVE:     HPI: Spencer Pena. is a RHD 72 y.o. with a PMHx as below presenting to [facility] complaining of right biceps tendon injury sustained on complaining of right biceps tendon injury stain on 01/22/2023 when he was working in his yard throwing down a drop wire and felt a pop in his arm.  He is noted swelling and bruising in the arm.  He has noted a deformity.       Allergies  Allergies   Allergen Reactions    Cetuximab Anaphylaxis     Chest pain/rapid heart rate/ BP dropped    Enalapril      Angioedema      Venom-Honey Bee Anaphylaxis    Nsaids (Non-Steroidal Anti-Inflammatory Drug)      Interaction with transplant medications    Pollen Extracts Other (See Comments)     Nasal stuffiness, coughing, sneezing     Past Medical History  Past Medical History:   Diagnosis Date    Arthritis     BPH (benign prostatic hyperplasia)     Cancer (CMS-HCC)     Colon 2006;  Liver 2006    Cataract     Colon cancer (CMS-HCC) 2006    Diabetes mellitus (CMS-HCC) 2001    Type II    Diverticulosis     Dry eyes     Emphysema of lung (CMS-HCC)     History of chemotherapy 2006-2007    History of iron deficiency anemia     History of transfusion     Internal hemorrhoids     Lung replaced by transplant (CMS-HCC) 07/31/2020    Medical history reviewed with no changes 05/22/2018    per pt        PHYSICAL EXAM:     MSK: Right elbow and upper arm  Inspection: Proximal humeral edema with ecchymosis along the distal biceps with obvious Popeye deformity  Palpation: Tenderness on the proximal biceps, no tenderness on the distal biceps, I am able to hook the distal biceps  ROM: Full range of motion elbow  Strength: Mild weakness with flexion of the elbow compared to the other side  normal sensation RUE  radial pulses easily palpable     Imaging   no new films obtained today    MEDICAL DECISION MAKING (level of service defined by 2/3 elements)     Number/Complexity of Problems Addressed 1 acute, uncomplicated illness or injury (99203/99213)   Amount/Complexity of Data to be Reviewed/Analyzed Independent interpretation of a test  performed by another physician/other qualified health care professional (99204/99214)   Risk of Complications/Morbidity/Mortality of Management Over-the-counter Medications (99203/99213)   DME ORDER:  Dx:  ,                   cc:  Jamesetta Geralds, MD  *Patient note was created using Dragon Dictation sotware. Errors in syntax or grammar may not have been identified and edited on initial review.

## 2023-01-25 NOTE — Unmapped (Addendum)
9/30 update: per coordinator CL, patient has decided not to start this medication, he will stay on IR tacrolimus. This envarsus rx has been discontinued. We will not reach out to patient for onboarding -ef      9/17 update: per coordinator CL, clinic will talk to patient first. Clinic will let us know if/when to reach out to patient on this medication -ef    9/17: epic notes say consider transition to envarsus. Reaching out to clinic for direction prior to contacting patient -ef        [facility] Specialty Medication Onboarding    Specialty Medication: Envarsus XR 1mg  tablet  Prior Authorization: Not Required   Financial Assistance: No - copay card or gant not available   Final Copay/Day Supply: $100 / 90 days    Insurance Restrictions: None     Notes to Pharmacist: $50 for 30ds  Credit Card on File: no    The triage team has completed the benefits investigation and has determined that the patient is able to fill this medication at [facility]. Please contact the patient to complete the onboarding or follow up with the prescribing physician as needed.

## 2023-02-02 DIAGNOSIS — Z942 Lung transplant status: Principal | ICD-10-CM

## 2023-02-02 MED ORDER — AZITHROMYCIN 250 MG TABLET
ORAL_TABLET | Freq: Every day | ORAL | 3 refills | 90 days | Status: CP
Start: 2023-02-02 — End: 2024-02-02
  Filled 2023-02-08: qty 90, 90d supply, fill #0

## 2023-02-02 MED FILL — ACETAMINOPHEN 500 MG TABLET: ORAL | 23 days supply | Qty: 180 | Fill #4

## 2023-02-02 MED FILL — METFORMIN 1,000 MG TABLET: ORAL | 90 days supply | Qty: 180 | Fill #1

## 2023-02-02 MED FILL — MAGNESIUM OXIDE 400 MG (241.3 MG MAGNESIUM) TABLET: ORAL | 40 days supply | Qty: 240 | Fill #3

## 2023-02-04 ENCOUNTER — Ambulatory Visit: Admit: 2023-02-04 | Discharge: 2023-02-05 | Payer: MEDICARE

## 2023-02-04 LAB — CBC W/ AUTO DIFF
BASOPHILS ABSOLUTE COUNT: 0 10*9/L (ref 0.0–0.1)
BASOPHILS RELATIVE PERCENT: 0.7 %
EOSINOPHILS ABSOLUTE COUNT: 0 10*9/L (ref 0.0–0.5)
EOSINOPHILS RELATIVE PERCENT: 0.2 %
HEMATOCRIT: 32.8 % — ABNORMAL LOW (ref 39.0–48.0)
HEMOGLOBIN: 10.6 g/dL — ABNORMAL LOW (ref 12.9–16.5)
LYMPHOCYTES ABSOLUTE COUNT: 0.8 10*9/L — ABNORMAL LOW (ref 1.1–3.6)
LYMPHOCYTES RELATIVE PERCENT: 14.4 %
MEAN CORPUSCULAR HEMOGLOBIN CONC: 32.4 g/dL (ref 32.0–36.0)
MEAN CORPUSCULAR HEMOGLOBIN: 27.2 pg (ref 25.9–32.4)
MEAN CORPUSCULAR VOLUME: 83.9 fL (ref 77.6–95.7)
MEAN PLATELET VOLUME: 7.4 fL (ref 6.8–10.7)
MONOCYTES ABSOLUTE COUNT: 0.5 10*9/L (ref 0.3–0.8)
MONOCYTES RELATIVE PERCENT: 8.8 %
NEUTROPHILS ABSOLUTE COUNT: 4.4 10*9/L (ref 1.8–7.8)
NEUTROPHILS RELATIVE PERCENT: 75.9 %
NUCLEATED RED BLOOD CELLS: 0 /100{WBCs} (ref <=4)
PLATELET COUNT: 174 10*9/L (ref 150–450)
RED BLOOD CELL COUNT: 3.91 10*12/L — ABNORMAL LOW (ref 4.26–5.60)
RED CELL DISTRIBUTION WIDTH: 16.3 % — ABNORMAL HIGH (ref 12.2–15.2)
WBC ADJUSTED: 5.8 10*9/L (ref 3.6–11.2)

## 2023-02-04 LAB — COMPREHENSIVE METABOLIC PANEL
ALBUMIN: 3.7 g/dL (ref 3.4–5.0)
ALKALINE PHOSPHATASE: 80 U/L (ref 46–116)
ALT (SGPT): 17 U/L (ref 10–49)
ANION GAP: 4 mmol/L — ABNORMAL LOW (ref 5–14)
AST (SGOT): 19 U/L (ref <=34)
BILIRUBIN TOTAL: 0.3 mg/dL (ref 0.3–1.2)
BLOOD UREA NITROGEN: 28 mg/dL — ABNORMAL HIGH (ref 9–23)
BUN / CREAT RATIO: 20
CALCIUM: 8.9 mg/dL (ref 8.7–10.4)
CHLORIDE: 109 mmol/L — ABNORMAL HIGH (ref 98–107)
CO2: 30.5 mmol/L (ref 20.0–31.0)
CREATININE: 1.41 mg/dL — ABNORMAL HIGH
EGFR CKD-EPI (2021) MALE: 53 mL/min/{1.73_m2} — ABNORMAL LOW (ref >=60)
GLUCOSE RANDOM: 117 mg/dL (ref 70–179)
POTASSIUM: 4.2 mmol/L (ref 3.4–4.8)
PROTEIN TOTAL: 6.2 g/dL (ref 5.7–8.2)
SODIUM: 143 mmol/L (ref 135–145)

## 2023-02-04 LAB — TACROLIMUS LEVEL, TROUGH: TACROLIMUS, TROUGH: 6.2 ng/mL (ref 5.0–15.0)

## 2023-02-04 LAB — PHOSPHORUS: PHOSPHORUS: 2.8 mg/dL (ref 2.4–5.1)

## 2023-02-04 LAB — IGG: GAMMAGLOBULIN; IGG: 696 mg/dL (ref 650–1600)

## 2023-02-04 LAB — MAGNESIUM: MAGNESIUM: 2.4 mg/dL (ref 1.6–2.6)

## 2023-02-04 NOTE — Unmapped (Signed)
Called [name] to discuss lab results. Tacrolimus level was normal at 6.2 with a goal of 5-7. Medication regimen is currently 2 mg BID. Dosing at this time to remain the same. Repeat labs scheduled monthly. Resulted labs reviewed. All questions answered. Pt verbalized understanding.

## 2023-02-08 ENCOUNTER — Ambulatory Visit: Admit: 2023-02-08 | Discharge: 2023-02-09 | Payer: MEDICARE

## 2023-02-08 MED FILL — AZATHIOPRINE 50 MG TABLET: ORAL | 90 days supply | Qty: 90 | Fill #1

## 2023-02-08 MED FILL — MONTELUKAST 10 MG TABLET: ORAL | 90 days supply | Qty: 90 | Fill #2

## 2023-02-09 DIAGNOSIS — Z942 Lung transplant status: Principal | ICD-10-CM

## 2023-02-16 DIAGNOSIS — K219 Gastro-esophageal reflux disease without esophagitis: Principal | ICD-10-CM

## 2023-02-16 MED ORDER — PANTOPRAZOLE 40 MG TABLET,DELAYED RELEASE
ORAL_TABLET | Freq: Every day | ORAL | 3 refills | 90 days | Status: CP
Start: 2023-02-16 — End: ?
  Filled 2023-02-22: qty 90, 90d supply, fill #0

## 2023-02-16 NOTE — Unmapped (Signed)
[facility] Pharm[facility] Refill Coordination Note    Specialty Medication(s) to be Shipped:   Transplant: valgancyclovir 450mg  and Prednisone 5mg     Other medication(s) to be shipped:  alendronate,pantoprazole     Spencer Mallet Sr., DOB: 21-Oct-1950  Phone: [phone] (home) [phone] (work)      All above HIPAA information was verified with patient.     Was a Nurse, learning disability used for this call? No    Completed refill call assessment today to schedule patient's medication shipment from the [facility]  [phone]).  All relevant notes have been reviewed.     Specialty medication(s) and dose(s) confirmed: Regimen is correct and unchanged.   Changes to medications: [name] reports no changes at this time.  Changes to insurance: No  New side effects reported not previously addressed with a pharmacist or physician: None reported  Questions for the pharmacist: No    Confirmed patient received a Conservation officer, historic buildings and a Surveyor, mining with first shipment. The patient will receive a drug information handout for each medication shipped and additional FDA Medication Guides as required.       DISEASE/MEDICATION-SPECIFIC INFORMATION        N/A    SPECIALTY MEDICATION ADHERENCE     Medication Adherence    Patient reported X missed doses in the last month: 0  Specialty Medication: predniSONE 5 MG tablet (DELTASONE)  Patient is on additional specialty medications: Yes  Additional Specialty Medications: Valganciclovir 450mg   Patient Reported Additional Medication X Missed Doses in the Last Month: 0  Patient is on more than two specialty medications: No  Any gaps in refill history greater than 2 weeks in the last 3 months: no  Demonstrates understanding of importance of adherence: yes  Informant: patient  Reliability of informant: reliable  Provider-estimated medication adherence level: good  Patient is at risk for Non-Adherence: No  Reasons for non-adherence: no problems identified  Confirmed plan for next specialty medication refill: delivery by pharmacy  Refills needed for supportive medications: not needed          Refill Coordination    Has the Patients' Contact Information Changed: No  Is the Shipping Address Different: No         Were doses missed due to medication being on hold? No    Prednisone 5 mg:7  days of medicine on hand   valganciclovir 450 mg: 7 days of medicine on hand       REFERRAL TO PHARMACIST     Referral to the pharmacist: Not needed      [facility]     Shipping address confirmed in Epic.       Delivery Scheduled: Yes, Expected medication delivery date: 10/15.     Medication will be delivered via UPS to the prescription address in Epic WAM.    [facility]  Specialty Technician

## 2023-02-16 NOTE — Unmapped (Signed)
Pt request for RX Refill

## 2023-02-22 MED FILL — ALENDRONATE 70 MG TABLET: ORAL | 84 days supply | Qty: 12 | Fill #1

## 2023-02-22 MED FILL — VALGANCICLOVIR 450 MG TABLET: ORAL | 30 days supply | Qty: 30 | Fill #7

## 2023-02-22 MED FILL — PREDNISONE 5 MG TABLET: ORAL | 90 days supply | Qty: 135 | Fill #2

## 2023-02-24 NOTE — Unmapped (Signed)
[facility] CLINIC PHARMACY NOTE  02/25/2023   Spencer BOWCUTT Sr.  [id number]    Medication changes today:   1. Start amlodipine 5 mg daily  2. Decrease magnesium to 800 mg BID  3. Covid and influenza vaccine administered today    Education/Adherence tools provided today:  1. provided updated medication list    Follow up items:  1. Home BP readings  2. Tac levels    Next visit with pharmacy in PRN  ____________________________________________________________________    Spencer Mallet Sr. is a 72 y.o. 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     Other PMH significant for hypertension, T2DM, colon cancer s/p hemicolectomy and R hepatectomy for metastasis + chemotherapy (2007), BPH    Seen by pharmacy today for: medication management and  pill box assessment and adherence education.     CC:  Patient  has no complaints today       Vitals:    02/25/23 0900   BP: 157/78   Pulse: 75   SpO2: 99%         Allergies   Allergen Reactions    Cetuximab Anaphylaxis     Chest pain/rapid heart rate/ BP dropped    Enalapril      Angioedema      Venom-Honey Bee Anaphylaxis    Nsaids (Non-Steroidal Anti-Inflammatory Drug)      Interaction with transplant medications    Pollen Extracts Other (See Comments)     Nasal stuffiness, coughing, sneezing     Medications reviewed in EPIC medication station and updated today by the clinical pharmacist practitioner. Medication list includes revisions made during today???s encounter.    Outpatient Encounter Medications as of 02/25/2023   Medication Sig Dispense Refill    acetaminophen (TYLENOL EXTRA STRENGTH) 500 MG tablet Take 1-2 tablets (500-1,000 mg total) by mouth every six (6) hours as needed for pain. 180 tablet PRN    alendronate (FOSAMAX) 70 MG tablet Take 1 tablet (70 mg total) by mouth every seven (7) days. 12 tablet 3    amlodipine (NORVASC) 5 MG tablet Take 1 tablet (5 mg total) by mouth daily. 30 tablet 11    aspirin (ECOTRIN) 81 MG tablet Take 1 tablet (81 mg total) by mouth daily. 90 tablet 3    atorvastatin (LIPITOR) 40 MG tablet Take 1 tablet (40 mg total) by mouth daily. 90 tablet 3    azathioprine (IMURAN) 50 mg tablet Take 1 tablet (50 mg total) by mouth daily. 90 tablet 3    azithromycin (ZITHROMAX) 250 MG tablet Take 1 tablet (250 mg total) by mouth daily. 90 tablet 3    calcium carbonate (CALCIUM 600) 1,500 mg (600 mg elem calcium) tablet Take 1 tablet (600 mg of elem calcium total) by mouth Two (2) times a day. 60 tablet 11    cholecalciferol, vitamin D3 25 mcg, 1,000 units,, 1,000 unit (25 mcg) tablet Take 1 tablet (25 mcg total) by mouth daily. 90 tablet 3    fluticasone propion-salmeterol (ADVAIR HFA) 115-21 mcg/actuation inhaler Inhale 2 puffs Two (2) times a day. 12 g 11    fluticasone propionate (CUTIVATE) 0.05 % cream Apply 1 Application topically daily as needed.      fluticasone propionate (FLONASE) 50 mcg/actuation nasal spray Use 2 sprays into each nostril daily. 16 g 11    magnesium oxide (MAG-OX) 400 mg (241.3 mg elemental magnesium) tablet Take 2 tablets (800 mg total) by mouth two (2) times a  day. 360 tablet 3    metFORMIN (GLUCOPHAGE) 1000 MG tablet Take 1 tablet (1,000 mg total) by mouth Two (2) times a day. 180 tablet 3    montelukast (SINGULAIR) 10 mg tablet Take 1 tablet (10 mg total) by mouth daily. 90 tablet 3    pantoprazole (PROTONIX) 40 MG tablet Take 1 tablet (40 mg total) by mouth daily. 90 tablet 3    predniSONE (DELTASONE) 5 MG tablet Take 1.5 tablets (7.5 mg total) by mouth daily. 135 tablet 3    sildenafiL, pulm.hypertension, (REVATIO) 20 mg tablet Take 2-3 pills as needed (up to 60 mg) as needed for erectile dysfunction 60 tablet 3    SITagliptin phosphate (JANUVIA) 100 MG tablet Take 1 tablet (100 mg total) by mouth daily. 90 tablet 3    sulfamethoxazole-trimethoprim (BACTRIM) 400-80 mg per tablet Take 1 tablet (80 mg of trimethoprim total) by mouth every Monday, Wednesday, and Friday. 36 tablet 3    valGANciclovir (VALCYTE) 450 mg tablet Take 1 tablet (450 mg total) by mouth daily. 30 tablet 11    [DISCONTINUED] aspirin (ECOTRIN) 81 MG tablet Take 1 tablet (81 mg total) by mouth daily. 90 tablet 3    [DISCONTINUED] fluticasone propion-salmeterol (ADVAIR HFA) 115-21 mcg/actuation inhaler Inhale 2 puffs Two (2) times a day. 12 g 11    [DISCONTINUED] fluticasone propionate (FLONASE) 50 mcg/actuation nasal spray Use 2 sprays into each nostril daily. 16 g 11    [DISCONTINUED] magnesium oxide (MAG-OX) 400 mg (241.3 mg elemental magnesium) tablet Take 3 tablets (1,200 mg total) by mouth Two (2) times a day. 180 tablet 11    [DISCONTINUED] tacrolimus (PROGRAF) 0.5 MG capsule Take 4 capsules (2 mg total) by mouth daily AND 2 capsules (1 mg total) nightly. 540 capsule 3    [DISCONTINUED] terazosin (HYTRIN) 1 MG capsule HOLD       No facility-administered encounter medications on file as of 02/25/2023.       Immunosuppression:  Induction agent: basiliximab    Current immunosuppression:  tacrolimus 2 mg BID  Prograf goal:  5-7  (lower goal given CKD, no signs of rejection, tolerating imuran)  Azathioprine 50 mg daily    changed from MMF 2/2 diarrhea; dose decreased from 100mg  to 75mg  on 08/01/20 given down-trending ANC and IgG   dose decrease to 50mg  on 10/03/20   prednisone 7.5 mg daily    Patient is tolerating immunosuppression well     IMMUNOSUPPRESSION DRUG LEVELS:  Lab Results   Component Value Date    Tacrolimus, Trough 9.8 02/25/2023    Tacrolimus, Trough 6.2 02/04/2023    Tacrolimus, Trough 4.5 (L) 01/21/2023     Tacrolimus level is accurate 12 hour trough.    Graft function assessment via PFTs: stable  DSA:  NTD, last checked 11/19/22  Biopsies to date: none  WBC/ANC:  wnl      Plan: Will maintain current immunosuppression pending ISN levels    OI prophylaxis:   CMV Status: D+/ R-, high risk. CMV prophylaxis with valganciclovir 450 mg daily indefinately. Negative VL 12/17/2022. T cell assay with low immunity 09/2022.   Estimated Creatinine Clearance: 46.4 mL/min (A) (based on SCr of 1.37 mg/dL (H)).  PCP: Prophylaxis with bactrim SS 1 tab MWF indefinitely.  Fungal: Completed posaconazole 300 mg daily for prophylaxis x 3 months per protocol - COMPLETE (08/20/20)    Patient is tolerating infectious prophylaxis well.    Plan: continue to monitor. New Rx sent for Bactrim as it had been removed from  med list. Recheck T-cell immunity panel in 1 year.    Infectious History:  Patient does not have a history of pathogenic infectious complications following lung transplant. 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 Previous antiinfective history use includes meropenem x7 days for broad spectrum gram negative and aspiration PNA coverage post-op per ICID.     Plan: Continue to monitor.    BOS/CLAD prophylaxis:  Current meds include: azithromycin 250 mg daily, motelukast 10 mg, and pantoprazole 40 mg daily, advair inhaler  Esophageal motility study on 07/23/20 showed abnormal distal esophagus exposure during recumbent posture and overall borderline normal. 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 Patient denies any reflux symptoms with every day dosing.     Plan: Continue to monitor.     CV Prophylaxis:   Aspirin: asa 81 mg daily  The 10-year ASCVD risk score (Arnett DK, et al., 2019) is: 42.3%  Statin: atorvastatin; 40 mg daily - pt tolerated switch from pravastatin well  Lipid panel 11/19/22: LDL of 46 --> within goal    Plan: Continue to monitor    BP/Edema: Goal <130/80.   Encounter vitals reported above. Recent clinic BP consistently above goal.  Home BP ranges: Not checking at home  Weights: stable  Meds include: none   Plan: BP out of goal. Start amlodipine 5 mg daily. Continue to monitor    Anemia:  H/H:   Lab Results   Component Value Date    HGB 11.7 (L) 02/25/2023     Lab Results   Component Value Date    HCT 36.4 (L) 02/25/2023     Iron panel:  Lab Results   Component Value Date IRON 64 (L) 08/27/2022    TIBC 351 08/27/2022    FERRITIN 20.0 08/27/2022     Lab Results   Component Value Date    Iron Saturation (%) 18 (L) 08/27/2022     Prior anemia therapy: IV Ferrlecit (1/10-1/13), IV iron sucrose (1000 mg in 14 day period) completed 09/15/21    Plan: continue to monitor      DM:   Lab Results   Component Value Date    A1C 5.9 (H) 11/19/2022   Goal A1c < 7%.  Current meds include:  Metformin 1000 mg PO BID  Januvia 100 mg PO daily    Home BS log: FBG 110-120s   Hypoglycemia: no  Plan:  continue current reigmen    Electrolytes: wnl  Current meds include: MgOx 1200 mg BID  BM: Recent diarrhea resolving this past week  Plan: Decrease magnesium to 800 mg BID    Bone health:  Patient currently on long-term steroid therapy.  Vitamin D Level:  81.3 (12/10/22) . Goal > 30.   Last DEXA results (11/17/21): osteopenia  Current meds include: vitamin D3 1000 units daily, calcium carbonate 600 mg BID, alendronate 70 mg weekly    Plan: Continue current therapy. Continue to monitor.    Women's/Men's Health:  ALF SMREKAR Sr. is a 72 y.o. male. Patient reports  history of BPH . Reports that BPH symptoms are controlled.  Current meds include: None  -terazosin 3 mg PO nightly (increased 09/05/20 given increased BPH symptoms, held at last visit and recommended to stop by urology on 12/31/22)   Plan:  Continue to monitor.    Adherence:   Patient has good understanding of medications.  Patient does fill their own pill box on a regular basis at home. Wife had previously been filing for  him, but he now handles it on his own for the most part.   Patient brought medication card: no  Pill box: did not bring  Patient requested refills for the following meds: None  Corrections needed in Epic medication list: Rx sent for Bactrim, had been removed from med list    Plan: Provided basic adherence counseling/intervention    I spent a total of 20 minutes face to face with the patient delivering clinical care and providing education/counseling.    Patient was reviewed with Dr. Dudley Major who were in agreement with the stated plan.    During this visit, the following was completed:   BG log data assessment  Labs ordered and evaluated  complex treatment plan >1 DS   Patient education was completed for 11-24 minutes     All questions/concerns were addressed to the patient's satisfaction.  __________________________________________  PATIENT SEEN AND EVALUATED BY:    Beryle Lathe, PharmD  PGY2 Solid Organ Transplant Pharmacy Resident

## 2023-02-25 ENCOUNTER — Ambulatory Visit: Admit: 2023-02-25 | Discharge: 2023-02-25 | Payer: MEDICARE

## 2023-02-25 ENCOUNTER — Encounter: Admit: 2023-02-25 | Discharge: 2023-02-25 | Payer: MEDICARE

## 2023-02-25 ENCOUNTER — Ambulatory Visit: Admit: 2023-02-25 | Discharge: 2023-02-25 | Payer: MEDICARE | Attending: Internal Medicine | Primary: Internal Medicine

## 2023-02-25 DIAGNOSIS — Z942 Lung transplant status: Principal | ICD-10-CM

## 2023-02-25 DIAGNOSIS — C187 Malignant neoplasm of sigmoid colon: Principal | ICD-10-CM

## 2023-02-25 DIAGNOSIS — Z79899 Other long term (current) drug therapy: Principal | ICD-10-CM

## 2023-02-25 DIAGNOSIS — I10 Essential (primary) hypertension: Principal | ICD-10-CM

## 2023-02-25 DIAGNOSIS — G4733 Obstructive sleep apnea (adult) (pediatric): Principal | ICD-10-CM

## 2023-02-25 LAB — COMPREHENSIVE METABOLIC PANEL
ALBUMIN: 4.2 g/dL (ref 3.4–5.0)
ALKALINE PHOSPHATASE: 76 U/L (ref 46–116)
ALT (SGPT): 17 U/L (ref 10–49)
ANION GAP: 5 mmol/L (ref 5–14)
AST (SGOT): 24 U/L (ref <=34)
BILIRUBIN TOTAL: 0.5 mg/dL (ref 0.3–1.2)
BLOOD UREA NITROGEN: 32 mg/dL — ABNORMAL HIGH (ref 9–23)
BUN / CREAT RATIO: 23
CALCIUM: 9.4 mg/dL (ref 8.7–10.4)
CHLORIDE: 105 mmol/L (ref 98–107)
CO2: 30 mmol/L (ref 20.0–31.0)
CREATININE: 1.37 mg/dL — ABNORMAL HIGH
EGFR CKD-EPI (2021) MALE: 55 mL/min/{1.73_m2} — ABNORMAL LOW (ref >=60)
GLUCOSE RANDOM: 122 mg/dL — ABNORMAL HIGH (ref 70–99)
POTASSIUM: 4.3 mmol/L (ref 3.4–4.8)
PROTEIN TOTAL: 7.1 g/dL (ref 5.7–8.2)
SODIUM: 140 mmol/L (ref 135–145)

## 2023-02-25 LAB — TACROLIMUS LEVEL, TROUGH: TACROLIMUS, TROUGH: 9.8 ng/mL (ref 5.0–15.0)

## 2023-02-25 LAB — CBC W/ AUTO DIFF
BASOPHILS ABSOLUTE COUNT: 0 10*9/L (ref 0.0–0.1)
BASOPHILS RELATIVE PERCENT: 0.5 %
EOSINOPHILS ABSOLUTE COUNT: 0.1 10*9/L (ref 0.0–0.5)
EOSINOPHILS RELATIVE PERCENT: 2.3 %
HEMATOCRIT: 36.4 % — ABNORMAL LOW (ref 39.0–48.0)
HEMOGLOBIN: 11.7 g/dL — ABNORMAL LOW (ref 12.9–16.5)
LYMPHOCYTES ABSOLUTE COUNT: 0.7 10*9/L — ABNORMAL LOW (ref 1.1–3.6)
LYMPHOCYTES RELATIVE PERCENT: 12.5 %
MEAN CORPUSCULAR HEMOGLOBIN CONC: 32.3 g/dL (ref 32.0–36.0)
MEAN CORPUSCULAR HEMOGLOBIN: 26.7 pg (ref 25.9–32.4)
MEAN CORPUSCULAR VOLUME: 82.9 fL (ref 77.6–95.7)
MEAN PLATELET VOLUME: 7.8 fL (ref 6.8–10.7)
MONOCYTES ABSOLUTE COUNT: 0.5 10*9/L (ref 0.3–0.8)
MONOCYTES RELATIVE PERCENT: 8.6 %
NEUTROPHILS ABSOLUTE COUNT: 4 10*9/L (ref 1.8–7.8)
NEUTROPHILS RELATIVE PERCENT: 76.1 %
PLATELET COUNT: 182 10*9/L (ref 150–450)
RED BLOOD CELL COUNT: 4.39 10*12/L (ref 4.26–5.60)
RED CELL DISTRIBUTION WIDTH: 16.5 % — ABNORMAL HIGH (ref 12.2–15.2)
WBC ADJUSTED: 5.3 10*9/L (ref 3.6–11.2)

## 2023-02-25 LAB — CMV DNA, QUANTITATIVE, PCR: CMV VIRAL LD: NOT DETECTED

## 2023-02-25 LAB — EBV QUANTITATIVE PCR, BLOOD: EBV VIRAL LOAD RESULT: NOT DETECTED

## 2023-02-25 LAB — MAGNESIUM: MAGNESIUM: 2.3 mg/dL (ref 1.6–2.6)

## 2023-02-25 LAB — IGG: GAMMAGLOBULIN; IGG: 731 mg/dL (ref 650–1600)

## 2023-02-25 LAB — PHOSPHORUS: PHOSPHORUS: 2.7 mg/dL (ref 2.4–5.1)

## 2023-02-25 MED ORDER — ASPIRIN 81 MG TABLET,DELAYED RELEASE
ORAL_TABLET | Freq: Every day | ORAL | 3 refills | 90 days | Status: CP
Start: 2023-02-25 — End: ?

## 2023-02-25 MED ORDER — MAGNESIUM OXIDE 400 MG (241.3 MG MAGNESIUM) TABLET
ORAL_TABLET | Freq: Two times a day (BID) | ORAL | 3 refills | 90 days | Status: CP
Start: 2023-02-25 — End: 2024-02-25
  Filled 2023-04-26: qty 120, 30d supply, fill #0

## 2023-02-25 MED ORDER — SULFAMETHOXAZOLE 400 MG-TRIMETHOPRIM 80 MG TABLET
ORAL_TABLET | ORAL | 3 refills | 84 days | Status: CP
Start: 2023-02-25 — End: 2024-02-25
  Filled 2023-03-10: qty 36, 84d supply, fill #0

## 2023-02-25 MED ORDER — FLUTICASONE PROPIONATE 50 MCG/ACTUATION NASAL SPRAY,SUSPENSION
Freq: Every day | NASAL | 11 refills | 60 days | Status: CP
Start: 2023-02-25 — End: ?

## 2023-02-25 MED ORDER — FLUTICASONE PROPIONATE 115 MCG-SALMETEROL 21 MCG/ACTUATION HFA INHALER
Freq: Two times a day (BID) | RESPIRATORY_TRACT | 11 refills | 30 days | Status: CP
Start: 2023-02-25 — End: ?

## 2023-02-25 MED ORDER — AMLODIPINE 5 MG TABLET
ORAL_TABLET | ORAL | 11 refills | 30 days | Status: CP
Start: 2023-02-25 — End: 2024-02-25
  Filled 2023-02-25: qty 30, 30d supply, fill #0

## 2023-02-25 MED ORDER — TACROLIMUS 0.5 MG CAPSULE, IMMEDIATE-RELEASE
ORAL_CAPSULE | ORAL | 3 refills | 90 days | Status: CP
Start: 2023-02-25 — End: 2024-02-25

## 2023-02-25 NOTE — Unmapped (Signed)
In with Dr. Dudley Major to see Spencer Mallet Sr.. Doing well, PFTs stable.  Will decrease magnesium d/t diarrhea.  Will restart norvasc for increased BP.  Receiving flu and covid vaccines today.  Return to clinic in 3 months.  Questions answered, verbalized understanding.

## 2023-02-25 NOTE — Unmapped (Signed)
Please see pharmacy visit for documentation.    Per provider, the patient received INFLUENZA IIV3 HIGH DOSE 35YRS+(FLUZONE) and Covid-19 Vac, (80yr+) (Comirnaty) [employer]    vaccine.  Patient ID verified with name and date of birth.  All screening questions were answered.  Vaccine(s) were administered as ordered.  See immunization history for documentation.  Patient tolerated the injection(s) well with no issues noted.  Vaccine Information sheet given to the patient.

## 2023-02-25 NOTE — Unmapped (Signed)
Called Spencer Mallet Sr. to discuss lab results. Tacrolimus level was increased at 9.8 with a goal of 5-7. Medication regimen is currently 2 mg BID. Discussed with [name], CPP dosing at this time decreased to  2/1. Repeat labs scheduled two weeks. Resulted labs reviewed. All questions answered. Pt verbalized understanding.

## 2023-02-25 NOTE — Unmapped (Signed)
Pulmonary Transplant Clinic    HISTORY:   HPI:  Mr. Tripper Schneekloth is a 72 y.o.  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     --> basiliximab induction    --> CMV high risk (D+/R-)    --> EBV moderate risk (D+/R+)    --> Not Increased Risk Donor  - Colon Cancer 2007    --> s/p resection of liver mets    --> s/p resection of 6 inches of colon    --> FOLFOX Chemo  - type 2 diabetes mellitus     --> not insulin dependent  - OA    SOCIAL HISTORY:   - 20pk years, quit in 1991  - previously drank about 10 drinks per week   - Worked maintenance at [employer]  - Married and lives in [place]    FAMILY HISTORY:   - Parents lived into late 90's    MEDS: (personally reviewed in EPIC, pertinent meds noted below)   Relevant  - tac 2mg  BID (5-7)  - aza 50mg  daily   - pred 7.5mg  daily  Current Outpatient Medications on File Prior to Visit   Medication Sig Dispense Refill    acetaminophen (TYLENOL EXTRA STRENGTH) 500 MG tablet Take 1-2 tablets (500-1,000 mg total) by mouth every six (6) hours as needed for pain. 180 tablet PRN    alendronate (FOSAMAX) 70 MG tablet Take 1 tablet (70 mg total) by mouth every seven (7) days. 12 tablet 3    aspirin (ECOTRIN) 81 MG tablet Take 1 tablet (81 mg total) by mouth daily. 90 tablet 3    atorvastatin (LIPITOR) 40 MG tablet Take 1 tablet (40 mg total) by mouth daily. 90 tablet 3    azathioprine (IMURAN) 50 mg tablet Take 1 tablet (50 mg total) by mouth daily. 90 tablet 3    azithromycin (ZITHROMAX) 250 MG tablet Take 1 tablet (250 mg total) by mouth daily. 90 tablet 3    calcium carbonate (CALCIUM 600) 1,500 mg (600 mg elem calcium) tablet Take 1 tablet (600 mg of elem calcium total) by mouth Two (2) times a day. 60 tablet 11    cholecalciferol, vitamin D3 25 mcg, 1,000 units,, 1,000 unit (25 mcg) tablet Take 1 tablet (25 mcg total) by mouth daily. 90 tablet 3    fluticasone propion-salmeterol (ADVAIR HFA) 115-21 mcg/actuation inhaler Inhale 2 puffs Two (2) times a day. 12 g 11    fluticasone propionate (CUTIVATE) 0.05 % cream Apply 1 Application topically daily as needed.      fluticasone propionate (FLONASE) 50 mcg/actuation nasal spray Use 2 sprays into each nostril daily. 16 g 11    magnesium oxide (MAG-OX) 400 mg (241.3 mg elemental magnesium) tablet Take 3 tablets (1,200 mg total) by mouth Two (2) times a day. 180 tablet 11    metFORMIN (GLUCOPHAGE) 1000 MG tablet Take 1 tablet (1,000 mg total) by mouth Two (2) times a day. 180 tablet 3  montelukast (SINGULAIR) 10 mg tablet Take 1 tablet (10 mg total) by mouth daily. 90 tablet 3    pantoprazole (PROTONIX) 40 MG tablet Take 1 tablet (40 mg total) by mouth daily. 90 tablet 3    predniSONE (DELTASONE) 5 MG tablet Take 1.5 tablets (7.5 mg total) by mouth daily. 135 tablet 3    sildenafiL, pulm.hypertension, (REVATIO) 20 mg tablet Take 2-3 pills as needed (up to 60 mg) as needed for erectile dysfunction 60 tablet 3    SITagliptin phosphate (JANUVIA) 100 MG tablet Take 1 tablet (100 mg total) by mouth daily. 90 tablet 3    tacrolimus (PROGRAF) 0.5 MG capsule Take 4 capsules (2 mg total) by mouth daily AND 2 capsules (1 mg total) nightly. 540 capsule 3    valGANciclovir (VALCYTE) 450 mg tablet Take 1 tablet (450 mg total) by mouth daily. 30 tablet 11     No current facility-administered medications on file prior to visit.       OBJECTIVE DATA:   PHYSICAL EXAM:  BP 157/78 (BP Site: L Arm, BP Position: Sitting)  - Pulse 75  - Ht 171.5 cm (5' 7.5)  - Wt 78.5 kg (173 lb)  - SpO2 99% - BMI 26.70 kg/m??   Gen: - awake, alert, in NAD, very pleasant, upbeat  Vascular: good pulses throughout  CV: regular rate rhythm  Pulm: good air movement bilaterally, normal work of breathing  Abd: non distended  Ext: no LE edema, dose have deformity of RUE bicep, swelling consistent with tendon rupture  Neuro: Alert, oriented, follows commands. Moving all extremities spontaneously    LABS: (reviewed in Epic, pertinent values noted below)   PFTs: (personally reviewed and interpereted):    Date: FVC (% Pred) FEV1 (% Pred) FEF25-75(% Pred) DLCO TBBx/Results   02/25/23 4.08 (120%) 3.55 (137%) 87 (112%)     11/19/22  4.11 3.68      08/27/22 4.20 (123%) 3.67 (141%) 5.59 (276.9%)     05/28/22 4.16 (121.5%) 3.69 (141.4%) 3.23 (307.1%)     02/19/2022 4.03 (110.4) 3.52 (126.7) 5.19 (241.3)     11/17/21 4.13 (113%) 3.63 (130%)      08/18/21 4.01 (109%) 3.55 (127%)      05/15/2021 4.21(114%) 3.65 (130.3%) 5.54 (254%)     04/06/2021 4.08 (111%) 3.64 (129.6%) 6.01 (274.3%)     12/31/20 3.95 (107.1%) 3.46 (122.6%) 5.20 (236.4%)     12/03/2020 3.87 (104.9%) 3.47 (122.9%) 5.69 (258.3%)     10/03/2020 3.68 (99.4) 3.13 (110.6)  4.22 (190.6)      09/05/20  3.70  3.14      08/20/20 3.65 (98%) 3.15 (111%) 4.69 (211%)      08/01/20 3.64 (98.2%) 3.10 (109.3%) 4.41 (198.8%)     07/18/20 3.49 (94%) 2.98 (105%) 4.20 (189%)     07/04/20 3.43 (92.5%) 2.97 (104.7%) 4.26 (191.7%)     06/27/2020 3.13 84.5 2.70 95.4 3.51 157.8     06/20/20 3.39 (91.4%) 2.82 (99.2%) 3.34 (150.35)     06/13/20 3.10 (72.8%) 2.66 (82.5%) 3.34 (136.2%)      05/15/20      BOLT    03/11/20  2.75 (72%)  2.36 (81%)       02/12/20  2.85 (75%)  2.44 (86%)                IMAGING: Reviewed in EMR, CXR with broken sternal wires, otherwise stable    ASSESSMENT and PLAN     Mr. Garris Besch is a 72 y.o. man with  IPF and emphysema s/p BOLT on 05/15/2020, also with type 2 diabetes mellitus, hypertension, colon cancer in 2007 s/p partial colon and hepatic wedge resection and FOLFOX here for follow up.    Graft Function and Immunosuppression:  - PFTs and CXR are stable, feels well  - No DSAs, checked quarterly, last 06/2022, pending today  - Total IgG 731 (02/2023)  - Immunosuppression:     --> Continue tac goal 5-7. Currently taking 0.5mg  BID    --> AZA 50 mg daily (lower dose due to pancytopenia); no MMF due to GI upset    --> Prednisone 7.5mg  daily  - BOS PPX    --> Azithromycin 250mg  daily    --> ASA 81mg  daily    --> Continue atorvastatin 02/2022    --> Continue Advair BID    --> Montelukast 10mg  daily    Antimicrobial Prophylaxis:  - CMV, high risk (D+/R-): valganciclovir 450mg  daily indefinitely for Estimated Creatinine Clearance: 46.4 mL/min (A) (based on SCr of 1.37 mg/dL (H)).   Negative VL 12/29/2021. T cell assay with low immunity 09/2022, repeat in 1 year  - EBV (D+/R+): negative 12/29/21  - Fungal: Posaconazole stopped at 87mo  - PCP: Bactrim SS MWF, had been discontinued, restarted today 02/25/23    Biceps tendon tear:  Complete tear of the long head of the biceps tendon. Full-thickness tear of the anterior fibers of the supraspinatus tendon.  Osteoarthrosis of the glenohumeral joint with associated degenerative tear of the superior glenoid labrum.    - follows with ortho  - deferring surgery    Diarrhea:   - will decrease magnesium as below   - discussed switching to Envarsus, but deferring at this time due to cost. Will see how he does off the mag     Hypertension  CKD IIIa   - sCr stable   - start amlodipine 5mg  daily   - start checking daily Blood pressures  - will followup 3 months    OSA   - STOP-BANG 4 points (male, age, observed apnea, snoring).  - PSG 12/29/2020   CPAP at level of 10 cm H2O with expiratory pressure relief (EPR) = 3 and with heated humidifier for nasal dryness, mask: AirFit N20 size medium or mask of patient???s preference.  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  - pH probe with DeMeester 16.7%, slightly in abnormal range but will hold off on surgical intervention at this time in the setting of normal graft function.  - Daily PPI   - no reflux symptoms, no difficulty swallowing    BPH - terazosin 3mg  at bedtime, continues with nocturia  - UA completed at prior appt, benign.  - Normal CEA and PSA  - Follows with urology    Iron Deficiency Anemia   - receives periodic iron infusions    Hypomagnesemia   - magnesium oxide 1200 mg BID, decrease dose today to 800mg  nightly    Type 2 diabetes mellitus   - metformin 1000mg  BID  - Januvia  - PVL ABI and carotid duplex normal 11/2021  - fasting BG 110-120  - Last A1c (11/19/22) 5.9     Bone Health:   - Last Dexa:  low bone mass on DEXA 11/2022, +1.5% at hip, +2.1% femoral neck  - on Ca and Vit D supplementation currently  - alendronate    Health Maintenance:  - Last derm visit:  12/2021  - Last Colonsocopy (if >40): 12/2021  - Last flu shot:   02/19/2022; today 02/25/2023  - RSV vaccine:   Completed locally 06/2022  - Last Pneumovax:  06/10/2004, 12/30/2017  - Last Prevnar:  03/25/2016  - TdaP:   02/11/2018  - Shingrix:    03/10/20, 08/20/20  - COVID-19 (Moderna) 06/11/19, 07/09/19, 03/10/20, 07/18/2020, 12/03/20 received 1/2 dose Evushield,03/2021 (Bivalent); SpikeVax 02/19/2022 and 08/27/22. Today 02/25/23  - Last Dexa:   02/2020, 11/17/21 -> T-score -2.2 FRAX score 8%, July 2024 FRAX   - Last vitamin D:  66.9 05/2021  - Last HbA1c:   6.2 (02/2022)    Immunization History   Administered Date(s) Administered    COVID-19 VAC,BIVALENT(42YR UP),PFIZER 08/18/2021    COVID-19 VACCINE,MRNA(MODERNA)(PF) 06/11/2019, 07/09/2019, 03/10/2020, 07/18/2020, 12/03/2020    Covid-19 Vac, (59yr+) (Spikevax) Monovalent Moderna 02/19/2022, 08/27/2022    HEPATITIS B VACCINE ADULT, ADJUVANTED, IM(HEPLISAV B) 03/12/2020    INFLUENZA INJ MDCK PF, QUAD,(FLUCELVAX)(62MO AND UP EGG FREE) 02/08/2020    INFLUENZA QUAD ADJUVANTED 16YR UP(FLUAD) 05/28/2022    INFLUENZA TIV (TRI) PF (IM)(HISTORICAL) 02/27/2010    Influenza Vaccine Quad(IM)6 MO-Adult(PF) 01/19/2019, 04/06/2021, 02/19/2022    Influenza Virus Vaccine, unspecified formulation 02/07/2013, 02/28/2015, 02/05/2016, 02/02/2017, 01/27/2018    PNEUMOCOCCAL POLYSACCHARIDE 23-VALENT 06/10/2004, 12/30/2017    Pneumococcal Conjugate 13-Valent 03/25/2016    RSV VACCINE,ADJUVANTED(PF)(28YRS+)(AREXVY) 06/05/2022    SHINGRIX-ZOSTER VACCINE (HZV),RECOMBINANT,ADJUVANTED(IM) 03/10/2020, 08/20/2020    TdaP 02/15/2012, 02/11/2018       >22min was spent with the patient face to face and >86min was spent reviewing chart/imaging.     Complex medical decision making was done as we adjust medications based on blood work/drug levels and multiple complaints and problems were addressed. Patient was seen and discussed by Dr. Dudley Major.       Alesia Banda, MD  Pulmonary Critical Care Fellow

## 2023-03-02 MED ORDER — ON CALL EXPRESS TEST STRIP
11 refills | 0 days | Status: CP
Start: 2023-03-02 — End: ?
  Filled 2023-03-10: qty 100, 20d supply, fill #0

## 2023-03-04 DIAGNOSIS — E119 Type 2 diabetes mellitus without complications: Principal | ICD-10-CM

## 2023-03-04 LAB — FSAB CLASS 2 ANTIBODY SPECIFICITY: HLA CL2 AB RESULT: NEGATIVE

## 2023-03-04 LAB — HLA DS POST TRANSPLANT
ANTI-DONOR DRW #1 MFI: 0 MFI
ANTI-DONOR DRW #2 MFI: 0 MFI
ANTI-DONOR HLA-A #1 MFI: 0 MFI
ANTI-DONOR HLA-A #2 MFI: 0 MFI
ANTI-DONOR HLA-B #1 MFI: 0 MFI
ANTI-DONOR HLA-B #2 MFI: 0 MFI
ANTI-DONOR HLA-C #2 MFI: 0 MFI
ANTI-DONOR HLA-DQB #1 MFI: 0 MFI
ANTI-DONOR HLA-DQB #2 MFI: 0 MFI
ANTI-DONOR HLA-DR #1 MFI: 0 MFI
ANTI-DONOR HLA-DR #2 MFI: 0 MFI

## 2023-03-04 LAB — FSAB CLASS 1 ANTIBODY SPECIFICITY: HLA CLASS 1 ANTIBODY RESULT: NEGATIVE

## 2023-03-07 DIAGNOSIS — E119 Type 2 diabetes mellitus without complications: Principal | ICD-10-CM

## 2023-03-07 MED ORDER — BLOOD-GLUCOSE METER KIT WRAPPER
0 refills | 0 days | Status: CP
Start: 2023-03-07 — End: 2024-03-06
  Filled 2023-04-26: qty 1, 30d supply, fill #0

## 2023-03-09 MED ORDER — CALCIUM 600 MG (AS CALCIUM CARBONATE 1,500 MG) TABLET
ORAL_TABLET | Freq: Two times a day (BID) | ORAL | 11 refills | 30 days | Status: CP
Start: 2023-03-09 — End: 2023-03-09
  Filled 2023-03-10: qty 60, 30d supply, fill #0

## 2023-03-09 MED ORDER — ATORVASTATIN 40 MG TABLET
ORAL_TABLET | Freq: Every day | ORAL | 3 refills | 90 days | Status: CN
Start: 2023-03-09 — End: 2024-03-08

## 2023-03-09 NOTE — Unmapped (Signed)
 Pt request for RX Refill

## 2023-03-10 ENCOUNTER — Institutional Professional Consult (permissible substitution): Admit: 2023-03-10 | Discharge: 2023-03-11 | Payer: MEDICARE

## 2023-03-10 MED FILL — ATORVASTATIN 40 MG TABLET: ORAL | 90 days supply | Qty: 90 | Fill #0

## 2023-03-10 NOTE — Unmapped (Signed)
Outgoing call to [employer] Sr., tested positive for rhinovirus. Discussed symptom management and to call back if not improving.  Questions answered, verbalized understanding.

## 2023-03-10 NOTE — Unmapped (Signed)
Patient presented to clinic for nasal swab for nasal congestion.  Swab collected.

## 2023-03-11 ENCOUNTER — Ambulatory Visit: Admit: 2023-03-11 | Discharge: 2023-03-12 | Payer: MEDICARE

## 2023-03-11 DIAGNOSIS — Z942 Lung transplant status: Principal | ICD-10-CM

## 2023-03-11 LAB — COMPREHENSIVE METABOLIC PANEL
ALBUMIN: 3.8 g/dL (ref 3.4–5.0)
ALKALINE PHOSPHATASE: 70 U/L (ref 46–116)
ALT (SGPT): 17 U/L (ref 10–49)
ANION GAP: 7 mmol/L (ref 5–14)
AST (SGOT): 22 U/L (ref <=34)
BILIRUBIN TOTAL: 0.5 mg/dL (ref 0.3–1.2)
BLOOD UREA NITROGEN: 31 mg/dL — ABNORMAL HIGH (ref 9–23)
BUN / CREAT RATIO: 21
CALCIUM: 9.6 mg/dL (ref 8.7–10.4)
CHLORIDE: 108 mmol/L — ABNORMAL HIGH (ref 98–107)
CO2: 29.3 mmol/L (ref 20.0–31.0)
CREATININE: 1.45 mg/dL — ABNORMAL HIGH
EGFR CKD-EPI (2021) MALE: 51 mL/min/{1.73_m2} — ABNORMAL LOW (ref >=60)
GLUCOSE RANDOM: 124 mg/dL (ref 70–179)
POTASSIUM: 4.6 mmol/L (ref 3.4–4.8)
PROTEIN TOTAL: 6.7 g/dL (ref 5.7–8.2)
SODIUM: 144 mmol/L (ref 135–145)

## 2023-03-11 LAB — CBC W/ AUTO DIFF
BASOPHILS ABSOLUTE COUNT: 0 10*9/L (ref 0.0–0.1)
BASOPHILS RELATIVE PERCENT: 0.5 %
EOSINOPHILS ABSOLUTE COUNT: 0.1 10*9/L (ref 0.0–0.5)
EOSINOPHILS RELATIVE PERCENT: 2.2 %
HEMATOCRIT: 34.1 % — ABNORMAL LOW (ref 39.0–48.0)
HEMOGLOBIN: 10.9 g/dL — ABNORMAL LOW (ref 12.9–16.5)
LYMPHOCYTES ABSOLUTE COUNT: 0.7 10*9/L — ABNORMAL LOW (ref 1.1–3.6)
LYMPHOCYTES RELATIVE PERCENT: 14 %
MEAN CORPUSCULAR HEMOGLOBIN CONC: 32.1 g/dL (ref 32.0–36.0)
MEAN CORPUSCULAR HEMOGLOBIN: 26.4 pg (ref 25.9–32.4)
MEAN CORPUSCULAR VOLUME: 82.2 fL (ref 77.6–95.7)
MEAN PLATELET VOLUME: 7.2 fL (ref 6.8–10.7)
MONOCYTES ABSOLUTE COUNT: 0.4 10*9/L (ref 0.3–0.8)
MONOCYTES RELATIVE PERCENT: 8.9 %
NEUTROPHILS ABSOLUTE COUNT: 3.7 10*9/L (ref 1.8–7.8)
NEUTROPHILS RELATIVE PERCENT: 74.4 %
NUCLEATED RED BLOOD CELLS: 0 /100{WBCs} (ref <=4)
PLATELET COUNT: 163 10*9/L (ref 150–450)
RED BLOOD CELL COUNT: 4.15 10*12/L — ABNORMAL LOW (ref 4.26–5.60)
RED CELL DISTRIBUTION WIDTH: 17 % — ABNORMAL HIGH (ref 12.2–15.2)
WBC ADJUSTED: 4.9 10*9/L (ref 3.6–11.2)

## 2023-03-11 LAB — MAGNESIUM: MAGNESIUM: 1.9 mg/dL (ref 1.6–2.6)

## 2023-03-11 LAB — PHOSPHORUS: PHOSPHORUS: 2.8 mg/dL (ref 2.4–5.1)

## 2023-03-11 LAB — TACROLIMUS LEVEL, TROUGH: TACROLIMUS, TROUGH: 5.9 ng/mL (ref 5.0–15.0)

## 2023-03-11 LAB — IGG: GAMMAGLOBULIN; IGG: 749 mg/dL (ref 650–1600)

## 2023-03-11 MED ORDER — TACROLIMUS 0.5 MG CAPSULE, IMMEDIATE-RELEASE
ORAL_CAPSULE | Freq: Two times a day (BID) | ORAL | 3 refills | 90 days | Status: CP
Start: 2023-03-11 — End: 2024-03-10
  Filled 2023-03-17: qty 720, 90d supply, fill #0

## 2023-03-11 NOTE — Unmapped (Signed)
Called Spencer Mallet Sr. to discuss lab results. Tacrolimus level was normal at 5.9 with a goal of 5-7. Medication regimen is currently 2 mg BID. No changes made, level within normal range.  dosing at this time to remain the same. Repeat labs scheduled monthly. Resulted labs reviewed. All questions answered. Pt verbalized understanding.

## 2023-03-14 NOTE — Unmapped (Addendum)
[facility] Pharmacist has reviewed a new prescription for tacrolimus that indicates a dose  increase since last SHDP fill .  Patient was counseled and confirmed on this current dosing per coordinator CL per epic note on 11/1  Next refill call date adjusted if necessary.      Clinical Assessment Needed For: Dose Change  Medication: Tacrolimus 0.5mg  capsule  Last Fill Date/Day Supply: 01/13/2023 / 90 days  Copay $24  Was previous dose already scheduled to fill: No    Notes to Pharmacist: N/A

## 2023-03-15 NOTE — Unmapped (Signed)
[facility] Specialty and Home Delivery Pharmacy Refill Coordination Note    Specialty Medication(s) to be Shipped:   Transplant: tacrolimus 0.5 mg    Other medication(s) to be shipped: No additional medications requested for fill at this time     Spencer DEMAREST Sr., DOB: Aug 19, 1950  Phone: [phone] (home) [phone] (work)      All above HIPAA information was verified with patient.     Was a Nurse, learning disability used for this call? No    Completed refill call assessment today to schedule patient's medication shipment from the [facility] Delivery Pharm[facility]  [phone]).  All relevant notes have been reviewed.     Specialty medication(s) and dose(s) confirmed: Regimen is correct and unchanged.   Changes to medications: French reports no changes at this time.  Changes to insurance: No  New side effects reported not previously addressed with a pharmacist or physician: None reported  Questions for the pharmacist: No    Confirmed patient received a Conservation officer, historic buildings and a Surveyor, mining with first shipment. The patient will receive a drug information handout for each medication shipped and additional FDA Medication Guides as required.       DISEASE/MEDICATION-SPECIFIC INFORMATION        N/A    SPECIALTY MEDICATION ADHERENCE     Medication Adherence    Patient reported X missed doses in the last month: 0  Specialty Medication: tacrolimus 0.5 MG capsule  Patient is on additional specialty medications: No              Were doses missed due to medication being on hold? No    Tacrolimus 0.5  mg: 7 days of medicine on hand        REFERRAL TO PHARMACIST     Referral to the pharmacist: Not needed      [facility]     Shipping address confirmed in Epic.       Delivery Scheduled: Yes, Expected medication delivery date: 03/17/23.     Medication will be delivered via Same Day Courier to the prescription address in Epic [place].    [name]   [facility] Special[facility] and Home Delivery Pharmacy  Specialty Technician

## 2023-03-30 ENCOUNTER — Ambulatory Visit: Admit: 2023-03-30 | Discharge: 2023-03-31 | Payer: MEDICARE

## 2023-03-30 DIAGNOSIS — H527 Unspecified disorder of refraction: Principal | ICD-10-CM

## 2023-03-30 NOTE — Unmapped (Signed)
Refraction only visit.

## 2023-03-30 NOTE — Unmapped (Signed)
[facility] Specialty Pharmacy Refill Coordination Note    Specialty Medication(s) to be Shipped:   Transplant: valgancyclovir 450mg     Other medication(s) to be shipped: No additional medications requested for fill at this time     CAPERS ZIERDEN Sr., DOB: [birth year]  Phone: [phone] (home) [phone] (work)      All above HIPAA information was verified with patient.     Was a Nurse, learning disability used for this call? No    Completed refill call assessment today to schedule patient's medication shipment from the [facility] [phone]).  All relevant notes have been reviewed.     Specialty medication(s) and dose(s) confirmed: Regimen is correct and unchanged.   Changes to medications: Jeshua reports no changes at this time.  Changes to insurance: No  New side effects reported not previously addressed with a pharmacist or physician: None reported  Questions for the pharmacist: No    Confirmed patient received a Conservation officer, historic buildings and a Surveyor, mining with first shipment. The patient will receive a drug information handout for each medication shipped and additional FDA Medication Guides as required.       DISEASE/MEDICATION-SPECIFIC INFORMATION        N/A    SPECIALTY MEDICATION ADHERENCE     Medication Adherence    Patient reported X missed doses in the last month: 0  Specialty Medication: valGANciclovir 450 mg tablet (VALCYTE)  Patient is on additional specialty medications: No              Were doses missed due to medication being on hold? No    Valganciclovir 450 mg: 1 days of medicine on hand        REFERRAL TO PHARMACIST     Referral to the pharmacist: Not needed      [facility]     Shipping address confirmed in Epic.       Delivery Scheduled: Yes, Expected medication delivery date: 03/31/23.     Medication will be delivered via Same Day Courier to the prescription address in Epic [place].    Gaspar Cola [facility] Specialty Technician

## 2023-03-31 MED FILL — VALGANCICLOVIR 450 MG TABLET: ORAL | 30 days supply | Qty: 30 | Fill #8

## 2023-04-11 MED FILL — AMLODIPINE 5 MG TABLET: ORAL | 30 days supply | Qty: 30 | Fill #0

## 2023-04-11 MED FILL — CHOLECALCIFEROL (VITAMIN D3) 25 MCG (1,000 UNIT) TABLET: ORAL | 100 days supply | Qty: 100 | Fill #1

## 2023-04-11 MED FILL — CALCIUM 600 MG (AS CALCIUM CARBONATE 1,500 MG) TABLET: ORAL | 30 days supply | Qty: 60 | Fill #1

## 2023-04-15 ENCOUNTER — Ambulatory Visit: Admit: 2023-04-15 | Discharge: 2023-04-16 | Payer: MEDICARE

## 2023-04-15 DIAGNOSIS — Z942 Lung transplant status: Principal | ICD-10-CM

## 2023-04-15 LAB — COMPREHENSIVE METABOLIC PANEL
ALBUMIN: 3.8 g/dL (ref 3.4–5.0)
ALKALINE PHOSPHATASE: 77 U/L (ref 46–116)
ALT (SGPT): 19 U/L (ref 10–49)
ANION GAP: 13 mmol/L (ref 5–14)
AST (SGOT): 22 U/L (ref <=34)
BILIRUBIN TOTAL: 0.4 mg/dL (ref 0.3–1.2)
BLOOD UREA NITROGEN: 29 mg/dL — ABNORMAL HIGH (ref 9–23)
BUN / CREAT RATIO: 23
CALCIUM: 9.4 mg/dL (ref 8.7–10.4)
CHLORIDE: 102 mmol/L (ref 98–107)
CO2: 29.9 mmol/L (ref 20.0–31.0)
CREATININE: 1.28 mg/dL — ABNORMAL HIGH (ref 0.73–1.18)
EGFR CKD-EPI (2021) MALE: 59 mL/min/{1.73_m2} — ABNORMAL LOW (ref >=60)
GLUCOSE RANDOM: 135 mg/dL (ref 70–179)
POTASSIUM: 4.3 mmol/L (ref 3.4–4.8)
PROTEIN TOTAL: 6.7 g/dL (ref 5.7–8.2)
SODIUM: 145 mmol/L (ref 135–145)

## 2023-04-15 LAB — IGG: GAMMAGLOBULIN; IGG: 744 mg/dL (ref 650–1600)

## 2023-04-15 LAB — CBC W/ AUTO DIFF
BASOPHILS ABSOLUTE COUNT: 0 10*9/L (ref 0.0–0.1)
BASOPHILS RELATIVE PERCENT: 0.6 %
EOSINOPHILS ABSOLUTE COUNT: 0.1 10*9/L (ref 0.0–0.5)
EOSINOPHILS RELATIVE PERCENT: 1.8 %
HEMATOCRIT: 33.5 % — ABNORMAL LOW (ref 39.0–48.0)
HEMOGLOBIN: 10.7 g/dL — ABNORMAL LOW (ref 12.9–16.5)
LYMPHOCYTES ABSOLUTE COUNT: 0.7 10*9/L — ABNORMAL LOW (ref 1.1–3.6)
LYMPHOCYTES RELATIVE PERCENT: 15.1 %
MEAN CORPUSCULAR HEMOGLOBIN CONC: 32.1 g/dL (ref 32.0–36.0)
MEAN CORPUSCULAR HEMOGLOBIN: 25.8 pg — ABNORMAL LOW (ref 25.9–32.4)
MEAN CORPUSCULAR VOLUME: 80.3 fL (ref 77.6–95.7)
MEAN PLATELET VOLUME: 7 fL (ref 6.8–10.7)
MONOCYTES ABSOLUTE COUNT: 0.4 10*9/L (ref 0.3–0.8)
MONOCYTES RELATIVE PERCENT: 8.8 %
NEUTROPHILS ABSOLUTE COUNT: 3.5 10*9/L (ref 1.8–7.8)
NEUTROPHILS RELATIVE PERCENT: 73.7 %
NUCLEATED RED BLOOD CELLS: 0 /100{WBCs} (ref <=4)
PLATELET COUNT: 158 10*9/L (ref 150–450)
RED BLOOD CELL COUNT: 4.17 10*12/L — ABNORMAL LOW (ref 4.26–5.60)
RED CELL DISTRIBUTION WIDTH: 17.6 % — ABNORMAL HIGH (ref 12.2–15.2)
WBC ADJUSTED: 4.7 10*9/L (ref 3.6–11.2)

## 2023-04-15 LAB — TACROLIMUS LEVEL, TROUGH: TACROLIMUS, TROUGH: 8.1 ng/mL (ref 5.0–15.0)

## 2023-04-15 LAB — MAGNESIUM: MAGNESIUM: 1.6 mg/dL (ref 1.6–2.6)

## 2023-04-15 LAB — PHOSPHORUS: PHOSPHORUS: 2.7 mg/dL (ref 2.4–5.1)

## 2023-04-15 NOTE — Unmapped (Signed)
Called Spencer Mallet Sr. to discuss lab results. Tacrolimus level was increased at 8.1 with a goal of 5-7. Medication regimen is currently 2 mg BID. Discussed with [name], PharmD, dosing at this time to remain the same. Repeat labs scheduled two weeks. Resulted labs reviewed. All questions answered. Pt verbalized understanding.

## 2023-04-20 MED ORDER — ACETAMINOPHEN 500 MG TABLET
ORAL_TABLET | Freq: Four times a day (QID) | ORAL | PRN refills | 23.00 days | Status: CP | PRN
Start: 2023-04-20 — End: ?
  Filled 2023-04-26: qty 180, 23d supply, fill #0

## 2023-04-20 NOTE — Unmapped (Signed)
[facility] Specialty and Home Delivery Pharmacy Refill Coordination Note    Specialty Medication(s) to be Shipped:   Transplant: valgancyclovir 450mg     Other medication(s) to be shipped:  Spencer Navy Sr., DOB: 05/08/1951  Phone: [phone] (home) [phone] (work)      All above HIPAA information was verified with patient.     Was a Nurse, learning disability used for this call? No    Completed refill call assessment today to schedule patient's medication shipment from the [facility] and Home Delivery Pharmacy  [phone]).  All relevant notes have been reviewed.     Specialty medication(s) and dose(s) confirmed: Regimen is correct and unchanged.   Changes to medications: Mekiah reports no changes at this time.  Changes to insurance: No  New side effects reported not previously addressed with a pharmacist or physician: None reported  Questions for the pharmacist: No    Confirmed patient received a Conservation officer, historic buildings and a Surveyor, mining with first shipment. The patient will receive a drug information handout for each medication shipped and additional FDA Medication Guides as required.       DISEASE/MEDICATION-SPECIFIC INFORMATION        N/A    SPECIALTY MEDICATION ADHERENCE              Were doses missed due to medication being on hold? No      valGANciclovir 450 mg tablet (VALCYTE): 8 days of medicine on hand       REFERRAL TO PHARMACIST     Referral to the pharmacist: Not needed      [facility]     Shipping address confirmed in Epic.       Delivery Scheduled: Yes, Expected medication delivery date: 04/26/2023.     Medication will be delivered via Same Day Courier to the prescription address in Epic WAM.    [name] Pharm[facility]  Specialty Technician

## 2023-04-20 NOTE — Unmapped (Signed)
 Pt request for RX Refill

## 2023-04-26 MED FILL — VALGANCICLOVIR 450 MG TABLET: ORAL | 30 days supply | Qty: 30 | Fill #9

## 2023-04-26 MED FILL — JANUVIA 100 MG TABLET: ORAL | 90 days supply | Qty: 90 | Fill #2

## 2023-04-27 MED ORDER — ON CALL EXPRESS TEST STRIP
11 refills | 0.00 days | Status: CP
Start: 2023-04-27 — End: ?
  Filled 2023-04-28: qty 100, 25d supply, fill #0

## 2023-04-28 MED FILL — MONTELUKAST 10 MG TABLET: ORAL | 90 days supply | Qty: 90 | Fill #3

## 2023-04-28 MED FILL — AZATHIOPRINE 50 MG TABLET: ORAL | 90 days supply | Qty: 90 | Fill #2

## 2023-04-28 MED FILL — METFORMIN 1,000 MG TABLET: ORAL | 90 days supply | Qty: 180 | Fill #2

## 2023-04-29 ENCOUNTER — Ambulatory Visit: Admit: 2023-04-29 | Discharge: 2023-04-30 | Payer: MEDICARE

## 2023-04-29 DIAGNOSIS — Z942 Lung transplant status: Principal | ICD-10-CM

## 2023-04-29 LAB — CBC W/ AUTO DIFF
BASOPHILS ABSOLUTE COUNT: 0 10*9/L (ref 0.0–0.1)
BASOPHILS RELATIVE PERCENT: 0.5 %
EOSINOPHILS ABSOLUTE COUNT: 0.1 10*9/L (ref 0.0–0.5)
EOSINOPHILS RELATIVE PERCENT: 1.9 %
HEMATOCRIT: 33 % — ABNORMAL LOW (ref 39.0–48.0)
HEMOGLOBIN: 10.6 g/dL — ABNORMAL LOW (ref 12.9–16.5)
LYMPHOCYTES ABSOLUTE COUNT: 0.7 10*9/L — ABNORMAL LOW (ref 1.1–3.6)
LYMPHOCYTES RELATIVE PERCENT: 16.6 %
MEAN CORPUSCULAR HEMOGLOBIN CONC: 32.1 g/dL (ref 32.0–36.0)
MEAN CORPUSCULAR HEMOGLOBIN: 25.6 pg — ABNORMAL LOW (ref 25.9–32.4)
MEAN CORPUSCULAR VOLUME: 79.7 fL (ref 77.6–95.7)
MEAN PLATELET VOLUME: 7.2 fL (ref 6.8–10.7)
MONOCYTES ABSOLUTE COUNT: 0.4 10*9/L (ref 0.3–0.8)
MONOCYTES RELATIVE PERCENT: 9.1 %
NEUTROPHILS ABSOLUTE COUNT: 3.2 10*9/L (ref 1.8–7.8)
NEUTROPHILS RELATIVE PERCENT: 71.9 %
NUCLEATED RED BLOOD CELLS: 0 /100{WBCs} (ref <=4)
PLATELET COUNT: 164 10*9/L (ref 150–450)
RED BLOOD CELL COUNT: 4.14 10*12/L — ABNORMAL LOW (ref 4.26–5.60)
RED CELL DISTRIBUTION WIDTH: 17.9 % — ABNORMAL HIGH (ref 12.2–15.2)
WBC ADJUSTED: 4.4 10*9/L (ref 3.6–11.2)

## 2023-04-29 LAB — MAGNESIUM: MAGNESIUM: 1.6 mg/dL (ref 1.6–2.6)

## 2023-04-29 LAB — COMPREHENSIVE METABOLIC PANEL
ALBUMIN: 3.8 g/dL (ref 3.4–5.0)
ALKALINE PHOSPHATASE: 77 U/L (ref 46–116)
ALT (SGPT): 19 U/L (ref 10–49)
ANION GAP: 11 mmol/L (ref 5–14)
AST (SGOT): 22 U/L (ref <=34)
BILIRUBIN TOTAL: 0.4 mg/dL (ref 0.3–1.2)
BLOOD UREA NITROGEN: 28 mg/dL — ABNORMAL HIGH (ref 9–23)
BUN / CREAT RATIO: 21
CALCIUM: 9.6 mg/dL (ref 8.7–10.4)
CHLORIDE: 103 mmol/L (ref 98–107)
CO2: 30.2 mmol/L (ref 20.0–31.0)
CREATININE: 1.31 mg/dL — ABNORMAL HIGH (ref 0.73–1.18)
EGFR CKD-EPI (2021) MALE: 58 mL/min/{1.73_m2} — ABNORMAL LOW (ref >=60)
GLUCOSE RANDOM: 130 mg/dL — ABNORMAL HIGH (ref 70–99)
POTASSIUM: 4 mmol/L (ref 3.4–4.8)
PROTEIN TOTAL: 6.6 g/dL (ref 5.7–8.2)
SODIUM: 144 mmol/L (ref 135–145)

## 2023-04-29 LAB — IGG: GAMMAGLOBULIN; IGG: 737 mg/dL (ref 650–1600)

## 2023-04-29 LAB — PHOSPHORUS: PHOSPHORUS: 2.8 mg/dL (ref 2.4–5.1)

## 2023-04-29 LAB — TACROLIMUS LEVEL: TACROLIMUS BLOOD: 8.2 ng/mL

## 2023-04-29 MED ORDER — TACROLIMUS 0.5 MG CAPSULE, IMMEDIATE-RELEASE
ORAL_CAPSULE | ORAL | 3 refills | 90.00 days | Status: CP
Start: 2023-04-29 — End: 2024-04-28

## 2023-04-29 NOTE — Unmapped (Signed)
Called Spencer Mallet Sr. to discuss lab results. Tacrolimus level was increased at 8.2 with a goal of 5-7. Medication regimen is currently 2 mg BID. Discussed with [name], CPP dosing at this time decreased to  2/1. Repeat labs scheduled two weeks. Resulted labs reviewed. All questions answered. Pt verbalized understanding.

## 2023-05-02 NOTE — Unmapped (Addendum)
[facility] Pharmacist has reviewed a new prescription for tacrolimus that indicates a dose decrease.  Patient was counseled on this dosage change by CL- see epic note from 04/29/23.  Next refill call date adjusted if necessary.        Clinical Assessment Needed For: Dose Change  Medication: Tacrolimus 0.5mg  capsule  Last Fill Date/Day Supply: 03/17/2023 / 90 days  Refill Too Soon until 05/30/2023  Was previous dose already scheduled to fill: No    Notes to Pharmacist: Will re-test on 01/20

## 2023-05-12 MED FILL — CALCIUM 600 MG (AS CALCIUM CARBONATE 1,500 MG) TABLET: ORAL | 30 days supply | Qty: 60 | Fill #2

## 2023-05-12 MED FILL — AMLODIPINE 5 MG TABLET: ORAL | 30 days supply | Qty: 30 | Fill #1

## 2023-05-12 MED FILL — ALENDRONATE 70 MG TABLET: ORAL | 84 days supply | Qty: 12 | Fill #2

## 2023-05-12 MED FILL — AZITHROMYCIN 250 MG TABLET: ORAL | 90 days supply | Qty: 90 | Fill #1

## 2023-05-13 ENCOUNTER — Ambulatory Visit: Admit: 2023-05-13 | Discharge: 2023-05-14 | Payer: MEDICARE

## 2023-05-13 LAB — IGG: GAMMAGLOBULIN; IGG: 778 mg/dL (ref 650–1600)

## 2023-05-13 LAB — CBC W/ AUTO DIFF
BASOPHILS ABSOLUTE COUNT: 0 10*9/L (ref 0.0–0.1)
BASOPHILS RELATIVE PERCENT: 0.6 %
EOSINOPHILS ABSOLUTE COUNT: 0.1 10*9/L (ref 0.0–0.5)
EOSINOPHILS RELATIVE PERCENT: 1.9 %
HEMATOCRIT: 35 % — ABNORMAL LOW (ref 39.0–48.0)
HEMOGLOBIN: 11.5 g/dL — ABNORMAL LOW (ref 12.9–16.5)
LYMPHOCYTES ABSOLUTE COUNT: 0.9 10*9/L — ABNORMAL LOW (ref 1.1–3.6)
LYMPHOCYTES RELATIVE PERCENT: 15.2 %
MEAN CORPUSCULAR HEMOGLOBIN CONC: 33 g/dL (ref 32.0–36.0)
MEAN CORPUSCULAR HEMOGLOBIN: 26.2 pg (ref 25.9–32.4)
MEAN CORPUSCULAR VOLUME: 79.6 fL (ref 77.6–95.7)
MEAN PLATELET VOLUME: 7.4 fL (ref 6.8–10.7)
MONOCYTES ABSOLUTE COUNT: 0.5 10*9/L (ref 0.3–0.8)
MONOCYTES RELATIVE PERCENT: 8.9 %
NEUTROPHILS ABSOLUTE COUNT: 4.2 10*9/L (ref 1.8–7.8)
NEUTROPHILS RELATIVE PERCENT: 73.4 %
PLATELET COUNT: 195 10*9/L (ref 150–450)
RED BLOOD CELL COUNT: 4.4 10*12/L (ref 4.26–5.60)
RED CELL DISTRIBUTION WIDTH: 17.7 % — ABNORMAL HIGH (ref 12.2–15.2)
WBC ADJUSTED: 5.7 10*9/L (ref 3.6–11.2)

## 2023-05-13 LAB — COMPREHENSIVE METABOLIC PANEL
ALBUMIN: 4.3 g/dL (ref 3.4–5.0)
ALKALINE PHOSPHATASE: 84 U/L (ref 46–116)
ALT (SGPT): 20 U/L (ref 10–49)
ANION GAP: 10 mmol/L (ref 5–14)
AST (SGOT): 23 U/L (ref <=34)
BILIRUBIN TOTAL: 0.5 mg/dL (ref 0.3–1.2)
BLOOD UREA NITROGEN: 34 mg/dL — ABNORMAL HIGH (ref 9–23)
BUN / CREAT RATIO: 20
CALCIUM: 10.2 mg/dL (ref 8.7–10.4)
CHLORIDE: 100 mmol/L (ref 98–107)
CO2: 30 mmol/L (ref 20.0–31.0)
CREATININE: 1.69 mg/dL — ABNORMAL HIGH (ref 0.73–1.18)
EGFR CKD-EPI (2021) MALE: 43 mL/min/{1.73_m2} — ABNORMAL LOW (ref >=60)
GLUCOSE RANDOM: 137 mg/dL — ABNORMAL HIGH (ref 70–99)
POTASSIUM: 4 mmol/L (ref 3.4–4.8)
PROTEIN TOTAL: 7.2 g/dL (ref 5.7–8.2)
SODIUM: 140 mmol/L (ref 135–145)

## 2023-05-13 LAB — PHOSPHORUS: PHOSPHORUS: 2.7 mg/dL (ref 2.4–5.1)

## 2023-05-13 LAB — TACROLIMUS LEVEL, TROUGH: TACROLIMUS, TROUGH: 8.2 ng/mL (ref 5.0–15.0)

## 2023-05-13 LAB — MAGNESIUM: MAGNESIUM: 1.8 mg/dL (ref 1.6–2.6)

## 2023-05-13 NOTE — Unmapped (Signed)
Called [name] to discuss lab results. Tacrolimus level was increased at 8.2 with a goal of 5-7. Medication regimen is currently 2/1. Discussed with [name], CPP dosing at this time  switched to 1/2  . Repeat labs scheduled one week. Resulted labs reviewed. All questions answered. Pt verbalized understanding.

## 2023-05-16 DIAGNOSIS — Z942 Lung transplant status: Principal | ICD-10-CM

## 2023-05-16 DIAGNOSIS — Z79899 Other long term (current) drug therapy: Principal | ICD-10-CM

## 2023-05-20 NOTE — Unmapped (Signed)
[facility] Specialty and Home Delivery Pharmacy Refill Coordination Note    Specialty Medication(s) to be Shipped:   Transplant: valgancyclovir 450mg     Other medication(s) to be shipped:  magnesium     Spencer Pena Sr., DOB: 07/25/1950  Phone: [phone] (home) [phone] (work)      All above HIPAA information was verified with patient.     Was a Nurse, learning disability used for this call? No    Completed refill call assessment today to schedule patient's medication shipment from the [facility] and Home Delivery Pharmacy  [phone]).  All relevant notes have been reviewed.     Specialty medication(s) and dose(s) confirmed: Regimen is correct and unchanged.   Changes to medications: Emmanuelle reports no changes at this time.  Changes to insurance: No  New side effects reported not previously addressed with a pharmacist or physician: None reported  Questions for the pharmacist: No    Confirmed patient received a Conservation officer, historic buildings and a Surveyor, mining with first shipment. The patient will receive a drug information handout for each medication shipped and additional FDA Medication Guides as required.       DISEASE/MEDICATION-SPECIFIC INFORMATION        N/A    SPECIALTY MEDICATION ADHERENCE     Medication Adherence    Patient reported X missed doses in the last month: 0  Specialty Medication: valGANciclovir 450 mg tablet (VALCYTE)  Patient is on additional specialty medications: No              Were doses missed due to medication being on hold? No    valganciclovir 450 mg: 5 days of medicine on hand       REFERRAL TO PHARMACIST     Referral to the pharmacist: Not needed      [facility]     Shipping address confirmed in Epic.       Delivery Scheduled: Yes, Expected medication delivery date: 05/24/2023.     Medication will be delivered via Next Day Courier to the prescription address in Epic WAM.    Spencer Pena   [facility] Special[facility] and Home Delivery Pharm[facility]  Specialty Technician

## 2023-05-23 MED FILL — MAGNESIUM OXIDE 400 MG (241.3 MG MAGNESIUM) TABLET: ORAL | 30 days supply | Qty: 120 | Fill #1

## 2023-05-23 MED FILL — VALGANCICLOVIR 450 MG TABLET: ORAL | 30 days supply | Qty: 30 | Fill #10

## 2023-05-27 ENCOUNTER — Ambulatory Visit: Admit: 2023-05-27 | Discharge: 2023-05-27 | Payer: MEDICARE

## 2023-05-27 ENCOUNTER — Inpatient Hospital Stay: Admit: 2023-05-27 | Discharge: 2023-05-27 | Payer: MEDICARE

## 2023-05-27 ENCOUNTER — Ambulatory Visit: Admit: 2023-05-27 | Discharge: 2023-05-27 | Payer: MEDICARE | Attending: Internal Medicine | Primary: Internal Medicine

## 2023-05-27 DIAGNOSIS — Z942 Lung transplant status: Principal | ICD-10-CM

## 2023-05-27 LAB — COMPREHENSIVE METABOLIC PANEL
ALBUMIN: 4.3 g/dL (ref 3.4–5.0)
ALKALINE PHOSPHATASE: 78 U/L (ref 46–116)
ALT (SGPT): 21 U/L (ref 10–49)
ANION GAP: 8 mmol/L (ref 5–14)
AST (SGOT): 23 U/L (ref <=34)
BILIRUBIN TOTAL: 0.5 mg/dL (ref 0.3–1.2)
BLOOD UREA NITROGEN: 23 mg/dL (ref 9–23)
BUN / CREAT RATIO: 17
CALCIUM: 9.7 mg/dL (ref 8.7–10.4)
CHLORIDE: 103 mmol/L (ref 98–107)
CO2: 30 mmol/L (ref 20.0–31.0)
CREATININE: 1.35 mg/dL — ABNORMAL HIGH (ref 0.73–1.18)
EGFR CKD-EPI (2021) MALE: 56 mL/min/{1.73_m2} — ABNORMAL LOW (ref >=60)
GLUCOSE RANDOM: 143 mg/dL — ABNORMAL HIGH (ref 70–99)
POTASSIUM: 3.8 mmol/L (ref 3.4–4.8)
PROTEIN TOTAL: 7 g/dL (ref 5.7–8.2)
SODIUM: 141 mmol/L (ref 135–145)

## 2023-05-27 LAB — PHOSPHORUS: PHOSPHORUS: 2.7 mg/dL (ref 2.4–5.1)

## 2023-05-27 LAB — CBC W/ AUTO DIFF
BASOPHILS ABSOLUTE COUNT: 0 10*9/L (ref 0.0–0.1)
BASOPHILS RELATIVE PERCENT: 0.7 %
EOSINOPHILS ABSOLUTE COUNT: 0.1 10*9/L (ref 0.0–0.5)
EOSINOPHILS RELATIVE PERCENT: 2 %
HEMATOCRIT: 36.1 % — ABNORMAL LOW (ref 39.0–48.0)
HEMOGLOBIN: 11.7 g/dL — ABNORMAL LOW (ref 12.9–16.5)
LYMPHOCYTES ABSOLUTE COUNT: 0.6 10*9/L — ABNORMAL LOW (ref 1.1–3.6)
LYMPHOCYTES RELATIVE PERCENT: 14.8 %
MEAN CORPUSCULAR HEMOGLOBIN CONC: 32.4 g/dL (ref 32.0–36.0)
MEAN CORPUSCULAR HEMOGLOBIN: 26.2 pg (ref 25.9–32.4)
MEAN CORPUSCULAR VOLUME: 80.8 fL (ref 77.6–95.7)
MEAN PLATELET VOLUME: 7.4 fL (ref 6.8–10.7)
MONOCYTES ABSOLUTE COUNT: 0.4 10*9/L (ref 0.3–0.8)
MONOCYTES RELATIVE PERCENT: 9.3 %
NEUTROPHILS ABSOLUTE COUNT: 3.1 10*9/L (ref 1.8–7.8)
NEUTROPHILS RELATIVE PERCENT: 73.2 %
PLATELET COUNT: 175 10*9/L (ref 150–450)
RED BLOOD CELL COUNT: 4.47 10*12/L (ref 4.26–5.60)
RED CELL DISTRIBUTION WIDTH: 18 % — ABNORMAL HIGH (ref 12.2–15.2)
WBC ADJUSTED: 4.2 10*9/L (ref 3.6–11.2)

## 2023-05-27 LAB — CMV DNA, QUANTITATIVE, PCR
CMV QUANT: <35 [IU]/mL — ABNORMAL HIGH (ref <0)
CMV VIRAL LD: DETECTED — AB

## 2023-05-27 LAB — EBV QUANTITATIVE PCR, BLOOD: EBV VIRAL LOAD RESULT: NOT DETECTED

## 2023-05-27 LAB — MAGNESIUM: MAGNESIUM: 1.7 mg/dL (ref 1.6–2.6)

## 2023-05-27 LAB — TACROLIMUS LEVEL, TROUGH: TACROLIMUS, TROUGH: 7.3 ng/mL (ref 5.0–15.0)

## 2023-05-27 LAB — IGG: GAMMAGLOBULIN; IGG: 727 mg/dL (ref 650–1600)

## 2023-05-27 MED ORDER — TACROLIMUS 0.5 MG CAPSULE, IMMEDIATE-RELEASE
ORAL_CAPSULE | ORAL | 3 refills | 90.00 days | Status: CP
Start: 2023-05-27 — End: 2024-05-26

## 2023-05-27 MED ORDER — MAGNESIUM OXIDE 400 MG (241.3 MG MAGNESIUM) TABLET
ORAL_TABLET | Freq: Every evening | ORAL | 3 refills | 90.00 days | Status: CP
Start: 2023-05-27 — End: 2024-05-26

## 2023-05-27 NOTE — Unmapped (Unsigned)
Pulmonary Transplant Clinic    HISTORY:   HPI:  Mr. Spencer Pena is a 73 y.o.  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     --> basiliximab induction    --> CMV high risk (D+/R-)    --> EBV moderate risk (D+/R+)    --> Not Increased Risk Donor  - Colon Cancer 2007    --> s/p resection of liver mets    --> s/p resection of 6 inches of colon    --> FOLFOX Chemo  - type 2 diabetes mellitus     --> not insulin dependent  - OA  - Bicept tear    SOCIAL HISTORY:   - 20pk years, quit in 1991  - previously drank about 10 drinks per week   - Worked maintenance at [employer]  - Married and lives in [place]    FAMILY HISTORY:   - Parents lived into late 90's    MEDS: (personally reviewed in EPIC, pertinent meds noted below)         OBJECTIVE DATA:   PHYSICAL EXAM:  BP 145/75  - Pulse 78  - Temp 36.3 ??C (97.4 ??F) (Tympanic)  - Ht 171.5 cm (5' 7.52)  - Wt 75.8 kg (167 lb)  - SpO2 99%  - BMI 25.75 kg/m??   Gen: - awake, alert, in NAD, very pleasant, upbeat  Vascular: good pulses throughout  CV: regular rate rhythm  Pulm: good air movement bilaterally, normal work of breathing  Abd: non distended  Ext: no LE edema, dose have deformity of RUE bicep, swelling consistent with tendon rupture  Neuro: Alert, oriented, follows commands. Moving all extremities spontaneously    LABS: (reviewed in Epic, pertinent values noted below)     PFTs: (personally reviewed and interpereted):    Date: FVC (% Pred) FEV1 (% Pred) FEF25-75(% Pred) DLCO TBBx/Results           05/27/23 4.10 3.63      02/25/23 4.08 (120%) 3.55 (137%) 87 (112%)     11/19/22  4.11 3.68      08/27/22 4.20 (123%) 3.67 (141%) 5.59 (276.9%)     05/28/22 4.16 (121.5%) 3.69 (141.4%) 3.23 (307.1%)     02/19/2022 4.03 (110.4) 3.52 (126.7) 5.19 (241.3)     11/17/21 4.13 (113%) 3.63 (130%)      08/18/21 4.01 (109%) 3.55 (127%)      05/15/2021 4.21(114%) 3.65 (130.3%) 5.54 (254%)     04/06/2021 4.08 (111%) 3.64 (129.6%) 6.01 (274.3%)     12/31/20 3.95 (107.1%) 3.46 (122.6%) 5.20 (236.4%)     12/03/2020 3.87 (104.9%) 3.47 (122.9%) 5.69 (258.3%)     10/03/2020 3.68 (99.4) 3.13 (110.6)  4.22 (190.6)      09/05/20  3.70  3.14      08/20/20 3.65 (98%) 3.15 (111%) 4.69 (211%)      08/01/20 3.64 (98.2%) 3.10 (109.3%) 4.41 (198.8%)     07/18/20 3.49 (94%) 2.98 (105%) 4.20 (189%)     07/04/20 3.43 (92.5%)  2.97 (104.7%) 4.26 (191.7%)     06/27/2020 3.13 84.5 2.70 95.4 3.51 157.8     06/20/20 3.39 (91.4%) 2.82 (99.2%) 3.34 (150.35)     06/13/20 3.10 (72.8%) 2.66 (82.5%) 3.34 (136.2%)      05/15/20      BOLT    03/11/20  2.75 (72%)  2.36 (81%)       02/12/20  2.85 (75%)  2.44 (86%)                IMAGING: Reviewed in EMR, CXR with broken sternal wires, otherwise stable    CXR (personally reviewed and interpreted)  - CXR stable, no acute process    ASSESSMENT and PLAN     Mr. Spencer Pena is a 74 y.o. man with IPF and emphysema s/p BOLT on 05/15/2020, also with type 2 diabetes mellitus, hypertension, colon cancer in 2007 s/p partial colon and hepatic wedge resection and FOLFOX here for transplant complicaitons    Graft Function and Immunosuppression:  - PFTs and CXR are stable, feels well  - No DSAs, checked quarterly, last 06/2022, pending today  - Total IgG 731 (02/2023)  - Immunosuppression:     --> Continue tac goal 5-7. Currently taking 1mg  / 2mg      --> AZA 50 mg daily (lower dose due to pancytopenia); no MMF due to GI upset    --> Prednisone 7.5mg  daily  - BOS PPX    --> Azithromycin 250mg  daily    --> ASA 81mg  daily    --> Continue atorvastatin 02/2022    --> Continue Advair BID    --> Montelukast 10mg  daily    Antimicrobial Prophylaxis:  - CMV, high risk (D+/R-): valganciclovir 450mg  daily indefinitely for Estimated Creatinine Clearance: 47.1 mL/min (A) (based on SCr of 1.35 mg/dL (H)).   Negative VL 12/29/2021. T cell assay with low immunity 09/2022, will repeat May 2025  - EBV (D+/R+): negative 12/29/21  - Fungal: Posaconazole stopped at 38mo  - PCP: Bactrim SS MWF    Hypertension  - amlodipine 5mg  every day    CKD IIIa   - sCr stable   - start amlodipine 5mg  daily     OSA   - STOP-BANG 4 points (male, age, observed apnea, snoring).  - PSG 12/29/2020   CPAP at level of 10 cm H2O with expiratory pressure relief (EPR) = 3 and with heated humidifier for nasal dryness, mask: AirFit N20 size medium or mask of patient???s preference.  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  - pH probe with DeMeester 16.7%, slightly in abnormal range but will hold off on surgical intervention at this time in the setting of normal graft function.  - Daily PPI   - no reflux symptoms, no difficulty swallowing    BPH   - terazosin 3mg  at bedtime  - UA completed at prior appt, benign.  - Normal CEA and PSA  - Follows with urology    Iron Deficiency Anemia   - receives periodic iron infusions    Hypomagnesemia   - magnesium oxide 800 mg BID    Type 2 diabetes mellitus   - metformin 1000mg  BID  - Januvia 100  - PVL ABI and carotid duplex  normal 11/2021  - fasting BG 110-120  - Last A1c (11/19/22) 5.9     Bone Health:   - Last Dexa: low bone mass on DEXA 11/2022, +1.5% at hip, +2.1% femoral neck  - on Ca and Vit D supplementation currently  - alendronate 70mg  weekly    Health Maintenance:  - Last derm visit:  12/2021  - Last Colonsocopy (if >40): 12/2021  - Last flu shot:   02/19/2022  - RSV vaccine:   Completed locally 06/2022  - Last Pneumovax:  06/10/2004, 12/30/2017  - Last Prevnar:  03/25/2016  - TdaP:   02/11/2018  - Shingrix:    03/10/20, 08/20/20  - COVID-19 (Moderna) 06/11/19, 07/09/19, 03/10/20, 07/18/2020, 12/03/20 received 1/2 dose Evushield,03/2021 (Bivalent); SpikeVax 02/19/2022 and 08/27/22.   - Last Dexa:   02/2020, 11/17/21 -> T-score -2.2 FRAX score 8%, July 2024 FRAX   - Last vitamin D:  66.9 05/2021  - Last HbA1c:   6.2 (02/2022)       Complex medical decision making was done as we adjust medications based on blood work/drug levels and multiple complaints and problems were addressed. Patient was seen and discussed by Dr. Dudley Major. 5mg  daily   - start checking daily Blood pressures  - will followup 3 months    OSA   - STOP-BANG 4 points (male, age, observed apnea, snoring).  - PSG 12/29/2020   CPAP at level of 10 cm H2O with expiratory pressure relief (EPR) = 3 and with heated humidifier for nasal dryness, mask: AirFit N20 size medium or mask of patient???s preference.  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  - pH probe with DeMeester 16.7%, slightly in abnormal range but will hold off on surgical intervention at this time in the setting of normal graft function.  - Daily PPI   - no reflux symptoms, no difficulty swallowing    BPH - terazosin 3mg  at bedtime, continues with nocturia  - UA completed at prior appt, benign.  - Normal CEA and PSA  - Follows with urology    Iron Deficiency Anemia   - receives periodic iron infusions    Hypomagnesemia   - magnesium oxide 1200 mg BID, decrease dose today to 800mg  nightly    Type 2 diabetes mellitus   - metformin 1000mg  BID  - Januvia 100  - PVL ABI and carotid duplex normal 11/2021  - fasting BG 110-120  - Last A1c (11/19/22) 5.9     Bone Health:   - Last Dexa: low bone mass on DEXA 11/2022, +1.5% at hip, +2.1% femoral neck  - on Ca and Vit D supplementation currently  - alendronate 70mg  weekly    Health Maintenance:  - Last derm visit:  12/2021  - Last Colonsocopy (if >40): 12/2021  - Last flu shot:   02/19/2022; today 02/25/2023  - RSV vaccine:   Completed locally 06/2022  - Last Pneumovax:  06/10/2004, 12/30/2017  - Last Prevnar:  03/25/2016  - TdaP:   02/11/2018  - Shingrix:    03/10/20, 08/20/20  - COVID-19 (Moderna) 06/11/19, 07/09/19, 03/10/20, 07/18/2020, 12/03/20 received 1/2 dose Evushield,03/2021 (Bivalent); SpikeVax 02/19/2022 and 08/27/22. Today 02/25/23  - Last Dexa:  02/2020, 11/17/21 -> T-score -2.2 FRAX score 8%, July 2024 FRAX   - Last vitamin D:  66.9 05/2021  - Last HbA1c:   6.2 (02/2022)    Immunization History   Administered Date(s) Administered    COVID-19 VAC,BIVALENT(67YR UP),PFIZER 08/18/2021    COVID-19 VACCINE,MRNA(MODERNA)(PF) 06/11/2019, 07/09/2019, 03/10/2020, 07/18/2020, 12/03/2020    Covid-19 Vac, (18yr+) (Comirnaty) Mrna Pfizer  02/25/2023    Covid-19 Vac, (16yr+) (Spikevax) Monovalent Moderna 02/19/2022, 08/27/2022    HEPATITIS B VACCINE ADULT, ADJUVANTED, IM(HEPLISAV B) 03/12/2020    INFLUENZA IIV3 HIGH DOSE 70YRS+(FLUZONE) 02/25/2023    INFLUENZA INJ MDCK PF, QUAD,(FLUCELVAX)(26MO AND UP EGG FREE) 02/08/2020    INFLUENZA QUAD ADJUVANTED 72YR UP(FLUAD) 05/28/2022    INFLUENZA TIV (TRI) PF (IM)(HISTORICAL) 02/27/2010    Influenza Vaccine Quad(IM)6 MO-Adult(PF) 01/19/2019, 04/06/2021, 02/19/2022    Influenza Virus Vaccine, unspecified formulation 02/07/2013, 02/28/2015, 02/05/2016, 02/02/2017, 01/27/2018    PNEUMOCOCCAL POLYSACCHARIDE 23-VALENT 06/10/2004, 12/30/2017    Pneumococcal Conjugate 13-Valent 03/25/2016    RSV VACCINE,ADJUVANTED(PF)(71YRS+)(AREXVY) 06/05/2022    SHINGRIX-ZOSTER VACCINE (HZV),RECOMBINANT,ADJUVANTED(IM) 03/10/2020, 08/20/2020    TdaP 02/15/2012, 02/11/2018       >10min was spent with the patient face to face and >59min was spent reviewing chart/imaging.     Complex medical decision making was done as we adjust medications based on blood work/drug levels and multiple complaints and problems were addressed. Patient was seen and discussed by Dr. Dudley Major.       Alesia Banda, MD  Pulmonary Critical Care Fellow

## 2023-05-27 NOTE — Unmapped (Signed)
In with Dr. Dudley Major to see Spencer Mallet Sr.. Doing well, PFTs stable.  Has bump on glute, is not concerning now but if gets bigger will get imaging.  Receiving pneumonia vaccine today. Return to clinic in 3 months. Questions answered, verbalized understanding.

## 2023-05-27 NOTE — Unmapped (Signed)
Called Spencer Mallet Sr. to discuss lab results. Tacrolimus level was essentially normal at 7.3 with a goal of 5-7. Medication regimen is currently 1/2. Discussed with [name], CPP dosing at this time to remain the same. Repeat labs scheduled. Resulted labs reviewed. All questions answered. Pt verbalized understanding.

## 2023-05-27 NOTE — Unmapped (Signed)
Per provider, the patient received Prevnar 20 vaccine.  Patient ID verified with name and date of birth.  All screening questions were answered.  Vaccine(s) were administered as ordered.  See immunization history for documentation.  Patient tolerated the injection(s) well with no issues noted.  Vaccine Information sheet given to the patient.

## 2023-05-27 NOTE — Unmapped (Signed)
 Please see pharmacy visit for documentation.

## 2023-05-27 NOTE — Unmapped (Signed)
[facility] PHARMACY NOTE  05/27/2023   Spencer CLELAND Sr.  [id number]    Medication changes today:   1. none    Education/Adherence tools provided today:  1. provided updated medication list      Next visit with pharmacy in PRN  ____________________________________________________________________    Spencer Mallet Sr. is a 73 y.o. 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     Other PMH significant for hypertension, T2DM, colon cancer s/p hemicolectomy and R hepatectomy for metastasis + chemotherapy (2007), BPH    Seen by pharmacy today for: medication management and  pill box assessment and adherence education.     CC:  Patient  has no complaints today       Vitals:    05/27/23 0854   BP: 145/75   Pulse: 78   Temp: 36.3 ??C (97.3 ??F)   SpO2: 99%         Allergies   Allergen Reactions    Cetuximab Anaphylaxis     Chest pain/rapid heart rate/ BP dropped    Enalapril      Angioedema      Venom-Honey Bee Anaphylaxis    Nsaids (Non-Steroidal Anti-Inflammatory Drug)      Interaction with transplant medications    Pollen Extracts Other (See Comments)     Nasal stuffiness, coughing, sneezing     Medications reviewed in EPIC medication station and updated today by the clinical pharmacist practitioner. Medication list includes revisions made during today???s encounter.    Outpatient Encounter Medications as of 05/27/2023   Medication Sig Dispense Refill    acetaminophen (TYLENOL EXTRA STRENGTH) 500 MG tablet Take 1-2 tablets (500-1,000 mg total) by mouth every six (6) hours as needed for pain. 180 tablet PRN    alendronate (FOSAMAX) 70 MG tablet Take 1 tablet (70 mg total) by mouth every seven (7) days. 12 tablet 3    amlodipine (NORVASC) 5 MG tablet Take 1 tablet (5 mg total) by mouth daily. 30 tablet 11    aspirin (ECOTRIN) 81 MG tablet Take 1 tablet (81 mg total) by mouth daily. 90 tablet 3    atorvastatin (LIPITOR) 40 MG tablet Take 1 tablet (40 mg total) by mouth daily. 90 tablet 3    azathioprine (IMURAN) 50 mg tablet Take 1 tablet (50 mg total) by mouth daily. 90 tablet 3    azithromycin (ZITHROMAX) 250 MG tablet Take 1 tablet (250 mg total) by mouth daily. 90 tablet 3    blood sugar diagnostic (ON CALL EXPRESS TEST STRIP) Strp Test daily before all meals/snacks and once before bedtime. 100 each 11    blood-glucose meter kit Accu-Check meter and supplies 1 each 0    calcium carbonate (CALCIUM 600) 1,500 mg (600 mg elem calcium) tablet Take 1 tablet (600 mg of elem calcium total) by mouth Two (2) times a day. 60 tablet 11    cholecalciferol, vitamin D3 25 mcg, 1,000 units,, 1,000 unit (25 mcg) tablet Take 1 tablet (25 mcg total) by mouth daily. 90 tablet 3    fluticasone propion-salmeterol (ADVAIR HFA) 115-21 mcg/actuation inhaler Inhale 2 puffs Two (2) times a day. 12 g 11    fluticasone propionate (CUTIVATE) 0.05 % cream Apply 1 Application topically daily as needed.      fluticasone propionate (FLONASE) 50 mcg/actuation nasal spray Use 2 sprays into each nostril daily. 16 g 11    magnesium oxide (MAG-OX) 400 mg (241.3 mg elemental magnesium) tablet Take  2 tablets (800 mg total) by mouth at bedtime. 180 tablet 3    metFORMIN (GLUCOPHAGE) 1000 MG tablet Take 1 tablet (1,000 mg total) by mouth Two (2) times a day. 180 tablet 3    montelukast (SINGULAIR) 10 mg tablet Take 1 tablet (10 mg total) by mouth daily. 90 tablet 3    pantoprazole (PROTONIX) 40 MG tablet Take 1 tablet (40 mg total) by mouth daily. 90 tablet 3    predniSONE (DELTASONE) 5 MG tablet Take 1.5 tablets (7.5 mg total) by mouth daily. 135 tablet 3    sildenafiL, pulm.hypertension, (REVATIO) 20 mg tablet Take 2-3 pills as needed (up to 60 mg) as needed for erectile dysfunction 60 tablet 3    SITagliptin phosphate (JANUVIA) 100 MG tablet Take 1 tablet (100 mg total) by mouth daily. 90 tablet 3    sulfamethoxazole-trimethoprim (BACTRIM) 400-80 mg per tablet Take 1 tablet (80 mg of trimethoprim total) by mouth every Monday, Wednesday, and Friday. 36 tablet 3    tacrolimus (PROGRAF) 0.5 MG capsule Take 1 capsule (0.5 mg total) by mouth daily AND 2 capsules (1 mg total) nightly. 270 capsule 3    valGANciclovir (VALCYTE) 450 mg tablet Take 1 tablet (450 mg total) by mouth daily. 30 tablet 11    [DISCONTINUED] magnesium oxide (MAG-OX) 400 mg (241.3 mg elemental magnesium) tablet Take 2 tablets (800 mg total) by mouth two (2) times a day. 360 tablet 3    [DISCONTINUED] tacrolimus (PROGRAF) 0.5 MG capsule Take 4 capsules (2 mg total) by mouth daily AND 2 capsules (1 mg total) nightly. 540 capsule 3     No facility-administered encounter medications on file as of 05/27/2023.       Immunosuppression:  Induction agent: basiliximab    Current immunosuppression:  tacrolimus 0.5mg  AM/1mg  PM  Prograf goal:  5-7  (lower goal given CKD, no signs of rejection, tolerating imuran)  Azathioprine 50 mg daily    changed from MMF 2/2 diarrhea; dose decreased from 100mg  to 75mg  on 08/01/20 given down-trending ANC and IgG   dose decrease to 50mg  on 10/03/20   prednisone 7.5 mg daily    Patient is tolerating immunosuppression well     IMMUNOSUPPRESSION DRUG LEVELS:  Lab Results   Component Value Date    Tacrolimus, Trough 7.3 05/27/2023    Tacrolimus, Trough 8.2 05/13/2023    Tacrolimus, Trough 8.1 04/15/2023    Tacrolimus, Timed 8.2 04/29/2023     Tacrolimus level is accurate 12 hour trough.    Graft function assessment via PFTs: stable  DSA:  NTD, last checked 11/19/22  Biopsies to date: none  WBC/ANC:  wnl      Plan: Will maintain current immunosuppression pending ISN levels    OI prophylaxis:   CMV Status: D+/ R-, high risk. CMV prophylaxis with valganciclovir 450 mg daily indefinately. Negative VL 12/17/2022. T cell assay with low immunity 09/2022.   Estimated Creatinine Clearance: 47.1 mL/min (A) (based on SCr of 1.35 mg/dL (H)).  PCP: Prophylaxis with bactrim SS 1 tab MWF indefinitely.  Fungal: Completed posaconazole 300 mg daily for prophylaxis x 3 months per protocol - COMPLETE (08/20/20)    Patient is tolerating infectious prophylaxis well.    Plan: continue to monitor.     Infectious History:  Patient does not have a history of pathogenic infectious complications following lung transplant. 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 Previous antiinfective history use includes meropenem x7 days for broad spectrum gram negative and aspiration PNA coverage post-op per  ICID.     Plan: Continue to monitor.    BOS/CLAD prophylaxis:  Current meds include: azithromycin 250 mg daily, motelukast 10 mg, and pantoprazole 40 mg daily, advair inhaler  Esophageal motility study on 07/23/20 showed abnormal distal esophagus exposure during recumbent posture and overall borderline normal. 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 Patient denies any reflux symptoms with every day dosing.     Plan: Continue to monitor.     CV Prophylaxis:   Aspirin: asa 81 mg daily  The 10-year ASCVD risk score (Arnett DK, et al., 2019) is: 37.9%  Statin: atorvastatin; 40 mg daily - pt tolerated switch from pravastatin well  Lipid panel 11/19/22: LDL of 46 --> within goal    Plan: Continue to monitor    BP/Edema: Goal <130/80.   Encounter vitals reported above. Recent clinic BP consistently above goal.  Home BP ranges: Not checking at home  Weights: stable  Meds include: none   Plan: BP within goal. Continue to monitor    Anemia:  H/H:   Lab Results   Component Value Date    HGB 11.7 (L) 05/27/2023     Lab Results   Component Value Date    HCT 36.1 (L) 05/27/2023     Iron panel:  Lab Results   Component Value Date    IRON 64 (L) 08/27/2022    TIBC 351 08/27/2022    FERRITIN 20.0 08/27/2022     Lab Results   Component Value Date    Iron Saturation (%) 18 (L) 08/27/2022     Prior anemia therapy: IV Ferrlecit (1/10-1/13), IV iron sucrose (1000 mg in 14 day period) completed 09/15/21    Plan: continue to monitor      DM:   Lab Results   Component Value Date    A1C 5.9 (H) 11/19/2022   Goal A1c < 7%.  Current meds include:  Metformin 1000 mg PO BID  Januvia 100 mg PO daily    Home BS log: FBG 110-120s   Hypoglycemia: no  Plan:  continue current reigmen    Electrolytes: wnl  Current meds include: MgOx 800mg  QHS  Plan: continue    Bone health:  Patient currently on long-term steroid therapy.  Vitamin D Level:  81.3 (12/10/22) . Goal > 30.   Last DEXA results (11/17/21): osteopenia  Current meds include: vitamin D3 1000 units daily, calcium carbonate 600 mg BID, alendronate 70 mg weekly    Plan: Continue current therapy. Continue to monitor.    Women's/Men's Health:  Spencer WITTS Sr. is a 73 y.o. male. Patient reports  history of BPH . Reports that BPH symptoms are controlled.  Current meds include: None  -terazosin 3 mg PO nightly (increased 09/05/20 given increased BPH symptoms, held at last visit and recommended to stop by urology on 12/31/22)   Plan:  Continue to monitor.    Adherence:   Patient has good understanding of medications.  Patient does fill their own pill box on a regular basis at home. Wife had previously been filing for him, but he now handles it on his own for the most part.   Patient brought medication card: no  Pill box: did not bring  Patient requested refills for the following meds: None  Corrections needed in Epic medication list: Rx sent for Bactrim, had been removed from med list    Plan: Provided basic adherence counseling/intervention    I spent a total of 20 minutes face  to face with the patient delivering clinical care and providing education/counseling.    Patient was reviewed with Dr. Dudley Major who were in agreement with the stated plan.    During this visit, the following was completed:   BG log data assessment  Labs ordered and evaluated  complex treatment plan >1 DS   Patient education was completed for 11-24 minutes     All questions/concerns were addressed to the patient's satisfaction.  __________________________________________  PATIENT SEEN AND EVALUATED BY:  Letha Cape, CPP, PharmD

## 2023-05-30 NOTE — Unmapped (Signed)
 Therapy Update Follow Up: Prescription has been discontinued

## 2023-05-31 ENCOUNTER — Ambulatory Visit: Admit: 2023-05-31 | Discharge: 2023-06-01 | Payer: MEDICARE

## 2023-05-31 DIAGNOSIS — Z942 Lung transplant status: Principal | ICD-10-CM

## 2023-05-31 MED ORDER — TACROLIMUS 0.5 MG CAPSULE, IMMEDIATE-RELEASE
ORAL_CAPSULE | ORAL | 3 refills | 90.00 days | Status: CP
Start: 2023-05-31 — End: 2024-05-30
  Filled 2023-06-23: qty 540, 90d supply, fill #0

## 2023-05-31 MED ORDER — MAGNESIUM OXIDE 400 MG (241.3 MG MAGNESIUM) TABLET
ORAL_TABLET | Freq: Every evening | ORAL | 3 refills | 90.00 days | Status: CP
Start: 2023-05-31 — End: 2024-05-30
  Filled 2023-06-07: qty 120, 60d supply, fill #0

## 2023-05-31 NOTE — Unmapped (Signed)
 Nasal swab was collected and sent to the lab.

## 2023-06-01 MED ORDER — OSELTAMIVIR 30 MG CAPSULE
ORAL_CAPSULE | Freq: Two times a day (BID) | ORAL | 0 refills | 10.00 days | Status: CP
Start: 2023-06-01 — End: 2023-06-11

## 2023-06-01 NOTE — Unmapped (Signed)
Opened in error. Patient does not need Tacrolimus as at least 3 weeks left on hand, and prednisone is not through Medicare part B. No questions for the pharmacist.

## 2023-06-01 NOTE — Unmapped (Signed)
Outgoing call to [employer] Sr[facility] , positive for flu.  D/W Dr. Glenard Haring and sent tamiflu 30 mg BID for 10 days. He reports that he does not feel too bad, advised to call back if symptoms worsen. Questions answered, verbalized understanding.

## 2023-06-06 MED FILL — ATORVASTATIN 40 MG TABLET: ORAL | 90 days supply | Qty: 90 | Fill #1

## 2023-06-06 MED FILL — SULFAMETHOXAZOLE 400 MG-TRIMETHOPRIM 80 MG TABLET: ORAL | 84 days supply | Qty: 36 | Fill #1

## 2023-06-06 MED FILL — PREDNISONE 5 MG TABLET: ORAL | 90 days supply | Qty: 135 | Fill #3

## 2023-06-06 MED FILL — CALCIUM 600 MG (AS CALCIUM CARBONATE 1,500 MG) TABLET: ORAL | 30 days supply | Qty: 60 | Fill #3

## 2023-06-06 MED FILL — PANTOPRAZOLE 40 MG TABLET,DELAYED RELEASE: ORAL | 90 days supply | Qty: 90 | Fill #1

## 2023-06-07 DIAGNOSIS — Z942 Lung transplant status: Principal | ICD-10-CM

## 2023-06-07 LAB — HLA DS POST TRANSPLANT
ANTI-DONOR DRW #1 MFI: 0 MFI
ANTI-DONOR DRW #2 MFI: 0 MFI
ANTI-DONOR HLA-A #1 MFI: 0 MFI
ANTI-DONOR HLA-A #2 MFI: 0 MFI
ANTI-DONOR HLA-B #1 MFI: 0 MFI
ANTI-DONOR HLA-B #2 MFI: 0 MFI
ANTI-DONOR HLA-C #2 MFI: 0 MFI
ANTI-DONOR HLA-DQB #1 MFI: 77 MFI
ANTI-DONOR HLA-DQB #2 MFI: 0 MFI
ANTI-DONOR HLA-DR #1 MFI: 0 MFI
ANTI-DONOR HLA-DR #2 MFI: 0 MFI

## 2023-06-07 LAB — FSAB CLASS 2 ANTIBODY SPECIFICITY: HLA CL2 AB RESULT: POSITIVE

## 2023-06-07 LAB — FSAB CLASS 1 ANTIBODY SPECIFICITY: HLA CLASS 1 ANTIBODY RESULT: NEGATIVE

## 2023-06-22 NOTE — Unmapped (Signed)
[facility] Delivery Pharm[facility] Refill Coordination Note    Specialty Medication(s) to be Shipped:   Transplant: tacrolimus 0.5mg  and valgancyclovir 450mg     Other medication(s) to be shipped:  amlodipine and calcium carbonate     Spencer Pena Sr., DOB: [birth year]  Phone: [phone] (home)       All above HIPAA information was verified with patient.     Was a Nurse, learning disability used for this call? No    Completed refill call assessment today to schedule patient's medication shipment from the [facility]  [phone]).  All relevant notes have been reviewed.     Specialty medication(s) and dose(s) confirmed: Regimen is correct and unchanged.   Changes to medications: Jolly reports no changes at this time.  Changes to insurance: No  New side effects reported not previously addressed with a pharmacist or physician: None reported  Questions for the pharmacist: No    Confirmed patient received a Conservation officer, historic buildings and a Surveyor, mining with first shipment. The patient will receive a drug information handout for each medication shipped and additional FDA Medication Guides as required.       DISEASE/MEDICATION-SPECIFIC INFORMATION        N/A    SPECIALTY MEDICATION ADHERENCE     Medication Adherence    Patient reported X missed doses in the last month: 0  Specialty Medication: tacrolimus 0.5 MG capsule (PROGRAF)  Patient is on additional specialty medications: Yes  Additional Specialty Medications: valGANciclovir 450 mg tablet (VALCYTE)  Patient Reported Additional Medication X Missed Doses in the Last Month: 0  Patient is on more than two specialty medications: No  Any gaps in refill history greater than 2 weeks in the last 3 months: no  Demonstrates understanding of importance of adherence: yes  Informant: patient  Confirmed plan for next specialty medication refill: delivery by pharmacy  Refills needed for supportive medications: not needed          Refill Coordination    Has the Patients' Contact Information Changed: No  Is the Shipping Address Different: No         Were doses missed due to medication being on hold? No    valGANciclovir 450  mg: 7 days of medicine on hand   tacrolimus 0.5  mg: 7 days of medicine on hand       REFERRAL TO PHARMACIST     Referral to the pharmacist: Not needed      [facility]     Shipping address confirmed in Epic.       Delivery Scheduled: Yes, Expected medication delivery date: 06/24/23.     Medication will be delivered via Next Day Courier to the prescription address in Epic WAM.    Spencer Pena   [facility]  Specialty Technician

## 2023-06-23 MED FILL — VALGANCICLOVIR 450 MG TABLET: ORAL | 30 days supply | Qty: 30 | Fill #11

## 2023-06-23 MED FILL — AMLODIPINE 5 MG TABLET: ORAL | 30 days supply | Qty: 30 | Fill #2

## 2023-06-23 MED FILL — CALCIUM 600 MG (AS CALCIUM CARBONATE 1,500 MG) TABLET: ORAL | 30 days supply | Qty: 60 | Fill #4

## 2023-06-23 NOTE — Unmapped (Signed)
 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 no complaints.      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     Assessment and plan:     # DM without DR  Hbg A1c: 5.9 (11/19/2022), 6.2 (02/19/2022)  The patient was advised to maintain tight glucose control, tight blood pressure control, and favorable levels of cholesterol.    fu today. Doing well. Stable.     fu one year, OCT, gen oph    #  h/o hemorrhagic PVD left eye  no RD/RD . No new floaters or photopsia   precautions reviewed    # pseudophakia OU.  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  I reviewed the resident???s note.  I agree with the resident???s findings and plan including interpretation of images.

## 2023-06-24 ENCOUNTER — Ambulatory Visit: Admit: 2023-06-24 | Discharge: 2023-06-25 | Payer: MEDICARE

## 2023-06-24 NOTE — Unmapped (Signed)
Information for Dry Eyes      Dry eyes can be uncomfortable. The dryness may make your eyes feel dry or hot. Your eyes may also water a lot. In some cases, dry eyes make it feel like there is sand or dirt in your eyes.  From time to time, dry eyes may cause you to have blurry vision. But dry eyes don't usually cause lasting problems with vision.  There are many causes of dry eyes. Sometimes dry weather, smoke, or pollution can bother the eyes. Other times, allergies or contact lenses irritate the eyes. Older people often have dry eyes because our eyes do not make as many tears as we age. In some cases, diseases can cause dry eyes. These include rheumatoid arthritis, lupus, and Sj??gren's syndrome. In other cases, medicines are to blame. Your doctor may want to do tests to help find the cause of your dry eyes.  You can work with your doctor to find ways to help your eyes feel better. Home treatment often helps.      How can you care for yourself at home?  Take breaks often when you read, watch TV, or use a computer. Close your eyes. Do not rub your eyes. Artificial tears may help you when you do these activities. You can buy these without a prescription.  Avoid smoke and other things that irritate the eyes.  Wear sunglasses that wrap around the sides of the head. These can protect the eyes from sun, wind, dust, and dirt.  Use a vaporizer or humidifier to add moisture to your bedroom. Follow the directions for cleaning the machine.  Do not use fans while you sleep.  If you usually wear contact lenses, use rewetting drops or wear your glasses until your eyes feel better.  Be safe with medicines. Take your medicine exactly as prescribed. Call your doctor if you think you are having a problem with your medicine.  Try using artificial tears at least 4 times a day, see below  If you need drops more than 4 times a day, use artificial tears without preservatives. They may irritate the eyes less.  Use a lubricating eye ointment or eye gel at bedtime. These are thicker and last longer, so you may have less burning, dryness, and itching when you wake up. Be aware that they may blur your vision for a short time.  To put in eyedrops or ointment:  Tilt your head back, and pull the lower eyelid down with one finger.  Drop or squirt the medicine inside the lower lid.  Close your eye for 30 to 60 seconds to let the drops or ointment move around.  Do not touch the ointment or dropper tip to your eyelashes or any other surface.  Put a warm, moist cloth on your eyelids every morning for about 5 minutes. Then massage your eyelids lightly. This helps increase the natural wetness of your eyes. See Below.       Artificial tears    The preservative free kind in individual vials are the best. They may be cheaper on [employer].com. Refresh and Systane are good brands.  If these tears as seen below by themselves do not provide adequate relief, try REFRESH CELLUVISC which is a thicker gel-based tear and can provide longer-lasting comfort.              Lubricating Ointment     Use lubricating ointment into the eye in between the eyelids prior to bedtime. There are many different brands   but I prefer Genteal or Refresh PM.         Warm Compresses    Start warm compresses over both eyes twice a day for 10-20 minutes each time. You can either use a warm wet washcloth or a heatable rice bag. To do this you can put uncooked rice in a clean sock and tie it off. Microwave 10-15 seconds until warm and place over the eyelid. This is reusable and can be microwaved over and over. After the compress is removed, massage the eyelid toward the eyelashes to milk the plugged up gland. You won't see any material come out but it will help. Do this for 30 seconds to each affected eyelid.

## 2023-07-08 ENCOUNTER — Ambulatory Visit: Admit: 2023-07-08 | Discharge: 2023-07-09 | Payer: MEDICARE

## 2023-07-08 DIAGNOSIS — Z942 Lung transplant status: Principal | ICD-10-CM

## 2023-07-08 LAB — CBC W/ AUTO DIFF
BASOPHILS ABSOLUTE COUNT: 0 10*9/L (ref 0.0–0.1)
BASOPHILS RELATIVE PERCENT: 0.6 %
EOSINOPHILS ABSOLUTE COUNT: 0.1 10*9/L (ref 0.0–0.5)
EOSINOPHILS RELATIVE PERCENT: 1.9 %
HEMATOCRIT: 33.5 % — ABNORMAL LOW (ref 39.0–48.0)
HEMOGLOBIN: 10.8 g/dL — ABNORMAL LOW (ref 12.9–16.5)
LYMPHOCYTES ABSOLUTE COUNT: 0.8 10*9/L — ABNORMAL LOW (ref 1.1–3.6)
LYMPHOCYTES RELATIVE PERCENT: 17.6 %
MEAN CORPUSCULAR HEMOGLOBIN CONC: 32.3 g/dL (ref 32.0–36.0)
MEAN CORPUSCULAR HEMOGLOBIN: 25.5 pg — ABNORMAL LOW (ref 25.9–32.4)
MEAN CORPUSCULAR VOLUME: 79 fL (ref 77.6–95.7)
MEAN PLATELET VOLUME: 7.4 fL (ref 6.8–10.7)
MONOCYTES ABSOLUTE COUNT: 0.3 10*9/L (ref 0.3–0.8)
MONOCYTES RELATIVE PERCENT: 6.6 %
NEUTROPHILS ABSOLUTE COUNT: 3.2 10*9/L (ref 1.8–7.8)
NEUTROPHILS RELATIVE PERCENT: 73.3 %
NUCLEATED RED BLOOD CELLS: 0 /100{WBCs} (ref <=4)
PLATELET COUNT: 175 10*9/L (ref 150–450)
RED BLOOD CELL COUNT: 4.24 10*12/L — ABNORMAL LOW (ref 4.26–5.60)
RED CELL DISTRIBUTION WIDTH: 16.8 % — ABNORMAL HIGH (ref 12.2–15.2)
WBC ADJUSTED: 4.4 10*9/L (ref 3.6–11.2)

## 2023-07-08 LAB — COMPREHENSIVE METABOLIC PANEL
ALBUMIN: 3.6 g/dL (ref 3.4–5.0)
ALKALINE PHOSPHATASE: 85 U/L (ref 46–116)
ALT (SGPT): 19 U/L (ref 10–49)
ANION GAP: 10 mmol/L (ref 5–14)
AST (SGOT): 20 U/L (ref <=34)
BILIRUBIN TOTAL: 0.4 mg/dL (ref 0.3–1.2)
BLOOD UREA NITROGEN: 26 mg/dL — ABNORMAL HIGH (ref 9–23)
BUN / CREAT RATIO: 19
CALCIUM: 9.7 mg/dL (ref 8.7–10.4)
CHLORIDE: 103 mmol/L (ref 98–107)
CO2: 31.4 mmol/L — ABNORMAL HIGH (ref 20.0–31.0)
CREATININE: 1.39 mg/dL — ABNORMAL HIGH (ref 0.73–1.18)
EGFR CKD-EPI (2021) MALE: 54 mL/min/{1.73_m2} — ABNORMAL LOW (ref >=60)
GLUCOSE RANDOM: 160 mg/dL (ref 70–179)
POTASSIUM: 4.4 mmol/L (ref 3.4–4.8)
PROTEIN TOTAL: 6.5 g/dL (ref 5.7–8.2)
SODIUM: 144 mmol/L (ref 135–145)

## 2023-07-08 LAB — PHOSPHORUS: PHOSPHORUS: 1.9 mg/dL — ABNORMAL LOW (ref 2.4–5.1)

## 2023-07-08 LAB — TACROLIMUS LEVEL, TROUGH: TACROLIMUS, TROUGH: 9.1 ng/mL (ref 5.0–15.0)

## 2023-07-08 LAB — IGG: GAMMAGLOBULIN; IGG: 734 mg/dL (ref 650–1600)

## 2023-07-08 LAB — MAGNESIUM: MAGNESIUM: 1.8 mg/dL (ref 1.6–2.6)

## 2023-07-08 MED ORDER — TACROLIMUS 0.5 MG CAPSULE, IMMEDIATE-RELEASE
ORAL_CAPSULE | Freq: Two times a day (BID) | ORAL | 3 refills | 90.00 days | Status: CP
Start: 2023-07-08 — End: 2024-07-07
  Filled 2023-10-04: qty 360, 90d supply, fill #0

## 2023-07-08 NOTE — Unmapped (Signed)
 Colonoscopy  Procedure #1     Procedure #2   [id number]  MRN   GENERIC   Endoscopist     Is the patient's health insurance [employer], [employer], [place] [facility]), [employer] [place], [employer], or [place]?     Urgent procedure     Are you pregnant?     Are you in the process of scheduling or awaiting results of a heart ultrasound, stress test, or catheterization to evaluate new or worsening chest pain, dizziness, or shortness of breath?     Do you take: Plavix (clopidogrel), Coumadin (warfarin), Lovenox (enoxaparin), Pradaxa (dabigatran), Effient (prasugrel), Xarelto (rivaroxaban), Eliquis (apixaban), Pletal (cilostazol), or Brilinta (ticagrelor)?          Did ordering provider indicate how long to hold this medication in the order comments?          Which of the above medications are you taking?          What is the name of the medical practice that manages this medication?          What is the name of the medical provider who manages this medication?     Do you have hemophilia, von Willebrand disease, or low platelets?     Do you have a pacemaker or implanted cardiac defibrillator?     Has a [facility] provider specified the location(s)?     Which location(s) did the [facility] provider specify?          [facility]          [place]          [facility]     Do you see a liver specialist for chronic liver disease?     Is the procedure indication for variceal screening?     Is procedure indication for variceal banding (this does NOT include variceal screening)?     Have you had a heart attack, stroke or heart stent placement within the past 6 months?     Month of event     Year of event (ONLY ENTER LAST 2 DIGITS)        5  Height (feet)   7  Height (inches)   172  Weight (pounds)   26.9  BMI          Did the ordering provider specify a bowel prep?          What bowel prep was specified?     Do you have an ostomy (bag on your stomach that collects your stool)?          Is it an ileostomy?          Is it a colostomy?          Patient doesn't know.     Do you have chronic kidney disease?     Do you have chronic constipation or have you had poor quality bowel preps for past colonoscopies?     Do you have Crohn's disease or ulcerative colitis?     Have you had weight loss surgery?          When you walk around your house or grocery store, do you have to stop and rest due to shortness of breath, chest pain, or light-headedness?     Do you ever use supplemental oxygen?     Have you been hospitalized for cirrhosis of the liver or heart failure in the last 12 months?     Have you been treated for mouth or  throat cancer with radiation or surgery?     Have you been told that it is difficult for doctors to insert a breathing tube in you during anesthesia?   TRUE  Have you had a heart or lung transplant?          Are you on dialysis?     Do you have cirrhosis of the liver?     Do you have myasthenia gravis?     Is the patient a prisoner?   ################# ## ###################################################################################################################   MRN:  [id number]   Anticoag Review  No   Nurse Triage  No   GI clinic consult  No   Procedure(s):  Colonoscopy     0   Endoscopist:  GENERIC    Urgent:  No   Prep:  Nulytely Prep                  --------------------------- --- ----------------------------------------------------------------------------------------------------------------------------------------------------------------------------   [facility]:  [facility]                  Requested Locations:              ################# ## ###################################################################################################################

## 2023-07-08 NOTE — Unmapped (Signed)
 Called Spencer Mallet Sr. to discuss lab results. Tacrolimus level was increased at 9.1 with a goal of 5-7. Medication regimen is currently 1/2. Discussed with [name], CPP dosing at this time decreased to  1 mg BID. Repeat labs scheduled one week. Resulted labs reviewed. All questions answered. Pt verbalized understanding.

## 2023-07-09 LAB — CMV DNA, QUANTITATIVE, PCR
CMV QUANT: <35 [IU]/mL — ABNORMAL HIGH (ref <0)
CMV VIRAL LD: DETECTED — AB

## 2023-07-09 LAB — EBV QUANTITATIVE PCR, BLOOD: EBV VIRAL LOAD RESULT: NOT DETECTED

## 2023-07-11 MED ORDER — POLYETHYLENE GLYCOL 3350 17 GRAM/DOSE ORAL POWDER
0 refills | 0.00 days | Status: CP
Start: 2023-07-11 — End: ?

## 2023-07-11 MED ORDER — PEG 3350-ELECTROLYTES 236 GRAM-22.74 GRAM-6.74 GRAM-5.86 GRAM SOLUTION
Freq: Once | ORAL | 0 refills | 1.00 days | Status: CP
Start: 2023-07-11 — End: 2023-07-11

## 2023-07-12 NOTE — Unmapped (Addendum)
 [facility] Pharmacist has reviewed a new prescription for tacrolimus that indicates a dose decrease.  Patient was counseled on this dosage change by coordinator CL- see epic note from 2/28.  Next refill call date adjusted if necessary.        Clinical Assessment Needed For: Dose Change  Medication: Tacrolimus 0.5mg  capsule  Last Fill Date/Day Supply: 20/13/2025 / 90 days  Refill Too Soon until 09/06/2023  Was previous dose already scheduled to fill: No    Notes to Pharmacist: Will re-test on 04/29

## 2023-07-15 ENCOUNTER — Ambulatory Visit: Admit: 2023-07-15 | Discharge: 2023-07-16 | Payer: MEDICARE

## 2023-07-15 LAB — CBC W/ AUTO DIFF
BASOPHILS ABSOLUTE COUNT: 0 10*9/L (ref 0.0–0.1)
BASOPHILS RELATIVE PERCENT: 0.5 %
EOSINOPHILS ABSOLUTE COUNT: 0.1 10*9/L (ref 0.0–0.5)
EOSINOPHILS RELATIVE PERCENT: 2.1 %
HEMATOCRIT: 33.5 % — ABNORMAL LOW (ref 39.0–48.0)
HEMOGLOBIN: 10.7 g/dL — ABNORMAL LOW (ref 12.9–16.5)
LYMPHOCYTES ABSOLUTE COUNT: 0.7 10*9/L — ABNORMAL LOW (ref 1.1–3.6)
LYMPHOCYTES RELATIVE PERCENT: 15.2 %
MEAN CORPUSCULAR HEMOGLOBIN CONC: 32 g/dL (ref 32.0–36.0)
MEAN CORPUSCULAR HEMOGLOBIN: 25.5 pg — ABNORMAL LOW (ref 25.9–32.4)
MEAN CORPUSCULAR VOLUME: 79.4 fL (ref 77.6–95.7)
MEAN PLATELET VOLUME: 7.1 fL (ref 6.8–10.7)
MONOCYTES ABSOLUTE COUNT: 0.4 10*9/L (ref 0.3–0.8)
MONOCYTES RELATIVE PERCENT: 9 %
NEUTROPHILS ABSOLUTE COUNT: 3.5 10*9/L (ref 1.8–7.8)
NEUTROPHILS RELATIVE PERCENT: 73.2 %
NUCLEATED RED BLOOD CELLS: 0 /100{WBCs} (ref <=4)
PLATELET COUNT: 184 10*9/L (ref 150–450)
RED BLOOD CELL COUNT: 4.21 10*12/L — ABNORMAL LOW (ref 4.26–5.60)
RED CELL DISTRIBUTION WIDTH: 17.1 % — ABNORMAL HIGH (ref 12.2–15.2)
WBC ADJUSTED: 4.8 10*9/L (ref 3.6–11.2)

## 2023-07-15 LAB — TACROLIMUS LEVEL, TROUGH: TACROLIMUS, TROUGH: 4.5 ng/mL — ABNORMAL LOW (ref 5.0–15.0)

## 2023-07-15 LAB — COMPREHENSIVE METABOLIC PANEL
ALBUMIN: 3.7 g/dL (ref 3.4–5.0)
ALKALINE PHOSPHATASE: 92 U/L (ref 46–116)
ALT (SGPT): 16 U/L (ref 10–49)
ANION GAP: 11 mmol/L (ref 5–14)
AST (SGOT): 18 U/L (ref <=34)
BILIRUBIN TOTAL: 0.3 mg/dL (ref 0.3–1.2)
BLOOD UREA NITROGEN: 31 mg/dL — ABNORMAL HIGH (ref 9–23)
BUN / CREAT RATIO: 25
CALCIUM: 10.1 mg/dL (ref 8.7–10.4)
CHLORIDE: 104 mmol/L (ref 98–107)
CO2: 30.7 mmol/L (ref 20.0–31.0)
CREATININE: 1.23 mg/dL — ABNORMAL HIGH (ref 0.73–1.18)
EGFR CKD-EPI (2021) MALE: 62 mL/min/{1.73_m2} (ref >=60)
GLUCOSE RANDOM: 97 mg/dL (ref 70–179)
POTASSIUM: 4 mmol/L (ref 3.4–4.8)
PROTEIN TOTAL: 6.7 g/dL (ref 5.7–8.2)
SODIUM: 146 mmol/L — ABNORMAL HIGH (ref 135–145)

## 2023-07-15 LAB — PHOSPHORUS: PHOSPHORUS: 3.7 mg/dL (ref 2.4–5.1)

## 2023-07-15 LAB — MAGNESIUM: MAGNESIUM: 1.8 mg/dL (ref 1.6–2.6)

## 2023-07-15 LAB — IGG: GAMMAGLOBULIN; IGG: 719 mg/dL (ref 650–1600)

## 2023-07-15 NOTE — Unmapped (Signed)
 Called [name] to discuss lab results. Tacrolimus level was essentially normal at 4.5 with a goal of 5-7. Medication regimen is currently 1 mg BID. Discussed with [name], CPP dosing at this time to remain the same. Repeat labs scheduled two weeks. Resulted labs reviewed. All questions answered. Pt verbalized understanding.

## 2023-07-20 DIAGNOSIS — B259 Cytomegaloviral disease, unspecified: Principal | ICD-10-CM

## 2023-07-20 MED ORDER — VALGANCICLOVIR 450 MG TABLET
ORAL_TABLET | Freq: Every day | ORAL | 11 refills | 30.00 days | Status: CP
Start: 2023-07-20 — End: ?
  Filled 2023-07-21: qty 60, 60d supply, fill #0

## 2023-07-20 NOTE — Unmapped (Signed)
 [facility] Delivery Pharm[facility] Refill Coordination Note    Specialty Medication(s) to be Shipped:   Transplant: valgancyclovir 450mg     Other medication(s) to be shipped:  aspirin , vitamin d3 , Januvia , mag-ox and tylenol     Spencer POSTHUMUS Sr., DOB: 11-08-1950  Phone: [phone] (home)       All above HIPAA information was verified with patient.     Was a Nurse, learning disability used for this call? No    Completed refill call assessment today to schedule patient's medication shipment from the [facility] Delivery Pharm[facility]  [phone]).  All relevant notes have been reviewed.     Specialty medication(s) and dose(s) confirmed: Regimen is correct and unchanged.   Changes to medications: [name] reports no changes at this time.  Changes to insurance: No  New side effects reported not previously addressed with a pharmacist or physician: None reported  Questions for the pharmacist: No    Confirmed patient received a Conservation officer, historic buildings and a Surveyor, mining with first shipment. The patient will receive a drug information handout for each medication shipped and additional FDA Medication Guides as required.       DISEASE/MEDICATION-SPECIFIC INFORMATION        N/A    SPECIALTY MEDICATION ADHERENCE     Medication Adherence    Patient reported X missed doses in the last month: 0  Specialty Medication: valGANciclovir 450 mg tablet (VALCYTE)  Patient is on additional specialty medications: No              Were doses missed due to medication being on hold? No    valganciclovir 450 mg: 3 days of medicine on hand       REFERRAL TO PHARMACIST     Referral to the pharmacist: Not needed      [facility]     Shipping address confirmed in Epic.     Cost and Payment: Patient has a copay of $102.60. They are aware and have authorized the pharmacy to charge the credit card on file.    Delivery Scheduled: Yes, Expected medication delivery date: 07/22/2023.     Medication will be delivered via Next Day Courier to the prescription address in Epic WAM.    [name]   [facility]  Specialty Technician

## 2023-07-20 NOTE — Unmapped (Signed)
 Pt request for RX Refill

## 2023-07-21 MED FILL — CHOLECALCIFEROL (VITAMIN D3) 25 MCG (1,000 UNIT) TABLET: ORAL | 100 days supply | Qty: 100 | Fill #2

## 2023-07-21 MED FILL — MAGNESIUM OXIDE 400 MG (241.3 MG MAGNESIUM) TABLET: ORAL | 60 days supply | Qty: 120 | Fill #1

## 2023-07-21 MED FILL — ASPIRIN 81 MG TABLET,DELAYED RELEASE: ORAL | 90 days supply | Qty: 90 | Fill #0

## 2023-07-21 MED FILL — JANUVIA 100 MG TABLET: ORAL | 90 days supply | Qty: 90 | Fill #3

## 2023-07-21 MED FILL — ACETAMINOPHEN 500 MG TABLET: ORAL | 23 days supply | Qty: 180 | Fill #1

## 2023-07-28 MED FILL — METFORMIN 1,000 MG TABLET: ORAL | 90 days supply | Qty: 180 | Fill #3

## 2023-07-28 MED FILL — CALCIUM 600 MG (AS CALCIUM CARBONATE 1,500 MG) TABLET: ORAL | 30 days supply | Qty: 60 | Fill #5

## 2023-07-28 MED FILL — AMLODIPINE 5 MG TABLET: ORAL | 30 days supply | Qty: 30 | Fill #3

## 2023-07-29 ENCOUNTER — Ambulatory Visit: Admit: 2023-07-29 | Discharge: 2023-07-30 | Payer: MEDICARE

## 2023-07-29 LAB — CBC W/ AUTO DIFF
BASOPHILS ABSOLUTE COUNT: 0 10*9/L (ref 0.0–0.1)
BASOPHILS RELATIVE PERCENT: 0.6 %
EOSINOPHILS ABSOLUTE COUNT: 0.1 10*9/L (ref 0.0–0.5)
EOSINOPHILS RELATIVE PERCENT: 2.1 %
HEMATOCRIT: 34.2 % — ABNORMAL LOW (ref 39.0–48.0)
HEMOGLOBIN: 11.1 g/dL — ABNORMAL LOW (ref 12.9–16.5)
LYMPHOCYTES ABSOLUTE COUNT: 0.8 10*9/L — ABNORMAL LOW (ref 1.1–3.6)
LYMPHOCYTES RELATIVE PERCENT: 16.6 %
MEAN CORPUSCULAR HEMOGLOBIN CONC: 32.5 g/dL (ref 32.0–36.0)
MEAN CORPUSCULAR HEMOGLOBIN: 25.7 pg — ABNORMAL LOW (ref 25.9–32.4)
MEAN CORPUSCULAR VOLUME: 78.8 fL (ref 77.6–95.7)
MEAN PLATELET VOLUME: 7 fL (ref 6.8–10.7)
MONOCYTES ABSOLUTE COUNT: 0.4 10*9/L (ref 0.3–0.8)
MONOCYTES RELATIVE PERCENT: 8.7 %
NEUTROPHILS ABSOLUTE COUNT: 3.5 10*9/L (ref 1.8–7.8)
NEUTROPHILS RELATIVE PERCENT: 72 %
NUCLEATED RED BLOOD CELLS: 0 /100{WBCs} (ref <=4)
PLATELET COUNT: 186 10*9/L (ref 150–450)
RED BLOOD CELL COUNT: 4.33 10*12/L (ref 4.26–5.60)
RED CELL DISTRIBUTION WIDTH: 16.6 % — ABNORMAL HIGH (ref 12.2–15.2)
WBC ADJUSTED: 4.9 10*9/L (ref 3.6–11.2)

## 2023-07-29 LAB — COMPREHENSIVE METABOLIC PANEL
ALBUMIN: 3.9 g/dL (ref 3.4–5.0)
ALKALINE PHOSPHATASE: 80 U/L (ref 46–116)
ALT (SGPT): 20 U/L (ref 10–49)
ANION GAP: 9 mmol/L (ref 5–14)
AST (SGOT): 19 U/L (ref <=34)
BILIRUBIN TOTAL: 0.4 mg/dL (ref 0.3–1.2)
BLOOD UREA NITROGEN: 35 mg/dL — ABNORMAL HIGH (ref 9–23)
BUN / CREAT RATIO: 27
CALCIUM: 10.1 mg/dL (ref 8.7–10.4)
CHLORIDE: 104 mmol/L (ref 98–107)
CO2: 30.6 mmol/L (ref 20.0–31.0)
CREATININE: 1.32 mg/dL — ABNORMAL HIGH (ref 0.73–1.18)
EGFR CKD-EPI (2021) MALE: 57 mL/min/{1.73_m2} — ABNORMAL LOW (ref >=60)
GLUCOSE RANDOM: 108 mg/dL (ref 70–179)
POTASSIUM: 4.1 mmol/L (ref 3.4–4.8)
PROTEIN TOTAL: 6.8 g/dL (ref 5.7–8.2)
SODIUM: 144 mmol/L (ref 135–145)

## 2023-07-29 LAB — PHOSPHORUS: PHOSPHORUS: 3.6 mg/dL (ref 2.4–5.1)

## 2023-07-29 LAB — MAGNESIUM: MAGNESIUM: 1.9 mg/dL (ref 1.6–2.6)

## 2023-07-29 LAB — IGG: GAMMAGLOBULIN; IGG: 693 mg/dL (ref 650–1600)

## 2023-07-29 LAB — TACROLIMUS LEVEL, TROUGH: TACROLIMUS, TROUGH: 7 ng/mL (ref 5.0–15.0)

## 2023-07-29 NOTE — Unmapped (Signed)
 Called [name] to discuss lab results. Tacrolimus level was normal at 7 with a goal of 5-7. Medication regimen is currently 1 mg BID. No changes made, level within normal range.  dosing at this time to remain the same. Repeat labs scheduled monthly. Resulted labs reviewed. All questions answered. Pt verbalized understanding.

## 2023-08-04 MED FILL — ALENDRONATE 70 MG TABLET: ORAL | 84 days supply | Qty: 12 | Fill #3

## 2023-08-04 MED FILL — AZITHROMYCIN 250 MG TABLET: ORAL | 90 days supply | Qty: 90 | Fill #2

## 2023-08-08 NOTE — Unmapped (Signed)
 Spoke with Mr. Spencer Pena to return his call regarding prep for procedure. Patient reports he has now reviewed the timeline sent by this nurse and has a couple of questions.     Does not have Dulcolax but has something from a previous procedure that is a laxative. Patient wondering if he needs to go out and get Dulcolax but has already started drinking prep solution. Advised that patient is ok to use that laxative instead or just skip since we are a day late now.    Patient reports he has already started drinking prep solution- advised to save some prep to drink the morning of the procedure. Determined through questions that patient mixed up Golytely and started to drink it earlier today. Reviewed that this is typically the last step of prep, but it's ok. Advised to please drink 8 glasses of prep total today and then stop, put in fridge overnight.   At 4-5pm tomorrow, drink the rest of the prep solution (Golytely). Wednesday morning, can drink the Miralax prep solution- reviewed how to mix and that to use electrolyte replacement fluid such as Gatorade. Patient confirmed he will get orange Gatorade and use the gallon jug once it's empty, to mix this up.     Reviewed med list. Hold all vitamins and supplements tomorrow and Wednesday, until returning home.   Hold Metformin and Januvia on Wednesday morning.   Other meds, ok to take.     Patient confirmed he is following a clear fluid diet now and had only jello earlier today.     No further questions at this time.

## 2023-08-09 MED ORDER — MONTELUKAST 10 MG TABLET
ORAL_TABLET | Freq: Every day | ORAL | 3 refills | 90 days | Status: CP
Start: 2023-08-09 — End: 2024-08-08
  Filled 2023-08-11: qty 90, 90d supply, fill #0

## 2023-08-10 ENCOUNTER — Encounter: Admit: 2023-08-10 | Discharge: 2023-08-10 | Attending: Anesthesiology | Primary: Anesthesiology

## 2023-08-10 ENCOUNTER — Inpatient Hospital Stay: Admit: 2023-08-10 | Discharge: 2023-08-10 | Payer: MEDICARE

## 2023-08-10 MED ADMIN — sodium chloride (NS) 0.9 % infusion: 10 mL/h | INTRAVENOUS | @ 14:00:00 | Stop: 2023-08-10

## 2023-08-10 MED ADMIN — propofol (DIPRIVAN) infusion 10 mg/mL: INTRAVENOUS | @ 14:00:00 | Stop: 2023-08-10

## 2023-08-10 MED ADMIN — Propofol (DIPRIVAN) injection: INTRAVENOUS | @ 14:00:00 | Stop: 2023-08-10

## 2023-08-11 MED FILL — AZATHIOPRINE 50 MG TABLET: ORAL | 90 days supply | Qty: 90 | Fill #3

## 2023-08-16 DIAGNOSIS — Z942 Lung transplant status: Principal | ICD-10-CM

## 2023-08-24 MED ORDER — PREDNISONE 5 MG TABLET
ORAL_TABLET | Freq: Every day | ORAL | 3 refills | 90.00 days | Status: CP
Start: 2023-08-24 — End: ?

## 2023-08-24 NOTE — Unmapped (Signed)
 [facility] and Home Delivery Pharmacy Refill Coordination Note    Specialty Medication(s) to be Shipped:   Transplant: Prednisone  5mg     Other medication(s) to be shipped: sulfamethoxazole -trimethoprim  400-80 mg per tablet (BACTRIM ), pantoprazole  40 MG tablet (PROTONIX ), amlodipine  5 MG tablet (NORVASC )     Spencer Bur Sr., DOB: 08-Jan-1951  Phone: [phone] (home)       All above HIPAA information was verified with patient.     Was a Nurse, learning disability used for this call? No    Completed refill call assessment today to schedule patient's medication shipment from the [facility] and Home Delivery Pharmacy  [phone]).  All relevant notes have been reviewed.     Specialty medication(s) and dose(s) confirmed: Regimen is correct and unchanged.   Changes to medications: Khaleem reports no changes at this time.  Changes to insurance: No  New side effects reported not previously addressed with a pharmacist or physician: None reported  Questions for the pharmacist: No    Confirmed patient received a Conservation officer, historic buildings and a Surveyor, mining with first shipment. The patient will receive a drug information handout for each medication shipped and additional FDA Medication Guides as required.       DISEASE/MEDICATION-SPECIFIC INFORMATION        N/A    SPECIALTY MEDICATION ADHERENCE     Medication Adherence    Patient reported X missed doses in the last month: 0  Specialty Medication: predniSONE  5 MG tablet (DELTASONE )  Patient is on additional specialty medications: No  Patient is on more than two specialty medications: No  Any gaps in refill history greater than 2 weeks in the last 3 months: no  Demonstrates understanding of importance of adherence: yes              Were doses missed due to medication being on hold? No    predniSONE  5  mg: 7 days of medicine on hand       REFERRAL TO PHARMACIST     Referral to the pharmacist: Not needed      [facility]     Shipping address confirmed in Epic.     Cost and Payment: Patient has a copay of $17.99. They are aware and have authorized the pharmacy to charge the credit card on file.    Delivery Scheduled: Yes, Expected medication delivery date: 08/26/23.     Medication will be delivered via Same Day Courier to the prescription address in Epic WAM.    Stephen Ehrlich   [facility] Delivery Pharm[facility]  Specialty Technician

## 2023-08-26 ENCOUNTER — Inpatient Hospital Stay: Admit: 2023-08-26 | Discharge: 2023-08-27 | Payer: Medicare (Managed Care)

## 2023-08-26 ENCOUNTER — Ambulatory Visit: Admit: 2023-08-26 | Discharge: 2023-08-27 | Payer: Medicare (Managed Care)

## 2023-08-26 DIAGNOSIS — Z48298 Encounter for aftercare following other organ transplant: Principal | ICD-10-CM

## 2023-08-26 DIAGNOSIS — Z942 Lung transplant status: Principal | ICD-10-CM

## 2023-08-26 DIAGNOSIS — I10 Essential (primary) hypertension: Principal | ICD-10-CM

## 2023-08-26 DIAGNOSIS — Z79899 Other long term (current) drug therapy: Principal | ICD-10-CM

## 2023-08-26 DIAGNOSIS — C189 Malignant neoplasm of colon, unspecified: Principal | ICD-10-CM

## 2023-08-26 LAB — COMPREHENSIVE METABOLIC PANEL
ALBUMIN: 4 g/dL (ref 3.4–5.0)
ALKALINE PHOSPHATASE: 91 U/L (ref 46–116)
ALT (SGPT): 20 U/L (ref 10–49)
ANION GAP: 9 mmol/L (ref 5–14)
AST (SGOT): 22 U/L (ref <=34)
BILIRUBIN TOTAL: 0.4 mg/dL (ref 0.3–1.2)
BLOOD UREA NITROGEN: 24 mg/dL — ABNORMAL HIGH (ref 9–23)
BUN / CREAT RATIO: 18
CALCIUM: 9.9 mg/dL (ref 8.7–10.4)
CHLORIDE: 101 mmol/L (ref 98–107)
CO2: 33 mmol/L — ABNORMAL HIGH (ref 20.0–31.0)
CREATININE: 1.33 mg/dL — ABNORMAL HIGH (ref 0.73–1.18)
EGFR CKD-EPI (2021) MALE: 56 mL/min/1.73m2 — ABNORMAL LOW (ref >=60)
GLUCOSE RANDOM: 121 mg/dL (ref 70–179)
POTASSIUM: 3.9 mmol/L (ref 3.4–4.8)
PROTEIN TOTAL: 7 g/dL (ref 5.7–8.2)
SODIUM: 143 mmol/L (ref 135–145)

## 2023-08-26 LAB — CBC W/ AUTO DIFF
BASOPHILS ABSOLUTE COUNT: 0 10*9/L (ref 0.0–0.1)
BASOPHILS RELATIVE PERCENT: 0.7 %
EOSINOPHILS ABSOLUTE COUNT: 0.1 10*9/L (ref 0.0–0.5)
EOSINOPHILS RELATIVE PERCENT: 2 %
HEMATOCRIT: 33.6 % — ABNORMAL LOW (ref 39.0–48.0)
HEMOGLOBIN: 10.9 g/dL — ABNORMAL LOW (ref 12.9–16.5)
LYMPHOCYTES ABSOLUTE COUNT: 0.7 10*9/L — ABNORMAL LOW (ref 1.1–3.6)
LYMPHOCYTES RELATIVE PERCENT: 15.7 %
MEAN CORPUSCULAR HEMOGLOBIN CONC: 32.5 g/dL (ref 32.0–36.0)
MEAN CORPUSCULAR HEMOGLOBIN: 25.3 pg — ABNORMAL LOW (ref 25.9–32.4)
MEAN CORPUSCULAR VOLUME: 77.9 fL (ref 77.6–95.7)
MEAN PLATELET VOLUME: 7.5 fL (ref 6.8–10.7)
MONOCYTES ABSOLUTE COUNT: 0.4 10*9/L (ref 0.3–0.8)
MONOCYTES RELATIVE PERCENT: 7.8 %
NEUTROPHILS ABSOLUTE COUNT: 3.5 10*9/L (ref 1.8–7.8)
NEUTROPHILS RELATIVE PERCENT: 73.8 %
PLATELET COUNT: 202 10*9/L (ref 150–450)
RED BLOOD CELL COUNT: 4.31 10*12/L (ref 4.26–5.60)
RED CELL DISTRIBUTION WIDTH: 17.6 % — ABNORMAL HIGH (ref 12.2–15.2)
WBC ADJUSTED: 4.7 10*9/L (ref 3.6–11.2)

## 2023-08-26 LAB — MAGNESIUM: MAGNESIUM: 1.8 mg/dL (ref 1.6–2.6)

## 2023-08-26 LAB — HEMOGLOBIN A1C
ESTIMATED AVERAGE GLUCOSE: 183 mg/dL
HEMOGLOBIN A1C: 8 % — ABNORMAL HIGH (ref 4.8–5.6)

## 2023-08-26 LAB — PHOSPHORUS: PHOSPHORUS: 2.9 mg/dL (ref 2.4–5.1)

## 2023-08-26 LAB — TACROLIMUS LEVEL, TROUGH: TACROLIMUS, TROUGH: 7.7 ng/mL (ref 5.0–15.0)

## 2023-08-26 LAB — IGG: GAMMAGLOBULIN; IGG: 719 mg/dL (ref 650–1600)

## 2023-08-26 MED ORDER — PREDNISONE 5 MG TABLET
ORAL_TABLET | Freq: Every day | ORAL | 3 refills | 90.00 days | Status: CP
Start: 2023-08-26 — End: ?
  Filled 2023-08-26: qty 90, 90d supply, fill #0

## 2023-08-26 MED FILL — AMLODIPINE 5 MG TABLET: ORAL | 30 days supply | Qty: 30 | Fill #4

## 2023-08-26 MED FILL — SULFAMETHOXAZOLE 400 MG-TRIMETHOPRIM 80 MG TABLET: ORAL | 84 days supply | Qty: 36 | Fill #2

## 2023-08-26 MED FILL — PANTOPRAZOLE 40 MG TABLET,DELAYED RELEASE: ORAL | 90 days supply | Qty: 90 | Fill #2

## 2023-08-26 NOTE — Unmapped (Signed)
 [facility] CLINIC PHARMACY NOTE  08/26/2023   Spencer RUGG Sr.  [id number]    Medication changes today:   1. Dose reduce prednisone from 7.5 mg to 5 mg daily   2. Administer COVID19 immunization     Education/Adherence tools provided today:  1. provided updated medication list    Follow up items:   Glycemic control    Next visit with pharmacy in PRN  ____________________________________________________________________    Spencer Bur Sr. is a 73 y.o. 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    Other PMH significant for hypertension, T2DM, colon cancer s/p hemicolectomy and R hepatectomy for metastasis + chemotherapy (2007), BPH    Seen by pharmacy today for: medication management and  pill box assessment and adherence education.     CC:  Patient  has no complaints today      Vitals:    08/26/23 0909   BP: 176/90   Pulse: 78   Temp: 36.2 ??C (97.2 ??F)   SpO2: 98%         Allergies   Allergen Reactions    Cetuximab Anaphylaxis     Chest pain/rapid heart rate/ BP dropped    Enalapril      Angioedema      Venom-Honey Bee Anaphylaxis    Nsaids (Non-Steroidal Anti-Inflammatory Drug)      Interaction with transplant medications    Pollen Extracts Other (See Comments)     Nasal stuffiness, coughing, sneezing     Medications reviewed in EPIC medication station and updated today by the clinical pharmacist practitioner. Medication list includes revisions made during today???s encounter.    Outpatient Encounter Medications as of 08/26/2023   Medication Sig Dispense Refill    acetaminophen (TYLENOL EXTRA STRENGTH) 500 MG tablet Take 1-2 tablets (500-1,000 mg total) by mouth every six (6) hours as needed for pain. 180 tablet PRN    alendronate (FOSAMAX) 70 MG tablet Take 1 tablet (70 mg total) by mouth every seven (7) days. 12 tablet 3    amlodipine (NORVASC) 5 MG tablet Take 1 tablet (5 mg total) by mouth daily. 30 tablet 11    aspirin (ECOTRIN) 81 MG tablet Take 1 tablet (81 mg total) by mouth daily. 90 tablet 3    atorvastatin (LIPITOR) 40 MG tablet Take 1 tablet (40 mg total) by mouth daily. 90 tablet 3    azathioprine (IMURAN) 50 mg tablet Take 1 tablet (50 mg total) by mouth daily. 90 tablet 3    azithromycin (ZITHROMAX) 250 MG tablet Take 1 tablet (250 mg total) by mouth daily. 90 tablet 3    blood sugar diagnostic (ON CALL EXPRESS TEST STRIP) Strp Test daily before all meals/snacks and once before bedtime. 100 each 11    blood-glucose meter kit Accu-Check meter and supplies 1 each 0    calcium carbonate (CALCIUM 600) 1,500 mg (600 mg elem calcium) tablet Take 1 tablet (600 mg of elem calcium total) by mouth Two (2) times a day. 60 tablet 11    cholecalciferol, vitamin D3 25 mcg, 1,000 units,, 1,000 unit (25 mcg) tablet Take 1 tablet (25 mcg total) by mouth daily. 90 tablet 3    fluticasone propion-salmeterol (ADVAIR HFA) 115-21 mcg/actuation inhaler Inhale 2 puffs Two (2) times a day. 12 g 11    fluticasone propionate (CUTIVATE) 0.05 % cream Apply 1 Application topically daily as needed.      fluticasone propionate (FLONASE) 50 mcg/actuation nasal spray Use 2  sprays into each nostril daily. 16 g 11    magnesium oxide (MAG-OX) 400 mg (241.3 mg elemental magnesium) tablet Take 2 tablets (800 mg total) by mouth at bedtime. 180 tablet 3    metFORMIN (GLUCOPHAGE) 1000 MG tablet Take 1 tablet (1,000 mg total) by mouth Two (2) times a day. 180 tablet 3    montelukast (SINGULAIR) 10 mg tablet Take 1 tablet (10 mg total) by mouth daily. 90 tablet 3    pantoprazole (PROTONIX) 40 MG tablet Take 1 tablet (40 mg total) by mouth daily. 90 tablet 3    predniSONE (DELTASONE) 5 MG tablet Take 1.5 tablets (7.5 mg total) by mouth daily. 135 tablet 3    sildenafiL, pulm.hypertension, (REVATIO) 20 mg tablet Take 2-3 pills as needed (up to 60 mg) as needed for erectile dysfunction 60 tablet 3    SITagliptin phosphate (JANUVIA) 100 MG tablet Take 1 tablet (100 mg total) by mouth daily. 90 tablet 3 sulfamethoxazole-trimethoprim (BACTRIM) 400-80 mg per tablet Take 1 tablet (80 mg of trimethoprim total) by mouth every Monday, Wednesday, and Friday. 36 tablet 3    tacrolimus (PROGRAF) 0.5 MG capsule Take 2 capsules (1 mg total) by mouth two (2) times a day. 360 capsule 3    valGANciclovir (VALCYTE) 450 mg tablet Take 1 tablet (450 mg total) by mouth daily. 30 tablet 11     No facility-administered encounter medications on file as of 08/26/2023.       Immunosuppression:  Induction agent: basiliximab    Current immunosuppression:  tacrolimus 1mg  AM/ 2mg  PM (per pt report; differs from last dose documented by coordinator,  1 mg BID)   Prograf goal:  5-7  (lower goal given CKD, no signs of rejection, tolerating imuran)  Azathioprine 50 mg daily    changed from MMF 2/2 diarrhea; dose decreased from 100mg  to 75mg  on 08/01/20 given down-trending ANC and IgG   dose decrease to 50mg  on 10/03/20   prednisone 7.5 mg daily    Patient is tolerating immunosuppression well. Of note, A1c today is increased from last and above goal.     IMMUNOSUPPRESSION DRUG LEVELS:  Lab Results   Component Value Date    Tacrolimus, Trough 7.0 07/29/2023    Tacrolimus, Trough 4.5 (L) 07/15/2023    Tacrolimus, Trough 9.1 07/08/2023     Tacrolimus level is accurate 12 hour trough. Took last dose yesterday night ~8PM.    Graft function assessment via PFTs: stable  DSA:  NTD, last checked 05/27/23  Biopsies to date: none  WBC/ANC:  wnl      Plan: Will decrease prednisone to 5 mg to aid in glycemic control in the setting of stable PFTs, negative DSAs, and low concern for rejection. Pending tac level.    OI prophylaxis:   CMV Status: D+/ R-, high risk. CMV prophylaxis with valganciclovir 450 mg daily indefinitely. Negative VL 12/17/2022. T cell assay with low immunity 09/2022.   Estimated Creatinine Clearance: 46.2 mL/min (A) (based on SCr of 1.33 mg/dL (H)).  PCP: Prophylaxis with bactrim SS 1 tab MWF indefinitely.  Fungal: Completed posaconazole 300 mg daily for prophylaxis x 3 months per protocol - COMPLETE (08/20/20)    Patient is tolerating infectious prophylaxis well.    Plan: continue to monitor.     Infectious History:  Patient does not have a history of pathogenic infectious complications following lung transplant. 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 Previous antiinfective history use includes meropenem x7 days for broad spectrum gram negative and  aspiration PNA coverage post-op per ICID.     Plan: Continue to monitor.    BOS/CLAD prophylaxis:  Current meds include: azithromycin  250 mg daily, montelukast  10 mg, and pantoprazole  40 mg daily, Advair  inhaler  Esophageal motility study on 07/23/20 showed abnormal distal esophagus exposure during recumbent posture and overall borderline normal. 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 Patient denies any reflux symptoms with every day dosing.     Plan: Continue to monitor.     CV Prophylaxis:   Aspirin : asa 81 mg daily  The 10-year ASCVD risk score (Arnett DK, et al., 2019) is: 52.4%  Statin: atorvastatin  40 mg daily - pt tolerated switch from pravastatin  well  Lipid panel 11/19/22: LDL of 46 --> within goal    Plan: Continue to monitor. Consider repeat LDL at next visit    BP/Edema: Today in clinic BP 176/90 (Goal <130/80)    Encounter vitals reported above. Recent clinic BP consistently above goal.  Home BP ranges (checks couple of times weekly): 130s-140s/70s  Weights: stable  Meds include: none   Plan: BP out of goal. Continue to monitor    Anemia:  H/H:   Lab Results   Component Value Date    HGB 10.9 (L) 08/26/2023     Lab Results   Component Value Date    HCT 33.6 (L) 08/26/2023     Iron  panel:  Lab Results   Component Value Date    IRON  64 (L) 08/27/2022    TIBC 351 08/27/2022    FERRITIN 20.0 08/27/2022     Lab Results   Component Value Date    Iron  Saturation (%) 18 (L) 08/27/2022     Prior anemia therapy: IV Ferrlecit (1/10-1/13), IV iron  sucrose (1000 mg in 14 day period) completed 09/15/21    Plan: continue to monitor      DM:   Lab Results   Component Value Date    A1C 8.0 (H) 08/26/2023   Today's A1c: 8% from 5.9 (11/2022) (Goal A1c < 7%)     Current meds include:  Metformin  1000 mg PO BID  Januvia  100 mg PO daily  Home BS log: FBG 130s; PPBG: 140s - 170s (highest 277 mg/dl)  Hypoglycemia: no      Patient reports consuming starchy and carbohydrate-heavy foods (pasta, potatoes) and sweets (birthday cake) but recognizes importance of managing sugars through lifestyle.    Plan:  Encouraged patient to work on lifestyle modifications (diet, exercise) to help with glycemic control. Continue to monitor on current regimen and with dose reduced prednisone  5 mg.  Consider Jardiance 10 mg in the future as an anti-hyperglycemic agent with cardio-protective benefits.    Electrolytes: wnl  Current meds include: MagOx 800mg  QHS  Plan: continue    Bone health:  Patient currently on long-term steroid therapy.  Vitamin D Level:  81.3 (12/10/22) . Goal > 30.   Last DEXA results (11/19/2022): +1.5% at hip, +2.1% femoral neck   Current meds include: vitamin D3 1000 units daily, calcium  carbonate 600 mg BID, alendronate  70 mg weekly    Plan: Continue current therapy. Repeat vit.D levels. Continue to monitor.    Women's/Men's Health:  Rishith Siddoway. is a 73 y.o. male. Patient reports  history of BPH . Reports that BPH symptoms are controlled.  Current meds include: None  -terazosin  3 mg PO nightly (increased 09/05/20 given increased BPH symptoms, held at last visit and recommended to stop by urology on 12/31/22)  Plan:  Continue to monitor.    Adherence:   Patient has good understanding of medications.  Patient does fill their own pill box on a regular basis at home. Wife had previously been filing for him, but he now handles it on his own for the most part.   Patient brought medication card: yes  Pill box: did not bring  Patient requested refills for the following meds: None  Corrections needed in Epic medication list: none    Plan: Provided basic adherence counseling/intervention    I spent a total of 30 minutes face to face with the patient delivering clinical care and providing education/counseling.    Patient was reviewed with Dr.Coakley who were in agreement with the stated plan.    During this visit, the following was completed:   BG log data assessment  Labs ordered and evaluated  complex treatment plan >1 DS   Patient education was completed for 11-24 minutes     All questions/concerns were addressed to the patient's satisfaction.  __________________________________________  PATIENT SEEN AND EVALUATED BY:  Clayborn Cunas, PharmD, PharmD    Lonita Roach, CPP

## 2023-08-26 NOTE — Unmapped (Signed)
 Please see patient pharmacy visit for documentation.

## 2023-08-26 NOTE — Unmapped (Signed)
 Pulmonary Transplant Clinic    HISTORY:   HPI:  Mr. Spencer Pena is a 73 y.o.  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    Interval events 08/26/23:  - No dyspnea, cough, fever, chills, sore throat. 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     --> basiliximab induction    --> CMV high risk (D+/R-)    --> EBV moderate risk (D+/R+)    --> Not Increased Risk Donor  - Colon Cancer 2007    --> s/p resection of liver mets    --> s/p resection of 6 inches of colon    --> FOLFOX Chemo  - type 2 diabetes mellitus     --> not insulin  dependent  - OA  - Bicept tear    SOCIAL HISTORY:   - 20pk years, quit in 1991  - previously drank about 10 drinks per week   - Worked maintenance at [employer]  - Married and lives in [place]    FAMILY HISTORY:   - Parents lived into late 33's  - Father had colon cancer  - No siblings had colon cancer    MEDS: (personally reviewed in EPIC, pertinent meds noted below)         OBJECTIVE DATA:   PHYSICAL EXAM:  BP 176/90  - Pulse 78  - Temp 36.2 ??C (97.2 ??F) (Tympanic)  - Ht 170.2 cm (5' 7.01)  - Wt 78 kg (172 lb)  - SpO2 98%  - BMI 26.93 kg/m??   Gen: - awake, alert, in NAD, very pleasant, upbeat  Vascular: good pulses throughout  CV: regular rate rhythm  Pulm: good air movement bilaterally, normal work of breathing  Abd: non distended  Ext: no LE edema, dose have deformity of RUE bicep, swelling consistent with tendon rupture  Neuro: Alert, oriented, follows commands. Moving all extremities spontaneously    LABS: (reviewed in Epic, pertinent values noted below) PFTs: (personally reviewed and interpereted):    Date: FVC (% Pred) FEV1 (% Pred) FEF25-75(% Pred) DLCO TBBx/Results           08/26/23 4.14 (122) 3.58 (139) 5.11 (258)     05/27/23 4.10 3.63      02/25/23 4.08 (120%) 3.55 (137%) 87 (112%)     11/19/22  4.11 3.68      08/27/22 4.20 (123%) 3.67 (141%) 5.59 (276.9%)     05/28/22 4.16 (121.5%) 3.69 (141.4%) 3.23 (307.1%)     02/19/2022 4.03 (110.4) 3.52 (126.7) 5.19 (241.3)     11/17/21 4.13 (113%) 3.63 (130%)      08/18/21 4.01 (109%) 3.55 (127%)      05/15/2021 4.21(114%) 3.65 (130.3%) 5.54 (254%)     04/06/2021 4.08 (111%) 3.64 (129.6%) 6.01 (274.3%)     12/31/20 3.95 (107.1%) 3.46 (122.6%) 5.20 (236.4%)     12/03/2020 3.87 (104.9%) 3.47 (122.9%) 5.69 (258.3%)     10/03/2020 3.68 (99.4) 3.13 (110.6)  4.22 (190.6)      09/05/20  3.70  3.14      08/20/20 3.65 (  98%) 3.15 (111%) 4.69 (211%)      08/01/20 3.64 (98.2%) 3.10 (109.3%) 4.41 (198.8%)     07/18/20 3.49 (94%) 2.98 (105%) 4.20 (189%)     07/04/20 3.43 (92.5%) 2.97 (104.7%) 4.26 (191.7%)     06/27/2020 3.13 84.5 2.70 95.4 3.51 157.8     06/20/20 3.39 (91.4%) 2.82 (99.2%) 3.34 (150.35)     06/13/20 3.10 (72.8%) 2.66 (82.5%) 3.34 (136.2%)      05/15/20      BOLT    03/11/20  2.75 (72%)  2.36 (81%)       02/12/20  2.85 (75%)  2.44 (86%)                IMAGING: Reviewed in EMR, CXR with broken sternal wires, otherwise stable    CXR (personally reviewed and interpreted)  - CXR stable, no acute process    ASSESSMENT and PLAN     Mr. Spencer Pena is a 73 y.o. man with IPF and emphysema s/p BOLT on 05/15/2020, also with type 2 diabetes mellitus, hypertension, colon cancer in 2007 s/p partial colon and hepatic wedge resection and FOLFOX here for transplant complicaitons    Graft Function and Immunosuppression:  - PFTs and CXR are stable, feels well  - No DSAs, checked quarterly, last 06/2022, pending today  - Total IgG 731 (02/2023)  - Immunosuppression:     --> Continue tac goal 5-7. Currently taking 1mg  / 2mg  [ ]  Need to follow up level    --> AZA 50 mg daily (lower dose due to pancytopenia); no MMF due to GI upset    --> DECREASE Prednisone  7.5mg  daily (stopped 08/26/23) --> START PREDNISONE  5mg  DAILY esp in setting of high A1c  - BOS PPX    --> Azithromycin  250mg  daily    --> ASA 81mg  daily    --> Continue atorvastatin  02/2022    --> Continue Advair  BID    --> Montelukast  10mg  daily    Antimicrobial Prophylaxis:  - CMV, high risk (D+/R-): valganciclovir  450mg  daily indefinitely for Estimated Creatinine Clearance: 46.2 mL/min (A) (based on SCr of 1.33 mg/dL (H)).   Negative VL 12/29/2021. T cell assay with low immunity 09/2022, will repeat May 2025  - EBV (D+/R+): negative 12/29/21  - Fungal: Posaconazole  stopped at 23mo  - PCP: Bactrim  SS MWF    Hypertension  - amlodipine  5mg  every day    CKD IIIa   - sCr stable     OSA   - STOP-BANG 4 points (male, age, observed apnea, snoring).  - PSG 12/29/2020   CPAP at level of 10 cm H2O with expiratory pressure relief (EPR) = 3 and with heated humidifier for nasal dryness, mask: AirFit N20 size medium or mask of patient???s preference.  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  - pH probe with DeMeester 16.7%, slightly in abnormal range but will hold off on surgical intervention at this time in the setting of normal graft function.  - Daily PPI   - no reflux symptoms, no difficulty swallowing    BPH   - terazosin  3mg  at bedtime  - UA completed at prior appt, benign.  - Normal CEA and PSA  - Follows with urology  Iron  Deficiency Anemia   - receives periodic iron  infusions    Hypomagnesemia   - magnesium  oxide 800 mg BID    Type 2 diabetes mellitus   - metformin  1000mg  BID  - Januvia  100  - PVL ABI and carotid duplex normal 11/2021  - fasting BG 110-120  - Last A1c (08/26/23) 8.0; discussed dietary changes; further education with dietician today 08/26/23    Bone Health:   - Last Dexa: low bone mass on DEXA 11/2022, +1.5% at hip, +2.1% femoral neck  - on Ca and Vit D supplementation currently  - alendronate  70mg  weekly    H/o colon cancer in 2007 s/p partial colon and hepatic wedge resection and FOLFOX  Last colonoscopy 08/10/23 with 5 polyps (adenocarcinoma)  - Will need repeat colonoscopy; will touch base with GI re: recommendations in setting of his prior history of colon cancer and transplant    Health Maintenance:  - Last derm visit:  12/2021  - Last Colonsocopy (if >40):   08/2023 (5 polyps, adenoma), need repeat TBD  - Last flu shot:   02/19/2022  - RSV vaccine:   Completed locally 06/2022  - Last Pneumovax:  06/10/2004, 12/30/2017  - Last Prevnar:  03/25/2016  - TdaP:   02/11/2018  - Shingrix:    03/10/20, 08/20/20  - COVID-19 (Moderna) 06/11/19, 07/09/19, 03/10/20, 07/18/2020, 12/03/20 received 1/2 dose Evushield,03/2021 (Bivalent); SpikeVax 02/19/2022 and 08/27/22, 02/2023. 08/26/23  - Last Dexa:   02/2020, 11/17/21 -> T-score -2.2 FRAX score 8%, July 2024 FRAX   - Last vitamin D:  66.9 05/2021, recheck 08/26/23.  - Last HbA1c:   (08/26/23) 8.0     Complex medical decision making was done as we adjust medications based on blood work/drug levels and multiple complaints and problems were addressed. Patient was seen and discussed by Dr. Marene Shape.     Dayzee Trower Raenell Bump, DO  Pulmonary and Critical Care Fellow

## 2023-08-26 NOTE — Unmapped (Signed)
 Called Spencer Bur Sr. to discuss lab results. Tacrolimus  level was increased at 7.7 with a goal of 5-7. Medication regimen is currently 1/2. Discussed with [name], CPP dosing at this time decreased to  1 mg BID. Repeat labs scheduled two weeks. Resulted labs reviewed. All questions answered. Pt verbalized understanding.

## 2023-08-26 NOTE — Unmapped (Signed)
 Per provider, the patient received 1 vaccine.  Patient ID verified with name and date of birth.  All screening questions were answered.  Vaccine were administered as ordered.  See immunization history for documentation.  Patient tolerated the injection well with no issues noted.  Vaccine Information sheet given to the patient.

## 2023-08-26 NOTE — Unmapped (Signed)
 Seen in clinic with Dr. Marene Shape.    Plan of Care  PFTs stable.  Elevated HA1C. Will decrease Prednisone  from 7.5 mg to 5 mg. Will consider adding in Jardiance in the future if no improvement with HA1C with decreased Pred dose.  COVID Booster today.  RTC in 3 months.    All questions answered. Patient verbalized understanding.

## 2023-08-27 LAB — EBV QUANTITATIVE PCR, BLOOD: EBV VIRAL LOAD RESULT: NOT DETECTED

## 2023-08-27 LAB — CMV DNA, QUANTITATIVE, PCR
CMV QUANT: <35 [IU]/mL — ABNORMAL HIGH (ref <0)
CMV VIRAL LD: DETECTED — AB

## 2023-08-30 LAB — VITAMIN D 25 HYDROXY: VITAMIN D, TOTAL (25OH): 53.5 ng/mL (ref 20.0–80.0)

## 2023-09-02 LAB — HLA DS POST TRANSPLANT
ANTI-DONOR DRW #1 MFI: 0 MFI
ANTI-DONOR DRW #2 MFI: 0 MFI
ANTI-DONOR HLA-A #1 MFI: 0 MFI
ANTI-DONOR HLA-A #2 MFI: 0 MFI
ANTI-DONOR HLA-B #1 MFI: 0 MFI
ANTI-DONOR HLA-B #2 MFI: 0 MFI
ANTI-DONOR HLA-C #2 MFI: 0 MFI
ANTI-DONOR HLA-DP AG #1 MFI: 0 MFI
ANTI-DONOR HLA-DQB #1 MFI: 433 MFI
ANTI-DONOR HLA-DQB #2 MFI: 0 MFI
ANTI-DONOR HLA-DR #1 MFI: 0 MFI
ANTI-DONOR HLA-DR #2 MFI: 0 MFI

## 2023-09-02 LAB — FSAB CLASS 2 ANTIBODY SPECIFICITY: HLA CL2 AB RESULT: POSITIVE

## 2023-09-02 LAB — FSAB CLASS 1 ANTIBODY SPECIFICITY: HLA CLASS 1 ANTIBODY RESULT: NEGATIVE

## 2023-09-02 MED FILL — ATORVASTATIN 40 MG TABLET: ORAL | 90 days supply | Qty: 90 | Fill #2

## 2023-09-02 MED FILL — MAGNESIUM OXIDE 400 MG (241.3 MG MAGNESIUM) TABLET: ORAL | 60 days supply | Qty: 120 | Fill #2

## 2023-09-02 MED FILL — CALCIUM 600 MG (AS CALCIUM CARBONATE 1,500 MG) TABLET: ORAL | 30 days supply | Qty: 60 | Fill #6

## 2023-09-06 NOTE — Unmapped (Signed)
Therapy Update Follow Up: No issues - Copay = $24 for 90ds

## 2023-09-14 MED ORDER — AMLODIPINE 5 MG TABLET
ORAL_TABLET | Freq: Every day | ORAL | 11 refills | 30.00000 days | Status: CP
Start: 2023-09-14 — End: 2024-09-13
  Filled 2023-09-21: qty 90, 90d supply, fill #0

## 2023-09-14 NOTE — Unmapped (Signed)
 The patient is requesting a medication refill

## 2023-09-16 ENCOUNTER — Ambulatory Visit: Admit: 2023-09-16 | Discharge: 2023-09-17 | Payer: Medicare (Managed Care)

## 2023-09-16 LAB — CBC W/ AUTO DIFF
BASOPHILS ABSOLUTE COUNT: 0 10*9/L (ref 0.0–0.1)
BASOPHILS RELATIVE PERCENT: 0.8 %
EOSINOPHILS ABSOLUTE COUNT: 0.1 10*9/L (ref 0.0–0.5)
EOSINOPHILS RELATIVE PERCENT: 3.1 %
HEMATOCRIT: 31.9 % — ABNORMAL LOW (ref 39.0–48.0)
HEMOGLOBIN: 10.7 g/dL — ABNORMAL LOW (ref 12.9–16.5)
LYMPHOCYTES ABSOLUTE COUNT: 0.7 10*9/L — ABNORMAL LOW (ref 1.1–3.6)
LYMPHOCYTES RELATIVE PERCENT: 17.9 %
MEAN CORPUSCULAR HEMOGLOBIN CONC: 33.4 g/dL (ref 32.0–36.0)
MEAN CORPUSCULAR HEMOGLOBIN: 25.5 pg — ABNORMAL LOW (ref 25.9–32.4)
MEAN CORPUSCULAR VOLUME: 76.2 fL — ABNORMAL LOW (ref 77.6–95.7)
MEAN PLATELET VOLUME: 7.1 fL (ref 6.8–10.7)
MONOCYTES ABSOLUTE COUNT: 0.4 10*9/L (ref 0.3–0.8)
MONOCYTES RELATIVE PERCENT: 9.9 %
NEUTROPHILS ABSOLUTE COUNT: 2.8 10*9/L (ref 1.8–7.8)
NEUTROPHILS RELATIVE PERCENT: 68.3 %
NUCLEATED RED BLOOD CELLS: 0 /100{WBCs} (ref <=4)
PLATELET COUNT: 185 10*9/L (ref 150–450)
RED BLOOD CELL COUNT: 4.19 10*12/L — ABNORMAL LOW (ref 4.26–5.60)
RED CELL DISTRIBUTION WIDTH: 18 % — ABNORMAL HIGH (ref 12.2–15.2)
WBC ADJUSTED: 4.1 10*9/L (ref 3.6–11.2)

## 2023-09-16 LAB — PHOSPHORUS: PHOSPHORUS: 3.6 mg/dL (ref 2.4–5.1)

## 2023-09-16 LAB — COMPREHENSIVE METABOLIC PANEL
ALBUMIN: 3.7 g/dL (ref 3.4–5.0)
ALKALINE PHOSPHATASE: 99 U/L (ref 46–116)
ALT (SGPT): 18 U/L (ref 10–49)
ANION GAP: 13 mmol/L (ref 5–14)
AST (SGOT): 24 U/L (ref <=34)
BILIRUBIN TOTAL: 0.4 mg/dL (ref 0.3–1.2)
BLOOD UREA NITROGEN: 22 mg/dL (ref 9–23)
BUN / CREAT RATIO: 19
CALCIUM: 10.2 mg/dL (ref 8.7–10.4)
CHLORIDE: 103 mmol/L (ref 98–107)
CO2: 30 mmol/L (ref 20.0–31.0)
CREATININE: 1.16 mg/dL (ref 0.73–1.18)
EGFR CKD-EPI (2021) MALE: 67 mL/min/1.73m2 (ref >=60)
GLUCOSE RANDOM: 113 mg/dL (ref 70–179)
POTASSIUM: 3.9 mmol/L (ref 3.4–4.8)
PROTEIN TOTAL: 6.7 g/dL (ref 5.7–8.2)
SODIUM: 146 mmol/L — ABNORMAL HIGH (ref 135–145)

## 2023-09-16 LAB — IGG: GAMMAGLOBULIN; IGG: 757 mg/dL (ref 650–1600)

## 2023-09-16 LAB — MAGNESIUM: MAGNESIUM: 1.7 mg/dL (ref 1.6–2.6)

## 2023-09-16 LAB — TACROLIMUS LEVEL, TROUGH: TACROLIMUS, TROUGH: 5.6 ng/mL (ref 5.0–15.0)

## 2023-09-16 NOTE — Unmapped (Signed)
 Called Spencer Bur Sr. to discuss lab results. Tacrolimus level was normal at 5.6 with a goal of 5-7. Medication regimen is currently 1 mg BID. No changes made, level within normal range.  dosing at this time to remain the same. Repeat labs scheduled monthly. Resulted labs reviewed. All questions answered. Pt verbalized understanding.

## 2023-09-19 NOTE — Unmapped (Signed)
 [facility] Refill Coordination Note    Specialty Medication(s) to be Shipped:   Transplant: Valcyte 450mg     Other medication(s) to be shipped: amlodipine 5 MG tablet (NORVASC)     Spencer Bur Sr., DOB: 10-16-1950  Phone: [phone] (home)       All above HIPAA information was verified with patient.     Was a Nurse, learning disability used for this call? No    Completed refill call assessment today to schedule patient's medication shipment from the [facility]  [phone]).  All relevant notes have been reviewed.     Specialty medication(s) and dose(s) confirmed: Regimen is correct and unchanged.   Changes to medications: [place] reports no changes at this time.  Changes to insurance: No  New side effects reported not previously addressed with a pharmacist or physician: None reported  Questions for the pharmacist: No    Confirmed patient received a Conservation officer, historic buildings and a Surveyor, mining with first shipment. The patient will receive a drug information handout for each medication shipped and additional FDA Medication Guides as required.       DISEASE/MEDICATION-SPECIFIC INFORMATION        N/A    SPECIALTY MEDICATION ADHERENCE     Medication Adherence    Patient reported X missed doses in the last month: 0  Specialty Medication: valGANciclovir 450 mg tablet (VALCYTE)  Patient is on additional specialty medications: No  Patient is on more than two specialty medications: No              Were doses missed due to medication being on hold? No    valGANciclovir 450 mg tablet (VALCYTE)  : 4 days of medicine on hand       REFERRAL TO PHARMACIST     Referral to the pharmacist: Not needed      [facility]     Shipping address confirmed in Epic.     Cost and Payment: Patient has a copay of $24 on valcyte , could not determine amlodipine charge due to rfts until 5/13, pt ok'd up to $30 charge . They are aware and have authorized the pharmacy to charge the credit card on file.    Delivery Scheduled: Yes, Expected medication delivery date: 09/21/23.     Medication will be delivered via Same Day Courier to the prescription address in Epic WAM.    Spencer Pena   [facility]  Specialty Technician

## 2023-09-21 MED FILL — VALGANCICLOVIR 450 MG TABLET: ORAL | 60 days supply | Qty: 60 | Fill #1

## 2023-09-30 MED ORDER — METFORMIN 1,000 MG TABLET
ORAL_TABLET | Freq: Two times a day (BID) | ORAL | 3 refills | 90.00000 days | Status: CP
Start: 2023-09-30 — End: 2024-09-29
  Filled 2023-10-12: qty 180, 90d supply, fill #0

## 2023-09-30 MED ORDER — SITAGLIPTIN PHOSPHATE 100 MG TABLET
ORAL_TABLET | Freq: Every day | ORAL | 3 refills | 90.00000 days | Status: CP
Start: 2023-09-30 — End: 2024-09-29
  Filled 2023-10-04: qty 90, 90d supply, fill #0

## 2023-09-30 NOTE — Unmapped (Signed)
 [facility] Refill Coordination Note    Specialty Medication(s) to be Shipped:   Transplant: tacrolimus 0.5 mg    Other medication(s) to be shipped: acetaminophen, calcium, vitamin d , magnesium, metformin and januvia      Spencer LODGE Sr., DOB: [birth year]  Phone: [phone] (home)       All above HIPAA information was verified with patient.     Was a Nurse, learning disability used for this call? No    Completed refill call assessment today to schedule patient's medication shipment from the [facility]  [phone]).  All relevant notes have been reviewed.     Specialty medication(s) and dose(s) confirmed: Regimen is correct and unchanged.   Changes to medications: Cadell reports no changes at this time.  Changes to insurance: No  New side effects reported not previously addressed with a pharmacist or physician: None reported  Questions for the pharmacist: No    Confirmed patient received a Conservation officer, historic buildings and a Surveyor, mining with first shipment. The patient will receive a drug information handout for each medication shipped and additional FDA Medication Guides as required.       DISEASE/MEDICATION-SPECIFIC INFORMATION        N/A    SPECIALTY MEDICATION ADHERENCE     Medication Adherence    Patient reported X missed doses in the last month: 0  Specialty Medication: tacrolimus 0.5 MG capsule (PROGRAF)  Patient is on additional specialty medications: No              Were doses missed due to medication being on hold? No    tacrolimus 0.5 MG capsule (PROGRAF) 14 days of medicine on hand     REFERRAL TO PHARMACIST     Referral to the pharmacist: Not needed      [facility]     Shipping address confirmed in Epic.     Cost and Payment: Patient has a copay of $123.20. They are aware and have authorized the pharmacy to charge the credit card on file.    Delivery Scheduled: Yes, Expected medication delivery date: 10/05/2023.     Medication will be delivered via Same Day Courier to the prescription address in Epic WAM.    [facility]  Specialty Technician

## 2023-10-04 MED ORDER — CHOLECALCIFEROL (VITAMIN D3) 25 MCG (1,000 UNIT) TABLET
ORAL_TABLET | Freq: Every day | ORAL | 3 refills | 100.00000 days | Status: CP
Start: 2023-10-04 — End: 2024-10-03
  Filled 2023-10-12: qty 100, 100d supply, fill #0

## 2023-10-04 MED FILL — MAGNESIUM OXIDE 400 MG (241.3 MG MAGNESIUM) TABLET: ORAL | 60 days supply | Qty: 120 | Fill #3

## 2023-10-04 MED FILL — CALCIUM 600 MG (AS CALCIUM CARBONATE 1,500 MG) TABLET: ORAL | 30 days supply | Qty: 60 | Fill #7

## 2023-10-04 MED FILL — ACETAMINOPHEN 500 MG TABLET: ORAL | 23 days supply | Qty: 180 | Fill #2

## 2023-10-21 ENCOUNTER — Ambulatory Visit: Admit: 2023-10-21 | Discharge: 2023-10-22 | Payer: Medicare (Managed Care)

## 2023-10-21 LAB — COMPREHENSIVE METABOLIC PANEL
ALBUMIN: 3.8 g/dL (ref 3.4–5.0)
ALKALINE PHOSPHATASE: 84 U/L (ref 46–116)
ALT (SGPT): 18 U/L (ref 10–49)
ANION GAP: 11 mmol/L (ref 5–14)
AST (SGOT): 25 U/L (ref <=34)
BILIRUBIN TOTAL: 0.3 mg/dL (ref 0.3–1.2)
BLOOD UREA NITROGEN: 18 mg/dL (ref 9–23)
BUN / CREAT RATIO: 16
CALCIUM: 8.5 mg/dL — ABNORMAL LOW (ref 8.7–10.4)
CHLORIDE: 105 mmol/L (ref 98–107)
CO2: 28.6 mmol/L (ref 20.0–31.0)
CREATININE: 1.1 mg/dL (ref 0.73–1.18)
EGFR CKD-EPI (2021) MALE: 71 mL/min/1.73m2 (ref >=60)
GLUCOSE RANDOM: 129 mg/dL (ref 70–179)
POTASSIUM: 4.1 mmol/L (ref 3.4–4.8)
PROTEIN TOTAL: 6.4 g/dL (ref 5.7–8.2)
SODIUM: 145 mmol/L (ref 135–145)

## 2023-10-21 LAB — CBC W/ AUTO DIFF
BASOPHILS ABSOLUTE COUNT: 0 10*9/L (ref 0.0–0.1)
BASOPHILS RELATIVE PERCENT: 0.8 %
EOSINOPHILS ABSOLUTE COUNT: 0.1 10*9/L (ref 0.0–0.5)
EOSINOPHILS RELATIVE PERCENT: 2.4 %
HEMATOCRIT: 31.8 % — ABNORMAL LOW (ref 39.0–48.0)
HEMOGLOBIN: 10.6 g/dL — ABNORMAL LOW (ref 12.9–16.5)
LYMPHOCYTES ABSOLUTE COUNT: 0.7 10*9/L — ABNORMAL LOW (ref 1.1–3.6)
LYMPHOCYTES RELATIVE PERCENT: 15 %
MEAN CORPUSCULAR HEMOGLOBIN CONC: 33.2 g/dL (ref 32.0–36.0)
MEAN CORPUSCULAR HEMOGLOBIN: 25.5 pg — ABNORMAL LOW (ref 25.9–32.4)
MEAN CORPUSCULAR VOLUME: 76.7 fL — ABNORMAL LOW (ref 77.6–95.7)
MEAN PLATELET VOLUME: 7 fL (ref 6.8–10.7)
MONOCYTES ABSOLUTE COUNT: 0.4 10*9/L (ref 0.3–0.8)
MONOCYTES RELATIVE PERCENT: 9.3 %
NEUTROPHILS ABSOLUTE COUNT: 3.2 10*9/L (ref 1.8–7.8)
NEUTROPHILS RELATIVE PERCENT: 72.5 %
NUCLEATED RED BLOOD CELLS: 0 /100{WBCs} (ref <=4)
PLATELET COUNT: 174 10*9/L (ref 150–450)
RED BLOOD CELL COUNT: 4.14 10*12/L — ABNORMAL LOW (ref 4.26–5.60)
RED CELL DISTRIBUTION WIDTH: 18.6 % — ABNORMAL HIGH (ref 12.2–15.2)
WBC ADJUSTED: 4.4 10*9/L (ref 3.6–11.2)

## 2023-10-21 LAB — MAGNESIUM: MAGNESIUM: 2 mg/dL (ref 1.6–2.6)

## 2023-10-21 LAB — PHOSPHORUS: PHOSPHORUS: 1.4 mg/dL — ABNORMAL LOW (ref 2.4–5.1)

## 2023-10-21 LAB — TACROLIMUS LEVEL, TROUGH: TACROLIMUS, TROUGH: 4.2 ng/mL — ABNORMAL LOW (ref 5.0–15.0)

## 2023-10-21 LAB — IGG: GAMMAGLOBULIN; IGG: 706 mg/dL (ref 650–1600)

## 2023-10-21 NOTE — Unmapped (Signed)
 [name] spoke with Dru in person today to follow up on labs. Tacrolimus level was decreased at 4.2 with a goal of 5-7. Medication regimen is currently 1 mg BID. Discussed with [name], CPP dosing at this time increased to 1/1.5. Repeat labs scheduled in 1 week. Resulted labs reviewed. All questions answered. Pt verbalized understanding.

## 2023-10-24 ENCOUNTER — Ambulatory Visit: Admit: 2023-10-24 | Discharge: 2023-10-25 | Payer: Medicare (Managed Care)

## 2023-10-24 DIAGNOSIS — J029 Acute pharyngitis, unspecified: Principal | ICD-10-CM

## 2023-10-24 DIAGNOSIS — Z942 Lung transplant status: Principal | ICD-10-CM

## 2023-10-24 MED ORDER — TACROLIMUS 0.5 MG CAPSULE, IMMEDIATE-RELEASE
ORAL_CAPSULE | ORAL | 3 refills | 90.00000 days | Status: CP
Start: 2023-10-24 — End: 2024-10-23

## 2023-10-24 NOTE — Unmapped (Signed)
 Outgoing call to [employer] , rpp positive for paraflu, d/w Dr. Curlene and advised if he does not start feeling better or gets worse in next few days to call back.  Questions answered, verbalized understanding.

## 2023-10-24 NOTE — Unmapped (Signed)
 Incoming call from [employer] Sr.SABRA  He is congested and has a sore throat.  Will come to clinic this morning to get swabbed.

## 2023-10-24 NOTE — Unmapped (Addendum)
 [facility] Pharmacist has reviewed a new prescription for tacrolimus that indicates a dose increase.  Patient was counseled on this dosage change by coordinator [facility]- see epic note from 6/13.  Next refill call date adjusted if necessary.        Clinical Assessment Needed For: Dose Change  Medication: Tacrolimus 0.5 mg capsule   Last Fill Date/Day Supply: 10/04/23 / 90  Refill Too Soon until 12/03/23  Was previous dose already scheduled to fill: No    Notes to Pharmacist: Will retest

## 2023-10-26 NOTE — Unmapped (Signed)
 [name] Clinical Assessment & Refill Coordination Note    Spencer LECOMTE Sr., DOB: January 22, 1951  Phone: [phone] (home)     All above HIPAA information was verified with patient.     Was a Nurse, learning disability used for this call? No    Specialty Medication(s):   Transplant: azathioprine  50mg , Prednisone  5mg , tacrolimus  0.5mg , and valgancyclovir 450mg      Current Medications[1]     Changes to medications: Pawan reports no changes at this time.    Medication list has been reviewed and updated in Epic: Yes  Only high priority alerts noted on med rec today:  Alpha gal allergy with tacrolimus  and tylenol  - patient says ok with these meds  Nsaid allergy with aspirin  - patient says ok with these meds  clinic aware on both of these    Allergies[2]    Changes to allergies: No    Allergies have been reviewed and updated in Epic: Yes    SPECIALTY MEDICATION ADHERENCE     Azathioprine  50mg   : 21 days of medicine on hand    Prednisone  5mg  :  30days of medicine on hand   Tacrolimus  0.5mg   : 30 days of medicine on hand   Valganciclovir  450mg   : 21 days of medicine on hand       Medication Adherence    Patient reported X missed doses in the last month: 0  Specialty Medication: azathioprine  50mg   Patient is on additional specialty medications: Yes  Additional Specialty Medications: Prednisone  5mg   Patient Reported Additional Medication X Missed Doses in the Last Month: 0  Patient is on more than two specialty medications: Yes  Specialty Medication: tacrolimus  0.5mg   Patient Reported Additional Medication X Missed Doses in the Last Month: 0  Specialty Medication: valganciclovir  450mg   Patient Reported Additional Medication X Missed Doses in the Last Month: 0          Specialty medication(s) dose(s) confirmed: Patient reports changes to the regimen as follows: tac is now 2 caps (1mg  total) in am and 3 caps (1.5mg ) in pm- pt is aware and new rx is on file     Are there any concerns with adherence? No    Adherence counseling provided? Not needed    CLINICAL MANAGEMENT AND INTERVENTION      Clinical Benefit Assessment:    Do you feel the medicine is effective or helping your condition? Yes    Clinical Benefit counseling provided? Not needed    Adverse Effects Assessment:    Are you experiencing any side effects? No    Are you experiencing difficulty administering your medicine? No    Quality of Life Assessment:    Quality of Life    Rheumatology  Oncology  Dermatology  Cystic Fibrosis          How many days over the past month did your transplant  keep you from your normal activities? For example, brushing your teeth or getting up in the morning. 0    Have you discussed this with your provider? Not needed    Acute Infection Status:    Acute infections noted within Epic:  Parainfluenza virus    Patient reported infection: None    Therapy Appropriateness:    Is therapy appropriate based on current medication list, adverse reactions, adherence, clinical benefit and progress toward achieving therapeutic goals? Yes, therapy is appropriate and should be continued     Clinical Intervention:    Was an intervention completed as part of  this clinical assessment? No    DISEASE/MEDICATION-SPECIFIC INFORMATION      N/A    Solid Organ Transplant: Not Applicable    PATIENT SPECIFIC NEEDS     Does the patient have any physical, cognitive, or cultural barriers? No    Is the patient high risk? No    Does the patient require physician intervention or other additional services (i.e., nutrition, smoking cessation, social work)? No    Does the patient have an additional or emergency contact listed in their chart? Yes    SOCIAL DETERMINANTS OF HEALTH     At the [facility], we have learned that life circumstances - like trouble affording food, housing, utilities, or transportation can affect the health of many of our patients.   That is why we wanted to ask: are you currently experiencing any life circumstances that are negatively impacting your health and/or quality of life? Patient declined to answer    Social Drivers of Health     Food Insecurity: No Food Insecurity (08/18/2021)    Hunger Vital Sign     Worried About Running Out of Food in the Last Year: Never true     Ran Out of Food in the Last Year: Never true   Tobacco Use: Medium Risk (08/26/2023)    Patient History     Smoking Tobacco Use: Former     Smokeless Tobacco Use: Former     Passive Exposure: Never   Transportation Needs: No Transportation Needs (08/18/2021)    PRAPARE - Therapist, art (Medical): No     Lack of Transportation (Non-Medical): No   Alcohol Use: Not At Risk (08/18/2021)    Alcohol Use     How often do you have a drink containing alcohol?: 2 - 3 times per week     How many drinks containing alcohol do you have on a typical day when you are drinking?: 1 - 2     How often do you have 5 or more drinks on one occasion?: Never   Housing: Not on file   Physical Activity: Sufficiently Active (08/18/2021)    Exercise Vital Sign     Days of Exercise per Week: 7 days     Minutes of Exercise per Session: 60 min   Utilities: Not on file   Stress: No Stress Concern Present (08/18/2021)    [employer] of Occupational Health - Occupational Stress Questionnaire     Feeling of Stress : Not at all   Interpersonal Safety: Patient Unable To Answer (08/26/2023)    Interpersonal Safety     Unsafe Where You Currently Live: Patient unable to answer     Physically Hurt by Anyone: Patient unable to answer     Abused by Anyone: Patient unable to answer   Substance Use: Not on file (03/14/2023)   Intimate Partner Violence: Not At Risk (08/18/2021)    Humiliation, Afraid, Rape, and Kick questionnaire     Fear of Current or Ex-Partner: No     Emotionally Abused: No     Physically Abused: No     Sexually Abused: No   Social Connections: Moderately Isolated (08/18/2021)    Social Connection and Isolation Panel     Frequency of Communication with Friends and Family: More than three times a week     Frequency of Social Gatherings with Friends and Family: Once a week     Attends Religious Services: Never     Production manager of [employer]  or Organizations: No     Attends Banker Meetings: Never     Marital Status: Married   Programmer, applications: Low Risk  (08/18/2021)    Overall Financial Resource Strain (CARDIA)     Difficulty of Paying Living Expenses: Not hard at all   Health Literacy: Medium Risk (08/18/2021)    Health Literacy     : Rarely   Internet Connectivity: Internet connectivity concern identified (08/18/2021)    Internet Connectivity     Do you have access to internet services: No     How do you connect to the internet: Not on file     Is your internet connection strong enough for you to watch video on your device without major problems?: Not on file     Do you have enough data to get through the month?: Not on file     Does at least one of the devices have a camera that you can use for video chat?: Not on file       Would you be willing to receive help with any of the needs that you have identified today? Not applicable       SHIPPING     Specialty Medication(s) to be Shipped:   N/a    Other medication(s) to be shipped: No additional medications requested for fill at this time     Changes to insurance: No    Cost and Payment: n/a    Delivery Scheduled: Patient declined refill at this time due to supply on hand, patient is aware next call is set up for 2 weeks out and to contact [facility] with any needs sooner/if down to 1 week on hand of any medications to avoid delays.     Medication will be delivered via n/a to the confirmed n/a address in [facility].    The patient will receive a drug information handout for each medication shipped and additional FDA Medication Guides as required.  Verified that patient has previously received a Conservation officer, historic buildings and a Surveyor, mining.    The patient or caregiver noted above participated in the development of this care plan and knows that they can request review of or adjustments to the care plan at any time.      All of the patient's questions and concerns have been addressed.    Ronal FORBES Reusing, PharmD   [facility] Specialty Pharmacist       [1]   Current Outpatient Medications   Medication Sig Dispense Refill    acetaminophen  (TYLENOL  EXTRA STRENGTH) 500 MG tablet Take 1-2 tablets (500-1,000 mg total) by mouth every six (6) hours as needed for pain. 180 tablet PRN    alendronate  (FOSAMAX ) 70 MG tablet Take 1 tablet (70 mg total) by mouth every seven (7) days. 12 tablet 3    amlodipine  (NORVASC ) 5 MG tablet Take 1 tablet (5 mg total) by mouth daily. 30 tablet 11    aspirin  (ECOTRIN) 81 MG tablet Take 1 tablet (81 mg total) by mouth daily. 90 tablet 3    atorvastatin  (LIPITOR) 40 MG tablet Take 1 tablet (40 mg total) by mouth daily. 90 tablet 3    azathioprine  (IMURAN ) 50 mg tablet Take 1 tablet (50 mg total) by mouth daily. 90 tablet 3    azithromycin  (ZITHROMAX ) 250 MG tablet Take 1 tablet (250 mg total) by mouth daily. 90 tablet 3    blood sugar diagnostic (ON CALL EXPRESS TEST STRIP)  Strp Test daily before all meals/snacks and once before bedtime. 100 each 11    blood-glucose meter kit Accu-Check meter and supplies 1 each 0    calcium  carbonate (CALCIUM  600) 1,500 mg (600 mg elem calcium ) tablet Take 1 tablet (600 mg of elem calcium  total) by mouth Two (2) times a day. 60 tablet 11    cholecalciferol , vitamin D3 25 mcg, 1,000 units,, 1,000 unit (25 mcg) tablet Take 1 tablet (25 mcg total) by mouth daily. 100 tablet 3    fluticasone  propion-salmeterol (ADVAIR  HFA) 115-21 mcg/actuation inhaler Inhale 2 puffs Two (2) times a day. 12 g 11    fluticasone  propionate (CUTIVATE ) 0.05 % cream Apply 1 Application topically daily as needed.      fluticasone  propionate (FLONASE ) 50 mcg/actuation nasal spray Use 2 sprays into each nostril daily. 16 g 11    magnesium  oxide (MAG-OX) 400 mg (241.3 mg elemental magnesium ) tablet Take 2 tablets (800 mg total) by mouth at bedtime. 180 tablet 3    metFORMIN  (GLUCOPHAGE ) 1000 MG tablet Take 1 tablet (1,000 mg total) by mouth Two (2) times a day. 180 tablet 3    montelukast  (SINGULAIR ) 10 mg tablet Take 1 tablet (10 mg total) by mouth daily. 90 tablet 3    pantoprazole  (PROTONIX ) 40 MG tablet Take 1 tablet (40 mg total) by mouth daily. 90 tablet 3    predniSONE  (DELTASONE ) 5 MG tablet Take 1 tablet (5 mg total) by mouth daily. 90 tablet 3    sildenafiL , pulm.hypertension, (REVATIO ) 20 mg tablet Take 2-3 pills as needed (up to 60 mg) as needed for erectile dysfunction 60 tablet 3    SITagliptin  phosphate (JANUVIA ) 100 MG tablet Take 1 tablet (100 mg total) by mouth daily. 90 tablet 3    sulfamethoxazole -trimethoprim  (BACTRIM ) 400-80 mg per tablet Take 1 tablet (80 mg of trimethoprim  total) by mouth every Monday, Wednesday, and Friday. 36 tablet 3    tacrolimus  (PROGRAF ) 0.5 MG capsule Take 2 capsules (1 mg total) by mouth daily AND 3 capsules (1.5 mg total) nightly. 450 capsule 3    valGANciclovir  (VALCYTE ) 450 mg tablet Take 1 tablet (450 mg total) by mouth daily. 30 tablet 11     No current facility-administered medications for this visit.   [2]   Allergies  Allergen Reactions    Cetuximab Anaphylaxis     Chest pain/rapid heart rate/ BP dropped    Enalapril       Angioedema      Venom-Honey Bee Anaphylaxis    Nsaids (Non-Steroidal Anti-Inflammatory Drug)      Interaction with transplant medications    Pollen Extracts Other (See Comments)     Nasal stuffiness, coughing, sneezing

## 2023-11-02 MED ORDER — AZATHIOPRINE 50 MG TABLET
ORAL_TABLET | Freq: Every day | ORAL | 3 refills | 90.00000 days | Status: CP
Start: 2023-11-02 — End: ?
  Filled 2023-11-07: qty 90, 90d supply, fill #0

## 2023-11-02 MED ORDER — ALENDRONATE 70 MG TABLET
ORAL_TABLET | ORAL | 3 refills | 84.00000 days | Status: CP
Start: 2023-11-02 — End: 2024-11-01
  Filled 2023-11-07: qty 12, 84d supply, fill #0

## 2023-11-02 NOTE — Unmapped (Signed)
 Pt request for RX Refill

## 2023-11-04 ENCOUNTER — Ambulatory Visit: Admit: 2023-11-04 | Discharge: 2023-11-05 | Payer: MEDICARE

## 2023-11-04 ENCOUNTER — Encounter: Admit: 2023-11-04 | Discharge: 2023-11-05 | Payer: MEDICARE | Attending: Internal Medicine | Primary: Internal Medicine

## 2023-11-04 LAB — COMPREHENSIVE METABOLIC PANEL
ALBUMIN: 3.8 g/dL (ref 3.4–5.0)
ALKALINE PHOSPHATASE: 57 U/L (ref 46–116)
ALT (SGPT): 16 U/L (ref 10–49)
ANION GAP: 13 mmol/L (ref 5–14)
AST (SGOT): 20 U/L (ref <=34)
BILIRUBIN TOTAL: 0.4 mg/dL (ref 0.3–1.2)
BLOOD UREA NITROGEN: 21 mg/dL (ref 9–23)
BUN / CREAT RATIO: 17
CALCIUM: 9.1 mg/dL (ref 8.7–10.4)
CHLORIDE: 102 mmol/L (ref 98–107)
CO2: 29.4 mmol/L (ref 20.0–31.0)
CREATININE: 1.25 mg/dL — ABNORMAL HIGH (ref 0.73–1.18)
EGFR CKD-EPI (2021) MALE: 61 mL/min/{1.73_m2} (ref >=60)
GLUCOSE RANDOM: 126 mg/dL (ref 70–179)
POTASSIUM: 4.2 mmol/L (ref 3.4–4.8)
PROTEIN TOTAL: 6.8 g/dL (ref 5.7–8.2)
SODIUM: 144 mmol/L (ref 135–145)

## 2023-11-04 LAB — CBC W/ AUTO DIFF
BASOPHILS ABSOLUTE COUNT: 0 10*9/L (ref 0.0–0.1)
BASOPHILS RELATIVE PERCENT: 0.9 %
EOSINOPHILS ABSOLUTE COUNT: 0.1 10*9/L (ref 0.0–0.5)
EOSINOPHILS RELATIVE PERCENT: 2 %
HEMATOCRIT: 32.7 % — ABNORMAL LOW (ref 39.0–48.0)
HEMOGLOBIN: 10.8 g/dL — ABNORMAL LOW (ref 12.9–16.5)
LYMPHOCYTES ABSOLUTE COUNT: 0.8 10*9/L — ABNORMAL LOW (ref 1.1–3.6)
LYMPHOCYTES RELATIVE PERCENT: 16.6 %
MEAN CORPUSCULAR HEMOGLOBIN CONC: 33 g/dL (ref 32.0–36.0)
MEAN CORPUSCULAR HEMOGLOBIN: 25.3 pg — ABNORMAL LOW (ref 25.9–32.4)
MEAN CORPUSCULAR VOLUME: 76.6 fL — ABNORMAL LOW (ref 77.6–95.7)
MEAN PLATELET VOLUME: 7 fL (ref 6.8–10.7)
MONOCYTES ABSOLUTE COUNT: 0.5 10*9/L (ref 0.3–0.8)
MONOCYTES RELATIVE PERCENT: 9.8 %
NEUTROPHILS ABSOLUTE COUNT: 3.3 10*9/L (ref 1.8–7.8)
NEUTROPHILS RELATIVE PERCENT: 70.7 %
NUCLEATED RED BLOOD CELLS: 0 /100{WBCs} (ref <=4)
PLATELET COUNT: 213 10*9/L (ref 150–450)
RED BLOOD CELL COUNT: 4.27 10*12/L (ref 4.26–5.60)
RED CELL DISTRIBUTION WIDTH: 18 % — ABNORMAL HIGH (ref 12.2–15.2)
WBC ADJUSTED: 4.6 10*9/L (ref 3.6–11.2)

## 2023-11-04 LAB — IGG: GAMMAGLOBULIN; IGG: 762 mg/dL (ref 650–1600)

## 2023-11-04 LAB — MAGNESIUM: MAGNESIUM: 1.7 mg/dL (ref 1.6–2.6)

## 2023-11-04 LAB — PHOSPHORUS: PHOSPHORUS: 2.5 mg/dL (ref 2.4–5.1)

## 2023-11-04 LAB — TACROLIMUS LEVEL, TROUGH: TACROLIMUS, TROUGH: 4.9 ng/mL — ABNORMAL LOW (ref 5.0–15.0)

## 2023-11-04 NOTE — Unmapped (Signed)
 Called Spencer LELON Silversmith Sr. to discuss lab results. Tacrolimus  level was normal at 4.9 with a goal of 5-7. Medication regimen is currently 1/1.5. No changes made, level within normal range.  dosing at this time to remain the same. Repeat labs scheduled monthly. Resulted labs reviewed. All questions answered. Pt verbalized understanding.

## 2023-11-07 MED FILL — AZITHROMYCIN 250 MG TABLET: ORAL | 90 days supply | Qty: 90 | Fill #3

## 2023-11-07 MED FILL — CALCIUM 600 MG (AS CALCIUM CARBONATE 1,500 MG) TABLET: ORAL | 30 days supply | Qty: 60 | Fill #8

## 2023-11-15 DIAGNOSIS — Z942 Lung transplant status: Principal | ICD-10-CM

## 2023-11-15 MED FILL — MONTELUKAST 10 MG TABLET: ORAL | 90 days supply | Qty: 90 | Fill #1

## 2023-11-15 MED FILL — ACCU-CHEK GUIDE TEST STRIPS: 25 days supply | Qty: 100 | Fill #1

## 2023-11-16 NOTE — Unmapped (Signed)
 [facility] Refill Coordination Note    Specialty Medication(s) to be Shipped:   Transplant: Valcyte  450mg     Other medication(s) to be shipped: Bacrtim  and   Pantoprazole        ALVA BROXSON Sr., DOB: 1951/03/12  Phone: [phone] (home)       All above HIPAA information was verified with patient.     Was a Nurse, learning disability used for this call? No    Completed refill call assessment today to schedule patient's medication shipment from the [facility]  [phone]).  All relevant notes have been reviewed.     Specialty medication(s) and dose(s) confirmed: Regimen is correct and unchanged.   Changes to medications: Constantine reports no changes at this time.  Changes to insurance: No  New side effects reported not previously addressed with a pharmacist or physician: None reported  Questions for the pharmacist: No    Confirmed patient received a Conservation officer, historic buildings and a Surveyor, mining with first shipment. The patient will receive a drug information handout for each medication shipped and additional FDA Medication Guides as required.       DISEASE/MEDICATION-SPECIFIC INFORMATION        N/A    SPECIALTY MEDICATION ADHERENCE     Medication Adherence    Patient reported X missed doses in the last month: 0  Specialty Medication: valGANciclovir  450 mg tablet (VALCYTE )  Patient is on additional specialty medications: No  Patient is on more than two specialty medications: No  Any gaps in refill history greater than 2 weeks in the last 3 months: no  Demonstrates understanding of importance of adherence: yes              Were doses missed due to medication being on hold? No    valGANciclovir  450 mg tablet (VALCYTE )  : 7  days of medicine on hand       REFERRAL TO PHARMACIST     Referral to the pharmacist: Not needed      [facility]     Shipping address confirmed in Epic.     Cost and Payment: Patient has a copay of $ 26.59  . They are aware and have authorized the pharmacy to charge the credit card on file.    Delivery Scheduled: Yes, Expected medication delivery date: 11/21/23.     Medication will be delivered via Same Day Courier to the prescription address in Epic WAM.    Tawni Daring   [facility]  Specialty Technician

## 2023-11-21 MED FILL — VALGANCICLOVIR 450 MG TABLET: ORAL | 60 days supply | Qty: 60 | Fill #2

## 2023-11-21 MED FILL — SULFAMETHOXAZOLE 400 MG-TRIMETHOPRIM 80 MG TABLET: ORAL | 84 days supply | Qty: 36 | Fill #3

## 2023-11-21 MED FILL — PANTOPRAZOLE 40 MG TABLET,DELAYED RELEASE: ORAL | 90 days supply | Qty: 90 | Fill #3

## 2023-11-24 NOTE — Unmapped (Unsigned)
 [facility] CLINIC PHARMACY NOTE  11/25/2023   Spencer FUSTER Sr.  [id number]    Medication changes today:   1. None    Education/Adherence tools provided today:  1. provided updated medication list    Follow up items:   Hgb A1c  Repeat DEXA  Repeat CMV TCIP    Next visit with pharmacy in PRN  ____________________________________________________________________    Spencer LELON Silversmith Sr. is a 73 y.o. 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    Other PMH significant for hypertension, T2DM, colon cancer s/p hemicolectomy and R hepatectomy for metastasis + chemotherapy (2007), BPH    Seen by pharmacy today for: medication management and  pill box assessment and adherence education.     CC:  Patient has no complaints today.     Vitals:    11/25/23 0928   BP: 129/79   Pulse: 70   Temp: 36.4 ??C (97.6 ??F)   SpO2: 100%         Allergies   Allergen Reactions    Cetuximab Anaphylaxis     Chest pain/rapid heart rate/ BP dropped    Enalapril       Angioedema      Venom-Honey Bee Anaphylaxis    Nsaids (Non-Steroidal Anti-Inflammatory Drug)      Interaction with transplant medications    Pollen Extracts Other (See Comments)     Nasal stuffiness, coughing, sneezing     Medications reviewed in EPIC medication station and updated today by the clinical pharmacist practitioner. Medication list includes revisions made during today???s encounter.    Outpatient Encounter Medications as of 11/25/2023   Medication Sig Dispense Refill    acetaminophen  (TYLENOL  EXTRA STRENGTH) 500 MG tablet Take 1-2 tablets (500-1,000 mg total) by mouth every six (6) hours as needed for pain. 180 tablet PRN    alendronate  (FOSAMAX ) 70 MG tablet Take 1 tablet (70 mg total) by mouth every seven (7) days. 12 tablet 3    amlodipine  (NORVASC ) 5 MG tablet Take 1 tablet (5 mg total) by mouth daily. 30 tablet 11    aspirin  (ECOTRIN) 81 MG tablet Take 1 tablet (81 mg total) by mouth daily. 90 tablet 3    atorvastatin  (LIPITOR) 40 MG tablet Take 1 tablet (40 mg total) by mouth daily. 90 tablet 3    azathioprine  (IMURAN ) 50 mg tablet Take 1 tablet (50 mg total) by mouth daily. 90 tablet 3    azithromycin  (ZITHROMAX ) 250 MG tablet Take 1 tablet (250 mg total) by mouth daily. 90 tablet 3    blood sugar diagnostic (ON CALL EXPRESS TEST STRIP) Strp Test daily before all meals/snacks and once before bedtime. 100 each 11    blood-glucose meter kit Accu-Check meter and supplies 1 each 0    calcium  carbonate (CALCIUM  600) 1,500 mg (600 mg elem calcium ) tablet Take 1 tablet (600 mg of elem calcium  total) by mouth Two (2) times a day. 60 tablet 11    cholecalciferol , vitamin D3 25 mcg, 1,000 units,, 1,000 unit (25 mcg) tablet Take 1 tablet (25 mcg total) by mouth daily. 100 tablet 3    fluticasone  propion-salmeterol (ADVAIR  HFA) 115-21 mcg/actuation inhaler Inhale 2 puffs Two (2) times a day. 12 g 11    fluticasone  propionate (FLONASE ) 50 mcg/actuation nasal spray Use 2 sprays into each nostril daily. 16 g 11    magnesium  oxide (MAG-OX) 400 mg (241.3 mg elemental magnesium ) tablet Take 2 tablets (800 mg total) by mouth at bedtime.  180 tablet 3    metFORMIN  (GLUCOPHAGE ) 1000 MG tablet Take 1 tablet (1,000 mg total) by mouth Two (2) times a day. 180 tablet 3    montelukast  (SINGULAIR ) 10 mg tablet Take 1 tablet (10 mg total) by mouth daily. 90 tablet 3    pantoprazole  (PROTONIX ) 40 MG tablet Take 1 tablet (40 mg total) by mouth daily. 90 tablet 3    predniSONE  (DELTASONE ) 5 MG tablet Take 1 tablet (5 mg total) by mouth daily. 90 tablet 3    sildenafiL , pulm.hypertension, (REVATIO ) 20 mg tablet Take 2-3 pills as needed (up to 60 mg) as needed for erectile dysfunction 60 tablet 3    SITagliptin  phosphate (JANUVIA ) 100 MG tablet Take 1 tablet (100 mg total) by mouth daily. 90 tablet 3    sulfamethoxazole -trimethoprim  (BACTRIM ) 400-80 mg per tablet Take 1 tablet (80 mg of trimethoprim  total) by mouth every Monday, Wednesday, and Friday. 36 tablet 3    valGANciclovir  (VALCYTE ) 450 mg tablet Take 1 tablet (450 mg total) by mouth daily. 30 tablet 11    [DISCONTINUED] fluticasone  propionate (CUTIVATE ) 0.05 % cream Apply 1 Application topically daily as needed.      [DISCONTINUED] tacrolimus  (PROGRAF ) 0.5 MG capsule Take 2 capsules (1 mg total) by mouth daily AND 3 capsules (1.5 mg total) nightly. 450 capsule 3     No facility-administered encounter medications on file as of 11/25/2023.     Immunosuppression:  Induction agent: basiliximab    Current immunosuppression:  tacrolimus  1mg  AM/ 1.5mg  PM  Prograf  goal: 5-7 (lower goal given CKD, no signs of rejection, tolerating imuran )  Azathioprine  50 mg daily   changed from MMF 2/2 diarrhea; dose decreased from 100mg  to 75mg  on 08/01/20 given down-trending ANC and IgG   dose decreased to 50mg  on 10/03/20   prednisone  5 mg daily    Patient is tolerating immunosuppression well.    IMMUNOSUPPRESSION DRUG LEVELS:  Lab Results   Component Value Date    Tacrolimus , Trough 8.3 11/25/2023    Tacrolimus , Trough 4.9 (L) 11/04/2023    Tacrolimus , Trough 4.2 (L) 10/21/2023     Tacrolimus  level is accurate 12 hour trough.    Graft function assessment via PFTs: stable  DSA: NTD, last checked 05/27/23  Biopsies to date: none  WBC/ANC:  wnl      Plan: Will maintain current immunosuppression pending today's tac level.     OI prophylaxis:   CMV Status: D+/ R-, high risk. CMV prophylaxis with valganciclovir  450 mg daily indefinitely. Negative VL 12/17/2022. T cell assay with low immunity 09/2022.   Estimated Creatinine Clearance: 39.4 mL/min (A) (based on SCr of 1.56 mg/dL (H)).  PCP: Prophylaxis with bactrim  SS 1 tab MWF indefinitely.  Fungal: Completed posaconazole  300 mg daily for prophylaxis x 3 months per protocol - COMPLETE (08/20/20)    Patient is tolerating infectious prophylaxis well.    Plan: continue to monitor. Consider repeat TCIP.     Infectious History:  Patient does not have a history of pathogenic infectious complications following lung transplant. 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 Previous antiinfective history use includes meropenem x7 days for broad spectrum gram negative and aspiration PNA coverage post-op per ICID.     Plan: Continue to monitor.    BOS/CLAD prophylaxis:  Current meds include: azithromycin  250 mg daily, montelukast  10 mg, and pantoprazole  40 mg daily, Advair  inhaler  Esophageal motility study on 07/23/20 showed abnormal distal esophagus exposure during recumbent posture and overall borderline normal. Endoscopy on 06/26/20  showed normal esophagus with erosive gastropathy with no stigmata. Patient denies any reflux symptoms with every day dosing.     Plan: Continue to monitor.     CV Prophylaxis:   Aspirin : asa 81 mg daily  The 10-year ASCVD risk score (Arnett DK, et al., 2019) is: 34.6%  Statin: atorvastatin  40 mg daily - pt tolerated switch from pravastatin  well  Lipid panel 11/19/22: LDL of 46   Plan: Continue to monitor. Repeat LDL at next visit    BP/Edema: Today in clinic BP 129/79 (Goal <130/80)    Encounter vitals reported above.  Home BP ranges: 120s-130s/70s  Weights: stable  Meds include: amlodipine  5 mg daily   Plan: within goal. Continue to monitor.     Anemia:  H/H:   Lab Results   Component Value Date    HGB 11.6 (L) 11/25/2023     Lab Results   Component Value Date    HCT 35.5 (L) 11/25/2023     Iron  panel:  Lab Results   Component Value Date    IRON  64 (L) 08/27/2022    TIBC 351 08/27/2022    FERRITIN 20.0 08/27/2022     Lab Results   Component Value Date    Iron  Saturation (%) 18 (L) 08/27/2022     Prior anemia therapy: IV Ferrlecit (1/10-1/13), IV iron  sucrose (1000 mg in 14 day period) completed 09/15/21    Plan: continue to monitor. Repeat iron  panel.      DM:   Lab Results   Component Value Date    A1C 7.5 (H) 11/25/2023     Current meds include:  Metformin  1000 mg PO BID  Januvia  100 mg PO daily  Home BS log: FBG 100s-120s; PPBG: 140s - 150s  Hypoglycemia: no      Plan: Added on repeat Hgb A1c today, which is much improved. Encouraged continued lifestyle and exercise modifications.    Electrolytes: wnl  Current meds include: MagOx 800mg  QHS  Plan: continue    Bone health:  Patient currently on long-term steroid therapy.  Vitamin D Level: 53.5 (08/26/23). Goal > 30.   Last DEXA results (11/19/2022): +1.5% at hip, +2.1% femoral neck   Current meds include: vitamin D3 1000 units daily, calcium  carbonate 600 mg BID, alendronate  70 mg weekly    Plan: Continue current therapy. Repeat DEXA. Continue to monitor.    Women's/Men's Health:  Carter Kassel. is a 73 y.o. male. Patient reports history of BPH. Reports that BPH symptoms are controlled.  Current meds include: None  -terazosin  3 mg PO nightly (increased 09/05/20 given increased BPH symptoms, held at last visit and recommended to stop by urology on 12/31/22)   Plan:  Continue to monitor.    Adherence:   Patient has good understanding of medications.  Patient does fill their own pill box on a regular basis at home. Wife had previously been filing for him, but he now handles it on his own for the most part.   Patient brought medication card: yes  Pill box: did not bring  Patient requested refills for the following meds: None  Corrections needed in Epic medication list: none    Plan: Provided basic adherence counseling/intervention    I spent a total of 30 minutes face to face with the patient delivering clinical care and providing education/counseling.    Patient was reviewed with Dr.Coakley who were in agreement with the stated plan.    During this visit, the following was completed:   BG  log data assessment  Labs ordered and evaluated  complex treatment plan >1 DS   Patient education was completed for 11-24 minutes     All questions/concerns were addressed to the patient's satisfaction.  __________________________________________  PATIENT SEEN AND EVALUATED BY:  Vernell Ore, PharmD, BCTXP  Cardiothoracic Transplant Pharmacist face to face with the patient delivering clinical care and providing education/counseling.    Patient was reviewed with Dr.Coakley who were in agreement with the stated plan.    During this visit, the following was completed:   BG log data assessment  Labs ordered and evaluated  complex treatment plan >1 DS   Patient education was completed for 11-24 minutes     All questions/concerns were addressed to the patient's satisfaction.  __________________________________________  PATIENT SEEN AND EVALUATED BY:  Vernell Ore, CPP, PharmD    Vernell Ore, CPP

## 2023-11-25 ENCOUNTER — Ambulatory Visit: Admit: 2023-11-25 | Discharge: 2023-11-25 | Payer: Medicare (Managed Care)

## 2023-11-25 ENCOUNTER — Inpatient Hospital Stay: Admit: 2023-11-25 | Discharge: 2023-11-25 | Payer: Medicare (Managed Care)

## 2023-11-25 DIAGNOSIS — Z942 Lung transplant status: Principal | ICD-10-CM

## 2023-11-25 LAB — CBC W/ AUTO DIFF
BASOPHILS ABSOLUTE COUNT: 0 10*9/L (ref 0.0–0.1)
BASOPHILS RELATIVE PERCENT: 0.7 %
EOSINOPHILS ABSOLUTE COUNT: 0.1 10*9/L (ref 0.0–0.5)
EOSINOPHILS RELATIVE PERCENT: 2.2 %
HEMATOCRIT: 35.5 % — ABNORMAL LOW (ref 39.0–48.0)
HEMOGLOBIN: 11.6 g/dL — ABNORMAL LOW (ref 12.9–16.5)
LYMPHOCYTES ABSOLUTE COUNT: 0.9 10*9/L — ABNORMAL LOW (ref 1.1–3.6)
LYMPHOCYTES RELATIVE PERCENT: 16.8 %
MEAN CORPUSCULAR HEMOGLOBIN CONC: 32.7 g/dL (ref 32.0–36.0)
MEAN CORPUSCULAR HEMOGLOBIN: 25.1 pg — ABNORMAL LOW (ref 25.9–32.4)
MEAN CORPUSCULAR VOLUME: 77 fL — ABNORMAL LOW (ref 77.6–95.7)
MEAN PLATELET VOLUME: 7.6 fL (ref 6.8–10.7)
MONOCYTES ABSOLUTE COUNT: 0.4 10*9/L (ref 0.3–0.8)
MONOCYTES RELATIVE PERCENT: 7.8 %
NEUTROPHILS ABSOLUTE COUNT: 3.7 10*9/L (ref 1.8–7.8)
NEUTROPHILS RELATIVE PERCENT: 72.5 %
PLATELET COUNT: 181 10*9/L (ref 150–450)
RED BLOOD CELL COUNT: 4.61 10*12/L (ref 4.26–5.60)
RED CELL DISTRIBUTION WIDTH: 19.3 % — ABNORMAL HIGH (ref 12.2–15.2)
WBC ADJUSTED: 5.1 10*9/L (ref 3.6–11.2)

## 2023-11-25 LAB — COMPREHENSIVE METABOLIC PANEL
ALBUMIN: 4.4 g/dL (ref 3.4–5.0)
ALKALINE PHOSPHATASE: 70 U/L (ref 46–116)
ALT (SGPT): 18 U/L (ref 10–49)
ANION GAP: 8 mmol/L (ref 5–14)
AST (SGOT): 27 U/L (ref <=34)
BILIRUBIN TOTAL: 0.4 mg/dL (ref 0.3–1.2)
BLOOD UREA NITROGEN: 25 mg/dL — ABNORMAL HIGH (ref 9–23)
BUN / CREAT RATIO: 16
CALCIUM: 9.9 mg/dL (ref 8.7–10.4)
CHLORIDE: 107 mmol/L (ref 98–107)
CO2: 29 mmol/L (ref 20.0–31.0)
CREATININE: 1.56 mg/dL — ABNORMAL HIGH (ref 0.73–1.18)
EGFR CKD-EPI (2021) MALE: 47 mL/min/1.73m2 — ABNORMAL LOW (ref >=60)
GLUCOSE RANDOM: 115 mg/dL (ref 70–179)
POTASSIUM: 4.2 mmol/L (ref 3.4–4.8)
PROTEIN TOTAL: 7.6 g/dL (ref 5.7–8.2)
SODIUM: 144 mmol/L (ref 135–145)

## 2023-11-25 LAB — IGG: GAMMAGLOBULIN; IGG: 795 mg/dL (ref 650–1600)

## 2023-11-25 LAB — HEMOGLOBIN A1C
ESTIMATED AVERAGE GLUCOSE: 169 mg/dL
HEMOGLOBIN A1C: 7.5 % — ABNORMAL HIGH (ref 4.8–5.6)

## 2023-11-25 LAB — CMV DNA, QUANTITATIVE, PCR: CMV VIRAL LD: NOT DETECTED

## 2023-11-25 LAB — MAGNESIUM: MAGNESIUM: 1.7 mg/dL (ref 1.6–2.6)

## 2023-11-25 LAB — PHOSPHORUS: PHOSPHORUS: 2.9 mg/dL (ref 2.4–5.1)

## 2023-11-25 LAB — EBV QUANTITATIVE PCR, BLOOD: EBV VIRAL LOAD RESULT: NOT DETECTED

## 2023-11-25 LAB — TACROLIMUS LEVEL, TROUGH: TACROLIMUS, TROUGH: 8.3 ng/mL (ref 5.0–15.0)

## 2023-11-25 MED ORDER — TACROLIMUS 0.5 MG CAPSULE, IMMEDIATE-RELEASE
ORAL_CAPSULE | Freq: Two times a day (BID) | ORAL | 3 refills | 90.00000 days | Status: CP
Start: 2023-11-25 — End: 2024-11-24
  Filled 2023-12-23: qty 360, 90d supply, fill #0

## 2023-11-25 NOTE — Unmapped (Signed)
 Pulmonary Transplant Clinic    HISTORY:   HPI:  Mr. Spencer Pena is a 73 y.o.  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    Interval events 11/25/23:  - Pt feeling great today. No significant concerns. No dyspnea, cough, fever, chills, sore throat. Denies acid reflux symptoms.  - Igg 795. PFT stable  - Cr 1.56 today, up from 1.25. Repeat bmp ordered.  - Exercises regularly; back at work (at the plant). 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     --> basiliximab induction    --> CMV high risk (D+/R-)    --> EBV moderate risk (D+/R+)    --> Not Increased Risk Donor  - Colon Cancer 2007    --> s/p resection of liver mets    --> s/p resection of 6 inches of colon    --> FOLFOX Chemo  - type 2 diabetes mellitus     --> not insulin  dependent  - OA  - Bicept tear    SOCIAL HISTORY:   - 20pk years, quit in 1991  - previously drank about 10 drinks per week   - Worked maintenance at [employer]  - Married and lives in [place]    FAMILY HISTORY:   - Parents lived into late 76's  - Father had colon cancer  - No siblings had colon cancer    MEDS: (personally reviewed in EPIC, pertinent meds noted below)         OBJECTIVE DATA:   PHYSICAL EXAM:  There were no vitals taken for this visit.  Gen: - awake, alert, in NAD, very pleasant, upbeat  Vascular: good pulses throughout  CV: regular rate rhythm  Pulm: good air movement bilaterally, normal work of breathing  Abd: non distended  Ext: no LE edema, dose have deformity of RUE bicep, swelling consistent with tendon rupture  Neuro: Alert, oriented, follows commands. Moving all extremities spontaneously    LABS: (reviewed in Epic, pertinent values noted below)     PFTs: (personally reviewed and interpereted):    Date: FVC (% Pred) FEV1 (% Pred) FEF25-75(% Pred) DLCO TBBx/Results   11/25/2023 4.11 (122%)  3.55 (137.7) 5.14     08/26/23 4.14 (122) 3.58 (139) 5.11 (258)     05/27/23 4.10 3.63      02/25/23 4.08 (120%) 3.55 (137%) 87 (112%)     11/19/22  4.11 3.68      08/27/22 4.20 (123%) 3.67 (141%) 5.59 (276.9%)     05/28/22 4.16 (121.5%) 3.69 (141.4%) 3.23 (307.1%)     02/19/2022 4.03 (110.4) 3.52 (126.7) 5.19 (241.3)     11/17/21 4.13 (113%) 3.63 (130%)      08/18/21 4.01 (109%) 3.55 (127%)      05/15/2021 4.21(114%) 3.65 (130.3%) 5.54 (254%)     04/06/2021 4.08 (111%) 3.64 (129.6%) 6.01 (274.3%)     12/31/20 3.95 (107.1%) 3.46 (122.6%) 5.20 (236.4%)     12/03/2020 3.87 (104.9%) 3.47 (122.9%) 5.69 (258.3%)     10/03/2020 3.68 (99.4) 3.13 (110.6)  4.22 (190.6)      09/05/20  3.70  3.14      08/20/20 3.65 (98%) 3.15 (111%) 4.69 (211%)      08/01/20 3.64 (98.2%) 3.10 (109.3%) 4.41 (198.8%)     07/18/20  3.49 (94%) 2.98 (105%) 4.20 (189%)     07/04/20 3.43 (92.5%) 2.97 (104.7%) 4.26 (191.7%)     06/27/2020 3.13 84.5 2.70 95.4 3.51 157.8     06/20/20 3.39 (91.4%) 2.82 (99.2%) 3.34 (150.35)     06/13/20 3.10 (72.8%) 2.66 (82.5%) 3.34 (136.2%)      05/15/20      BOLT    03/11/20  2.75 (72%)  2.36 (81%)       02/12/20  2.85 (75%)  2.44 (86%)                IMAGING: Reviewed in EMR, CXR with broken sternal wires, otherwise stable    CXR (personally reviewed and interpreted)  - CXR stable, no acute process    ASSESSMENT and PLAN     Mr. Spencer Pena is a 73 y.o. man with IPF and emphysema s/p BOLT on 05/15/2020, also with type 2 diabetes mellitus, hypertension, colon cancer in 2007 s/p partial colon and hepatic wedge resection and FOLFOX here for transplant complicaitons    Graft Function and Immunosuppression:  - PFTs and CXR are stable, feels well  - No DSAs, checked quarterly, last 06/2022, pending today  - Total IgG 731 (02/2023)  - Immunosuppression:     --> Continue tac goal 5-7. Currently taking 1mg  / 2mg  [ ]  Need to follow up level    --> AZA 50 mg daily (lower dose due to pancytopenia); no MMF due to GI upset    --> DECREASE Prednisone  7.5mg  daily (stopped 08/26/23) --> PREDNISONE  5mg  DAILY esp in setting of high A1c  - BOS PPX    --> Azithromycin  250mg  daily    --> ASA 81mg  daily    --> Continue atorvastatin  02/2022    --> Continue Advair  BID    --> Montelukast  10mg  daily    Antimicrobial Prophylaxis:  - CMV, high risk (D+/R-): valganciclovir  450mg  daily indefinitely for CrCl cannot be calculated (Unknown ideal weight.).   Negative VL 12/29/2021. T cell assay with low immunity 09/2022, will repeat May 2025  - EBV (D+/R+): negative 12/29/21  - Fungal: Posaconazole  stopped at 28mo  - PCP: Bactrim  SS MWF    Hypertension  - amlodipine  5mg  every day    CKD IIIa   - Cr increased today. We will repeat labs next week.    OSA   - STOP-BANG 4 points (male, age, observed apnea, snoring).  - PSG 12/29/2020   CPAP at level of 10 cm H2O with expiratory pressure relief (EPR) = 3 and with heated humidifier for nasal dryness, mask: AirFit N20 size medium or mask of patient???s preference.  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  - pH probe with DeMeester 16.7%, slightly in abnormal range but will hold off on surgical intervention at this time in the setting of normal graft function.  - Daily PPI   - no reflux symptoms, no difficulty swallowing    BPH   - terazosin  3mg  at bedtime  - UA completed at prior appt, benign.  - Normal CEA and PSA  - Follows with urology    Iron  Deficiency Anemia   - receives periodic iron  infusions    Hypomagnesemia   - magnesium  oxide 800 mg  BID    Type 2 diabetes mellitus   - metformin  1000mg  BID  - Januvia  100  - PVL ABI and carotid duplex normal 11/2021  - fasting BG 110-120  - Last A1c (08/26/23) 8.0; discussed dietary changes; further education with dietician today 08/26/23    Bone Health:   - Last Dexa: low bone mass on DEXA 11/2022, +1.5% at hip, +2.1% femoral neck  - on Ca and Vit D supplementation currently  - alendronate  70mg  weekly  - Dexa scan  ordered.     H/o colon cancer in 2007 s/p partial colon and hepatic wedge resection and FOLFOX  Last colonoscopy 08/10/23 with 5 polyps (adenocarcinoma)      Health Maintenance:  - Last derm visit:  12/2021  - Last Colonsocopy (if >40):   08/2023 (5 polyps, adenoma), repeat 3 in 3 yrs. need repeat TBD  - Last flu shot:   02/19/2022  - RSV vaccine:   Completed locally 06/2022  - Last Pneumovax:  06/10/2004, 12/30/2017  - Last Prevnar:  03/25/2016  - TdaP:   02/11/2018  - Shingrix :    03/10/20, 08/20/20  - COVID-19 (Moderna) 06/11/19, 07/09/19, 03/10/20, 07/18/2020, 12/03/20 received 1/2 dose Evushield,03/2021 (Bivalent); SpikeVax 02/19/2022 and 08/27/22, 02/2023. 08/26/23  - Last Dexa:   02/2020, 11/17/21 -> T-score -2.2 FRAX score 8%, July 2024 FRAX   - Last vitamin D:  66.9 05/2021, recheck 08/26/23.  - Last HbA1c:   (08/26/23) 8.0     Complex medical decision making was done as we adjust medications based on blood work/drug levels and multiple complaints and problems were addressed. Patient was seen and discussed by Dr. Curlene.     Hadeel Hillebrand Claudio Stallion, MD  Pulmonary and Critical Care Fellow

## 2023-11-25 NOTE — Unmapped (Signed)
 Please see patient pharmacy visit for documentation.

## 2023-11-25 NOTE — Unmapped (Signed)
 Called Spencer LELON Silversmith Sr. to discuss lab results. Tacrolimus  level was increased at 8.3 with a goal of 5-7. Medication regimen is currently 1/1.5. Discussed with Vernell Ore, CPP dosing at this time decreased to  1 mg BID. Repeat labs scheduled one week. Resulted labs reviewed. All questions answered. Pt verbalized understanding.

## 2023-11-28 LAB — HLA DS POST TRANSPLANT
ANTI-DONOR DRW #1 MFI: 129 MFI
ANTI-DONOR DRW #2 MFI: 129 MFI
ANTI-DONOR HLA-A #1 MFI: 0 MFI
ANTI-DONOR HLA-A #2 MFI: 0 MFI
ANTI-DONOR HLA-B #1 MFI: 0 MFI
ANTI-DONOR HLA-B #2 MFI: 0 MFI
ANTI-DONOR HLA-C #2 MFI: 0 MFI
ANTI-DONOR HLA-DQB #1 MFI: 591 MFI
ANTI-DONOR HLA-DQB #2 MFI: 0 MFI
ANTI-DONOR HLA-DR #1 MFI: 0 MFI
ANTI-DONOR HLA-DR #2 MFI: 0 MFI

## 2023-11-28 LAB — FSAB CLASS 1 ANTIBODY SPECIFICITY: HLA CLASS 1 ANTIBODY RESULT: NEGATIVE

## 2023-11-28 LAB — FSAB CLASS 2 ANTIBODY SPECIFICITY: HLA CL2 AB RESULT: POSITIVE

## 2023-12-01 MED FILL — PREDNISONE 5 MG TABLET: ORAL | 90 days supply | Qty: 90 | Fill #1

## 2023-12-01 MED FILL — ASPIRIN 81 MG TABLET,DELAYED RELEASE: ORAL | 90 days supply | Qty: 90 | Fill #1

## 2023-12-01 MED FILL — ATORVASTATIN 40 MG TABLET: ORAL | 90 days supply | Qty: 90 | Fill #3

## 2023-12-01 MED FILL — CALCIUM 600 MG (AS CALCIUM CARBONATE 1,500 MG) TABLET: ORAL | 30 days supply | Qty: 60 | Fill #9

## 2023-12-02 ENCOUNTER — Encounter
Admit: 2023-12-02 | Discharge: 2023-12-03 | Payer: Medicare (Managed Care) | Attending: Internal Medicine | Primary: Internal Medicine

## 2023-12-02 ENCOUNTER — Ambulatory Visit: Admit: 2023-12-02 | Discharge: 2023-12-03 | Payer: Medicare (Managed Care)

## 2023-12-02 LAB — COMPREHENSIVE METABOLIC PANEL
ALBUMIN: 3.9 g/dL (ref 3.4–5.0)
ALKALINE PHOSPHATASE: 71 U/L (ref 46–116)
ALT (SGPT): 17 U/L (ref 10–49)
ANION GAP: 12 mmol/L (ref 5–14)
AST (SGOT): 20 U/L (ref <=34)
BILIRUBIN TOTAL: 0.3 mg/dL (ref 0.3–1.2)
BLOOD UREA NITROGEN: 25 mg/dL — ABNORMAL HIGH (ref 9–23)
BUN / CREAT RATIO: 20
CALCIUM: 9.4 mg/dL (ref 8.7–10.4)
CHLORIDE: 104 mmol/L (ref 98–107)
CO2: 29.2 mmol/L (ref 20.0–31.0)
CREATININE: 1.28 mg/dL — ABNORMAL HIGH (ref 0.73–1.18)
EGFR CKD-EPI (2021) MALE: 59 mL/min/1.73m2 — ABNORMAL LOW (ref >=60)
GLUCOSE RANDOM: 103 mg/dL (ref 70–179)
POTASSIUM: 4.1 mmol/L (ref 3.4–4.8)
PROTEIN TOTAL: 6.7 g/dL (ref 5.7–8.2)
SODIUM: 145 mmol/L (ref 135–145)

## 2023-12-02 LAB — TACROLIMUS LEVEL, TROUGH: TACROLIMUS, TROUGH: 7 ng/mL (ref 5.0–15.0)

## 2023-12-02 NOTE — Unmapped (Signed)
 Called Winford LELON Silversmith Sr. to discuss lab results. Tacrolimus  level was normal at 7 with a goal of 5-7. Medication regimen is currently 1 mg BID. No changes made, level within normal range.  dosing at this time to remain the same. Repeat labs scheduled monthly. Resulted labs reviewed. LMx1

## 2023-12-13 DIAGNOSIS — D509 Iron deficiency anemia, unspecified: Principal | ICD-10-CM

## 2023-12-13 DIAGNOSIS — Z942 Lung transplant status: Principal | ICD-10-CM

## 2023-12-13 MED ORDER — SILDENAFIL (PULMONARY HYPERTENSION) 20 MG TABLET
ORAL_TABLET | ORAL | 3 refills | 0.00000 days | Status: CP
Start: 2023-12-13 — End: ?

## 2023-12-16 NOTE — Unmapped (Signed)
NP swab specimen collected and sent to lab.

## 2023-12-21 NOTE — Unmapped (Signed)
 [facility] Delivery Pharm[facility] Refill Coordination Note    Specialty Medication(s) to be Shipped:   Transplant: tacrolimus  0.5 mg    Other medication(s) to be shipped: No additional medications requested for fill at this time    Specialty Medications not needed at this time: N/A     Spencer ENGLERT Sr., DOB: 1951-01-19  Phone: [phone] (home)       All above HIPAA information was verified with patient.     Was a Nurse, learning disability used for this call? No    Completed refill call assessment today to schedule patient's medication shipment from the [facility]  [phone]).  All relevant notes have been reviewed.     Specialty medication(s) and dose(s) confirmed: Regimen is correct and unchanged.   Changes to medications: [name] reports no changes at this time.  Changes to insurance: No  New side effects reported not previously addressed with a pharmacist or physician: None reported  Questions for the pharmacist: No    Confirmed patient received a Conservation officer, historic buildings and a Surveyor, mining with first shipment. The patient will receive a drug information handout for each medication shipped and additional FDA Medication Guides as required.       DISEASE/MEDICATION-SPECIFIC INFORMATION        N/A    SPECIALTY MEDICATION ADHERENCE     Medication Adherence    Patient reported X missed doses in the last month: 0  Specialty Medication: tacrolimus  0.5 MG capsule (PROGRAF )  Patient is on additional specialty medications: No              Were doses missed due to medication being on hold? No    tacrolimus  0.5 MG capsule (PROGRAF )  10 days of medicine on hand       REFERRAL TO PHARMACIST     Referral to the pharmacist: Not needed      [facility]     Shipping address confirmed in Epic.     Cost and Payment: Patient has a copay of $24.00. They are aware and have authorized the pharmacy to charge the credit card on file.    Delivery Scheduled: Yes, Expected medication delivery date: 12/23/2023. Medication will be delivered via Same Day Courier to the prescription address in Epic WAM.    [facility]  Specialty Technician

## 2023-12-22 DIAGNOSIS — N529 Male erectile dysfunction, unspecified: Principal | ICD-10-CM

## 2023-12-23 ENCOUNTER — Encounter
Admit: 2023-12-23 | Discharge: 2023-12-24 | Payer: Medicare (Managed Care) | Attending: Internal Medicine | Primary: Internal Medicine

## 2023-12-23 ENCOUNTER — Ambulatory Visit: Admit: 2023-12-23 | Discharge: 2023-12-24 | Payer: Medicare (Managed Care)

## 2023-12-23 DIAGNOSIS — Z942 Lung transplant status: Principal | ICD-10-CM

## 2023-12-23 DIAGNOSIS — Z79899 Other long term (current) drug therapy: Principal | ICD-10-CM

## 2023-12-23 LAB — TACROLIMUS LEVEL, TROUGH: TACROLIMUS, TROUGH: 4.9 ng/mL — ABNORMAL LOW (ref 5.0–15.0)

## 2023-12-23 LAB — COMPREHENSIVE METABOLIC PANEL
ALBUMIN: 4.1 g/dL (ref 3.4–5.0)
ALKALINE PHOSPHATASE: 70 U/L (ref 46–116)
ALT (SGPT): 18 U/L (ref 10–49)
ANION GAP: 13 mmol/L (ref 5–14)
AST (SGOT): 23 U/L (ref <=34)
BILIRUBIN TOTAL: 0.4 mg/dL (ref 0.3–1.2)
BLOOD UREA NITROGEN: 26 mg/dL — ABNORMAL HIGH (ref 9–23)
BUN / CREAT RATIO: 18
CALCIUM: 10 mg/dL (ref 8.7–10.4)
CHLORIDE: 101 mmol/L (ref 98–107)
CO2: 31.5 mmol/L — ABNORMAL HIGH (ref 20.0–31.0)
CREATININE: 1.42 mg/dL — ABNORMAL HIGH (ref 0.73–1.18)
EGFR CKD-EPI (2021) MALE: 52 mL/min/1.73m2 — ABNORMAL LOW (ref >=60)
GLUCOSE RANDOM: 109 mg/dL (ref 70–179)
POTASSIUM: 4 mmol/L (ref 3.4–4.8)
PROTEIN TOTAL: 6.9 g/dL (ref 5.7–8.2)
SODIUM: 145 mmol/L (ref 135–145)

## 2023-12-23 MED ORDER — SILDENAFIL (PULMONARY HYPERTENSION) 20 MG TABLET
ORAL_TABLET | ORAL | 3 refills | 0.00000 days | Status: CP
Start: 2023-12-23 — End: ?

## 2023-12-23 NOTE — Unmapped (Signed)
 Called Spencer LELON Silversmith Sr. to discuss lab results. Tacrolimus  level was normal at 4.9 with a goal of 5-7. Medication regimen is currently 1 mg BID. No changes made, level within normal range.  dosing at this time to remain the same. Repeat labs scheduled monthly. Resulted labs reviewed. All questions answered. Pt verbalized understanding.

## 2023-12-23 NOTE — Unmapped (Signed)
 error

## 2023-12-29 MED FILL — CALCIUM 600 MG (AS CALCIUM CARBONATE 1,500 MG) TABLET: ORAL | 30 days supply | Qty: 60 | Fill #10

## 2023-12-29 MED FILL — AMLODIPINE 5 MG TABLET: ORAL | 90 days supply | Qty: 90 | Fill #1

## 2023-12-29 MED FILL — ACETAMINOPHEN 500 MG TABLET: ORAL | 23 days supply | Qty: 180 | Fill #3

## 2024-01-12 MED FILL — MAGNESIUM OXIDE 400 MG (241.3 MG MAGNESIUM) TABLET: ORAL | 60 days supply | Qty: 120 | Fill #4

## 2024-01-12 MED FILL — VALGANCICLOVIR 450 MG TABLET: ORAL | 60 days supply | Qty: 60 | Fill #3

## 2024-01-15 DIAGNOSIS — D509 Iron deficiency anemia, unspecified: Principal | ICD-10-CM

## 2024-01-15 DIAGNOSIS — Z942 Lung transplant status: Principal | ICD-10-CM

## 2024-01-19 MED FILL — ALENDRONATE 70 MG TABLET: ORAL | 84 days supply | Qty: 12 | Fill #1

## 2024-01-19 MED FILL — METFORMIN 1,000 MG TABLET: ORAL | 90 days supply | Qty: 180 | Fill #1

## 2024-01-19 MED FILL — JANUVIA 100 MG TABLET: ORAL | 90 days supply | Qty: 90 | Fill #1

## 2024-01-27 ENCOUNTER — Ambulatory Visit: Admit: 2024-01-27 | Discharge: 2024-01-28 | Payer: Medicare (Managed Care)

## 2024-01-27 LAB — COMPREHENSIVE METABOLIC PANEL
ALBUMIN: 4 g/dL (ref 3.4–5.0)
ALKALINE PHOSPHATASE: 72 U/L (ref 46–116)
ALT (SGPT): 17 U/L (ref 10–49)
ANION GAP: 13 mmol/L (ref 5–14)
AST (SGOT): 23 U/L (ref <=34)
BILIRUBIN TOTAL: 0.4 mg/dL (ref 0.3–1.2)
BLOOD UREA NITROGEN: 21 mg/dL (ref 9–23)
BUN / CREAT RATIO: 16
CALCIUM: 9.4 mg/dL (ref 8.7–10.4)
CHLORIDE: 103 mmol/L (ref 98–107)
CO2: 29.9 mmol/L (ref 20.0–31.0)
CREATININE: 1.29 mg/dL — ABNORMAL HIGH (ref 0.73–1.18)
EGFR CKD-EPI (2021) MALE: 59 mL/min/1.73m2 — ABNORMAL LOW (ref >=60)
GLUCOSE RANDOM: 109 mg/dL (ref 70–179)
POTASSIUM: 3.9 mmol/L (ref 3.4–4.8)
PROTEIN TOTAL: 6.7 g/dL (ref 5.7–8.2)
SODIUM: 146 mmol/L — ABNORMAL HIGH (ref 135–145)

## 2024-01-27 LAB — CBC W/ AUTO DIFF
BASOPHILS ABSOLUTE COUNT: 0 10*9/L (ref 0.0–0.1)
BASOPHILS RELATIVE PERCENT: 0.7 %
EOSINOPHILS ABSOLUTE COUNT: 0.1 10*9/L (ref 0.0–0.5)
EOSINOPHILS RELATIVE PERCENT: 2.2 %
HEMATOCRIT: 33.8 % — ABNORMAL LOW (ref 39.0–48.0)
HEMOGLOBIN: 11 g/dL — ABNORMAL LOW (ref 12.9–16.5)
LYMPHOCYTES ABSOLUTE COUNT: 0.6 10*9/L — ABNORMAL LOW (ref 1.1–3.6)
LYMPHOCYTES RELATIVE PERCENT: 16.9 %
MEAN CORPUSCULAR HEMOGLOBIN CONC: 32.4 g/dL (ref 32.0–36.0)
MEAN CORPUSCULAR HEMOGLOBIN: 25.8 pg — ABNORMAL LOW (ref 25.9–32.4)
MEAN CORPUSCULAR VOLUME: 79.4 fL (ref 77.6–95.7)
MEAN PLATELET VOLUME: 7.3 fL (ref 6.8–10.7)
MONOCYTES ABSOLUTE COUNT: 0.4 10*9/L (ref 0.3–0.8)
MONOCYTES RELATIVE PERCENT: 9.6 %
NEUTROPHILS ABSOLUTE COUNT: 2.6 10*9/L (ref 1.8–7.8)
NEUTROPHILS RELATIVE PERCENT: 70.6 %
NUCLEATED RED BLOOD CELLS: 0 /100{WBCs} (ref <=4)
PLATELET COUNT: 194 10*9/L (ref 150–450)
RED BLOOD CELL COUNT: 4.26 10*12/L (ref 4.26–5.60)
RED CELL DISTRIBUTION WIDTH: 19.4 % — ABNORMAL HIGH (ref 12.2–15.2)
WBC ADJUSTED: 3.7 10*9/L (ref 3.6–11.2)

## 2024-01-27 LAB — MAGNESIUM: MAGNESIUM: 1.9 mg/dL (ref 1.6–2.6)

## 2024-01-27 LAB — PHOSPHORUS: PHOSPHORUS: 2.6 mg/dL (ref 2.4–5.1)

## 2024-01-27 LAB — TACROLIMUS LEVEL: TACROLIMUS BLOOD: 5.2 ng/mL

## 2024-01-27 LAB — IGG: GAMMAGLOBULIN; IGG: 743 mg/dL (ref 650–1600)

## 2024-01-27 NOTE — Unmapped (Signed)
 Created in error

## 2024-01-27 NOTE — Unmapped (Signed)
 Called Spencer LELON Silversmith Sr. to discuss lab results. Tacrolimus  level was normal at 5.2 with a goal of 5-7. Medication regimen is currently 1 mg BID. No changes made, level within normal range.  dosing at this time to remain the same. Repeat labs scheduled monthly. Resulted labs reviewed. All questions answered. Pt verbalized understanding.

## 2024-02-01 DIAGNOSIS — Z942 Lung transplant status: Principal | ICD-10-CM

## 2024-02-01 MED ORDER — AZITHROMYCIN 250 MG TABLET
ORAL_TABLET | Freq: Every day | ORAL | 3 refills | 90.00000 days | Status: CP
Start: 2024-02-01 — End: 2025-01-31
  Filled 2024-02-06: qty 90, 90d supply, fill #0

## 2024-02-01 NOTE — Unmapped (Signed)
 [facility] Delivery Pharmacy Refill Coordination Note    Specialty Medication(s) to be Shipped:   Transplant: Azathioprine     Other medication(s) to be shipped: Montelukast , Calcium  carbonate, Cholecalciferol , Azithromycin     Specialty Medications not needed at this time: N/A     Spencer METER Sr., DOB: October 31, 1950  Phone: [phone] (home)       All above HIPAA information was verified with patient.     Was a Nurse, learning disability used for this call? No    Completed refill call assessment today to schedule patient's medication shipment from the [facility] and Home Delivery Pharmacy  [phone]).  All relevant notes have been reviewed.     Specialty medication(s) and dose(s) confirmed: Regimen is correct and unchanged.   Changes to medications: Nkosi reports no changes at this time.  Changes to insurance: No  New side effects reported not previously addressed with a pharmacist or physician: None reported  Questions for the pharmacist: No    Confirmed patient received a Conservation officer, historic buildings and a Surveyor, mining with first shipment. The patient will receive a drug information handout for each medication shipped and additional FDA Medication Guides as required.       DISEASE/MEDICATION-SPECIFIC INFORMATION        N/A    SPECIALTY MEDICATION ADHERENCE     Medication Adherence    Specialty Medication: azathioprine  50 mg tablet  Patient is on additional specialty medications: Yes  Additional Specialty Medications: tacrolimus  0.5 MG capsule  Patient is on more than two specialty medications: No              Were doses missed due to medication being on hold? No    Azathioprine  50 mg: 5 days of medicine on hand        REFERRAL TO PHARMACIST     Referral to the pharmacist: Not needed      [facility]     Shipping address confirmed in Epic.     Cost and Payment: Patient has a copay of $57.94. They are aware and have authorized the pharmacy to charge the credit card on file.    Delivery Scheduled: Yes, Expected medication delivery date: 02/06/24.     Medication will be delivered via UPS to the prescription address in Epic WAM.    Kelly CHRISTELLA Eagles   [facility] Delivery [facility]  Specialty Technician

## 2024-02-06 MED FILL — MONTELUKAST 10 MG TABLET: ORAL | 90 days supply | Qty: 90 | Fill #2

## 2024-02-06 MED FILL — CHOLECALCIFEROL (VITAMIN D3) 25 MCG (1,000 UNIT) TABLET: ORAL | 100 days supply | Qty: 100 | Fill #1

## 2024-02-06 MED FILL — CALCIUM 600 MG (AS CALCIUM CARBONATE 1,500 MG) TABLET: ORAL | 30 days supply | Qty: 60 | Fill #11

## 2024-02-06 MED FILL — AZATHIOPRINE 50 MG TABLET: ORAL | 90 days supply | Qty: 90 | Fill #1

## 2024-02-08 DIAGNOSIS — Z942 Lung transplant status: Principal | ICD-10-CM

## 2024-02-08 MED ORDER — SULFAMETHOXAZOLE 400 MG-TRIMETHOPRIM 80 MG TABLET
ORAL_TABLET | ORAL | 3 refills | 84.00000 days | Status: CP
Start: 2024-02-08 — End: 2025-02-07
  Filled 2024-02-16: qty 36, 84d supply, fill #0

## 2024-02-08 MED ORDER — AZITHROMYCIN 250 MG TABLET
ORAL_TABLET | Freq: Every day | ORAL | 3 refills | 90.00000 days | Status: CP
Start: 2024-02-08 — End: 2025-02-07
  Filled 2024-04-20: qty 90, 90d supply, fill #0

## 2024-02-12 DIAGNOSIS — Z942 Lung transplant status: Principal | ICD-10-CM

## 2024-02-12 DIAGNOSIS — D509 Iron deficiency anemia, unspecified: Principal | ICD-10-CM

## 2024-02-16 DIAGNOSIS — Z942 Lung transplant status: Principal | ICD-10-CM

## 2024-02-20 DIAGNOSIS — Z942 Lung transplant status: Principal | ICD-10-CM

## 2024-02-20 DIAGNOSIS — R799 Abnormal finding of blood chemistry, unspecified: Principal | ICD-10-CM

## 2024-02-22 DIAGNOSIS — K219 Gastro-esophageal reflux disease without esophagitis: Principal | ICD-10-CM

## 2024-02-22 MED ORDER — PANTOPRAZOLE 40 MG TABLET,DELAYED RELEASE
ORAL_TABLET | Freq: Every day | ORAL | 3 refills | 90.00000 days | Status: CP
Start: 2024-02-22 — End: ?
  Filled 2024-03-01: qty 90, 90d supply, fill #0

## 2024-02-22 NOTE — Unmapped (Signed)
 Pt request for RX Refill

## 2024-02-23 NOTE — Unmapped (Signed)
 Pulmonary Transplant Clinic    HISTORY:   HPI:  Mr. Spencer Pena is a 73 y.o.  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     --> basiliximab induction    --> CMV high risk (D+/R-)    --> EBV moderate risk (D+/R+)    --> Not Increased Risk Donor  - Colon Cancer 2007    --> s/p resection of liver mets    --> s/p resection of 6 inches of colon    --> FOLFOX Chemo  - type 2 diabetes mellitus     --> not insulin  dependent  - OA  - Bicept tear    SOCIAL HISTORY:   - 20pk years, quit in 1991  - previously drank about 10 drinks per week   - Worked maintenance at [employer]  - Married and lives in [place]    FAMILY HISTORY:   - Parents lived into late 89's  - Father had colon cancer  - No siblings had colon cancer    MEDS: (personally reviewed in EPIC, pertinent meds noted below)         OBJECTIVE DATA:   PHYSICAL EXAM:  BP 144/78 (BP Site: L Arm, BP Position: Sitting, BP Cuff Size: Medium)  - Pulse 75  - Temp 35.9 ??C (96.6 ??F) (Tympanic)  - Ht 170.2 cm (5' 7.01)  - Wt 77.3 kg (170 lb 8 oz)  - SpO2 97%  - BMI 26.70 kg/m??   Gen: - awake, alert, in NAD, very pleasant, upbeat  Vascular: good pulses throughout  CV: regular rate rhythm  Pulm: good air movement bilaterally, normal work of breathing  Abd: non distended  Ext: no LE edema, dose have deformity of RUE bicep, swelling consistent with tendon rupture  Neuro: Alert, oriented, follows commands. Moving all extremities spontaneously    LABS: (reviewed in Epic, pertinent values noted below)     PFTs: (personally reviewed and interpereted):    Date: FVC (% Pred) FEV1 (% Pred) FEF25-75(% Pred) DLCO TBBx/Results   02/24/24 4.24 (123) 3.6 (138) 4.86 (245) 11/25/2023 4.11 (122%)  3.55 (137.7) 5.14     08/26/23 4.14 (122) 3.58 (139) 5.11 (258)     05/27/23 4.10 3.63      02/25/23 4.08 (120%) 3.55 (137%) 87 (112%)     11/19/22  4.11 3.68      08/27/22 4.20 (123%) 3.67 (141%) 5.59 (276.9%)     05/28/22 4.16 (121.5%) 3.69 (141.4%) 3.23 (307.1%)     02/19/2022 4.03 (110.4) 3.52 (126.7) 5.19 (241.3)     11/17/21 4.13 (113%) 3.63 (130%)      08/18/21 4.01 (109%) 3.55 (127%)      05/15/2021 4.21(114%) 3.65 (130.3%) 5.54 (254%)     04/06/2021 4.08 (111%) 3.64 (129.6%) 6.01 (274.3%)     12/31/20 3.95 (107.1%) 3.46 (122.6%) 5.20 (236.4%)     12/03/2020 3.87 (104.9%) 3.47 (122.9%) 5.69 (258.3%)     10/03/2020 3.68 (99.4) 3.13 (110.6)  4.22 (190.6)      09/05/20  3.70  3.14      08/20/20 3.65 (98%) 3.15 (111%) 4.69 (211%)      08/01/20 3.64 (98.2%) 3.10 (109.3%) 4.41 (  198.8%)     07/18/20 3.49 (94%) 2.98 (105%) 4.20 (189%)     07/04/20 3.43 (92.5%) 2.97 (104.7%) 4.26 (191.7%)     06/27/2020 3.13 84.5 2.70 95.4 3.51 157.8     06/20/20 3.39 (91.4%) 2.82 (99.2%) 3.34 (150.35)     06/13/20 3.10 (72.8%) 2.66 (82.5%) 3.34 (136.2%)      05/15/20      BOLT    03/11/20  2.75 (72%)  2.36 (81%)       02/12/20  2.85 (75%)  2.44 (86%)                IMAGING: Reviewed in EMR, CXR with broken sternal wires, otherwise stable    ASSESSMENT and PLAN     Mr. Spencer Pena is a 73 y.o. 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 Currently taking 1mg  / 2mg     --> AZA 50 mg daily (lower dose due to pancytopenia); no MMF due to GI upset    --> DECREASE Prednisone  7.5mg  daily (stopped 08/26/23) --> PREDNISONE  5mg  DAILY esp in setting of high A1c  - BOS PPX    --> Azithromycin  250mg  daily    --> ASA 81mg  daily    --> Continue atorvastatin  02/2022    --> Continue Advair  BID    --> Montelukast  10mg  daily    Antimicrobial Prophylaxis:  - CMV, high risk (D+/R-): valganciclovir  450mg  daily indefinitely for Estimated Creatinine Clearance: 41.6 mL/min (A) (based on SCr of 1.48 mg/dL (H)).   Negative VL 12/29/2021. T cell assay with low immunity 09/2022, will repeat May 2025  - EBV (D+/R+): negative 12/29/21  - Fungal: Posaconazole  stopped at 34mo  - PCP: Bactrim  SS MWF    Hypertension  - amlodipine  5mg  every day    CKD IIIa   - ranges between 1.1-1.6    OSA   - STOP-BANG 4 points (male, age, observed apnea, snoring).  - PSG 12/29/2020   CPAP at level of 10 cm H2O with expiratory pressure relief (EPR) = 3 and with heated humidifier for nasal dryness, mask: AirFit N20 size medium or mask of patient???s preference.  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  - pH probe with DeMeester 16.7%, slightly in abnormal range but will hold off on surgical intervention at this time in the setting of normal graft function.  - Daily PPI   - no reflux symptoms, no difficulty swallowing    BPH   - terazosin  3mg  at bedtime  - UA completed at prior appt, benign.  - Normal CEA and PSA  - Follows with urology    Iron  Deficiency Anemia   Iron  saturation 9%  - receives periodic iron  infusions    Hypomagnesemia   - magnesium  oxide 800 mg BID    Type 2 diabetes mellitus   -  metformin  1000mg  BID  - Januvia  100  - PVL ABI and carotid duplex normal 11/2021  - fasting BG 110-120  - Last A1c (08/26/23) 8.0; discussed dietary changes; further education with dietician today 08/26/23    Bone Health:   - Last Dexa: low bone mass on DEXA 11/2022, +1.5% at hip, +2.1% femoral neck  - on Ca and Vit D supplementation currently  - alendronate  70mg  weekly  - Dexa scan is scheduled 02/24/24    H/o colon cancer in 2007 s/p partial colon and hepatic wedge resection and FOLFOX  Last colonoscopy 08/10/23 with 5 polyps (adenoma)  - due in 08/2026    Health Maintenance:  - Last derm visit:  12/2021  - Last Colonsocopy (if >40):   08/2023 (5 polyps, adenoma), repeat 3 in 3 yrs. need repeat TBD  - Last flu shot:   02/19/2022  - RSV vaccine:   Completed locally 06/2022  - Last Pneumovax:  06/10/2004, 12/30/2017  - Last Prevnar:  03/25/2016, 05/27/23  - TdaP:   02/11/2018  - Shingrix :    03/10/20, 08/20/20  - COVID-19 (Moderna) 06/11/19, 07/09/19, 03/10/20, 07/18/2020, 12/03/20 received 1/2 dose Evushield,03/2021 (Bivalent); SpikeVax 02/19/2022 and 08/27/22, 02/2023. 08/26/23  - Last Dexa:   02/2020, 11/17/21 -> T-score -2.2 FRAX score 8%, July 2024 FRAX   - Last vitamin D:  66.9 05/2021, recheck 08/26/23.  - Last HbA1c:   (08/26/23) 8.0     Complex medical decision making was done as we adjust medications based on blood work/drug levels and multiple complaints and problems were addressed. Patient was seen and discussed by Dr. Curlene.     Durel Parry, MD  Pulmonary and Critical Care Fellow

## 2024-02-24 ENCOUNTER — Inpatient Hospital Stay: Admit: 2024-02-24 | Discharge: 2024-02-24 | Payer: Medicare (Managed Care)

## 2024-02-24 ENCOUNTER — Ambulatory Visit: Admit: 2024-02-24 | Discharge: 2024-02-24 | Payer: Medicare (Managed Care)

## 2024-02-24 DIAGNOSIS — Z7952 Long term (current) use of systemic steroids: Principal | ICD-10-CM

## 2024-02-24 DIAGNOSIS — Z23 Encounter for immunization: Principal | ICD-10-CM

## 2024-02-24 DIAGNOSIS — D509 Iron deficiency anemia, unspecified: Principal | ICD-10-CM

## 2024-02-24 DIAGNOSIS — Z942 Lung transplant status: Principal | ICD-10-CM

## 2024-02-24 LAB — COMPREHENSIVE METABOLIC PANEL
ALBUMIN: 4 g/dL (ref 3.4–5.0)
ALKALINE PHOSPHATASE: 68 U/L (ref 46–116)
ALT (SGPT): 19 U/L (ref 10–49)
ANION GAP: 11 mmol/L (ref 5–14)
AST (SGOT): 25 U/L (ref <=34)
BILIRUBIN TOTAL: 0.5 mg/dL (ref 0.3–1.2)
BLOOD UREA NITROGEN: 26 mg/dL — ABNORMAL HIGH (ref 9–23)
BUN / CREAT RATIO: 18
CALCIUM: 9.5 mg/dL (ref 8.7–10.4)
CHLORIDE: 103 mmol/L (ref 98–107)
CO2: 29 mmol/L (ref 20.0–31.0)
CREATININE: 1.48 mg/dL — ABNORMAL HIGH (ref 0.73–1.18)
EGFR CKD-EPI (2021) MALE: 50 mL/min/1.73m2 — ABNORMAL LOW (ref >=60)
GLUCOSE RANDOM: 127 mg/dL (ref 70–179)
POTASSIUM: 3.9 mmol/L (ref 3.5–5.1)
PROTEIN TOTAL: 7.1 g/dL (ref 5.7–8.2)
SODIUM: 143 mmol/L (ref 135–145)

## 2024-02-24 LAB — CBC W/ AUTO DIFF
BASOPHILS ABSOLUTE COUNT: 0 10*9/L (ref 0.0–0.1)
BASOPHILS RELATIVE PERCENT: 0.7 %
EOSINOPHILS ABSOLUTE COUNT: 0.1 10*9/L (ref 0.0–0.5)
EOSINOPHILS RELATIVE PERCENT: 2.4 %
HEMATOCRIT: 36.7 % — ABNORMAL LOW (ref 39.0–48.0)
HEMOGLOBIN: 11.5 g/dL — ABNORMAL LOW (ref 12.9–16.5)
LYMPHOCYTES ABSOLUTE COUNT: 0.7 10*9/L — ABNORMAL LOW (ref 1.1–3.6)
LYMPHOCYTES RELATIVE PERCENT: 17.4 %
MEAN CORPUSCULAR HEMOGLOBIN CONC: 31.3 g/dL — ABNORMAL LOW (ref 32.0–36.0)
MEAN CORPUSCULAR HEMOGLOBIN: 24.9 pg — ABNORMAL LOW (ref 25.9–32.4)
MEAN CORPUSCULAR VOLUME: 79.5 fL (ref 77.6–95.7)
MEAN PLATELET VOLUME: 8.3 fL (ref 6.8–10.7)
MONOCYTES ABSOLUTE COUNT: 0.5 10*9/L (ref 0.3–0.8)
MONOCYTES RELATIVE PERCENT: 11.6 %
NEUTROPHILS ABSOLUTE COUNT: 2.8 10*9/L (ref 1.8–7.8)
NEUTROPHILS RELATIVE PERCENT: 67.9 %
PLATELET COUNT: 198 10*9/L (ref 150–450)
RED BLOOD CELL COUNT: 4.61 10*12/L (ref 4.26–5.60)
RED CELL DISTRIBUTION WIDTH: 18.3 % — ABNORMAL HIGH (ref 12.2–15.2)
WBC ADJUSTED: 4.2 10*9/L (ref 3.6–11.2)

## 2024-02-24 LAB — LIPID PANEL
CHOLESTEROL: 115 mg/dL (ref <200)
HDL CHOLESTEROL: 54 mg/dL (ref >40)
LDL CHOLESTEROL CALCULATED: 46 mg/dL (ref <100)
NON-HDL CHOLESTEROL: 61 mg/dL (ref <130)
TRIGLYCERIDES: 113 mg/dL (ref <150)

## 2024-02-24 LAB — SLIDE REVIEW

## 2024-02-24 LAB — IRON PANEL
IRON SATURATION: 9 % — ABNORMAL LOW (ref 20–55)
IRON: 37 ug/dL — ABNORMAL LOW (ref 65–175)
TOTAL IRON BINDING CAPACITY: 396 ug/dL (ref 250–425)

## 2024-02-24 LAB — HEMOGLOBIN A1C
ESTIMATED AVERAGE GLUCOSE: 146 mg/dL
HEMOGLOBIN A1C: 6.7 % — ABNORMAL HIGH (ref 4.8–5.6)

## 2024-02-24 LAB — IGG: GAMMAGLOBULIN; IGG: 890 mg/dL (ref 650–1600)

## 2024-02-24 LAB — FERRITIN: FERRITIN: 11.2 ng/mL (ref 10.5–307.3)

## 2024-02-24 LAB — TACROLIMUS LEVEL: TACROLIMUS BLOOD: 5.6 ng/mL

## 2024-02-24 NOTE — Unmapped (Signed)
 In with Dr. Curlene to see Spencer LELON Silversmith Sr.. Doing well, PFTs stable. Due for dexa scan.  Needs iron  infusion. Will receive covid and flu vaccines.  Return to clinic in 3 months. Questions answered, verbalized understanding.

## 2024-02-24 NOTE — Unmapped (Signed)
 Called Spencer Pena. to discuss lab results. Tacrolimus level was normal at 5.6 with a goal of 5-7. Medication regimen is currently 1 mg BID. No changes made, level within normal range.  dosing at this time to remain the same. Repeat labs scheduled monthly. Resulted labs reviewed. All questions answered. Pt verbalized understanding.

## 2024-02-24 NOTE — Unmapped (Signed)
 Per provider, the patient received 2 vaccine.  Patient ID verified with name and date of birth.  All screening questions were answered.  Vaccine(s) were administered as ordered.  See immunization history for documentation.  Patient tolerated the injection(s) well with no issues noted.  Vaccine Information sheet given to the patient.

## 2024-02-25 LAB — CMV DNA, QUANTITATIVE, PCR: CMV VIRAL LD: NOT DETECTED

## 2024-02-25 LAB — EBV QUANTITATIVE PCR, BLOOD: EBV VIRAL LOAD RESULT: NOT DETECTED

## 2024-02-28 LAB — CMV T-CELL IMMUNITY PANEL

## 2024-02-29 DIAGNOSIS — J439 Emphysema, unspecified: Principal | ICD-10-CM

## 2024-02-29 DIAGNOSIS — Z942 Lung transplant status: Principal | ICD-10-CM

## 2024-02-29 MED ORDER — ASPIRIN 81 MG TABLET,DELAYED RELEASE
ORAL_TABLET | Freq: Every day | ORAL | 3 refills | 90.00000 days | Status: CP
Start: 2024-02-29 — End: ?
  Filled 2024-03-05: qty 90, 90d supply, fill #0

## 2024-02-29 MED ORDER — ALBUTEROL SULFATE HFA 90 MCG/ACTUATION AEROSOL INHALER
RESPIRATORY_TRACT | 2 refills | 25.00000 days | Status: CP | PRN
Start: 2024-02-29 — End: ?
  Filled 2024-03-05: qty 8.5, 16d supply, fill #0

## 2024-02-29 MED ORDER — CALCIUM 600 MG (AS CALCIUM CARBONATE 1,500 MG) TABLET
ORAL_TABLET | Freq: Two times a day (BID) | ORAL | 11 refills | 30.00000 days | Status: CP
Start: 2024-02-29 — End: ?
  Filled 2024-03-05: qty 60, 30d supply, fill #0

## 2024-02-29 MED ORDER — FLUTICASONE PROPIONATE 50 MCG/ACTUATION NASAL SPRAY,SUSPENSION
Freq: Every day | NASAL | 11 refills | 60.00000 days | Status: CP
Start: 2024-02-29 — End: ?
  Filled 2024-03-05: qty 16, 30d supply, fill #0

## 2024-02-29 MED ORDER — ATORVASTATIN 40 MG TABLET
ORAL_TABLET | Freq: Every day | ORAL | 3 refills | 90.00000 days | Status: CP
Start: 2024-02-29 — End: 2025-02-28
  Filled 2024-03-05: qty 90, 90d supply, fill #0

## 2024-02-29 NOTE — Unmapped (Signed)
 Pt request for RX Refill

## 2024-03-01 MED FILL — PREDNISONE 5 MG TABLET: ORAL | 90 days supply | Qty: 90 | Fill #2

## 2024-03-05 LAB — HLA DS POST TRANSPLANT
ANTI-DONOR DRW #1 MFI: 109 MFI
ANTI-DONOR DRW #2 MFI: 109 MFI
ANTI-DONOR HLA-A #1 MFI: 0 MFI
ANTI-DONOR HLA-A #2 MFI: 0 MFI
ANTI-DONOR HLA-B #1 MFI: 0 MFI
ANTI-DONOR HLA-B #2 MFI: 0 MFI
ANTI-DONOR HLA-C #2 MFI: 0 MFI
ANTI-DONOR HLA-DQB #1 MFI: 251 MFI
ANTI-DONOR HLA-DQB #2 MFI: 0 MFI
ANTI-DONOR HLA-DR #1 MFI: 0 MFI
ANTI-DONOR HLA-DR #2 MFI: 0 MFI

## 2024-03-05 LAB — FSAB CLASS 2 ANTIBODY SPECIFICITY: HLA CL2 AB RESULT: POSITIVE

## 2024-03-05 LAB — FSAB CLASS 1 ANTIBODY SPECIFICITY: HLA CLASS 1 ANTIBODY RESULT: NEGATIVE

## 2024-03-09 NOTE — Progress Notes (Signed)
 [facility] Delivery Pharm[facility] Refill Coordination Note    Specialty Medication(s) to be Shipped:   Transplant: tacrolimus  0.5 mg    Other medication(s) to be shipped: acetaminophen , amlodipine , valganciclovir     Specialty Medications not needed at this time: N/A     Spencer BROSH Sr., DOB: 09-Feb-1951  Phone: [phone] (home)       All above HIPAA information was verified with patient.     Was a nurse, learning disability used for this call? No    Completed refill call assessment today to schedule patient's medication shipment from the [facility] Delivery Pharmacy  [phone]).  All relevant notes have been reviewed.     Specialty medication(s) and dose(s) confirmed: Regimen is correct and unchanged.   Changes to medications: [name] reports no changes at this time.  Changes to insurance: No  New side effects reported not previously addressed with a pharmacist or physician: None reported  Questions for the pharmacist: No    Confirmed patient received a Conservation Officer, Historic Buildings and a Surveyor, Mining with first shipment. The patient will receive a drug information handout for each medication shipped and additional FDA Medication Guides as required.       DISEASE/MEDICATION-SPECIFIC INFORMATION        N/A    SPECIALTY MEDICATION ADHERENCE     Medication Adherence    Patient reported X missed doses in the last month: 0  Specialty Medication: tacrolimus  0.5 MG capsule (PROGRAF )  Patient is on additional specialty medications: No              Were doses missed due to medication being on hold? No    tacrolimus  0.5 MG capsule (PROGRAF )  10 days of medicine on hand       REFERRAL TO PHARMACIST     Referral to the pharmacist: Not needed      [facility]     Shipping address confirmed in Epic.     Cost and Payment: Patient has a copay of $50.27. They are aware and have authorized the pharmacy to charge the credit card on file.    Delivery Scheduled: Yes, Expected medication delivery date: 03/14/2024.     Medication will be delivered via Next Day Courier to the prescription address in Epic WAM.    [facility]  Specialty Technician

## 2024-03-13 DIAGNOSIS — Z942 Lung transplant status: Principal | ICD-10-CM

## 2024-03-13 MED FILL — AMLODIPINE 5 MG TABLET: ORAL | 90 days supply | Qty: 90 | Fill #2

## 2024-03-13 MED FILL — VALGANCICLOVIR 450 MG TABLET: ORAL | 60 days supply | Qty: 60 | Fill #4

## 2024-03-13 MED FILL — TACROLIMUS 0.5 MG CAPSULE, IMMEDIATE-RELEASE: ORAL | 90 days supply | Qty: 360 | Fill #1

## 2024-03-13 MED FILL — ACETAMINOPHEN 500 MG TABLET: ORAL | 23 days supply | Qty: 180 | Fill #4

## 2024-03-20 DIAGNOSIS — Z942 Lung transplant status: Principal | ICD-10-CM

## 2024-03-20 DIAGNOSIS — D509 Iron deficiency anemia, unspecified: Principal | ICD-10-CM

## 2024-03-21 ENCOUNTER — Encounter: Admit: 2024-03-21 | Discharge: 2024-03-22 | Payer: Medicare (Managed Care)

## 2024-03-21 LAB — CBC W/ AUTO DIFF
BASOPHILS ABSOLUTE COUNT: 0 10*9/L (ref 0.0–0.1)
BASOPHILS RELATIVE PERCENT: 0.7 %
EOSINOPHILS ABSOLUTE COUNT: 0.1 10*9/L (ref 0.0–0.5)
EOSINOPHILS RELATIVE PERCENT: 2.1 %
HEMATOCRIT: 34.2 % — ABNORMAL LOW (ref 39.0–48.0)
HEMOGLOBIN: 11.1 g/dL — ABNORMAL LOW (ref 12.9–16.5)
LYMPHOCYTES ABSOLUTE COUNT: 0.6 10*9/L — ABNORMAL LOW (ref 1.1–3.6)
LYMPHOCYTES RELATIVE PERCENT: 14.7 %
MEAN CORPUSCULAR HEMOGLOBIN CONC: 32.5 g/dL (ref 32.0–36.0)
MEAN CORPUSCULAR HEMOGLOBIN: 25.5 pg — ABNORMAL LOW (ref 25.9–32.4)
MEAN CORPUSCULAR VOLUME: 78.3 fL (ref 77.6–95.7)
MEAN PLATELET VOLUME: 8.1 fL (ref 6.8–10.7)
MONOCYTES ABSOLUTE COUNT: 0.4 10*9/L (ref 0.3–0.8)
MONOCYTES RELATIVE PERCENT: 10.1 %
NEUTROPHILS ABSOLUTE COUNT: 2.9 10*9/L (ref 1.8–7.8)
NEUTROPHILS RELATIVE PERCENT: 72.4 %
PLATELET COUNT: 181 10*9/L (ref 150–450)
RED BLOOD CELL COUNT: 4.37 10*12/L (ref 4.26–5.60)
RED CELL DISTRIBUTION WIDTH: 17.9 % — ABNORMAL HIGH (ref 12.2–15.2)
WBC ADJUSTED: 4 10*9/L (ref 3.6–11.2)

## 2024-03-21 LAB — FERRITIN: FERRITIN: 7.3 ng/mL — ABNORMAL LOW (ref 10.5–307.3)

## 2024-03-21 LAB — COMPREHENSIVE METABOLIC PANEL
ALBUMIN: 4 g/dL (ref 3.4–5.0)
ALKALINE PHOSPHATASE: 77 U/L (ref 46–116)
ALT (SGPT): 17 U/L (ref 10–49)
ANION GAP: 13 mmol/L (ref 5–14)
AST (SGOT): 31 U/L (ref <=34)
BILIRUBIN TOTAL: 0.3 mg/dL (ref 0.3–1.2)
BLOOD UREA NITROGEN: 21 mg/dL (ref 9–23)
BUN / CREAT RATIO: 15
CALCIUM: 9.6 mg/dL (ref 8.7–10.4)
CHLORIDE: 103 mmol/L (ref 98–107)
CO2: 27 mmol/L (ref 20.0–31.0)
CREATININE: 1.4 mg/dL — ABNORMAL HIGH (ref 0.73–1.18)
EGFR CKD-EPI (2021) MALE: 53 mL/min/1.73m2 — ABNORMAL LOW (ref >=60)
GLUCOSE RANDOM: 122 mg/dL — ABNORMAL HIGH (ref 70–99)
POTASSIUM: 4 mmol/L (ref 3.4–4.8)
PROTEIN TOTAL: 7.1 g/dL (ref 5.7–8.2)
SODIUM: 143 mmol/L (ref 135–145)

## 2024-03-21 LAB — IGG: GAMMAGLOBULIN; IGG: 764 mg/dL (ref 650–1600)

## 2024-03-21 LAB — TACROLIMUS LEVEL: TACROLIMUS BLOOD: 4.8 ng/mL

## 2024-03-21 LAB — MAGNESIUM: MAGNESIUM: 1.7 mg/dL (ref 1.6–2.6)

## 2024-03-21 LAB — PHOSPHORUS: PHOSPHORUS: 3.5 mg/dL (ref 2.4–5.1)

## 2024-03-21 LAB — IRON PANEL
IRON SATURATION: 5 % — ABNORMAL LOW (ref 20–55)
IRON: 19 ug/dL — ABNORMAL LOW (ref 65–175)
TOTAL IRON BINDING CAPACITY: 407 ug/dL (ref 250–425)

## 2024-03-21 MED ADMIN — diphenhydrAMINE (BENADRYL) capsule/tablet 50 mg: 50 mg | ORAL | @ 14:00:00 | Stop: 2024-03-21

## 2024-03-21 MED ADMIN — iron dextran (INFED) 25 mg in sodium chloride (NS) 0.9 % 25 mL IVPB: 25 mg | INTRAVENOUS | @ 14:00:00 | Stop: 2024-03-21

## 2024-03-21 MED ADMIN — acetaminophen (TYLENOL) tablet 650 mg: 650 mg | ORAL | @ 14:00:00 | Stop: 2024-03-21

## 2024-03-21 MED ADMIN — iron dextran (INFED) 1,000 mg in sodium chloride (NS) 0.9 % 250 mL IVPB: 1000 mg | INTRAVENOUS | @ 16:00:00 | Stop: 2024-03-21

## 2024-03-21 NOTE — Telephone Encounter (Signed)
 Called Spencer LELON Silversmith Sr. to discuss lab results. Tacrolimus  level was normal at 4.8 with a goal of 5-7. Medication regimen is currently 1 mg BID. No changes made, level within normal range.  dosing at this time to remain the same. Repeat labs scheduled monthly. Resulted labs reviewed. All questions answered. Pt verbalized understanding.

## 2024-03-21 NOTE — Progress Notes (Signed)
 Pt ID verified with Name and Date of birth. All screening questions answered. Vaccine Information Sheet given to patient prior to injection.   Vaccine(s) administered as ordered. See Immunization history for documentation. Pt tolerated the injection(s) well with no issues noted.

## 2024-03-21 NOTE — Progress Notes (Signed)
 9161 Patient arrived to the Transplant Infusion room scheduled to receive Dextran 1025 mg, Condtion: well; Mobility: ambulating; accompanied by self.   See Flowsheet and MAR for all details of visit.    0840 VS stable.  9152 Premedications given.  0900 PIV placed and secured with coban, labs collected and sent, urine no orders found.  9070 Test dose infusion initiated. Patient educated to notify nursing staff if they experience urticaria, erythema, shortness of breath, nausea, metal taste in their mouth, excessive oral or postnasal drainage and any other changes in status which could be side effects from initiating this medication.  0950 Infusion complete.   1128 Dextran 1000 mg infusion initiated.  1228 Dextran infusion completed  1303 Post infusion observation period complete, patient tolerated infusion well and without symptoms of reaction.  1305 [name] Statistician at the bedside  1310 VS stable.  1332 PIV removed and secured with coban.  1335 Patient left clinic today feeling well, mobility: ambulating, accompanied by self.

## 2024-04-12 MED FILL — ALENDRONATE 70 MG TABLET: ORAL | 84 days supply | Qty: 12 | Fill #2

## 2024-04-20 MED FILL — JANUVIA 100 MG TABLET: ORAL | 90 days supply | Qty: 90 | Fill #2

## 2024-04-20 MED FILL — MONTELUKAST 10 MG TABLET: ORAL | 90 days supply | Qty: 90 | Fill #3

## 2024-04-20 MED FILL — AZATHIOPRINE 50 MG TABLET: ORAL | 90 days supply | Qty: 90 | Fill #2

## 2024-04-20 MED FILL — METFORMIN 1,000 MG TABLET: ORAL | 90 days supply | Qty: 180 | Fill #2

## 2024-04-26 ENCOUNTER — Ambulatory Visit: Admit: 2024-04-26 | Discharge: 2024-04-27 | Payer: Medicare (Managed Care)

## 2024-04-26 LAB — CBC W/ AUTO DIFF
BASOPHILS ABSOLUTE COUNT: 0 10*9/L (ref 0.0–0.1)
BASOPHILS RELATIVE PERCENT: 0.5 %
EOSINOPHILS ABSOLUTE COUNT: 0.1 10*9/L (ref 0.0–0.5)
EOSINOPHILS RELATIVE PERCENT: 1.7 %
HEMATOCRIT: 38.5 % — ABNORMAL LOW (ref 39.0–48.0)
HEMOGLOBIN: 12.8 g/dL — ABNORMAL LOW (ref 12.9–16.5)
LYMPHOCYTES ABSOLUTE COUNT: 1 10*9/L — ABNORMAL LOW (ref 1.1–3.6)
LYMPHOCYTES RELATIVE PERCENT: 21.7 %
MEAN CORPUSCULAR HEMOGLOBIN CONC: 33.3 g/dL (ref 32.0–36.0)
MEAN CORPUSCULAR HEMOGLOBIN: 28 pg (ref 25.9–32.4)
MEAN CORPUSCULAR VOLUME: 84.1 fL (ref 77.6–95.7)
MEAN PLATELET VOLUME: 8.2 fL (ref 6.8–10.7)
MONOCYTES ABSOLUTE COUNT: 0.4 10*9/L (ref 0.3–0.8)
MONOCYTES RELATIVE PERCENT: 9.1 %
NEUTROPHILS ABSOLUTE COUNT: 3.2 10*9/L (ref 1.8–7.8)
NEUTROPHILS RELATIVE PERCENT: 67 %
PLATELET COUNT: 162 10*9/L (ref 150–450)
RED BLOOD CELL COUNT: 4.58 10*12/L (ref 4.26–5.60)
RED CELL DISTRIBUTION WIDTH: 23.7 % — ABNORMAL HIGH (ref 12.2–15.2)
WBC ADJUSTED: 4.7 10*9/L (ref 3.6–11.2)

## 2024-04-26 LAB — COMPREHENSIVE METABOLIC PANEL
ALBUMIN: 4.2 g/dL (ref 3.4–5.0)
ALKALINE PHOSPHATASE: 68 U/L (ref 46–116)
ALT (SGPT): 24 U/L (ref 10–49)
ANION GAP: 11 mmol/L (ref 5–14)
AST (SGOT): 25 U/L (ref <=34)
BILIRUBIN TOTAL: 0.5 mg/dL (ref 0.3–1.2)
BLOOD UREA NITROGEN: 17 mg/dL (ref 9–23)
BUN / CREAT RATIO: 12
CALCIUM: 10.4 mg/dL (ref 8.7–10.4)
CHLORIDE: 102 mmol/L (ref 98–107)
CO2: 30 mmol/L (ref 20.0–31.0)
CREATININE: 1.41 mg/dL — ABNORMAL HIGH (ref 0.73–1.18)
EGFR CKD-EPI (2021) MALE: 53 mL/min/1.73m2 — ABNORMAL LOW (ref >=60)
GLUCOSE RANDOM: 148 mg/dL (ref 70–179)
POTASSIUM: 4.4 mmol/L (ref 3.4–4.8)
PROTEIN TOTAL: 7.1 g/dL (ref 5.7–8.2)
SODIUM: 143 mmol/L (ref 135–145)

## 2024-04-26 LAB — SLIDE REVIEW

## 2024-04-26 LAB — PHOSPHORUS: PHOSPHORUS: 2.4 mg/dL (ref 2.4–5.1)

## 2024-04-26 LAB — IGG: GAMMAGLOBULIN; IGG: 738 mg/dL (ref 650–1600)

## 2024-04-26 LAB — TACROLIMUS LEVEL: TACROLIMUS BLOOD: 4.6 ng/mL

## 2024-04-26 LAB — MAGNESIUM: MAGNESIUM: 1.9 mg/dL (ref 1.6–2.6)

## 2024-04-26 NOTE — Telephone Encounter (Signed)
 Called Spencer LELON Silversmith Sr. to discuss lab results. Tacrolimus  level was essentially normal at 4.6 with a goal of 5-7. Medication regimen is currently 1 mg BID. No changes made, level within normal range.  dosing at this time to remain the same. Repeat labs scheduled monthly. Resulted labs reviewed. All questions answered. Pt verbalized understanding.

## 2024-05-08 MED FILL — SULFAMETHOXAZOLE 400 MG-TRIMETHOPRIM 80 MG TABLET: ORAL | 84 days supply | Qty: 36 | Fill #1

## 2024-05-08 MED FILL — CALCIUM 600 MG (AS CALCIUM CARBONATE 1,500 MG) TABLET: ORAL | 30 days supply | Qty: 60 | Fill #1

## 2024-05-15 DIAGNOSIS — Z942 Lung transplant status: Principal | ICD-10-CM

## 2024-05-18 MED FILL — PANTOPRAZOLE 40 MG TABLET,DELAYED RELEASE: ORAL | 90 days supply | Qty: 90 | Fill #1

## 2024-05-18 MED FILL — VALGANCICLOVIR 450 MG TABLET: ORAL | 60 days supply | Qty: 60 | Fill #5

## 2024-05-24 DIAGNOSIS — Z942 Lung transplant status: Principal | ICD-10-CM

## 2024-05-25 ENCOUNTER — Inpatient Hospital Stay: Admit: 2024-05-25 | Discharge: 2024-05-25 | Payer: Medicare (Managed Care)

## 2024-05-25 ENCOUNTER — Ambulatory Visit: Admit: 2024-05-25 | Discharge: 2024-05-25 | Payer: Medicare (Managed Care)

## 2024-05-25 ENCOUNTER — Ambulatory Visit
Admit: 2024-05-25 | Discharge: 2024-05-25 | Payer: Medicare (Managed Care) | Attending: Internal Medicine | Primary: Internal Medicine

## 2024-05-25 ENCOUNTER — Ambulatory Visit
Admit: 2024-05-25 | Discharge: 2024-05-25 | Payer: Medicare (Managed Care) | Attending: Pharmacist Clinician (PhC)/ Clinical Pharmacy Specialist | Primary: Pharmacist Clinician (PhC)/ Clinical Pharmacy Specialist

## 2024-05-25 DIAGNOSIS — Z942 Lung transplant status: Principal | ICD-10-CM

## 2024-05-25 LAB — COMPREHENSIVE METABOLIC PANEL
ALBUMIN: 4.2 g/dL (ref 3.4–5.0)
ALKALINE PHOSPHATASE: 72 U/L (ref 46–116)
ALT (SGPT): 25 U/L (ref 10–49)
ANION GAP: 7 mmol/L (ref 5–14)
AST (SGOT): 28 U/L (ref <=34)
BILIRUBIN TOTAL: 0.5 mg/dL (ref 0.3–1.2)
BLOOD UREA NITROGEN: 21 mg/dL (ref 9–23)
BUN / CREAT RATIO: 16
CALCIUM: 9.8 mg/dL (ref 8.7–10.4)
CHLORIDE: 105 mmol/L (ref 98–107)
CO2: 31 mmol/L (ref 20.0–31.0)
CREATININE: 1.33 mg/dL — ABNORMAL HIGH (ref 0.73–1.18)
EGFR CKD-EPI (2021) MALE: 56 mL/min/1.73m2 — ABNORMAL LOW (ref >=60)
GLUCOSE RANDOM: 125 mg/dL (ref 70–179)
POTASSIUM: 3.9 mmol/L (ref 3.4–4.8)
PROTEIN TOTAL: 7.2 g/dL (ref 5.7–8.2)
SODIUM: 143 mmol/L (ref 135–145)

## 2024-05-25 LAB — PHOSPHORUS: PHOSPHORUS: 3.4 mg/dL (ref 2.4–5.1)

## 2024-05-25 LAB — CBC W/ AUTO DIFF
BASOPHILS ABSOLUTE COUNT: 0 10*9/L (ref 0.0–0.1)
BASOPHILS RELATIVE PERCENT: 0.7 %
EOSINOPHILS ABSOLUTE COUNT: 0.1 10*9/L (ref 0.0–0.5)
EOSINOPHILS RELATIVE PERCENT: 1.7 %
HEMATOCRIT: 42 % (ref 39.0–48.0)
HEMOGLOBIN: 14 g/dL (ref 12.9–16.5)
LYMPHOCYTES ABSOLUTE COUNT: 0.8 10*9/L — ABNORMAL LOW (ref 1.1–3.6)
LYMPHOCYTES RELATIVE PERCENT: 17.2 %
MEAN CORPUSCULAR HEMOGLOBIN CONC: 33.4 g/dL (ref 32.0–36.0)
MEAN CORPUSCULAR HEMOGLOBIN: 28.9 pg (ref 25.9–32.4)
MEAN CORPUSCULAR VOLUME: 86.7 fL (ref 77.6–95.7)
MEAN PLATELET VOLUME: 8 fL (ref 6.8–10.7)
MONOCYTES ABSOLUTE COUNT: 0.4 10*9/L (ref 0.3–0.8)
MONOCYTES RELATIVE PERCENT: 9.5 %
NEUTROPHILS ABSOLUTE COUNT: 3.2 10*9/L (ref 1.8–7.8)
NEUTROPHILS RELATIVE PERCENT: 70.9 %
PLATELET COUNT: 144 10*9/L — ABNORMAL LOW (ref 150–450)
RED BLOOD CELL COUNT: 4.84 10*12/L (ref 4.26–5.60)
RED CELL DISTRIBUTION WIDTH: 23.4 % — ABNORMAL HIGH (ref 12.2–15.2)
WBC ADJUSTED: 4.4 10*9/L (ref 3.6–11.2)

## 2024-05-25 LAB — CMV DNA, QUANTITATIVE, PCR: CMV VIRAL LD: NOT DETECTED

## 2024-05-25 LAB — EBV QUANTITATIVE PCR, BLOOD: EBV VIRAL LOAD RESULT: NOT DETECTED

## 2024-05-25 LAB — MAGNESIUM: MAGNESIUM: 1.6 mg/dL (ref 1.6–2.6)

## 2024-05-25 LAB — IGG: GAMMAGLOBULIN; IGG: 743 mg/dL (ref 650–1600)

## 2024-05-25 LAB — TACROLIMUS LEVEL: TACROLIMUS BLOOD: 5.6 ng/mL

## 2024-05-25 LAB — SLIDE REVIEW

## 2024-05-25 MED ORDER — MAGNESIUM OXIDE 400 MG (241.3 MG MAGNESIUM) TABLET
ORAL_TABLET | Freq: Every evening | ORAL | 3 refills | 90.00000 days | Status: CP
Start: 2024-05-25 — End: 2025-05-25

## 2024-05-25 MED ORDER — FLUTICASONE PROPIONATE 115 MCG-SALMETEROL 21 MCG/ACTUATION HFA INHALER
Freq: Two times a day (BID) | RESPIRATORY_TRACT | 3 refills | 90.00000 days | Status: CP
Start: 2024-05-25 — End: ?

## 2024-05-25 MED ORDER — MONTELUKAST 10 MG TABLET
ORAL_TABLET | Freq: Every day | ORAL | 3 refills | 90.00000 days | Status: CP
Start: 2024-05-25 — End: 2025-05-25

## 2024-05-25 MED ORDER — CALCIUM 600 MG (AS CALCIUM CARBONATE 1,500 MG) TABLET
ORAL_TABLET | Freq: Two times a day (BID) | ORAL | 3 refills | 90.00000 days | Status: CP
Start: 2024-05-25 — End: ?

## 2024-05-25 MED ORDER — AMLODIPINE 5 MG TABLET
ORAL_TABLET | Freq: Every day | ORAL | 3 refills | 90.00000 days | Status: CP
Start: 2024-05-25 — End: 2025-05-25

## 2024-05-25 MED ORDER — AZATHIOPRINE 50 MG TABLET
ORAL_TABLET | Freq: Every day | ORAL | 3 refills | 90.00000 days | Status: CP
Start: 2024-05-25 — End: ?

## 2024-05-25 MED ORDER — VALGANCICLOVIR 450 MG TABLET
ORAL_TABLET | Freq: Every day | ORAL | 3 refills | 90.00000 days | Status: CP
Start: 2024-05-25 — End: ?

## 2024-05-25 NOTE — Progress Notes (Signed)
 Pulmonary Transplant Clinic    HISTORY:   HPI:  Mr. Lincon Sahlin is a 74 y.o.  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    Interval Events 05/25/2024  - Acute complaints today: no complaints today, doing overall very well. He works three days a week still (M-W) at [employer]. He had a lovely holiday with his family. In the works are lots of fun trips with grandkids.   - Respiratory symptoms: denies fever, cough, congestion, decreased exercise tolerance/DOE, shortness of breath.   - Nutrition: weight is stable. Reports PO intake is stable, no issues with diarrhea, constipation, nausea/vomiting.   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     --> basiliximab induction    --> CMV high risk (D+/R-)    --> EBV moderate risk (D+/R+)    --> Not Increased Risk Donor  - Colon Cancer 2007    --> s/p resection of liver mets    --> s/p resection of 6 inches of colon    --> FOLFOX Chemo  - type 2 diabetes mellitus     --> not insulin  dependent  - OA  - Bicept tear    SOCIAL HISTORY:   - 20pk years, quit in 1991  - previously drank about 10 drinks per week   - Worked maintenance at [employer]  - Married and lives in [place]    FAMILY HISTORY:   - Parents lived into late 11's  - Father had colon cancer  - No siblings had colon cancer    MEDS: (personally reviewed in EPIC, pertinent meds noted below)         OBJECTIVE DATA:   PHYSICAL EXAM:  BP 149/73 (BP Site: L Arm, BP Position: Sitting)  - Pulse 75  - Temp 36.5 ??C (97.7 ??F) (Oral)  - Ht 170.2 cm (5' 7)  - Wt 79.9 kg (176 lb 3.2 oz)  - SpO2 99%  - BMI 27.60 kg/m??   General: No acute distress, well-nourished, well-groomed   HEENT: Moist mucus membranes, no mucosal or conjunctival pallor  Respiratory: respirations even/unlabored, lungs are clear bilaterally with no wheezing or crackles  Cardiovascular: Regular rate and rhythm  Extremities: Moves all extremities well, normal capillary refill; RUE palpable mass over biceps c/w historical tendon rupture  Integumentary: Intact, warm, dry no rashes  Neurologic: Alert & Oriented x 3, no cognitive impairment, no focal deficits, normal gait/station  Psychiatric: Age appropriate, logical thought, full range of affect      LABS: (reviewed in Epic, pertinent values noted below)     PFTs: (personally reviewed and interpereted):    Date: FVC (% Pred) FEV1 (% Pred) FEF25-75(% Pred) DLCO TBBx/Results   05/25/24 4.16 (121) 3.54 (136) 4.77 (242)     02/24/24 4.24 (123) 3.6 (138) 4.86 (245)     11/25/2023 4.11 (122%)  3.55 (137.7) 5.14     08/26/23 4.14 (122) 3.58 (139) 5.11 (258)     05/27/23 4.10 3.63      02/25/23 4.08 (120%) 3.55 (137%) 87 (112%)     11/19/22  4.11 3.68      08/27/22 4.20 (123%) 3.67 (141%) 5.59 (276.9%)  05/28/22 4.16 (121.5%) 3.69 (141.4%) 3.23 (307.1%)     02/19/2022 4.03 (110.4) 3.52 (126.7) 5.19 (241.3)     11/17/21 4.13 (113%) 3.63 (130%)      08/18/21 4.01 (109%) 3.55 (127%)      05/15/2021 4.21(114%) 3.65 (130.3%) 5.54 (254%)     04/06/2021 4.08 (111%) 3.64 (129.6%) 6.01 (274.3%)     12/31/20 3.95 (107.1%) 3.46 (122.6%) 5.20 (236.4%)     12/03/2020 3.87 (104.9%) 3.47 (122.9%) 5.69 (258.3%)     10/03/2020 3.68 (99.4) 3.13 (110.6)  4.22 (190.6)      09/05/20  3.70  3.14      08/20/20 3.65 (98%) 3.15 (111%) 4.69 (211%)      08/01/20 3.64 (98.2%) 3.10 (109.3%) 4.41 (198.8%)     07/18/20 3.49 (94%) 2.98 (105%) 4.20 (189%)     07/04/20 3.43 (92.5%) 2.97 (104.7%) 4.26 (191.7%)     06/27/2020 3.13 84.5 2.70 95.4 3.51 157.8     06/20/20 3.39 (91.4%) 2.82 (99.2%) 3.34 (150.35)     06/13/20 3.10 (72.8%) 2.66 (82.5%) 3.34 (136.2%)      05/15/20      BOLT    03/11/20  2.75 (72%)  2.36 (81%)       02/12/20  2.85 (75%)  2.44 (86%) IMAGING: Reviewed, unchanged from prior.     ASSESSMENT and PLAN     Mr. Denarius Sesler is a 74 y.o. 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 Currently taking 1mg  / 2mg     --> AZA 50 mg daily (lower dose due to pancytopenia); no MMF due to GI upset    --> PREDNISONE  5mg  DAILY esp in setting of high A1c  - BOS PPX    --> Azithromycin  250mg  daily    --> ASA 81mg  daily    --> Continue atorvastatin  02/2022    --> Continue Advair  BID (encouraged consistent use)    --> Montelukast  10mg  daily    Antimicrobial Prophylaxis:  - CMV, high risk (D+/R-): valganciclovir  450mg  daily indefinitely for Estimated Creatinine Clearance: 50.1 mL/min (A) (based on SCr of 1.33 mg/dL (H)).   Negative VL 12/29/2021. T cell assay with low immunity 09/2022, 02/2024  - EBV (D+/R+): negative 12/29/21  - Fungal: Posaconazole  stopped at 37mo  - PCP: Bactrim  SS MWF    Hypertension  - amlodipine  5mg  every day    CKD IIIa   - ranges between 1.1-1.6    OSA   - STOP-BANG 4 points (male, age, observed apnea, snoring).  - PSG 12/29/2020   CPAP at level of 10 cm H2O with expiratory pressure relief (EPR) = 3 and with heated humidifier for nasal dryness, mask: AirFit N20 size medium or mask of patient???s preference.  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  - pH probe with DeMeester 16.7%, slightly in abnormal range  but will hold off on surgical intervention at this time in the setting of normal graft function.  - Daily PPI   - no reflux symptoms, no difficulty swallowing    BPH   - terazosin  3mg  at bedtime  - Normal CEA and PSA  - Follows with urology    Iron  Deficiency Anemia, resolved  - CBC normal today  - receives periodic iron  infusions    Hypomagnesemia   - magnesium  oxide 800 mg BID    Type 2 diabetes mellitus   - last A1c 6.7 02/2024  - metformin  1000mg  BID  - Januvia  100mg     Bone Health:   - Last Dexa: low bone mass on DEXA 05/25/24 +5.4%  - on Ca and Vit D supplementation currently  - alendronate  70mg  weekly  - Dexa scan is scheduled 02/24/24    H/o colon cancer in 2007 s/p partial colon and hepatic wedge resection and FOLFOX  Last colonoscopy 08/10/23 with 5 polyps (adenoma)  - due in 08/2026    Health Maintenance:  - Last derm visit:  12/2021  - Last Colonsocopy (if >40):   08/2023 (5 polyps, adenoma), repeat in 3 yrs.   - Last flu shot:   03/21/24, 02/24/24  - RSV vaccine:   Completed locally 06/2022  - Last Pneumovax:  06/10/2004, 12/30/2017  - Last Prevnar:  03/25/2016, 05/27/23  - TdaP:   02/11/2018  - Shingrix :    03/10/20, 08/20/20  - COVID-19 (Moderna) 06/11/19, 07/09/19, 03/10/20, 07/18/2020, 12/03/20 received 1/2 dose Evushield,03/2021 (Bivalent); SpikeVax 02/19/2022 and 08/27/22, 02/2023. 08/26/23  - Last Dexa:   See separate problem above  - Last vitamin D:  53   - Last HbA1c:   See separate problem above     Complex medical decision making was done as we adjust medications based on blood work/drug levels and multiple complaints and problems were addressed. Patient was seen and discussed by Dr. Diana.     Guido LOISE Bullock, MD  Pulmonary and Critical Care Fellow

## 2024-05-25 NOTE — Patient Instructions (Signed)
 Follow up as scheduled.

## 2024-05-25 NOTE — Progress Notes (Signed)
 Please see patient pharmacy visit for documentation.

## 2024-05-25 NOTE — Progress Notes (Signed)
 See pulmonology note

## 2024-05-25 NOTE — Progress Notes (Signed)
 [facility] CLINIC PHARMACY NOTE  05/25/2024   Spencer MARKSON Sr.  [id number]    Medication changes today:   1. None    Education/Adherence tools provided today:  1. provided updated medication list    Follow up items:   Hgb A1c  Repeat DEXA  Repeat CMV TCIP    Next visit with pharmacy in PRN  ____________________________________________________________________    Spencer LELON Silversmith Sr. is a 74 y.o. 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    Other PMH significant for hypertension, T2DM, colon cancer s/p hemicolectomy and R hepatectomy for metastasis + chemotherapy (2007), BPH    Seen by pharmacy today for: medication management and  pill box assessment and adherence education.     CC:  Patient has no complaints today.     Vitals:    05/25/24 0945   BP: 149/73   Pulse: 75   Temp: 36.5 ??C (97.7 ??F)   SpO2: 99%         Allergies   Allergen Reactions    Cetuximab Anaphylaxis     Chest pain/rapid heart rate/ BP dropped    Enalapril       Angioedema      Venom-Honey Bee Anaphylaxis    Nsaids (Non-Steroidal Anti-Inflammatory Drug)      Interaction with transplant medications    Pollen Extracts Other (See Comments)     Nasal stuffiness, coughing, sneezing     Medications reviewed in EPIC medication station and updated today by the clinical pharmacist practitioner. Medication list includes revisions made during today???s encounter.    Outpatient Encounter Medications as of 05/25/2024   Medication Sig Dispense Refill    acetaminophen  (TYLENOL  EXTRA STRENGTH) 500 MG tablet Take 1-2 tablets (500-1,000 mg total) by mouth every six (6) hours as needed for pain. 180 tablet PRN    albuterol  HFA 90 mcg/actuation inhaler Inhale 2 puffs every four (4) hours as needed for wheezing or shortness of breath. 8.5 g 2    alendronate  (FOSAMAX ) 70 MG tablet Take 1 tablet (70 mg total) by mouth every seven (7) days. 12 tablet 3    amlodipine  (NORVASC ) 5 MG tablet Take 1 tablet (5 mg total) by mouth daily. 90 tablet 3    aspirin  (ECOTRIN) 81 MG tablet Take 1 tablet (81 mg total) by mouth daily. 90 tablet 3    atorvastatin  (LIPITOR) 40 MG tablet Take 1 tablet (40 mg total) by mouth daily. 90 tablet 3    azathioprine  (IMURAN ) 50 mg tablet Take 1 tablet (50 mg total) by mouth daily. 90 tablet 3    azithromycin  (ZITHROMAX ) 250 MG tablet Take 1 tablet (250 mg total) by mouth daily. 90 tablet 3    calcium  carbonate (CALCIUM  600) 1,500 mg (600 mg elem calcium ) tablet Take 1 tablet (600 mg of elem calcium  total) by mouth Two (2) times a day. 180 tablet 3    cholecalciferol , vitamin D3 25 mcg, 1,000 units,, 1,000 unit (25 mcg) tablet Take 1 tablet (25 mcg total) by mouth daily. 100 tablet 3    fluticasone  propion-salmeterol (ADVAIR  HFA) 115-21 mcg/actuation inhaler Inhale 2 puffs Two (2) times a day. 36 g 3    fluticasone  propionate (FLONASE ) 50 mcg/actuation nasal spray Use 2 sprays into each nostril daily. 16 g 11    magnesium  oxide (MAG-OX) 400 mg (241.3 mg elemental magnesium ) tablet Take 2 tablets (800 mg total) by mouth at bedtime. 180 tablet 3    metFORMIN  (GLUCOPHAGE ) 1000 MG tablet  Take 1 tablet (1,000 mg total) by mouth Two (2) times a day. 180 tablet 3    montelukast  (SINGULAIR ) 10 mg tablet Take 1 tablet (10 mg total) by mouth daily. 90 tablet 3    pantoprazole  (PROTONIX ) 40 MG tablet Take 1 tablet (40 mg total) by mouth daily. 90 tablet 3    predniSONE  (DELTASONE ) 5 MG tablet Take 1 tablet (5 mg total) by mouth daily. 90 tablet 3    sildenafiL , pulm.hypertension, (REVATIO ) 20 mg tablet Take 2-3 pills as needed (up to 60 mg) as needed for erectile dysfunction 60 tablet 3    SITagliptin  phosphate (JANUVIA ) 100 MG tablet Take 1 tablet (100 mg total) by mouth daily. 90 tablet 3    sulfamethoxazole -trimethoprim  (BACTRIM ) 400-80 mg per tablet Take 1 tablet (80 mg of trimethoprim  total) by mouth every Monday, Wednesday, and Friday. 36 tablet 3    tacrolimus  (PROGRAF ) 0.5 MG capsule Take 2 capsules (1 mg total) by mouth two (2) times a day. 360 capsule 3    valGANciclovir  (VALCYTE ) 450 mg tablet Take 1 tablet (450 mg total) by mouth daily. 90 tablet 3    [DISCONTINUED] amlodipine  (NORVASC ) 5 MG tablet Take 1 tablet (5 mg total) by mouth daily. 30 tablet 11    [DISCONTINUED] azathioprine  (IMURAN ) 50 mg tablet Take 1 tablet (50 mg total) by mouth daily. 90 tablet 3    [DISCONTINUED] blood sugar diagnostic (ON CALL EXPRESS TEST STRIP) Strp Test daily before all meals/snacks and once before bedtime. 100 each 11    [DISCONTINUED] blood-glucose meter kit Accu-Check meter and supplies 1 each 0    [DISCONTINUED] calcium  carbonate (CALCIUM  600) 1,500 mg (600 mg elem calcium ) tablet Take 1 tablet (600 mg of elem calcium  total) by mouth Two (2) times a day. 60 tablet 11    [DISCONTINUED] fluticasone  propion-salmeterol (ADVAIR  HFA) 115-21 mcg/actuation inhaler Inhale 2 puffs Two (2) times a day. 12 g 11    [DISCONTINUED] magnesium  oxide (MAG-OX) 400 mg (241.3 mg elemental magnesium ) tablet Take 2 tablets (800 mg total) by mouth at bedtime. 180 tablet 3    [DISCONTINUED] montelukast  (SINGULAIR ) 10 mg tablet Take 1 tablet (10 mg total) by mouth daily. 90 tablet 3    [DISCONTINUED] valGANciclovir  (VALCYTE ) 450 mg tablet Take 1 tablet (450 mg total) by mouth daily. 30 tablet 11     No facility-administered encounter medications on file as of 05/25/2024.     Immunosuppression:  Induction agent: basiliximab    Current immunosuppression:  tacrolimus  1mg  BID  Prograf  goal: 5-7 (lower goal given CKD, no signs of rejection, tolerating imuran )  Azathioprine  50 mg daily   changed from MMF 2/2 diarrhea; dose decreased from 100mg  to 75mg  on 08/01/20 given down-trending ANC and IgG   dose decreased to 50mg  on 10/03/20   prednisone  5 mg daily    Patient is tolerating immunosuppression well.    IMMUNOSUPPRESSION DRUG LEVELS:  Lab Results   Component Value Date    Tacrolimus , Trough 4.9 (L) 12/23/2023    Tacrolimus , Trough 7.0 12/02/2023    Tacrolimus , Trough 8.3 11/25/2023    Tacrolimus , Timed 5.6 05/25/2024    Tacrolimus , Timed 4.6 04/26/2024    Tacrolimus , Timed 4.8 03/21/2024     Tacrolimus  level is accurate 12 hour trough.    Graft function assessment via PFTs: stable  DSA: NTD, last checked 05/27/23  Biopsies to date: none  WBC/ANC:  wnl      Plan: Will maintain current immunosuppression pending today's tac level.  OI prophylaxis:   CMV Status: D+/ R-, high risk. CMV prophylaxis with valganciclovir  450 mg daily indefinitely. Negative VL 12/17/2022. T cell assay with low immunity 09/2022.   Estimated Creatinine Clearance: 50.1 mL/min (A) (based on SCr of 1.33 mg/dL (H)).  PCP: Prophylaxis with bactrim  SS 1 tab MWF indefinitely.  Fungal: Completed posaconazole  300 mg daily for prophylaxis x 3 months per protocol - COMPLETE (08/20/20)    Patient is tolerating infectious prophylaxis well.    Plan: continue to monitor. Consider repeat TCIP.     Infectious History:  Patient does not have a history of pathogenic infectious complications following lung transplant. 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 Previous antiinfective history use includes meropenem x7 days for broad spectrum gram negative and aspiration PNA coverage post-op per ICID.     Plan: Continue to monitor.    BOS/CLAD prophylaxis:  Current meds include: azithromycin  250 mg daily, montelukast  10 mg, and pantoprazole  40 mg daily, Advair  inhaler  Esophageal motility study on 07/23/20 showed abnormal distal esophagus exposure during recumbent posture and overall borderline normal. 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 Patient denies any reflux symptoms with every day dosing.     Plan: Continue to monitor.     CV Prophylaxis:   Aspirin : asa 81 mg daily  The 10-year ASCVD risk score (Arnett DK, et al., 2019) is: 43.2%  Statin: atorvastatin  40 mg daily - pt tolerated switch from pravastatin  well  Lipid panel 11/19/22: LDL of 46   Plan: Continue to monitor. Repeat LDL at next visit    BP/Edema: Today in clinic BP 129/79 (Goal <130/80)    Encounter vitals reported above.  Home BP ranges: 120s-130s/70s  Weights: stable  Meds include: amlodipine  5 mg daily   Plan: within goal. Continue to monitor.     Anemia:  H/H:   Lab Results   Component Value Date    HGB 14.0 05/25/2024     Lab Results   Component Value Date    HCT 42.0 05/25/2024     Iron  panel:  Lab Results   Component Value Date    IRON  19 (L) 03/21/2024    TIBC 407 03/21/2024    FERRITIN 7.3 (L) 03/21/2024     Lab Results   Component Value Date    Iron  Saturation (%) 5 (L) 03/21/2024     Prior anemia therapy: IV Ferrlecit (1/10-1/13), IV iron  sucrose (1000 mg in 14 day period) completed 09/15/21    Plan: continue to monitor. Repeat iron  panel.      DM:   Lab Results   Component Value Date    A1C 6.7 (H) 02/24/2024     Current meds include:  Metformin  1000 mg PO BID  Januvia  100 mg PO daily  Home BS log: FBG 100s-120s; PPBG: 140s - 150s  Hypoglycemia: no      Plan: Added on repeat Hgb A1c today, which is much improved. Encouraged continued lifestyle and exercise modifications.    Electrolytes: wnl  Current meds include: MagOx 800mg  QHS  Plan: continue    Bone health:  Patient currently on long-term steroid therapy.  Vitamin D Level: 53.5 (08/26/23). Goal > 30.   Last DEXA results (11/19/2022): +1.5% at hip, +2.1% femoral neck   Current meds include: vitamin D3 1000 units daily, calcium  carbonate 600 mg BID, alendronate  70 mg weekly    Plan: Continue current therapy. Repeat DEXA. Continue to monitor.    Women's/Men's Health:  Spencer LELON Silversmith  Sr. is a 74 y.o. male. Patient reports history of BPH. Reports that BPH symptoms are controlled.  Current meds include: None  -terazosin  3 mg PO nightly (increased 09/05/20 given increased BPH symptoms, held at last visit and recommended to stop by urology on 12/31/22)   Plan:  Continue to monitor.    Adherence:   Patient has good understanding of medications.  Patient does fill their own pill box on a regular basis at home. Wife had previously been filing for him, but he now handles it on his own for the most part.   Patient brought medication card: yes  Pill box: did not bring  Patient requested refills for the following meds: None  Corrections needed in Epic medication list: none    Plan: Provided basic adherence counseling/intervention    I spent a total of 30 minutes face to face with the patient delivering clinical care and providing education/counseling.    Patient was reviewed with Dr. Diana who were in agreement with the stated plan.    During this visit, the following was completed:   BG log data assessment  Labs ordered and evaluated  complex treatment plan >1 DS   Patient education was completed for 11-24 minutes     All questions/concerns were addressed to the patient's satisfaction.  __________________________________________  PATIENT SEEN AND EVALUATED BY:  Tawni ONEIDA Booker, CPP  Cardiothoracic Transplant Pharmacist

## 2024-05-25 NOTE — Progress Notes (Signed)
 In with Dr. Diana to see Winford LELON Silversmith Sr.. Doing well, PFTs stable.  No medication changes.  Return to clinic in 3 months. Questions answered, verbalized understanding.

## 2024-05-25 NOTE — Telephone Encounter (Signed)
 Called Spencer LELON Silversmith Sr. to discuss lab results. Tacrolimus  level was normal at 5.6 with a goal of 5-7. Medication regimen is currently 1 mg BID. No changes made, level within normal range.  dosing at this time to remain the same. Repeat labs scheduled monthly. Resulted labs reviewed. LMx1

## 2024-05-31 LAB — FSAB CLASS 1 ANTIBODY SPECIFICITY: HLA CLASS 1 ANTIBODY RESULT: NEGATIVE

## 2024-05-31 LAB — FSAB CLASS 2 ANTIBODY SPECIFICITY: HLA CL2 AB RESULT: NEGATIVE

## 2024-05-31 NOTE — Progress Notes (Signed)
 Spencer LELON Silversmith Sr. has been contacted in regards to their refill of tacrolimus  0.5 MG capsule (PROGRAF ). At this time, they have declined refill due to patient having 3 weeks of  doses remaining. Refill assessment call date has been updated per the patient's request.

## 2024-06-01 MED FILL — PREDNISONE 5 MG TABLET: ORAL | 90 days supply | Qty: 90 | Fill #3

## 2024-06-01 MED FILL — ATORVASTATIN 40 MG TABLET: ORAL | 90 days supply | Qty: 90 | Fill #1

## 2024-06-01 MED FILL — CHOLECALCIFEROL (VITAMIN D3) 25 MCG (1,000 UNIT) TABLET: ORAL | 100 days supply | Qty: 100 | Fill #2

## 2024-06-01 MED FILL — ASPIRIN 81 MG TABLET,DELAYED RELEASE: ORAL | 90 days supply | Qty: 90 | Fill #1

## 2024-06-03 LAB — HLA DS POST TRANSPLANT
ANTI-DONOR DRW #1 MFI: 0 MFI
ANTI-DONOR DRW #2 MFI: 0 MFI
ANTI-DONOR HLA-A #1 MFI: 0 MFI
ANTI-DONOR HLA-A #2 MFI: 0 MFI
ANTI-DONOR HLA-B #1 MFI: 0 MFI
ANTI-DONOR HLA-B #2 MFI: 0 MFI
ANTI-DONOR HLA-C #2 MFI: 0 MFI
ANTI-DONOR HLA-DP AG #1 MFI: 0 MFI
ANTI-DONOR HLA-DQB #1 MFI: 42 MFI
ANTI-DONOR HLA-DQB #2 MFI: 0 MFI
ANTI-DONOR HLA-DR #1 MFI: 0 MFI
ANTI-DONOR HLA-DR #2 MFI: 0 MFI

## 2024-06-12 MED ORDER — PREDNISONE 5 MG TABLET
ORAL_TABLET | Freq: Every day | ORAL | 3 refills | 90.00000 days | Status: CP
Start: 2024-06-12 — End: ?
  Filled 2024-06-13: qty 7, 7d supply, fill #0

## 2024-06-12 NOTE — Telephone Encounter (Signed)
 Pt request for RX Refill

## 2024-06-13 MED ORDER — PREDNISONE 5 MG TABLET
ORAL_TABLET | Freq: Every day | ORAL | 0 refills | 7.00000 days | Status: CP
Start: 2024-06-13 — End: 2024-06-20

## 2024-06-13 MED FILL — TACROLIMUS 0.5 MG CAPSULE, IMMEDIATE-RELEASE: ORAL | 90 days supply | Qty: 360 | Fill #2
